# Patient Record
Sex: Female | Born: 1950 | Race: White | Hispanic: No | Smoking: Current every day smoker
Health system: Southern US, Community
[De-identification: ages and names within clinical notes are randomized; demographics above are authoritative.]

## PROBLEM LIST (undated history)

## (undated) DIAGNOSIS — J449 Chronic obstructive pulmonary disease, unspecified: Secondary | ICD-10-CM

## (undated) DIAGNOSIS — E781 Pure hyperglyceridemia: Secondary | ICD-10-CM

## (undated) DIAGNOSIS — I714 Abdominal aortic aneurysm, without rupture, unspecified: Secondary | ICD-10-CM

## (undated) DIAGNOSIS — I5181 Takotsubo syndrome: Secondary | ICD-10-CM

## (undated) DIAGNOSIS — R0600 Dyspnea, unspecified: Secondary | ICD-10-CM

## (undated) HISTORY — DX: Pure hyperglyceridemia: E78.1

## (undated) HISTORY — DX: Abdominal aortic aneurysm, without rupture, unspecified: I71.40

## (undated) HISTORY — DX: Chronic obstructive pulmonary disease, unspecified: J44.9

## (undated) HISTORY — DX: Abdominal aortic aneurysm, without rupture: I71.4

## (undated) HISTORY — DX: Takotsubo syndrome: I51.81

## (undated) HISTORY — PX: TOE SURGERY: SHX1073

---

## 2005-03-13 HISTORY — PX: OTHER SURGICAL HISTORY: SHX169

## 2013-08-25 DIAGNOSIS — F17200 Nicotine dependence, unspecified, uncomplicated: Secondary | ICD-10-CM | POA: Diagnosis not present

## 2013-08-25 DIAGNOSIS — Z0000 Encounter for general adult medical examination without abnormal findings: Secondary | ICD-10-CM | POA: Diagnosis not present

## 2013-08-25 DIAGNOSIS — J441 Chronic obstructive pulmonary disease with (acute) exacerbation: Secondary | ICD-10-CM | POA: Diagnosis not present

## 2013-08-25 DIAGNOSIS — E781 Pure hyperglyceridemia: Secondary | ICD-10-CM | POA: Diagnosis not present

## 2013-08-25 DIAGNOSIS — J449 Chronic obstructive pulmonary disease, unspecified: Secondary | ICD-10-CM | POA: Diagnosis not present

## 2013-11-24 DIAGNOSIS — J449 Chronic obstructive pulmonary disease, unspecified: Secondary | ICD-10-CM | POA: Diagnosis not present

## 2013-11-24 DIAGNOSIS — E781 Pure hyperglyceridemia: Secondary | ICD-10-CM | POA: Diagnosis not present

## 2013-12-16 DIAGNOSIS — Z720 Tobacco use: Secondary | ICD-10-CM | POA: Diagnosis not present

## 2013-12-16 DIAGNOSIS — J410 Simple chronic bronchitis: Secondary | ICD-10-CM | POA: Diagnosis not present

## 2013-12-16 DIAGNOSIS — F17210 Nicotine dependence, cigarettes, uncomplicated: Secondary | ICD-10-CM | POA: Diagnosis not present

## 2014-06-01 DIAGNOSIS — J449 Chronic obstructive pulmonary disease, unspecified: Secondary | ICD-10-CM | POA: Diagnosis not present

## 2014-06-01 DIAGNOSIS — E781 Pure hyperglyceridemia: Secondary | ICD-10-CM | POA: Diagnosis not present

## 2014-06-01 DIAGNOSIS — Z681 Body mass index (BMI) 19 or less, adult: Secondary | ICD-10-CM | POA: Diagnosis not present

## 2014-06-01 DIAGNOSIS — Z79899 Other long term (current) drug therapy: Secondary | ICD-10-CM | POA: Diagnosis not present

## 2014-12-02 DIAGNOSIS — E781 Pure hyperglyceridemia: Secondary | ICD-10-CM | POA: Diagnosis not present

## 2014-12-02 DIAGNOSIS — R636 Underweight: Secondary | ICD-10-CM | POA: Diagnosis not present

## 2014-12-02 DIAGNOSIS — J449 Chronic obstructive pulmonary disease, unspecified: Secondary | ICD-10-CM | POA: Diagnosis not present

## 2014-12-02 DIAGNOSIS — R413 Other amnesia: Secondary | ICD-10-CM | POA: Diagnosis not present

## 2014-12-02 DIAGNOSIS — Z1389 Encounter for screening for other disorder: Secondary | ICD-10-CM | POA: Diagnosis not present

## 2015-02-15 DIAGNOSIS — Z23 Encounter for immunization: Secondary | ICD-10-CM | POA: Diagnosis not present

## 2015-02-15 DIAGNOSIS — E781 Pure hyperglyceridemia: Secondary | ICD-10-CM | POA: Diagnosis not present

## 2015-02-15 DIAGNOSIS — R413 Other amnesia: Secondary | ICD-10-CM | POA: Diagnosis not present

## 2015-02-15 DIAGNOSIS — Z87891 Personal history of nicotine dependence: Secondary | ICD-10-CM | POA: Diagnosis not present

## 2015-02-15 DIAGNOSIS — R636 Underweight: Secondary | ICD-10-CM | POA: Diagnosis not present

## 2015-02-15 DIAGNOSIS — J449 Chronic obstructive pulmonary disease, unspecified: Secondary | ICD-10-CM | POA: Diagnosis not present

## 2015-02-15 DIAGNOSIS — Z720 Tobacco use: Secondary | ICD-10-CM | POA: Diagnosis not present

## 2015-02-15 DIAGNOSIS — Z9181 History of falling: Secondary | ICD-10-CM | POA: Diagnosis not present

## 2015-02-15 DIAGNOSIS — Z681 Body mass index (BMI) 19 or less, adult: Secondary | ICD-10-CM | POA: Diagnosis not present

## 2015-02-15 DIAGNOSIS — Z0000 Encounter for general adult medical examination without abnormal findings: Secondary | ICD-10-CM | POA: Diagnosis not present

## 2015-02-17 ENCOUNTER — Encounter: Payer: Self-pay | Admitting: Psychology

## 2015-02-17 ENCOUNTER — Ambulatory Visit: Payer: Medicare Other | Attending: Psychology | Admitting: Psychology

## 2015-02-17 DIAGNOSIS — F32A Depression, unspecified: Secondary | ICD-10-CM | POA: Insufficient documentation

## 2015-02-17 DIAGNOSIS — F329 Major depressive disorder, single episode, unspecified: Secondary | ICD-10-CM

## 2015-02-17 DIAGNOSIS — G3184 Mild cognitive impairment, so stated: Secondary | ICD-10-CM

## 2015-02-17 HISTORY — DX: Mild cognitive impairment of uncertain or unknown etiology: G31.84

## 2015-02-17 HISTORY — DX: Major depressive disorder, single episode, unspecified: F32.9

## 2015-02-17 HISTORY — DX: Depression, unspecified: F32.A

## 2015-02-17 NOTE — Progress Notes (Signed)
[facility]  [address]   Telephone [phone] [address] Fax [phone]) [phone] [place], [place] [address]  Initial Contact Note  Name:  Alexandra Mays Date of Birth; 08/30/1950 MRN:  [id number] Date:  02/17/2015  Alexandra Mays is an 64 y.o. female who was referred for neuropsychological evaluation by Tanna FurryBrittany Hout, PA-C of [facility] due to concerns about memory loss.   A total of 4 hours was spent today reviewing medical records, interviewing (CPT [id number]) Alexandra Mays and her daughter, administering and scoring neurocognitive tests, and preparing a written report (CPTs [phone] & [phone]).  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   A full report will follow once the results and recomemdations are discussed with her. Her next appointment is scheduled for 02/24/15.    Gladstone PihMichael F. Maruice Pieroni, Ph.D Licensed Psychologist 02/17/2015

## 2015-02-24 ENCOUNTER — Encounter: Payer: Self-pay | Admitting: Psychology

## 2015-02-24 ENCOUNTER — Ambulatory Visit (INDEPENDENT_AMBULATORY_CARE_PROVIDER_SITE_OTHER): Payer: Medicare Other | Admitting: Psychology

## 2015-02-24 DIAGNOSIS — G3184 Mild cognitive impairment, so stated: Secondary | ICD-10-CM

## 2015-02-24 DIAGNOSIS — F32A Depression, unspecified: Secondary | ICD-10-CM

## 2015-02-24 DIAGNOSIS — F329 Major depressive disorder, single episode, unspecified: Secondary | ICD-10-CM

## 2015-02-24 NOTE — Progress Notes (Addendum)
[facility]  [address]   Telephone [phone] [address] Fax [phone]) [phone] [place], [place] [address]   NEUROPSYCHOLOGICAL EVALUATION  *CONFIDENTIAL* This report should not be released without the consent of the client  Name:   Alexandra LeanPatricia A. Mays  Date of Birth:  Apr 07, 2050 [facility] MR#:  [id number] Dates of Evaluation: 02/17/15 & 02/24/15  Reason for Referral Alexandra HashimotoPatricia Dennie Mays(Pat) Alexandra Mays is a 64 year-old right-handed woman who was referred for neuropsychological evaluation by Tanna FurryBrittany Hout PA-C of [facility] as prompted by concerns about her memory.   Sources of Information Records from [name] and electronic medical records from [facility] were reviewed. Ms. Alexandra Mays and her granddaughter, Ms. Alexandra Mays, were interviewed.   Chief Complaints Ms. Alexandra Mays was described by her granddaughter (with whom she lives) has having shown memory problems over the past year that have gradually worsened. Examples included difficulties with recent memory (e.g., unable to recall more than one item at a grocery store, forgetting details of recent conversations), problems with word finding, getting lost while driving and a tendency to conflate details of different events that occurred many years ago. She stated that she has been initiating and completing household chores though in a less precise way than typical for her. She reported that her grandmother's handwriting has appeared less legible. She described her grandmother as having become "grumpy" within the past year. She has shown little interest in living her home and has been avoidant of social gatherings. She has not seemed confused nor exhibited any unusual or unsafe behavior. She continues to trust her with babysitting her children.  There was no report of any events, changes in health, life stressors or social changes that preceded or were coincident to the onset of her memory difficulties.   Ms.  Alexandra Mays stated her belief that her memory is normal for her age. She reported that she has had no problems with preparing meals, taking her medications as prescribed or paying her bills. She did not report any recent changes in her health. She denied problems with eye-hand coordination, balance, limb strength, gait, sleep, daytime alertness, vision, hearing, swallowing, speech, smell, taste or appetite. She did not report being in any pain. When asked about her mood, she described herself as dissatisfied in life, in part due to a sense of purposelessness. She stated that there is nothing that she looks forward to other than watching [employer] and taking care of her grandchildren. She would not consider herself to be sad or anxious. She stated that she spends most of her time watching television. She agreed that she tends to avoid social gatherings because "I feel like I'm intruding". She denied having had suicidal thoughts but stated that most days she does not care if she would die. She denied experiencing hallucinations or delusional ideas.  Background Ms. Alexandra Mays moved in with her granddaughter in 2013 at her granddaughter's request, primarily to help watch her grandchildren. Ms. Alexandra Mays had been living alone since the death of her husband in 2004. She has three children and six grandchildren.   Her past medical history was notable for chronic obstructive pulmonary disease, hyperlipidemia and a myocardial infarction in 2007 (according to Ms. Alexandra Mays). She reported no history of head injury, loss of consciousness, seizure activity, stroke-like symptoms or exposure to toxic chemicals.  Her current medications consist of daily use of inhalers for COPD.  She reported no history of alcohol abuse or use of illicit drugs. Despite having COPD, she  continues to smoke about  packs of cigarettes per day. She has been a smoker since the age of 1.  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  She reported that she dropped out of high school in the eleventh grade to get married. She described herself as a good student without problems with attention or learning.  She reported no prior history of mental health contacts or use of psychiatric medication.  She reported no family history of memory disorder or dementia though her mother died from a stroke when only in her thirties.   Observations She appeared as an appropriately dressed and groomed slender woman in no apparent physical distress. She interacted in a consistently pleasant and cooperative manner. She spoke in a normal tone of voice, maintained good eye contact and responded to all questions. Her affect appeared constricted in range. She did not display signs of emotional distress. Her thought processes were coherent and organized without loose associations, verbal perseverations or flight of ideas. Her thought content was devoid of unusual or bizarre ideas.  Evaluation Procedures In addition to review of medical records and interviews, the following tests or questionnaires were administered:  Animal Naming Test [employer] Clock Drawing Test Controlled Oral Word Association Test Geriatric Depression Scale Rey Complex Figure: copy Trail Making A & B Wechsler Adult Intelligence Scale-IV:  Clinical cytogeneticist, Coding, Digit Span, Matrix Reasoning & Similarities   Wechsler Memory Scale-IV Older Adult Battery Wide Range Achievement Test-4: Word Reading [place] Card Sorting Test  Assessment Results Validity & Interpretative Considerations  Test results were deemed to represent a valid measure of her current cognitive functioning.  She presented as alert, calm and cooperative. She did not report or display problems with vision (she wore her eyeglasses), hearing or motor control. There were no signs of inadequate effort.    Her baseline intellectual potential was estimated to fall  within the Average range based on demographic factors coupled with a measure of word reading (Wide Range Achievement Test-4) that represents an over-learned verbal skill relatively resistant to the effects of neurological disorder or injury.  Her test scores were corrected to reflect norms for her age and, whenever possible, her gender and educational level (i.e., 11 years). A listing of test results can be found at the end of this report.  Orientation She was oriented to the month, year and day of the week but not to the date. She was oriented to self and place.    Attentional & Executive Functions Her speed to transcribe symbols to match digits using a key (Wechsler Adult Intelligence Scale-IV (WAIS-IV) Coding) fell within the Average range. Her speed to draw lines connecting numbers randomly arrayed on a page in numerical sequence (Trials A) fell within the Low Average range.   Her speed on a complex attentional task that required mental tracking and set shifting (Trails B) in order to complete an ascending and alternating visual sequence of numbers and letters was within the Average range.   Her capacity to hold and manipulate information in working memory was within normal expectations on tasks that required her to mentally rearranging digit sequences in reverse or in ascending numerical order (WAIS-IV Digit Span) or immediately recognize symbols in left to right order (Wechsler Memory Scale-IV (WMS-IV Symbol Span).    Her abilities to generate words to designated letters (Controlled Oral Word Association Test) or name members of a category Building services engineer Test)  were within the Low Average range.  Her performances on measures of verbal reasoning (WAIS-IV Similarities) and nonverbal reasoning (WAIS-IV Matrix Reasoning) fell within the Average range.  She performed mostly within the expected range on a test of nonverbal problem-solving and conceptual flexibility that required inferring logical  ways to sort geometric designs on the basis of ongoing feedback [employer]). On the positive side, she quickly inferred the initial sorting principle, deduced all possible sorting ideas, made an average number of errors and adaptively shifted to a new strategy as necessary. In contrast, she failed to maintain set (i.e., deviated from a correct sorting strategy) more than an average number of times.  Learning & Memory On the WMS-IV, her Immediate Memory Index (IMI), a composite measure of her ability to recall verbal and visual information immediately after the stimuli was presented, fell within the Low Average range at the 13th percentile. Her immediate memory subtest scores varied from the 9th to 25th percentiles. Her Delayed Memory Index (DMI), a combined measure of her ability to recall verbal and visual information after a 20 to 30 minute delay, fell within the impaired range at below the 1st percentile. Her DMI was much lower than expected given her IMI, as she demonstrated abnormal forgetting of previously learned auditory and visual information over the course of the delay interval. For example she recalled only 15% of narrative passages and 7% of figural designs. A recognition format (i.e., multiple choices or yes/no questions) did somewhat aid her recall of figural designs but did not help her recall auditory information.    Language Her ability to name drawings of objects to confrontation [facility] [employer]) was within the High Average range. As noted above, a measure of her letter fluency was within the Low Average range. A measure of word reading (Wide Range Achievement Test-4) fell within the Average range.   Visual-Spatial Organization & Visual-Construction  There were no signs of spatial inattention or problems with visual recognition. Her ability to assemble two-dimensional block designs from models Counsellor) was within the Average range. Her copy of a  spatially-complex geometric design (Rey Complex Figure) was recognizable though marred by a number of distortions, omissions or misplacements of internal design features. Her clock drawing was normal.  Emotional Status Her score of 15/30 on the Geriatric Depression Scale fell within the mild range. She endorsed symptoms reflective of discontent, social withdrawal, hopelessness, helplessness, worthlessness and low energy level.    Summary & Conclusions Alexandra Mays is a 64 year-old woman who was described by her granddaughter (with whom she lives) as having shown gradually worsening memory problems over the past year. Her memory problems included difficulties with recent memory, driving navigation and a tendency to conflate details of past events. Ms. Nakama stated her belief her memory is normal for her age.   Neuropsychological testing identified a serious memory deficit, primarily characterized by abnormal forgetting after a delay. In addition, her problem solving ability was somewhat reduced by difficulty keeping track of her ideas in working memory. Her performance on a measure of semantic fluency was lower than expected at barely within the Low Average range. On the positive side, she performed within normal expectations on tests of focused visual attention, working memory span, Nature conservation officer, visual-spatial organization and abstract reasoning.   Assessment of her psychological functioning indicated a depressive disorder characterized by a sense of purposelessness in life, lowered sense of self-worth, loss of interests, social withdrawal and passive wishes to die. It  is likely that she has felt this way for at least the past few years.  In conclusion, the main question is whether her cognitive decline is secondary to depression or if her depression is co-morbid with an incipient early-onset dementia process. The fact that her auditory memory was deficient at the stage of storage rather than  retrieval would be less typical of the effects of depression on memory.   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 It is advised to start her on antidepressant medication, if medically appropriate, to see whether her mood and cognitive functioning might subsequently improve. She was not interested in seeking psychological counseling.  2. Neuroradiogical studies are recommended for clinical correlation. Referral to neurology could be considered for further work-up.  3. It would be prudent for her granddaughter to manage Ms. Hassan finances for the time being.  4. She was encouraged to stop smoking though as might be expected she has heard this many times from many people. She was not interested in pursuing smoking cessation strategies.   5. The current test results comprise a baseline of her cognitive functioning. A repeat neuropsychological evaluation should be considered in one year to eighteen months (or sooner if clinically indicated) to track interval changes, if any, in her cognitive or emotional functioning.   The conclusions and recommendations from this evaluation were discussed with Ms. Harral and her granddaughter on 02/24/15. Ms. Esquivias reactions to this feedback vacillated between acceptance and denial of her mood and memory problems.   I have appreciated the opportunity to evaluate Ms. Oddo Please feel free to contact me with any comments or questions.      ______________________ Gladstone Pih, Ph.D Licensed Psychologist       Copy to: Tanna Furry, PA-C       ADDENDUM-NEUROPSYCHOLOGICAL TEST RESULTS  Her test scores were corrected to reflect norms for her age and, whenever possible, her gender and educational level (i.e., 11 years).   Animal Naming Test Score= 12   9th (adjusted for age, gender and educational level)   [place] Naming Test Score=57/60  84th (adjusted for age, gender and educational level)   Controlled Oral Word Association Test Score=  27 words/1 repetition 18th (adjusted for age, gender and educational level)   Rey Complex Figure: copy       Score= 28/36  Inattentiveness to details, distortions, misplacements   Trails A Score= 56s 02e 13th (adjusted for age, gender and educational level)  Trails B Score= 84s 1e 70th (adjusted for age, gender and educational level)   Wechsler Adult Intelligence Scale-IV   Subtest Scaled Score Percentile  Block Design   9 37th      Similarities   9 37th    Digit Span  Forward               Backward               Sequencing 84th    63rd     95th   50th         Matrix Reasoning   8 25th     Coding   10 50th        Wechsler Memory Scale-IV Older Adult Battery  Index Index Score Percentile  Immediate Memory  83 13th     Auditory Memory  72   3rd        Visual Memory  76   5th  Delayed Memory  58 <1st        Symbol Span Scaled score= 9 37th       Wide Range Achievement Test-4 Subtest  Raw score Standard score Percentile  Word Reading 59/70   99   47th    Wisconsin Card Sorting Test  Total errors=  35   58th (adjusted for age and education)  Perseverative errors=  17   66th       Categories=    6 >16th     Trials to first category=  11 >16th    Failure to maintain set   2 11th - 16th    Learning to learn=   0.15 >16th

## 2015-03-16 DIAGNOSIS — J432 Centrilobular emphysema: Secondary | ICD-10-CM | POA: Diagnosis not present

## 2015-03-16 DIAGNOSIS — Z122 Encounter for screening for malignant neoplasm of respiratory organs: Secondary | ICD-10-CM | POA: Diagnosis not present

## 2015-03-16 DIAGNOSIS — F17210 Nicotine dependence, cigarettes, uncomplicated: Secondary | ICD-10-CM | POA: Diagnosis not present

## 2015-03-16 DIAGNOSIS — R918 Other nonspecific abnormal finding of lung field: Secondary | ICD-10-CM | POA: Diagnosis not present

## 2015-03-16 DIAGNOSIS — I2510 Atherosclerotic heart disease of native coronary artery without angina pectoris: Secondary | ICD-10-CM | POA: Diagnosis not present

## 2015-03-16 DIAGNOSIS — J438 Other emphysema: Secondary | ICD-10-CM | POA: Diagnosis not present

## 2015-03-16 DIAGNOSIS — R928 Other abnormal and inconclusive findings on diagnostic imaging of breast: Secondary | ICD-10-CM | POA: Diagnosis not present

## 2015-03-16 DIAGNOSIS — F17219 Nicotine dependence, cigarettes, with unspecified nicotine-induced disorders: Secondary | ICD-10-CM | POA: Diagnosis not present

## 2015-03-16 DIAGNOSIS — Z1231 Encounter for screening mammogram for malignant neoplasm of breast: Secondary | ICD-10-CM | POA: Diagnosis not present

## 2015-03-29 DIAGNOSIS — E785 Hyperlipidemia, unspecified: Secondary | ICD-10-CM | POA: Insufficient documentation

## 2015-03-29 DIAGNOSIS — F17210 Nicotine dependence, cigarettes, uncomplicated: Secondary | ICD-10-CM

## 2015-03-29 DIAGNOSIS — I25119 Atherosclerotic heart disease of native coronary artery with unspecified angina pectoris: Secondary | ICD-10-CM

## 2015-03-29 HISTORY — DX: Atherosclerotic heart disease of native coronary artery with unspecified angina pectoris: I25.119

## 2015-03-29 HISTORY — DX: Nicotine dependence, cigarettes, uncomplicated: F17.210

## 2015-03-29 HISTORY — DX: Hyperlipidemia, unspecified: E78.5

## 2015-03-31 DIAGNOSIS — N6002 Solitary cyst of left breast: Secondary | ICD-10-CM | POA: Diagnosis not present

## 2015-03-31 DIAGNOSIS — R928 Other abnormal and inconclusive findings on diagnostic imaging of breast: Secondary | ICD-10-CM | POA: Diagnosis not present

## 2015-03-31 DIAGNOSIS — N63 Unspecified lump in breast: Secondary | ICD-10-CM | POA: Diagnosis not present

## 2015-04-01 DIAGNOSIS — I25119 Atherosclerotic heart disease of native coronary artery with unspecified angina pectoris: Secondary | ICD-10-CM | POA: Diagnosis not present

## 2015-04-01 DIAGNOSIS — E785 Hyperlipidemia, unspecified: Secondary | ICD-10-CM | POA: Diagnosis not present

## 2015-04-01 DIAGNOSIS — F17210 Nicotine dependence, cigarettes, uncomplicated: Secondary | ICD-10-CM | POA: Diagnosis not present

## 2015-04-06 DIAGNOSIS — E785 Hyperlipidemia, unspecified: Secondary | ICD-10-CM | POA: Diagnosis not present

## 2015-04-06 DIAGNOSIS — I25119 Atherosclerotic heart disease of native coronary artery with unspecified angina pectoris: Secondary | ICD-10-CM | POA: Diagnosis not present

## 2015-04-06 DIAGNOSIS — I714 Abdominal aortic aneurysm, without rupture: Secondary | ICD-10-CM | POA: Diagnosis not present

## 2015-04-06 DIAGNOSIS — R109 Unspecified abdominal pain: Secondary | ICD-10-CM | POA: Diagnosis not present

## 2015-04-06 DIAGNOSIS — F17210 Nicotine dependence, cigarettes, uncomplicated: Secondary | ICD-10-CM | POA: Diagnosis not present

## 2015-04-12 ENCOUNTER — Encounter: Payer: Self-pay | Admitting: Vascular Surgery

## 2015-04-13 DIAGNOSIS — E785 Hyperlipidemia, unspecified: Secondary | ICD-10-CM | POA: Diagnosis not present

## 2015-04-13 DIAGNOSIS — I25119 Atherosclerotic heart disease of native coronary artery with unspecified angina pectoris: Secondary | ICD-10-CM | POA: Diagnosis not present

## 2015-04-13 DIAGNOSIS — F17210 Nicotine dependence, cigarettes, uncomplicated: Secondary | ICD-10-CM | POA: Diagnosis not present

## 2015-04-16 ENCOUNTER — Encounter: Payer: Self-pay | Admitting: Vascular Surgery

## 2015-04-21 ENCOUNTER — Ambulatory Visit (INDEPENDENT_AMBULATORY_CARE_PROVIDER_SITE_OTHER): Payer: Medicare Other | Admitting: Vascular Surgery

## 2015-04-21 ENCOUNTER — Encounter: Payer: Self-pay | Admitting: Vascular Surgery

## 2015-04-21 VITALS — BP 138/73 | HR 70 | Temp 96.6°F | Resp 14 | Ht 63.0 in | Wt 97.0 lb

## 2015-04-21 DIAGNOSIS — I714 Abdominal aortic aneurysm, without rupture, unspecified: Secondary | ICD-10-CM

## 2015-04-21 DIAGNOSIS — F17210 Nicotine dependence, cigarettes, uncomplicated: Secondary | ICD-10-CM

## 2015-04-21 HISTORY — DX: Abdominal aortic aneurysm, without rupture, unspecified: I71.40

## 2015-04-21 HISTORY — DX: Abdominal aortic aneurysm, without rupture: I71.4

## 2015-04-21 NOTE — Progress Notes (Signed)
[facility] [place] HISTORY AND PHYSICAL   Referring Physician: Tanna Furry PA-c History of Present Illness:  Patient is a 65 y.o. female who presents for evaluation of asymptomatic abdominal aortic aneurysm. The patient recently had an ultrasound of her aorta after a pulsatile mass was found on exam by Dr. Tomie China. She denies any family history of abdominal aortic aneurysm. She denies any back pain or abdominal pain. She currently smokes 1 pack cigarettes per day but is trying to quit. Other medical problems include hyperlipidemia and COPD. She does have a chronic cough. Patient's history does list that she has had coronary stenting in the past.  Past Medical History  Diagnosis Date  . COPD (chronic obstructive pulmonary disease) (HCC)   . Pure hyperglyceridemia   . AAA (abdominal aortic aneurysm) [facility]     Past Surgical History  Procedure Laterality Date  . Cardiac stenting  2007  . Neck surgery      Social History Social History  Substance Use Topics  . Smoking status: Current Every Day Smoker -- 1.00 packs/day for 50 years    Types: Cigarettes  . Smokeless tobacco: Never Used  . Alcohol Use: No    Family History Family History  Problem Relation Age of Onset  . Stroke Mother     Allergies  No Known Allergies   Current Outpatient Prescriptions  Medication Sig Dispense Refill  . albuterol (PROVENTIL HFA;VENTOLIN HFA) 108 (90 Base) MCG/ACT inhaler Inhale 2 puffs into the lungs every 6 (six) hours as needed for wheezing or shortness of breath.    . Fluticasone Furoate-Vilanterol (BREO ELLIPTA) 100-25 MCG/INH AEPB Inhale into the lungs daily.    . simvastatin (ZOCOR) 20 MG tablet Take 20 mg by mouth daily.    . Tiotropium Bromide Monohydrate (SPIRIVA RESPIMAT) 2.5 MCG/ACT AERS Inhale 2 puffs into the lungs daily.    Marland Kitchen albuterol (PROAIR HFA) 108 (90 Base) MCG/ACT inhaler Inhale into the lungs.    . Fluticasone Furoate-Vilanterol 100-25 MCG/INH AEPB  Inhale into the lungs.    . simvastatin (ZOCOR) 20 MG tablet Take 20 mg by mouth.    . Tiotropium Bromide Monohydrate (SPIRIVA RESPIMAT) 2.5 MCG/ACT AERS Inhale into the lungs.     No current facility-administered medications for this visit.    ROS:   General:  No weight loss, Fever, chills  HEENT: No recent headaches, no nasal bleeding, no visual changes, no sore throat  Neurologic: No dizziness, blackouts, seizures. No recent symptoms of stroke or mini- stroke. No recent episodes of slurred speech, or temporary blindness.  Cardiac: No recent episodes of chest pain/pressure, no shortness of breath at rest.  + shortness of breath with exertion.  Denies history of atrial fibrillation or irregular heartbeat  Vascular: No history of rest pain in feet.  No history of claudication.  No history of non-healing ulcer, No history of DVT   Pulmonary: No home oxygen, + productive cough, no hemoptysis,  No asthma or wheezing  Musculoskeletal:   Arthritis,  Low back pain,   Joint pain  Hematologic:No history of hypercoagulable state.  No history of easy bleeding.  No history of anemia  Gastrointestinal: No hematochezia or melena,  No gastroesophageal reflux, no trouble swallowing  Urinary:  chronic Kidney disease,  on HD -  MWF or  TTHS,  Burning with urination,  Frequent urination,  Difficulty urinating;   Skin: No rashes  Psychological: + history of anxiety, +  history of depression   Physical Examination  Filed Vitals:   04/21/15 1033  BP: 138/73  Pulse: 70  Temp: 96.6 F (35.9 C)  Resp: 14  Height:  (1.6 m)  Weight: 97 lb (43.999 kg)  SpO2: 99%    Body mass index is 17.19 kg/(m^2).  General:  Alert and oriented, no acute distress HEENT: Normal Neck: No bruit or JVD Pulmonary: Clear to auscultation bilaterally Cardiac: Regular Rate and Rhythm without murmur Abdomen: Soft, non-tender, non-distended, pulsatile mass in the epigastrium just  to the left of midline nontender Skin: No rash Extremity Pulses:  2+ radial, brachial, femoral, dorsalis pedis, posterior tibial pulses bilaterally Musculoskeletal: No deformity or edema  Neurologic: Upper and lower extremity motor 5/5 and symmetric  DATA:  Ultrasound report dated 04/06/2015 from [facility] radiology is reviewed. This shows a 4.7 x 4.7 cm abdominal aortic aneurysm. There was no iliac artery component listed.   ASSESSMENT:  Asymptomatic 4.7 cm abdominal aortic aneurysm. Pathophysiology of aneurysms was discussed with the patient and her granddaughter today. I also discussed with them that she has any severe abdominal or back pain she should let the emergency room no she has an abdominal aortic aneurysm. Risk of rupture for an aneurysm less than 5-5-1/2 cm in diameter is less than 0.5% per year. Usually in this case we get surveillance ultrasound every 6 months unless the aneurysm begins to grow then we would consider repair at that point.   PLAN:  Patient will follow-up with me with a repeat aortic ultrasound in 6 months. We'll try to quit smoking. Greater than 3 minutes today spent regarding smoking cessation counseling.  Fabienne Bruns, MD [facility] [place] Office: [phone] Pager: [phone]

## 2015-04-21 NOTE — Addendum Note (Signed)
Addended by: Adria Dill L on: 04/21/2015 06:05 PM   Modules accepted: Orders

## 2015-06-01 DIAGNOSIS — E785 Hyperlipidemia, unspecified: Secondary | ICD-10-CM | POA: Diagnosis not present

## 2015-06-01 DIAGNOSIS — Z681 Body mass index (BMI) 19 or less, adult: Secondary | ICD-10-CM | POA: Diagnosis not present

## 2015-06-01 DIAGNOSIS — J449 Chronic obstructive pulmonary disease, unspecified: Secondary | ICD-10-CM | POA: Diagnosis not present

## 2015-06-01 DIAGNOSIS — R413 Other amnesia: Secondary | ICD-10-CM | POA: Diagnosis not present

## 2015-06-03 DIAGNOSIS — R413 Other amnesia: Secondary | ICD-10-CM | POA: Diagnosis not present

## 2015-06-03 DIAGNOSIS — R930 Abnormal findings on diagnostic imaging of skull and head, not elsewhere classified: Secondary | ICD-10-CM | POA: Diagnosis not present

## 2015-06-16 DIAGNOSIS — R918 Other nonspecific abnormal finding of lung field: Secondary | ICD-10-CM | POA: Diagnosis not present

## 2015-06-16 DIAGNOSIS — F17200 Nicotine dependence, unspecified, uncomplicated: Secondary | ICD-10-CM | POA: Diagnosis not present

## 2015-06-16 DIAGNOSIS — Z87891 Personal history of nicotine dependence: Secondary | ICD-10-CM | POA: Diagnosis not present

## 2015-06-16 DIAGNOSIS — F17210 Nicotine dependence, cigarettes, uncomplicated: Secondary | ICD-10-CM | POA: Diagnosis not present

## 2015-07-21 DIAGNOSIS — F329 Major depressive disorder, single episode, unspecified: Secondary | ICD-10-CM | POA: Diagnosis not present

## 2015-07-21 DIAGNOSIS — R413 Other amnesia: Secondary | ICD-10-CM | POA: Diagnosis not present

## 2015-10-21 ENCOUNTER — Ambulatory Visit: Payer: Medicare Other | Admitting: Vascular Surgery

## 2015-10-21 ENCOUNTER — Other Ambulatory Visit (HOSPITAL_COMMUNITY): Payer: Medicare Other

## 2015-11-26 ENCOUNTER — Encounter: Payer: Self-pay | Admitting: Vascular Surgery

## 2015-12-02 ENCOUNTER — Ambulatory Visit (HOSPITAL_COMMUNITY)
Admission: RE | Admit: 2015-12-02 | Discharge: 2015-12-02 | Disposition: A | Discharge status: Home / Self Care | Payer: Medicare Other | Source: Ambulatory Visit | Attending: Vascular Surgery | Admitting: Vascular Surgery

## 2015-12-02 ENCOUNTER — Ambulatory Visit: Payer: Medicare Other | Admitting: Vascular Surgery

## 2015-12-02 ENCOUNTER — Other Ambulatory Visit (HOSPITAL_COMMUNITY): Payer: Medicare Other

## 2015-12-02 ENCOUNTER — Ambulatory Visit (INDEPENDENT_AMBULATORY_CARE_PROVIDER_SITE_OTHER): Payer: Medicare Other | Admitting: Family

## 2015-12-02 ENCOUNTER — Encounter: Payer: Self-pay | Admitting: Family

## 2015-12-02 VITALS — BP 119/71 | HR 80 | Temp 96.8°F | Resp 12 | Ht 64.0 in | Wt 96.1 lb

## 2015-12-02 DIAGNOSIS — I714 Abdominal aortic aneurysm, without rupture, unspecified: Secondary | ICD-10-CM

## 2015-12-02 DIAGNOSIS — Z720 Tobacco use: Secondary | ICD-10-CM

## 2015-12-02 DIAGNOSIS — F17200 Nicotine dependence, unspecified, uncomplicated: Secondary | ICD-10-CM

## 2015-12-02 NOTE — Patient Instructions (Addendum)
Abdominal Aortic Aneurysm An aneurysm is a weakened or damaged part of an artery wall that bulges from the normal force of blood pumping through the body. An abdominal aortic aneurysm is an aneurysm that occurs in the lower part of the aorta, the main artery of the body.  The major concern with an abdominal aortic aneurysm is that it can enlarge and burst (rupture) or blood can flow between the layers of the wall of the aorta through a tear (aorticdissection). Both of these conditions can cause bleeding inside the body and can be life threatening unless diagnosed and treated promptly. CAUSES  The exact cause of an abdominal aortic aneurysm is unknown. Some contributing factors are:   A hardening of the arteries caused by the buildup of fat and other substances in the lining of a blood vessel (arteriosclerosis).  Inflammation of the walls of an artery (arteritis).   Connective tissue diseases, such as Marfan syndrome.   Abdominal trauma.   An infection, such as syphilis or staphylococcus, in the wall of the aorta (infectious aortitis) caused by bacteria. RISK FACTORS  Risk factors that contribute to an abdominal aortic aneurysm may include:  Age older than 60 years.   High blood pressure (hypertension).  Female gender.  Ethnicity (white race).  Obesity.  Family history of aneurysm (first degree relatives only).  Tobacco use. PREVENTION  The following healthy lifestyle habits may help decrease your risk of abdominal aortic aneurysm:  Quitting smoking. Smoking can raise your blood pressure and cause arteriosclerosis.  Limiting or avoiding alcohol.  Keeping your blood pressure, blood sugar level, and cholesterol levels within normal limits.  Decreasing your salt intake. In somepeople, too much salt can raise blood pressure and increase your risk of abdominal aortic aneurysm.  Eating a diet low in saturated fats and cholesterol.  Increasing your fiber intake by including  whole grains, vegetables, and fruits in your diet. Eating these foods may help lower blood pressure.  Maintaining a healthy weight.  Staying physically active and exercising regularly. SYMPTOMS  The symptoms of abdominal aortic aneurysm may vary depending on the size and rate of growth of the aneurysm.Most grow slowly and do not have any symptoms. When symptoms do occur, they may include:  Pain (abdomen, side, lower back, or groin). The pain may vary in intensity. A sudden onset of severe pain may indicate that the aneurysm has ruptured.  Feeling full after eating only small amounts of food.  Nausea or vomiting or both.  Feeling a pulsating lump in the abdomen.  Feeling faint or passing out. DIAGNOSIS  Since most unruptured abdominal aortic aneurysms have no symptoms, they are often discovered during diagnostic exams for other conditions. An aneurysm may be found during the following procedures:  Ultrasonography (A one-time screening for abdominal aortic aneurysm by ultrasonography is also recommended for all men aged 65-75 years who have ever smoked).  X-ray exams.  A computed tomography (CT).  Magnetic resonance imaging (MRI).  Angiography or arteriography. TREATMENT  Treatment of an abdominal aortic aneurysm depends on the size of your aneurysm, your age, and risk factors for rupture. Medication to control blood pressure and pain may be used to manage aneurysms smaller than 6 cm. Regular monitoring for enlargement may be recommended by your caregiver if:  The aneurysm is 3-4 cm in size (an annual ultrasonography may be recommended).  The aneurysm is 4-4.5 cm in size (an ultrasonography every 6 months may be recommended).  The aneurysm is larger than 4.5 cm in   size (your caregiver may ask that you be examined by a vascular surgeon). If your aneurysm is larger than 6 cm, surgical repair may be recommended. There are two main methods for repair of an aneurysm:   Endovascular  repair (a minimally invasive surgery). This is done most often.  Open repair. This method is used if an endovascular repair is not possible.   This information is not intended to replace advice given to you by your health care provider. Make sure you discuss any questions you have with your health care provider.   Document Released: 12/07/2004 Document Revised: 06/24/2012 Document Reviewed: 03/29/2012 Elsevier Interactive Patient Education [employer]    Before your next abdominal ultrasound:  Take two Extra-Strength Gas-X capsules at bedtime the night before the test. Take another two Extra-Strength Gas-X capsules 3 hours before the test.      Steps to Quit Smoking  Smoking tobacco can be harmful to your health and can affect almost every organ in your body. Smoking puts you, and those around you, at risk for developing many serious chronic diseases. Quitting smoking is difficult, but it is one of the best things that you can do for your health. It is never too late to quit. WHAT ARE THE BENEFITS OF QUITTING SMOKING? When you quit smoking, you lower your risk of developing serious diseases and conditions, such as:  Lung cancer or lung disease, such as COPD.  Heart disease.  Stroke.  Heart attack.  Infertility.  Osteoporosis and bone fractures. Additionally, symptoms such as coughing, wheezing, and shortness of breath may get better when you quit. You may also find that you get sick less often because your body is stronger at fighting off colds and infections. If you are pregnant, quitting smoking can help to reduce your chances of having a baby of low birth weight. HOW DO I GET READY TO QUIT? When you decide to quit smoking, create a plan to make sure that you are successful. Before you quit:  Pick a date to quit. Set a date within the next two weeks to give you time to prepare.  Write down the reasons why you are quitting. Keep this list in places where you will see  it often, such as on your bathroom mirror or in your car or wallet.  Identify the people, places, things, and activities that make you want to smoke (triggers) and avoid them. Make sure to take these actions:  Throw away all cigarettes at home, at work, and in your car.  Throw away smoking accessories, such as Set designer.  Clean your car and make sure to empty the ashtray.  Clean your home, including curtains and carpets.  Tell your family, friends, and coworkers that you are quitting. Support from your loved ones can make quitting easier.  Talk with your health care provider about your options for quitting smoking.  Find out what treatment options are covered by your health insurance. WHAT STRATEGIES CAN I USE TO QUIT SMOKING?  Talk with your healthcare provider about different strategies to quit smoking. Some strategies include:  Quitting smoking altogether instead of gradually lessening how much you smoke over a period of time. Research shows that quitting "cold [place]" is more successful than gradually quitting.  Attending in-person counseling to help you build problem-solving skills. You are more likely to have success in quitting if you attend several counseling sessions. Even short sessions of 10 minutes can be effective.  Finding resources and support systems that  can help you to quit smoking and remain smoke-free after you quit. These resources are most helpful when you use them often. They can include:  Online chats with a Veterinary surgeon.  Telephone quitlines.  Printed Materials engineer.  Support groups or group counseling.  Text messaging programs.  Mobile phone applications.  Taking medicines to help you quit smoking. (If you are pregnant or breastfeeding, talk with your health care provider first.) Some medicines contain nicotine and some do not. Both types of medicines help with cravings, but the medicines that include nicotine help to relieve withdrawal  symptoms. Your health care provider may recommend:  Nicotine patches, gum, or lozenges.  Nicotine inhalers or sprays.  Non-nicotine medicine that is taken by mouth. Talk with your health care provider about combining strategies, such as taking medicines while you are also receiving in-person counseling. Using these two strategies together makes you more likely to succeed in quitting than if you used either strategy on its own. If you are pregnant or breastfeeding, talk with your health care provider about finding counseling or other support strategies to quit smoking. Do not take medicine to help you quit smoking unless told to do so by your health care provider. WHAT THINGS CAN I DO TO MAKE IT EASIER TO QUIT? Quitting smoking might feel overwhelming at first, but there is a lot that you can do to make it easier. Take these important actions:  Reach out to your family and friends and ask that they support and encourage you during this time. Call telephone quitlines, reach out to support groups, or work with a counselor for support.  Ask people who smoke to avoid smoking around you.  Avoid places that trigger you to smoke, such as bars, parties, or smoke-break areas at work.  Spend time around people who do not smoke.  Lessen stress in your life, because stress can be a smoking trigger for some people. To lessen stress, try:  Exercising regularly.  Deep-breathing exercises.  Yoga.  Meditating.  Performing a body scan. This involves closing your eyes, scanning your body from head to toe, and noticing which parts of your body are particularly tense. Purposefully relax the muscles in those areas.  Download or purchase mobile phone or tablet apps (applications) that can help you stick to your quit plan by providing reminders, tips, and encouragement. There are many free apps, such as QuitGuide from the [employer] developer for Disease Control and Prevention). You can find other support for  quitting smoking (smoking cessation) through smokefree.gov and other websites. HOW WILL I FEEL WHEN I QUIT SMOKING? Within the first 24 hours of quitting smoking, you may start to feel some withdrawal symptoms. These symptoms are usually most noticeable 2-3 days after quitting, but they usually do not last beyond 2-3 weeks. Changes or symptoms that you might experience include:  Mood swings.  Restlessness, anxiety, or irritation.  Difficulty concentrating.  Dizziness.  Strong cravings for sugary foods in addition to nicotine.  Mild weight gain.  Constipation.  Nausea.  Coughing or a sore throat.  Changes in how your medicines work in your body.  A depressed mood.  Difficulty sleeping (insomnia). After the first 2-3 weeks of quitting, you may start to notice more positive results, such as:  Improved sense of smell and taste.  Decreased coughing and sore throat.  Slower heart rate.  Lower blood pressure.  Clearer skin.  The ability to breathe more easily.  Fewer sick days. Quitting smoking is very challenging for most  people. Do not get discouraged if you are not successful the first time. Some people need to make many attempts to quit before they achieve long-term success. Do your best to stick to your quit plan, and talk with your health care provider if you have any questions or concerns.   This information is not intended to replace advice given to you by your health care provider. Make sure you discuss any questions you have with your health care provider.   Document Released: 02/21/2001 Document Revised: 07/14/2014 Document Reviewed: 07/14/2014 Elsevier Interactive Patient Education [employer]2016 Elsevier Inc.

## 2015-12-02 NOTE — Progress Notes (Signed)
[facility] [place]   CC: Follow up Abdominal Aortic Aneurysm  History of Present Illness  Alexandra Mays is a 65 y.o. ([birth year]) female patient of Dr. Darrick Penna who presents for evaluation of asymptomatic abdominal aortic aneurysm. The patient had an ultrasound of her aorta after a pulsatile mass was found on exam by Dr. Tomie China. She denies any family history of abdominal aortic aneurysm. She reports chronic moderate intermittent back pain, denies any new back pain, denies any abdominal pain.  She currently smokes 1 pack cigarettes per day but is trying to quit. Other medical problems include hyperlipidemia and COPD. She does have a chronic cough. Patient's history does list that she has had coronary stenting in the past.   She denies tingling, numbness, weakness, pain, or cold sensation in either hand/arm.   The patient does not seem to have claudication in her legs with walking. The patient denies history of stroke or TIA symptoms.  She does not take a daily ASA, does take a daily statin.   Pt Diabetic: No Pt smoker: smoker  (3/4 ppd, started at age 46 yrs)  Past Medical History:  Diagnosis Date  . AAA (abdominal aortic aneurysm) (HCC)   . COPD (chronic obstructive pulmonary disease) (HCC)   . Pure hyperglyceridemia    Past Surgical History:  Procedure Laterality Date  . cardiac stenting  2007  . NECK SURGERY     Social History Social History   Social History  . Marital status: Widowed    Spouse name: N/A  . Number of children: N/A  . Years of education: N/A   Occupational History  . Not on file.   Social History Main Topics  . Smoking status: Current Every Day Smoker    Packs/day: 1.00    Years: 50.00    Types: Cigarettes  . Smokeless tobacco: Never Used  . Alcohol use No  . Drug use: No  . Sexual activity: Not on file   Other Topics Concern  . Not on file   Social History Narrative  . No narrative on file   Family History Family  History  Problem Relation Age of Onset  . Stroke Mother     Current Outpatient Prescriptions on File Prior to Visit  Medication Sig Dispense Refill  . albuterol (PROVENTIL HFA;VENTOLIN HFA) 108 (90 Base) MCG/ACT inhaler Inhale 2 puffs into the lungs every 6 (six) hours as needed for wheezing or shortness of breath.    . Fluticasone Furoate-Vilanterol (BREO ELLIPTA) 100-25 MCG/INH AEPB Inhale into the lungs daily.    . simvastatin (ZOCOR) 20 MG tablet Take 20 mg by mouth daily.    . Tiotropium Bromide Monohydrate (SPIRIVA RESPIMAT) 2.5 MCG/ACT AERS Inhale 2 puffs into the lungs daily.     No current facility-administered medications on file prior to visit.    No Known Allergies  ROS: See HPI for pertinent positives and negatives.  Physical Examination  Vitals:   12/02/15 0925 12/02/15 0926  BP: (!) 155/72 119/71  Pulse: 79 80  Resp: 12   Temp: (!) 96.8 F (36 C)   TempSrc: Oral   SpO2: 99%   Weight: 96 lb 1.6 oz (43.6 kg)   Height: 5\' 4"  (1.626 m)    Body mass index is 16.5 kg/m.  General: A&O x 3, WD, thin female.  Pulmonary: Sym exp, respirations are non labored, fair air movt in all fields,  no rales, rhonchi, or wheezing.  Cardiac: RRR, Nl S1, S2, no detected murmur.  Carotid Bruits Right Left   Negative Negative   Aorta is strongly palpable Radial pulses are 2+ palpable and =                          VASCULAR EXAM:                                                                                                         LE Pulses Right Left       FEMORAL  3+ palpable  3+ palpable        POPLITEAL  not palpable   not palpable       POSTERIOR TIBIAL  not palpable   not palpable        DORSALIS PEDIS      ANTERIOR TIBIAL 2+ palpable  2+ palpable      Gastrointestinal: soft, NTND, -G/R, - HSM, - masses palpated, - CVAT B.  Musculoskeletal: M/S 5/5 throughout, Extremities without ischemic changes.  Neurologic: CN 2-12 intact, Pain and light touch intact  in extremities are intact, Motor exam as listed above.   Non-Invasive Vascular Imaging  AAA Duplex (12/02/2015)  [facility] (Date: 04/02/15)  Current size:  4.7 cm (Date: 12/02/15), right CIA: 1.5 cm, left CIA: 1.3 cm  Decreased visualization due to overlying bowel gas.  Thrombus within the distal aneurysm.  No hemodynamically significant stenosis of the abdominal aorta and bilateral proximal common iliac arteries  No significant change compared to the [facility].   Medical Decision Making  The patient is a 65 y.o. female who presents with asymptomatic AAA with no increase in size.  Her abdominal aortic aneurysmal growth risk factors include 52 years smoking, and COPD with chronic cough. Her blood pressure is in good control.  The patient was counseled re smoking cessation and given several free resources re smoking cessation.   Based on this patient's exam and diagnostic studies, the patient will follow up in 6 months  with the following studies: AAA duplex.  Consideration for repair of AAA would be made when the size is 5.5 cm, growth > 1 cm/yr, and symptomatic status.  I emphasized the importance of maximal medical management including strict control of blood pressure, blood glucose, and lipid levels, antiplatelet agents, obtaining regular exercise, and  cessation of smoking.   The patient was given information about AAA including signs, symptoms, treatment, and how to minimize the risk of enlargement and rupture of aneurysms.    The patient was advised to call 911 should the patient experience sudden onset abdominal or back pain.   Thank you for allowing us to participate in this patient's care.  Charisse MarchSuzanne Nickel, RN, MSN, FNP-C [facility] [place] Office: [phone]  Clinic Physician: Darrick PennaFields  12/02/2015, 9:37 AM

## 2016-01-11 IMAGING — US US AORTA
1 series · 14 of 15 positions shown · non-contrast
Comparison: None.

CLINICAL DATA: Reported history of abdominal aortic aneurysm. Back
pain.

EXAM:
ULTRASOUND OF ABDOMINAL AORTA
TECHNIQUE: Ultrasound examination of the abdominal aorta was performed to
evaluate for abdominal aortic aneurysm.

[Series 1: us aorta · 0.23mm/px · 14 of 15 slices shown]
[im 1/15]
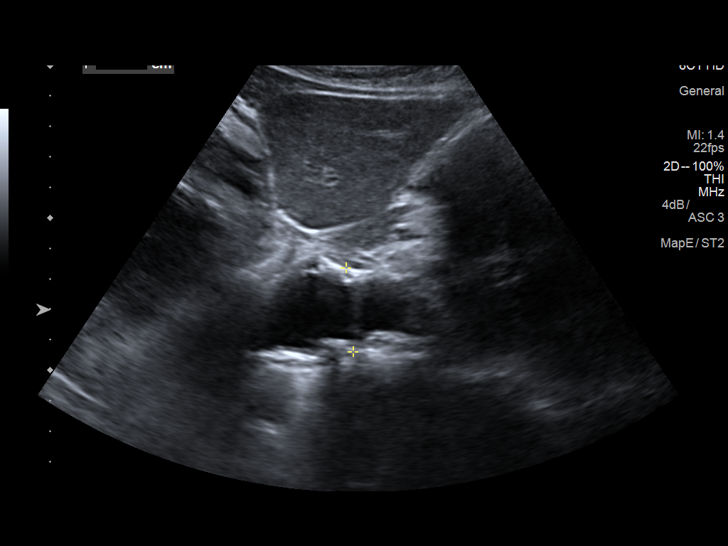
[im 2/15]
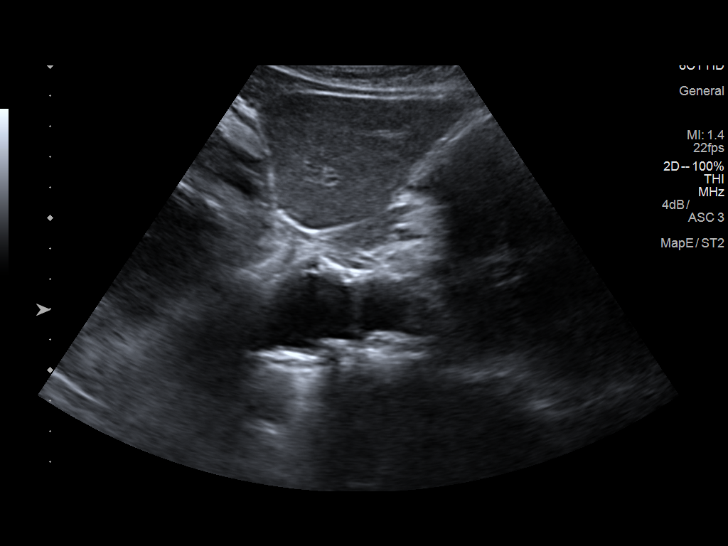
[im 3/15]
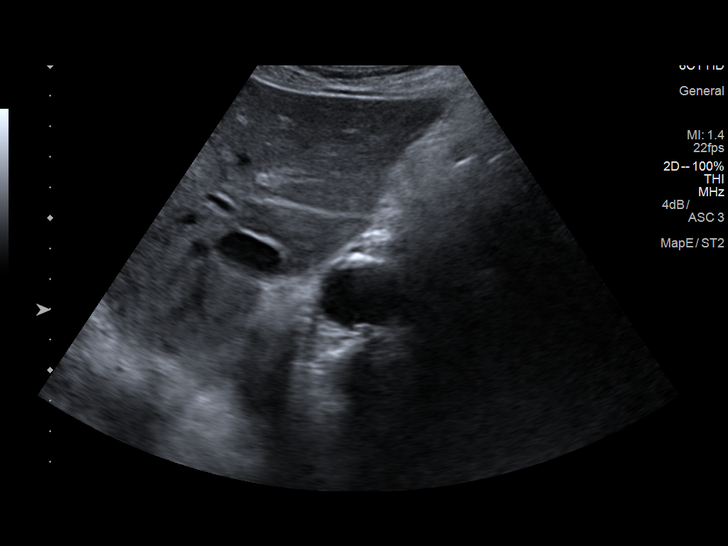
[im 4/15]
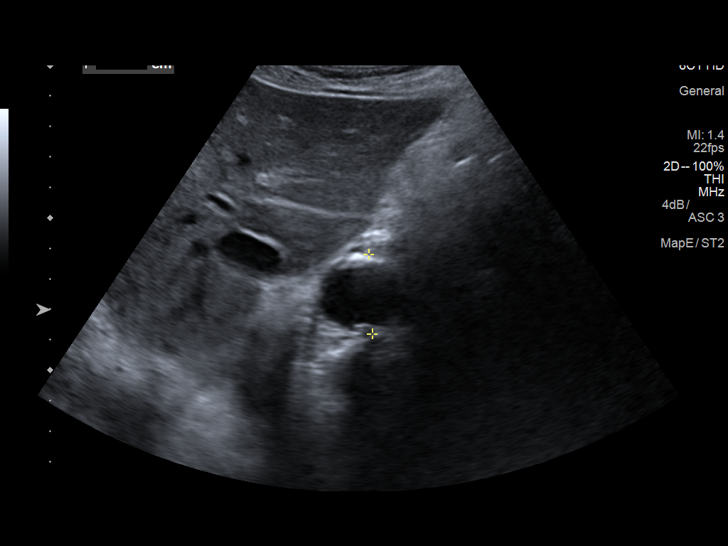
[im 5/15]
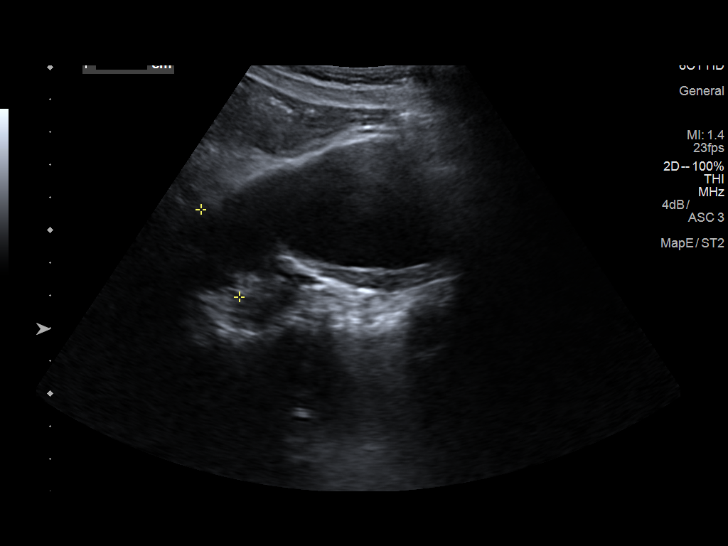
[im 6/15]
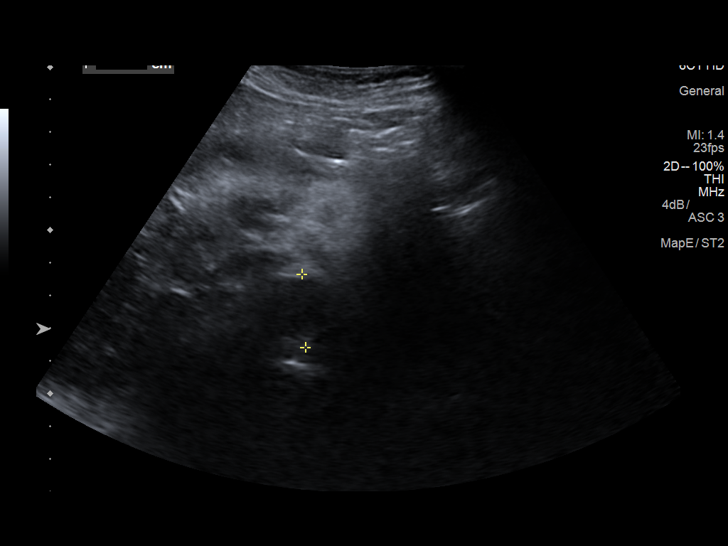
[im 7/15]
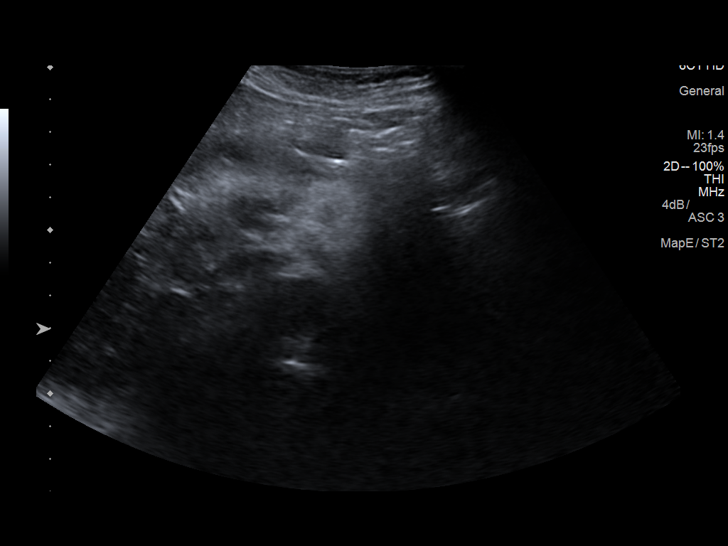
[im 9/15]
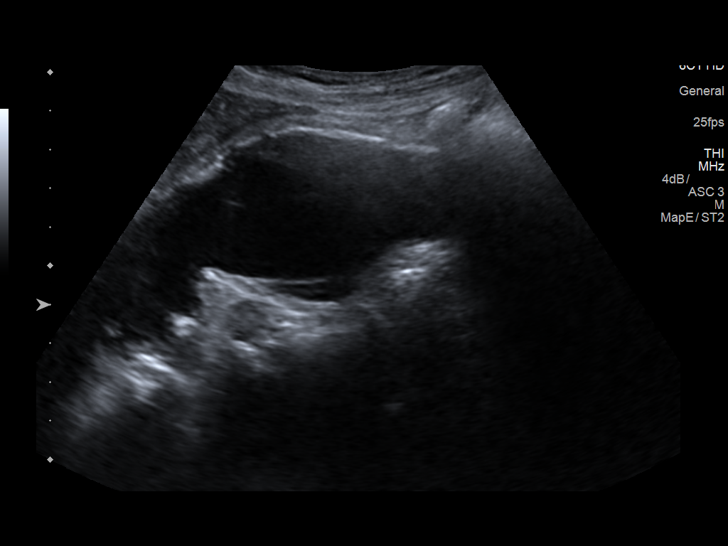
[im 10/15]
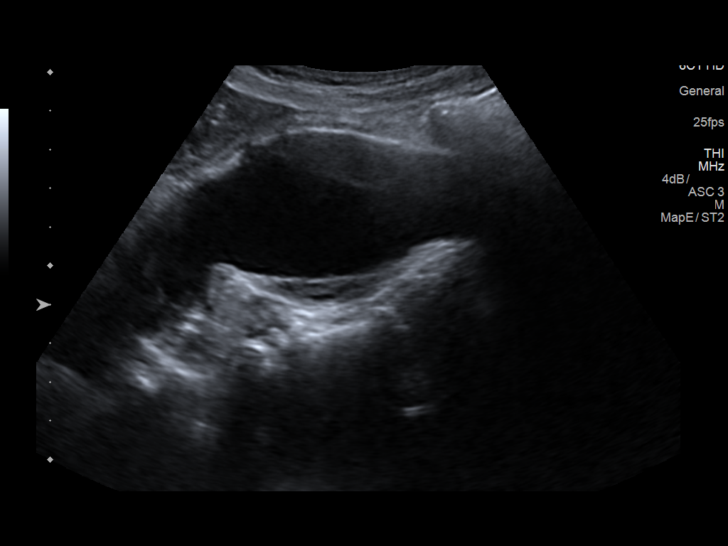
[im 11/15]
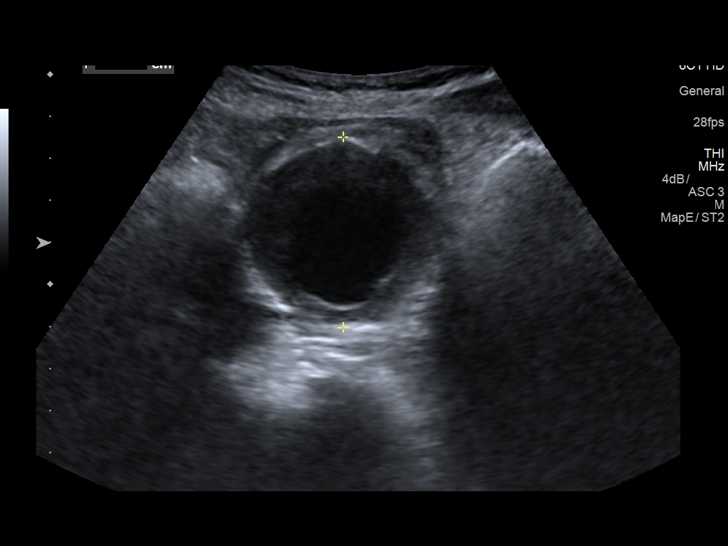
[im 12/15]
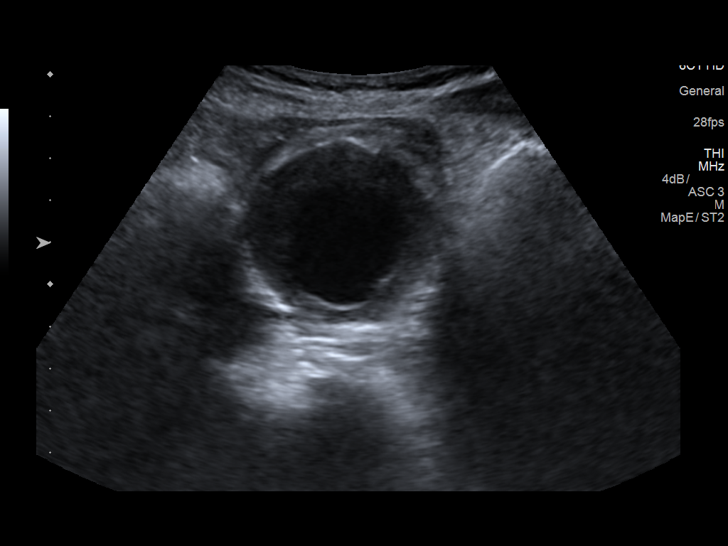
[im 13/15]
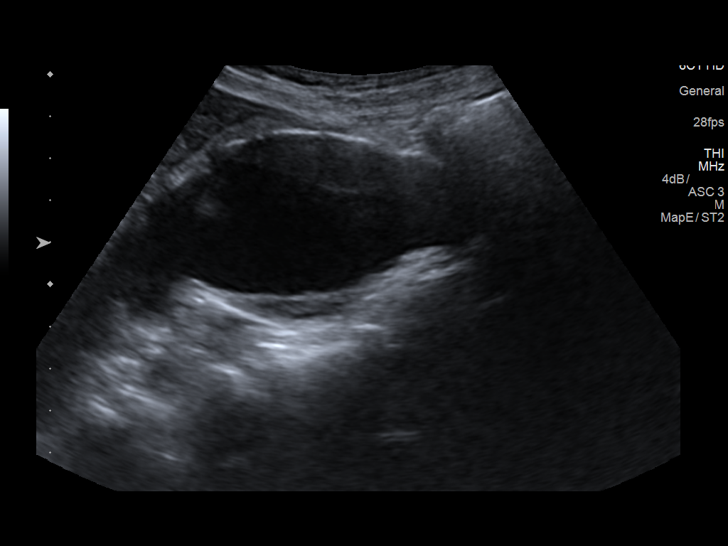
[im 14/15]
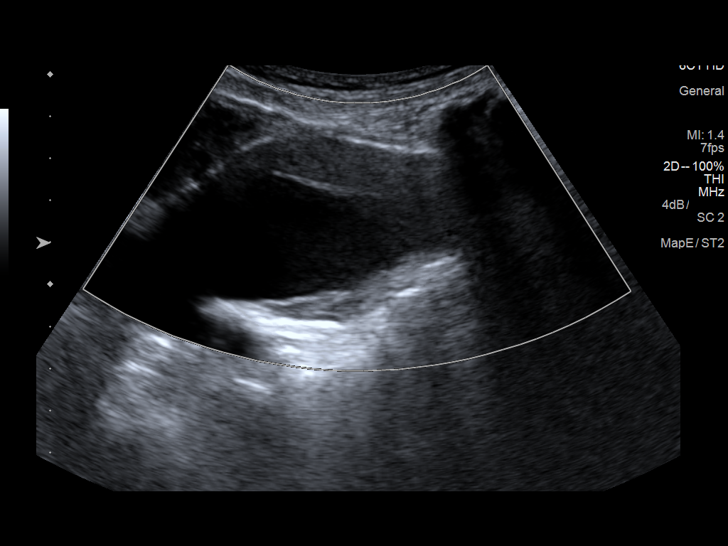
[im 15/15]
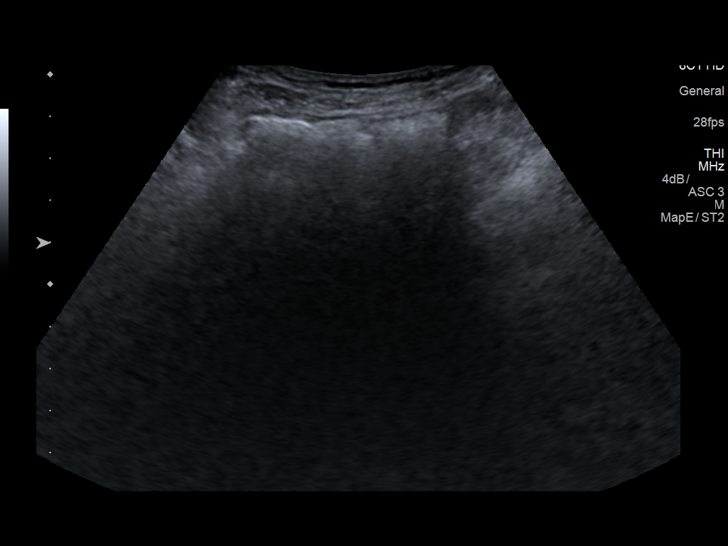

[14 of 15 positions shown; findings below may reference images not displayed]

FINDINGS: Abdominal Aorta

Atherosclerotic abdominal aorta. Fusiform infrarenal abdominal
aortic aneurysm with maximum diameter 4.9 cm.

Maximum Diameter: 4.9 cm
IMPRESSION: Aortic atherosclerosis. Fusiform infrarenal abdominal aortic
aneurysm, maximum diameter 4.9 cm. Recommend followup by abdomen and
pelvis CTA in 6 months, and vascular surgery referral/consultation
if not already obtained. This recommendation follows ACR consensus
guidelines: White Paper of the ACR Incidental Findings Committee II

## 2016-02-15 NOTE — Addendum Note (Signed)
Addended by: Burton ApleyPETTY, India Jolin A on: 02/15/2016 09:05 AM   Modules accepted: Orders

## 2016-02-16 DIAGNOSIS — J449 Chronic obstructive pulmonary disease, unspecified: Secondary | ICD-10-CM | POA: Diagnosis not present

## 2016-02-16 DIAGNOSIS — R636 Underweight: Secondary | ICD-10-CM | POA: Diagnosis not present

## 2016-02-16 DIAGNOSIS — Z681 Body mass index (BMI) 19 or less, adult: Secondary | ICD-10-CM | POA: Diagnosis not present

## 2016-04-06 DIAGNOSIS — E785 Hyperlipidemia, unspecified: Secondary | ICD-10-CM | POA: Diagnosis not present

## 2016-06-06 ENCOUNTER — Encounter: Payer: Self-pay | Admitting: Family

## 2016-06-15 ENCOUNTER — Ambulatory Visit (INDEPENDENT_AMBULATORY_CARE_PROVIDER_SITE_OTHER): Payer: Medicare Other | Admitting: Family

## 2016-06-15 ENCOUNTER — Ambulatory Visit (HOSPITAL_COMMUNITY)
Admission: RE | Admit: 2016-06-15 | Discharge: 2016-06-15 | Disposition: A | Discharge status: Home / Self Care | Payer: Medicare Other | Source: Ambulatory Visit | Attending: Family | Admitting: Family

## 2016-06-15 ENCOUNTER — Encounter: Payer: Self-pay | Admitting: Family

## 2016-06-15 VITALS — BP 134/71 | HR 73 | Temp 97.9°F | Resp 18 | Ht 64.0 in | Wt 94.0 lb

## 2016-06-15 DIAGNOSIS — I714 Abdominal aortic aneurysm, without rupture, unspecified: Secondary | ICD-10-CM

## 2016-06-15 DIAGNOSIS — F17200 Nicotine dependence, unspecified, uncomplicated: Secondary | ICD-10-CM | POA: Diagnosis not present

## 2016-06-15 NOTE — Progress Notes (Signed)
[facility] [place]   CC: Follow up Abdominal Aortic Aneurysm  History of Present Illness  Alexandra Mays is a 66 y.o. ([birth year]) female patient of Dr. Darrick Penna who presents for evaluation of asymptomatic abdominal aortic aneurysm. The patient had an ultrasound of her aorta after a pulsatile mass was found on exam by Dr. Tomie China. She denies any family history of abdominal aortic aneurysm. She reports chronic moderate intermittent back pain, denies any new back pain, denies any abdominal pain.  Medical problems include hyperlipidemia and COPD. She does have a chronic cough. Patient's history does list that she has had coronary stenting in the past.   She denies tingling, numbness, weakness, pain, or cold sensation in either hand/arm.   The patient does not seem to have claudication in her legs with walking. The patient denies any history of stroke or TIA symptoms.  She does not take a daily ASA, does take a daily statin.   Pt Diabetic: No Pt smoker: smoker  (3/4 ppd, started at age 43 yrs)   Past Medical History:  Diagnosis Date  . AAA (abdominal aortic aneurysm) (HCC)   . COPD (chronic obstructive pulmonary disease) (HCC)   . Pure hyperglyceridemia    Past Surgical History:  Procedure Laterality Date  . cardiac stenting  2007  . NECK SURGERY     Social History Social History   Social History  . Marital status: Widowed    Spouse name: N/A  . Number of children: N/A  . Years of education: N/A   Occupational History  . Not on file.   Social History Main Topics  . Smoking status: Current Every Day Smoker    Packs/day: 1.00    Years: 50.00    Types: Cigarettes  . Smokeless tobacco: Never Used  . Alcohol use No  . Drug use: No  . Sexual activity: Not on file   Other Topics Concern  . Not on file   Social History Narrative  . No narrative on file   Family History Family History  Problem Relation Age of Onset  . Stroke Mother      Current Outpatient Prescriptions on File Prior to Visit  Medication Sig Dispense Refill  . albuterol (PROVENTIL HFA;VENTOLIN HFA) 108 (90 Base) MCG/ACT inhaler Inhale 2 puffs into the lungs every 6 (six) hours as needed for wheezing or shortness of breath.    . Fluticasone Furoate-Vilanterol (BREO ELLIPTA) 100-25 MCG/INH AEPB Inhale into the lungs daily.    . simvastatin (ZOCOR) 20 MG tablet Take 20 mg by mouth daily.    . Tiotropium Bromide Monohydrate (SPIRIVA RESPIMAT) 2.5 MCG/ACT AERS Inhale 2 puffs into the lungs daily.     No current facility-administered medications on file prior to visit.    No Known Allergies  ROS: See HPI for pertinent positives and negatives.  Physical Examination  Vitals:   06/15/16 0839  BP: 134/71  Pulse: 73  Resp: 18  Temp: 97.9 F (36.6 C)  TempSrc: Oral  SpO2: 97%  Weight: 94 lb (42.6 kg)  Height:  (1.626 m)   Body mass index is 16.14 kg/m.  General:A&O x 3, WD, thin female.  Pulmonary: Sym exp, respirations are non labored, limited air movt in all fields, no rales, rhonchi, or wheezing.  Cardiac: RRR, Nl S1, S2, no detected murmur.   Carotid Bruits Right Left   Negative Negative   Abdominal aorta is strongly palpable Radial pulses are 2+ palpable and =  VASCULAR EXAM:                                                                                                                                           LE Pulses Right Left       FEMORAL  3+ palpable  3+ palpable        POPLITEAL  not palpable   not palpable       POSTERIOR TIBIAL  1+ palpable   1+ palpable        DORSALIS PEDIS      ANTERIOR TIBIAL 2+ palpable  2+ palpable      Gastrointestinal: soft, NTND, -G/R, - HSM, - masses palpated, - CVAT B.  Musculoskeletal: M/S 5/5 throughout, Extremities without ischemic changes.  Skin: Skin is friable at dorsal aspects of hands and forearms, these same areas are covered with  ecchymosis.   Neurologic: CN 2-12 intact, Pain and light touch intact in extremities are intact, Motor exam as listed above.    Non-Invasive Vascular Imaging  AAA Duplex (06/15/2016)  Previous size: 4.7 cm (Date: 12-02-15)  Current size:  4.9 cm (Date: 06-15-16), Right CIA: 1.6 cm, Left CIA: 0.9 cm. Decreased visualization of the abdominal vasculature due to overlying bowel gas. Thrombus within the distal aneurysm. Irregular plaque with no evidence of significant stenosis of the abdominal aorta and bilateral proximal common iliac arteries.   Medical Decision Making  The patient is a 66 y.o. female who presents with asymptomatic AAA with a slight increase in size, to 4.9 cm, based on limited visualization. Thrombus is noted in the distal aneurysm, but she has no claudication sx's with walking, no distal embolic signs or symptoms. Her pedal pulses are palpable bilaterally.  Her abdominal aortic aneurysmal growth risk factors include 52 years smoking, and COPD with chronic cough. Her blood pressure is in good control.  The patient was counseled re smoking cessation and given several free resources re smoking cessation.    Based on this patient's exam and diagnostic studies, the patient will follow up in 6 months with the following studies: CTA abd/pelvis, see Dr. Darrick Penna afterward.   Consideration for repair of AAA would be made when the size is 5.5 cm, growth > 1 cm/yr, and symptomatic status.  I emphasized the importance of maximal medical management including strict control of blood pressure, blood glucose, and lipid levels, antiplatelet agents, obtaining regular exercise, and cessation of smoking.   The patient was given information about AAA including signs, symptoms, treatment, and how to minimize the risk of enlargement and rupture of aneurysms.    The patient was advised to call 911 should the patient experience sudden onset abdominal or back pain.   Thank you for allowing [place] to  participate in this patient's care.  Charisse March, RN, MSN, FNP-C [facility] [place] Office: [phone]  Clinic Physician: Darrick Penna  06/15/2016, 10:44  AM     

## 2016-06-15 NOTE — Patient Instructions (Signed)
Abdominal Aortic Aneurysm Blood pumps away from the heart through tubes (blood vessels) called arteries. Aneurysms are weak or damaged places in the wall of an artery. It bulges out like a balloon. An abdominal aortic aneurysm happens in the main artery of the body (aorta). It can burst or tear, causing bleeding inside the body. This is an emergency. It needs treatment right away. What are the causes? The exact cause is unknown. Things that could cause this problem include:  Fat and other substances building up in the lining of a tube.  Swelling of the walls of a blood vessel.  Certain tissue diseases.  Belly (abdominal) trauma.  An infection in the main artery of the body. What increases the risk? There are things that make it more likely for you to have an aneurysm. These include:  Being over the age of 68 years old.  Having high blood pressure (hypertension).  Being a female.  Being white.  Being very overweight (obese).  Having a family history of aneurysm.  Using tobacco products. What are the signs or symptoms? Symptoms depend on the size of the aneurysm and how fast it grows. There may not be symptoms. If symptoms occur, they can include:  Pain (belly, side, lower back, or groin).  Feeling full after eating a small amount of food.  Feeling sick to your stomach (nauseous), throwing up (vomiting), or both.  Feeling a lump in your belly that feels like it is beating (pulsating).  Feeling like you will pass out (faint). How is this treated?  Medicine to control blood pressure and pain.  Imaging tests to see if the aneurysm gets bigger.  Surgery. How is this prevented? To lessen your chance of getting this condition:  Stop smoking. Stop chewing tobacco.  Limit or avoid alcohol.  Keep your blood pressure, blood sugar, and cholesterol within normal limits.  Eat less salt.  Eat foods low in saturated fats and cholesterol. These are found in animal and whole  dairy products.  Eat more fiber. Fiber is found in whole grains, vegetables, and fruits.  Keep a healthy weight.  Stay active and exercise often. This information is not intended to replace advice given to you by your health care provider. Make sure you discuss any questions you have with your health care provider. Document Released: 06/24/2012 Document Revised: 08/05/2015 Document Reviewed: 03/29/2012 Elsevier Interactive Patient Education  2017 Elsevier Inc.      Steps to Quit Smoking Smoking tobacco can be bad for your health. It can also affect almost every organ in your body. Smoking puts you and people around you at risk for many serious long-lasting (chronic) diseases. Quitting smoking is hard, but it is one of the best things that you can do for your health. It is never too late to quit. What are the benefits of quitting smoking? When you quit smoking, you lower your risk for getting serious diseases and conditions. They can include:  Lung cancer or lung disease.  Heart disease.  Stroke.  Heart attack.  Not being able to have children (infertility).  Weak bones (osteoporosis) and broken bones (fractures). If you have coughing, wheezing, and shortness of breath, those symptoms may get better when you quit. You may also get sick less often. If you are pregnant, quitting smoking can help to lower your chances of having a baby of low birth weight. What can I do to help me quit smoking? Talk with your doctor about what can help you quit smoking. Some   things you can do (strategies) include:  Quitting smoking totally, instead of slowly cutting back how much you smoke over a period of time.  Going to in-person counseling. You are more likely to quit if you go to many counseling sessions.  Using resources and support systems, such as:  Online chats with a counselor.  Phone quitlines.  Printed self-help materials.  Support groups or group counseling.  Text messaging  programs.  Mobile phone apps or applications.  Taking medicines. Some of these medicines may have nicotine in them. If you are pregnant or breastfeeding, do not take any medicines to quit smoking unless your doctor says it is okay. Talk with your doctor about counseling or other things that can help you. Talk with your doctor about using more than one strategy at the same time, such as taking medicines while you are also going to in-person counseling. This can help make quitting easier. What things can I do to make it easier to quit? Quitting smoking might feel very hard at first, but there is a lot that you can do to make it easier. Take these steps:  Talk to your family and friends. Ask them to support and encourage you.  Call phone quitlines, reach out to support groups, or work with a counselor.  Ask people who smoke to not smoke around you.  Avoid places that make you want (trigger) to smoke, such as:  Bars.  Parties.  Smoke-break areas at work.  Spend time with people who do not smoke.  Lower the stress in your life. Stress can make you want to smoke. Try these things to help your stress:  Getting regular exercise.  Deep-breathing exercises.  Yoga.  Meditating.  Doing a body scan. To do this, close your eyes, focus on one area of your body at a time from head to toe, and notice which parts of your body are tense. Try to relax the muscles in those areas.  Download or buy apps on your mobile phone or tablet that can help you stick to your quit plan. There are many free apps, such as QuitGuide from the CDC (Centers for Disease Control and Prevention). You can find more support from smokefree.gov and other websites. This information is not intended to replace advice given to you by your health care provider. Make sure you discuss any questions you have with your health care provider. Document Released: 12/24/2008 Document Revised: 10/26/2015 Document Reviewed:  07/14/2014 Elsevier Interactive Patient Education  2017 Elsevier Inc.  

## 2016-06-16 NOTE — Addendum Note (Signed)
Addended by: Burton Apley A on: 06/16/2016 09:14 AM   Modules accepted: Orders

## 2016-06-23 ENCOUNTER — Telehealth: Payer: Self-pay

## 2016-06-23 ENCOUNTER — Emergency Department (HOSPITAL_COMMUNITY): Payer: Medicare Other

## 2016-06-23 ENCOUNTER — Encounter (HOSPITAL_COMMUNITY): Payer: Self-pay | Admitting: *Deleted

## 2016-06-23 ENCOUNTER — Emergency Department (HOSPITAL_COMMUNITY)
Admission: EM | Admit: 2016-06-23 | Discharge: 2016-06-23 | Disposition: A | Discharge status: Home / Self Care | Payer: Medicare Other | Attending: Physician Assistant | Admitting: Physician Assistant

## 2016-06-23 DIAGNOSIS — J449 Chronic obstructive pulmonary disease, unspecified: Secondary | ICD-10-CM | POA: Insufficient documentation

## 2016-06-23 DIAGNOSIS — M545 Low back pain, unspecified: Secondary | ICD-10-CM

## 2016-06-23 DIAGNOSIS — F17210 Nicotine dependence, cigarettes, uncomplicated: Secondary | ICD-10-CM | POA: Diagnosis not present

## 2016-06-23 DIAGNOSIS — Z7982 Long term (current) use of aspirin: Secondary | ICD-10-CM | POA: Diagnosis not present

## 2016-06-23 DIAGNOSIS — I700 Atherosclerosis of aorta: Secondary | ICD-10-CM | POA: Insufficient documentation

## 2016-06-23 DIAGNOSIS — R109 Unspecified abdominal pain: Secondary | ICD-10-CM

## 2016-06-23 DIAGNOSIS — M546 Pain in thoracic spine: Secondary | ICD-10-CM | POA: Diagnosis not present

## 2016-06-23 DIAGNOSIS — R1084 Generalized abdominal pain: Secondary | ICD-10-CM | POA: Diagnosis not present

## 2016-06-23 DIAGNOSIS — K5900 Constipation, unspecified: Secondary | ICD-10-CM | POA: Diagnosis not present

## 2016-06-23 DIAGNOSIS — I714 Abdominal aortic aneurysm, without rupture: Secondary | ICD-10-CM | POA: Diagnosis not present

## 2016-06-23 LAB — COMPREHENSIVE METABOLIC PANEL
ALBUMIN: 4.2 g/dL (ref 3.5–5.0)
ALT: 13 U/L — ABNORMAL LOW (ref 14–54)
ANION GAP: 12 (ref 5–15)
AST: 27 U/L (ref 15–41)
Alkaline Phosphatase: 60 U/L (ref 38–126)
BUN: 8 mg/dL (ref 6–20)
CO2: 23 mmol/L (ref 22–32)
Calcium: 9.7 mg/dL (ref 8.9–10.3)
Chloride: 103 mmol/L (ref 101–111)
Creatinine, Ser: 0.83 mg/dL (ref 0.44–1.00)
GFR calc Af Amer: >60 mL/min (ref >60)
GFR calc non Af Amer: >60 mL/min (ref >60)
Glucose, Bld: 93 mg/dL (ref 65–99)
POTASSIUM: 3.8 mmol/L (ref 3.5–5.1)
SODIUM: 138 mmol/L (ref 135–145)
TOTAL PROTEIN: 7.1 g/dL (ref 6.5–8.1)
Total Bilirubin: 0.6 mg/dL (ref 0.3–1.2)

## 2016-06-23 LAB — URINALYSIS, ROUTINE W REFLEX MICROSCOPIC
BILIRUBIN URINE: NEGATIVE
Glucose, UA: NEGATIVE mg/dL
Hgb urine dipstick: NEGATIVE
Ketones, ur: 5 mg/dL — AB
Leukocytes, UA: NEGATIVE
NITRITE: NEGATIVE
PH: 5 (ref 5.0–8.0)
Protein, ur: NEGATIVE mg/dL
SPECIFIC GRAVITY, URINE: 1.005 (ref 1.005–1.030)

## 2016-06-23 LAB — LIPASE, BLOOD: LIPASE: 17 U/L (ref 11–51)

## 2016-06-23 LAB — CBC
HEMATOCRIT: 41.1 % (ref 36.0–46.0)
HEMOGLOBIN: 13.6 g/dL (ref 12.0–15.0)
MCH: 30.9 pg (ref 26.0–34.0)
MCHC: 33.1 g/dL (ref 30.0–36.0)
MCV: 93.4 fL (ref 78.0–100.0)
Platelets: 186 10*3/uL (ref 150–400)
RBC: 4.4 MIL/uL (ref 3.87–5.11)
RDW: 13.4 % (ref 11.5–15.5)
WBC: 9.4 10*3/uL (ref 4.0–10.5)

## 2016-06-23 IMAGING — CT CT ANGIO CHEST-ABD-PELV FOR DISSECTION W/ AND WO/W CM
1 of 9 series · 1 of 36 positions shown, 3 images · IV contrast (Iohexol (Omnipaque 350))
Comparison: Abdominal ultrasound - earlier same day

CLINICAL DATA: Low back pain. History of abdominal aortic aneurysm
and COPD. Evaluate for abdominal aortic aneurysm and/or dissection.

EXAM:
CT ANGIOGRAPHY CHEST, ABDOMEN AND PELVIS
TECHNIQUE: Multidetector CT imaging through the chest, abdomen and pelvis was
performed using the standard protocol during bolus administration of
intravenous contrast. Multiplanar reconstructed images and MIPs were
obtained and reviewed to evaluate the vascular anatomy.
CONTRAST:  100 cc Isovue 370

[Series 300: locator · axial · 0.68mm/px · z∈[+309,+309]mm · 1 of 1 slices shown, 3 images]
[im 1/1  mediastinal]
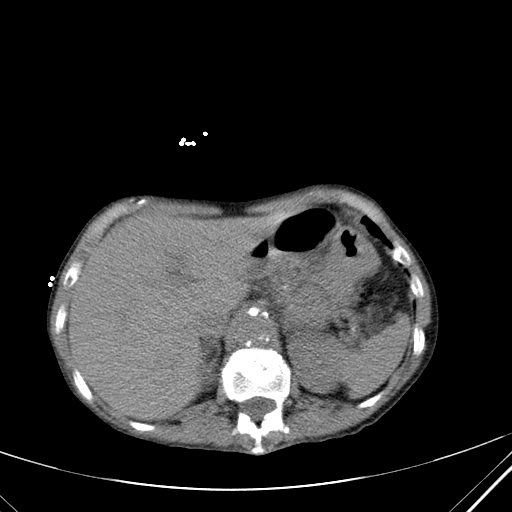
[im 1/1  lung]
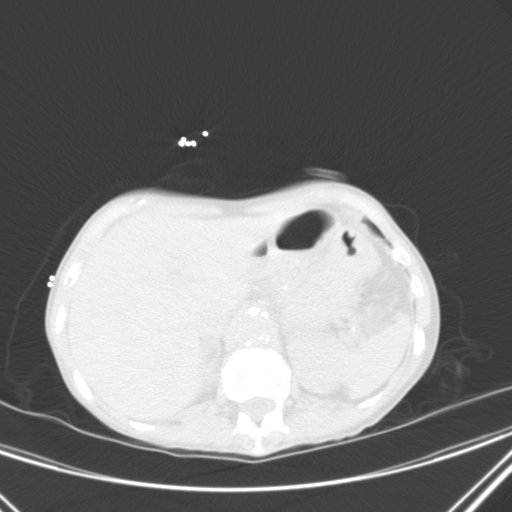
[im 1/1  bone]
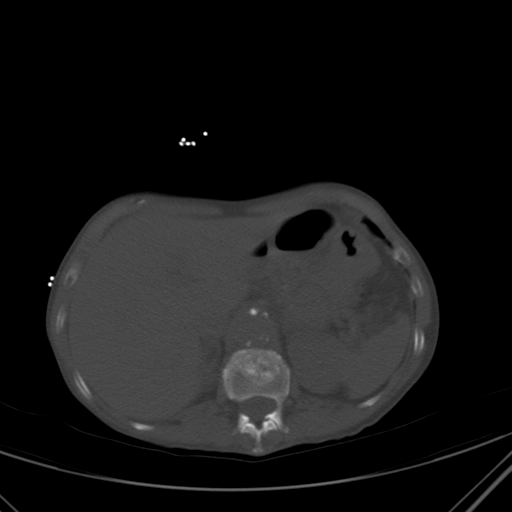

[1 of 36 positions shown; findings below may reference images not displayed]

FINDINGS: CTA CHEST FINDINGS

Vascular Findings:

Normal heart size.  No pericardial effusion.

There is a moderate to large amount of eccentric mixed calcified and
noncalcified irregular atherosclerotic plaque throughout the
thoracic aorta, not resulting in a hemodynamically significant
stenosis.

There is a wide mouth penetrating atherosclerotic plaque involving
the medial aspect of the proximal descending thoracic aorta which
measures approximately 0.9 x 0.3 x 2.3 cm (axial image 80, series
501, coronal image 68, series 502). There is an additional wide
mouth (approximately 1.7 x 0.6 x 1.9 cm penetrating atherosclerotic
ulcer arising from the anterior aspect of the distal descending
thoracic aorta (axial image 139, series 501, sagittal image 113,
series 503). No perivascular stranding. No evidence of thoracic
aortic dissection or periaortic stranding.

Although this examination was not tailored for the evaluation the
pulmonary arteries, there are no discrete filling defects within the
central pulmonary arterial tree to suggest central pulmonary
embolism. Normal caliber of the main pulmonary artery.

-------------------------------------------------------------

Thoracic aortic measurements:

Sinotubular junction

27 mm as measured in greatest oblique coronal dimension.

Proximal ascending aorta

30 mm as measured in greatest oblique axial dimension at the level
of the main pulmonary artery.

Aortic arch aorta

25 mm as measured in greatest oblique sagittal dimension.

Proximal descending thoracic aorta

25 mm as measured in greatest oblique axial dimension at the level
of the main pulmonary artery.

Distal descending thoracic aorta

20 mm as measured in greatest oblique axial dimension at the level
of the diaphragmatic hiatus.

Review of the MIP images confirms the above findings.

-------------------------------------------------------------

Non-Vascular Findings:

Mediastinum/Lymph Nodes: Scattered mediastinal and hilar lymph nodes
are individually not enlarged by size criteria with index precarinal
lymph node measuring 0.8 cm in greatest short axis diameter over 63,
series 501 an index right suprahilar lymph node measuring 0.7 cm
(image 72, series 501). No bulky mediastinal, hilar axillary
lymphadenopathy.

Lungs/Pleura: There is a punctate (approximately 0.9 cm) nodule
within the right lung apex (image 12, series 201).

Advanced apical predominant centrilobular emphysematous change.
Minimal subsegmental atelectasis within the medial basilar segments
of the bilateral lower lobes, right greater than left. No focal
airspace opacities. No pleural effusion or pneumothorax.

A minimal amount of nonocclusive debris is noted within in the
tracheal air column (image 37, series 501) as well as the right
mainstem bronchus (image 64, series 501).

Musculoskeletal: No acute or aggressive osseous abnormalities within
the chest. Regional soft tissues appear normal.

CTA ABDOMEN AND PELVIS FINDINGS

VASCULAR

Aorta: There is a large amount of irregular calcified and
noncalcified atherosclerotic plaque throughout the abdominal aorta,
not definitely resulting in hemodynamically significant stenosis.

There is a large (approximately 4.7 x 4.8 x 55.4 cm infrarenal
abdominal aortic aneurysm. The aneurysm arises approximately 1.9 cm
caudal to the take-off of the most inferior right renal artery and
extends to the level of the aortic bifurcation. There is a large
amount of eccentric predominantly noncalcified atherosclerotic
plaque within the dominant component of the aneurysm. No definitive
abdominal aortic dissection or periaortic stranding.

Celiac: There is tortuosity involving the origin of the celiac
artery, not definitely resulting in a hemodynamically significant
stenosis. Conventional branching pattern.

SMA: There is a minimal amount of mixed calcified and noncalcified
atherosclerotic plaque involving the cranial aspect of the origin
and proximal SMA, which is approaches approximately 50% luminal
narrowing (image 154, series 501). Conventional branching pattern.
The distal tributaries the SMA are patent without discrete
intraluminal filling defect to suggest distal embolism.

Renals: Note is made of 3 discrete right-sided renal artery's all of
which appear patent without a definitive hemodynamically significant
stenosis. There is a minimal amount of mixed calcified and
noncalcified atherosclerotic plaque involving the origin of left
renal artery, not definitely resulting in a hemodynamically
significant stenosis.

IMA: Diseased at its origin though patent.

Inflow: There is a large amount of irregular mixed calcified and
noncalcified atherosclerotic plaque involving the origin and
proximal aspects of the bilateral common iliac artery is likely
resulting in at least 50% luminal narrowing bilaterally, left
greater than right (representative images 210 and 214, series 501).

The bilateral internal iliac arteries are diseased though patent
without hemodynamically significant stenosis. There is a minimal to
moderate amount of irregular mixed calcified and noncalcified
atherosclerotic plaque within the bilateral external iliac artery
is, not definitely resulting in hemodynamically significant
stenosis. Eccentric probably calcified atherosclerotic plaque
results in approximate 50% luminal narrowing of the right common
femoral artery (image 283, series 501).

Moderate amount of eccentric mixed calcified and noncalcified
atherosclerotic plaque within the left common femoral artery, not
definitely resulting in hemodynamically significant stenosis.

Veins: The IVC and pelvic venous system appears patent on this CTA
examination.

Review of the MIP images confirms the above findings.

NON-VASCULAR

Evaluation of the abdominal organs is largely limited to the
arterial phase of enhancement.

Hepatobiliary: Normal hepatic contour. Punctate (approximately
cm) area of nodular enhancement within the peripheral subcapsular
aspect of the anterior segment of the right lobe of the liver (image
149, series 501) is too small to adequately characterize though may
represent a flash filling hemangioma. Normal appearance of the
gallbladder given degree distention. No radiopaque gallstones. No
intra extrahepatic bili duct dilatation. No ascites.

Pancreas: Normal early arterial phase appearance of the pancreas.

Spleen: Normal early arterial phase appearance of the spleen.

Adrenals/Urinary Tract: There is symmetric enhancement of the
bilateral kidneys. No definite renal stones on this postcontrast
examination. No discrete renal lesions. No urinary obstruction or
perinephric stranding.

Normal appearance of the urinary bladder given degree distention.

There is a suspected approximately 1.2 cm nodule within the
crux/lateral limb of the right adrenal gland (image 148, series
501). There is mild thickening of left adrenal gland without
discrete nodule.

Stomach/Bowel: Large colonic stool burden without evidence of
enteric obstruction. The bowel is normal in course and caliber
without wall thickening. Normal appearance of the retrocecal
appendix. No pneumoperitoneum, pneumatosis or portal venous gas.

Lymphatic: No definitive bulky retroperitoneal, mesenteric, pelvic
or inguinal lymphadenopathy on this early arterial phase
examination, though note, evaluation is degraded secondary to
patient's relatively cachectic state.

Reproductive: Normal appearance of the pelvic organs. No discrete
adnexal lesion. No free fluid in the pelvic cul-de-sac.

Other: Regional soft tissues appear normal.

Musculoskeletal: No acute or aggressive osseous abnormalities. Mild
scoliotic curvature of the lumbar spine, convex to the left with
associated moderate multilevel lumbar spine DDD. Bilateral facet
degenerative change of the lower lumbar spine.

Review of the MIP images confirms the above findings.
IMPRESSION: Vascular Impression of the chest:

1. Moderate to large amount of atherosclerotic plaque within a
normal caliber thoracic aorta as detailed above. Aortic
Atherosclerosis ([81]-170.0)
2. Penetrating atherosclerotic ulcers involving the proximal and
distal descending thoracic aorta (as detailed above) without
evidence of thoracic aortic dissection or periaortic stranding.
Nonvascular Impression of the check:

1. **An incidental finding of potential clinical significance has
been found. Indeterminate approximately 0.9 cm right upper lobe
pulmonary nodule. Correlation with prior outside examinations (if
available) is recommended. Otherwise, consider one of the following
in 3 months for both low-risk and high-risk individuals: (a) repeat
chest CT, (b) follow-up PET-CT, or (c) tissue sampling. This
recommendation follows the consensus statement: Guidelines for
Management of Incidental Pulmonary Nodules Detected on CT Images:
Vascular Impression:

1. Large infrarenal abdominal aortic aneurysm measuring
approximately 5.4 cm in diameter. The dominant component of the
aneurysm contains a large amount of irregular predominantly
noncalcified atherosclerotic plaque. No definitive dissection or
periaortic stranding. If not previously performed, referral to
vascular surgery is recommended.
2. Suspected hemodynamically significant stenoses involving the SMA,
bilateral common iliac arteries (left greater than right) and the
right superficial femoral artery as detailed above.
Nonvascular Impression:

1. No acute findings within the abdomen and pelvis.
2. **An incidental finding of potential clinical significance has
been found. Indeterminate approximately 1.2 cm nodule within the
crux/lateral limb of the right adrenal gland. Comparison with prior
outside examinations (if available) is recommended. Otherwise,
further evaluation with abdominal MRI could be performed as
clinically indicated.**

## 2016-06-23 MED ORDER — POLYETHYLENE GLYCOL 3350 17 G PO PACK: 17.0000 g | PACK | Freq: Every day | ORAL | 0 refills | Status: DC

## 2016-06-23 MED ORDER — IOPAMIDOL (ISOVUE-370) INJECTION 76%
INTRAVENOUS | Status: AC
  Administered 2016-06-23: 100 mL
  Filled 2016-06-23: qty 100

## 2016-06-23 NOTE — Progress Notes (Addendum)
[facility] [place]  Patient name: Alexandra Mays MRN: [id number] DOB: 1950/12/08 Sex: female   REFERRING PROVIDER:    ER   REASON FOR CONSULT:    AAA  HISTORY OF PRESENT ILLNESS:   Alexandra Mays is a 66 y.o. female, who Presented to the emergency department with a 2 day history of abdominal and lower back pain. She has been followed in our office with an ultrasound that showed aneurysmal 4.9 cm within the abdominal aorta.  She has a history of cardiac disease, having undergone a stent in the past. She is medically managed for hypercholesterolemia with a statin. She is a current smoker.  PAST MEDICAL HISTORY    Past Medical History:  Diagnosis Date  . AAA (abdominal aortic aneurysm) (HCC)   . COPD (chronic obstructive pulmonary disease) (HCC)   . Pure hyperglyceridemia      FAMILY HISTORY   Family History  Problem Relation Age of Onset  . Stroke Mother     SOCIAL HISTORY:   Social History   Social History  . Marital status: Widowed    Spouse name: N/A  . Number of children: N/A  . Years of education: N/A   Occupational History  . Not on file.   Social History Main Topics  . Smoking status: Current Every Day Smoker    Packs/day: 1.00    Years: 50.00    Types: Cigarettes  . Smokeless tobacco: Never Used  . Alcohol use No  . Drug use: No  . Sexual activity: Not on file   Other Topics Concern  . Not on file   Social History Narrative  . No narrative on file    ALLERGIES:    No Known Allergies  CURRENT MEDICATIONS:    No current facility-administered medications for this encounter.    Current Outpatient Prescriptions  Medication Sig Dispense Refill  . albuterol (PROVENTIL HFA;VENTOLIN HFA) 108 (90 Base) MCG/ACT inhaler Inhale 2 puffs into the lungs every 6 (six) hours as needed for wheezing or shortness of breath.    Marland Kitchen aspirin EC 81 MG tablet Take 81 mg by mouth at bedtime.    .  Fluticasone Furoate-Vilanterol (BREO ELLIPTA) 100-25 MCG/INH AEPB Inhale 1 puff into the lungs daily.     . simvastatin (ZOCOR) 20 MG tablet Take 20 mg by mouth daily.    . Tiotropium Bromide Monohydrate (SPIRIVA RESPIMAT) 2.5 MCG/ACT AERS Inhale 2 puffs into the lungs daily.      REVIEW OF SYSTEMS:    denotes positive finding,  denotes negative finding Cardiac  Comments:  Chest pain or chest pressure:    Shortness of breath upon exertion:    Short of breath when lying flat:    Irregular heart rhythm:        Vascular    Pain in calf, thigh, or hip brought on by ambulation:    Pain in feet at night that wakes you up from your sleep:     Blood clot in your veins:    Leg swelling:         Pulmonary    Oxygen at home:    Productive cough:     Wheezing:         Neurologic    Sudden weakness in arms or legs:     Sudden numbness in arms or legs:     Sudden onset of difficulty speaking or slurred speech:    Temporary loss of vision in one eye:  Problems with dizziness:         Gastrointestinal    Blood in stool:      Vomited blood:         Genitourinary    Burning when urinating:     Blood in urine:        Psychiatric    Major depression:         Hematologic    Bleeding problems:    Problems with blood clotting too easily:        Skin    Rashes or ulcers:        Constitutional    Fever or chills:     PHYSICAL EXAM:   Vitals:   06/23/16 1355 06/23/16 1409 06/23/16 1411  BP: (!) 148/74    Pulse: 82    Resp: 18    Temp: 98.1 F (36.7 C)    TempSrc: Oral    SpO2: 94%    Weight:   92 lb 9 oz (42 kg)  Height:   (1.6 m)     GENERAL: The patient is a well-nourished female, in no acute distress. The vital signs are documented above. CARDIAC: There is a regular rate and rhythm.  VASCULAR: Palpable femoral and pedal pulses. PULMONARY: Nonlabored respirations ABDOMEN: Pulsatile abdominal mass which is not tender. She does have tenderness to deep  palpation in the abdomen..  MUSCULOSKELETAL: There are no major deformities or cyanosis. NEUROLOGIC: No focal weakness or paresthesias are detected. SKIN: There are no ulcers or rashes noted. PSYCHIATRIC: The patient has a normal affect.  STUDIES:   I have reviewed her CT scan which shows a 5 cm infrarenal abdominal aortic aneurysm without evidence of rupture.  She is also constipated and has spinal stenosis at L4-L5  ASSESSMENT and PLAN   AAA:  At this time, I doubt that her symptoms are related to her aneurysm. She does appear to be somewhat constipated which could be contributing to her symptoms. I would consider elective repair of her aneurysm once her symptoms have resolved.  I will have her see Dr. Darrick Penna in 2 weeks   Durene Cal, MD [facility] [phone] Pager [phone]

## 2016-06-23 NOTE — ED Notes (Signed)
Madilyn Hook, MD contacted re: pts presentation, pts vitals WNL, pt known AAA, [place] order placed to be completed while waiting for room

## 2016-06-23 NOTE — ED Provider Notes (Signed)
[facility]-EMER[facility]ENCY DEPT Provider Note   CSN: [id number] Arrival date & time: 06/23/16  1340     History   Chief Complaint Chief Complaint  Patient presents with  . Back Pain  . Abdominal Pain  . Nausea    HPI Alexandra Mays is a 66 y.o. female.  The history is provided by the patient, a relative and medical records.  Back Pain   This is a new problem. The current episode started 2 days ago. The problem occurs constantly. The problem has been gradually worsening. The pain is associated with no known injury. Pain location: lumbar area. Quality: "throbbing" Radiates to: anterior abdomen. The pain is severe. The pain is the same all the time. Associated symptoms include abdominal pain. Pertinent negatives include no chest pain, no fever, no headaches, no bowel incontinence, no bladder incontinence, no dysuria, no pelvic pain, no paresthesias and no tingling. She has tried nothing for the symptoms. Risk factors: Known AAA.    Past Medical History:  Diagnosis Date  . AAA (abdominal aortic aneurysm) (HCC)   . COPD (chronic obstructive pulmonary disease) (HCC)   . Pure hyperglyceridemia     Patient Active Problem List   Diagnosis Date Noted  . AAA (abdominal aortic aneurysm) without rupture (HCC) 04/21/2015  . Mild cognitive impairment with memory loss 02/17/2015  . Depressive disorder 02/17/2015    Past Surgical History:  Procedure Laterality Date  . cardiac stenting  2007  . NECK SURGERY      OB History    No data available       Home Medications    Prior to Admission medications   Medication Sig Start Date End Date Taking? Authorizing Provider  albuterol (PROVENTIL HFA;VENTOLIN HFA) 108 (90 Base) MCG/ACT inhaler Inhale 2 puffs into the lungs every 6 (six) hours as needed for wheezing or shortness of breath.   Yes Historical Provider, MD  aspirin EC 81 MG tablet Take 81 mg by mouth at bedtime.   Yes Historical Provider, MD  Fluticasone Furoate-Vilanterol (BREO ELLIPTA)  100-25 MCG/INH AEPB Inhale 1 puff into the lungs daily.    Yes Historical Provider, MD  simvastatin (ZOCOR) 20 MG tablet Take 20 mg by mouth daily.   Yes Historical Provider, MD  Tiotropium Bromide Monohydrate (SPIRIVA RESPIMAT) 2.5 MCG/ACT AERS Inhale 2 puffs into the lungs daily.   Yes Historical Provider, MD  polyethylene glycol (MIRALAX / GLYCOLAX) packet Take 17 g by mouth daily. 06/23/16   Forest Becker, MD    Family History Family History  Problem Relation Age of Onset  . Stroke Mother     Social History Social History  Substance Use Topics  . Smoking status: Current Every Day Smoker    Packs/day: 1.00    Years: 50.00    Types: Cigarettes  . Smokeless tobacco: Never Used  . Alcohol use No     Allergies   Patient has no known allergies.   Review of Systems Review of Systems  Constitutional: Negative for chills and fever.  HENT: Negative for ear pain and sore throat.   Eyes: Negative for pain and visual disturbance.  Respiratory: Negative for cough and shortness of breath.   Cardiovascular: Negative for chest pain and palpitations.  Gastrointestinal: Positive for abdominal pain and nausea. Negative for bowel incontinence and vomiting.  Genitourinary: Negative for bladder incontinence, dysuria, hematuria and pelvic pain.  Musculoskeletal: Positive for back pain. Negative for arthralgias.  Skin: Negative for color change and rash.  Neurological: Negative for tingling, seizures, syncope,  headaches and paresthesias.  All other systems reviewed and are negative.    Physical Exam Updated Vital Signs BP 134/85   Pulse 65   Temp 98.1 F (36.7 C) (Oral)   Resp (!) 22   Ht $RemoveBe m)   Wt 42 kg   SpO2 99%   BMI 16.40 kg/m   Physical Exam  Constitutional: She is oriented to person, place, and time. She appears well-developed and well-nourished. No distress.  Thin  HENT:  Head: Normocephalic and atraumatic.  Eyes: Conjunctivae are normal.  Neck: Neck supple.   Cardiovascular: Normal rate and regular rhythm.   No murmur heard. Symmetric 2+ DP pulses  Pulmonary/Chest: Effort normal and breath sounds normal. No respiratory distress.  Abdominal: Soft. There is no tenderness.  Palpable abdominal aortic pulse  Musculoskeletal: She exhibits no edema.  Mild b/l lumbar & lower thoracic paraspinal tenderness, no midline TTP or step-offs  Neurological: She is alert and oriented to person, place, and time.  Moving all 4ext, no focal sensory deficits, able to ambulate in the ED  Skin: Skin is warm and dry.  Psychiatric: She has a normal mood and affect.  Nursing note and vitals reviewed.   ED Treatments / Results  Labs (all labs ordered are listed, but only abnormal results are displayed) Labs Reviewed  COMPREHENSIVE METABOLIC PANEL - Abnormal; Notable for the following:       Result Value   ALT 13 (*)    All other components within normal limits  URINALYSIS, ROUTINE W REFLEX MICROSCOPIC - Abnormal; Notable for the following:    Ketones, ur 5 (*)    All other components within normal limits  LIPASE, BLOOD  CBC    EKG  EKG Interpretation None       Radiology US Aorta  Result Date: 06/23/2016 CLINICAL DATA:  Reported history of abdominal aortic aneurysm. Back pain. EXAM: ULTRASOUND OF ABDOMINAL AORTA TECHNIQUE: Ultrasound examination of the abdominal aorta was performed to evaluate for abdominal aortic aneurysm. COMPARISON:  None. FINDINGS: Abdominal Aorta Atherosclerotic abdominal aorta. Fusiform infrarenal abdominal aortic aneurysm with maximum diameter 4.9 cm. Maximum Diameter: 4.9 cm IMPRESSION: Aortic atherosclerosis. Fusiform infrarenal abdominal aortic aneurysm, maximum diameter 4.9 cm. Recommend followup by abdomen and pelvis CTA in 6 months, and vascular surgery referral/consultation if not already obtained. This recommendation follows ACR consensus guidelines: White Paper of the ACR Incidental Findings Committee II on Vascular  Findings. J Am Coll Radiol 2013; 10:789-794. Electronically Signed   By: Delbert Phenix M.D.   On: 06/23/2016 15:26    Procedures Procedures (including critical care time)  Medications Ordered in ED Medications  iopamidol (ISOVUE-370) 76 % injection (100 mLs  Contrast Given 06/23/16 1645)     Initial Impression / Assessment and Plan / ED Course  I have reviewed the triage vital signs and the nursing notes.  Pertinent labs & imaging results that were available during my care of the patient were reviewed by me and considered in my medical decision making (see chart for details).    Pt with h/o COPD (still an everyday smoker), CAD (s/p stenting of unknown vessel in 2007), & AAA (4.9cm by [place] on 06/15/16) presents with lower back pain. Says the pain started Wednesday night w/o any obvious trigger; it was mild at first, but since then it has worsened, and is now radiating to her abdomen. She describes the pain as "throbbing", severe, constant, and made worse by certain movements. She called her doctor's office and was told  to come to the ED for evaluation.  VS & exam as above. Labs unremarkable. UA w/o signs of infection.  [place] again shows 4.9cm AAA. CTA w/~5.0cm AAA, large colonic stool burden, and spinal stenosis at the L4-5 level.   Vascular Surgery says the Pt is safe for d/c home; recommending f/u with her surgeon in 2 weeks.  Explained all results to the Pt. Will discharge the Pt home with prescription for Miralax. Recommending follow-up with vascular surgery. ED return precautions provided. Pt acknowledged understanding of, and concurrence with the plan. All questions answered to her satisfaction. In stable condition at the time of discharge.  Final Clinical Impressions(s) / ED Diagnoses   Final diagnoses:  Abdominal pain  Acute bilateral low back pain without sciatica  Constipation, unspecified constipation type    New Prescriptions Discharge Medication List as of 06/23/2016  6:12 PM      START taking these medications   Details  polyethylene glycol (MIRALAX / GLYCOLAX) packet Take 17 g by mouth daily., Starting Fri 06/23/2016, Print         Forest Becker, MD 06/24/16 1914    Courteney Randall An, MD 06/25/16 1149

## 2016-06-23 NOTE — Telephone Encounter (Signed)
Phone call from pt's granddaughter.  Reported the pt. has c/o abdominal aching, low back pain, nausea, and increased weakness over last 1-2 days. Stated the pt. "appears pale."  Reported the pt. is describing the pain as "unbearable."  Denied vomiting.  Denied fever/ chills.  Stated after attempting to ease pain with Ibruprofen and heat, "nothing is helping".   Advised granddaughter to take pt. to [facility] ER due to hx of AAA and current symptoms.  Verb. Understanding; agreed.

## 2016-06-23 NOTE — ED Triage Notes (Signed)
Pt c/o abd pain, bil lower back pain onset yesterday, pt c/o nausea, pt sees Fields, MD for abd aneurysm measuring 4.9 mm last week, pt c/o SOB, denies v/d, A&O x4

## 2016-06-29 ENCOUNTER — Other Ambulatory Visit: Payer: Self-pay

## 2016-06-29 ENCOUNTER — Ambulatory Visit (INDEPENDENT_AMBULATORY_CARE_PROVIDER_SITE_OTHER): Payer: Medicare Other | Admitting: Vascular Surgery

## 2016-06-29 ENCOUNTER — Encounter: Payer: Self-pay | Admitting: Vascular Surgery

## 2016-06-29 VITALS — BP 124/78 | HR 86 | Temp 98.1°F | Resp 16 | Ht 63.0 in | Wt 92.0 lb

## 2016-06-29 DIAGNOSIS — Z01810 Encounter for preprocedural cardiovascular examination: Secondary | ICD-10-CM

## 2016-06-29 DIAGNOSIS — I714 Abdominal aortic aneurysm, without rupture, unspecified: Secondary | ICD-10-CM

## 2016-06-29 NOTE — Progress Notes (Signed)
Referring Physician: Dr Josiah Lobo  Patient name: Alexandra Mays MRN: [id number] DOB: 09/25/1950 Sex: female  HPI: Alexandra Mays is a 66 y.o. female seen several weeks ago for abdominal aortic aneurysm. The aneurysm was first noticed in February 2017. At that time was 4.7 cm in diameter. She was recently seen in the emergency room and had a CT scan performed for abdominal and back pain. The aneurysm was 5.1 cm in diameter on that exam. During that ER evaluation she was noted to have severe constipation and chronic back pain from L4-L5 compression. She returns today for further follow-up. She states that her pain is completely resolved. Other medical problems include COPD and elevated cholesterol. She also smokes half a pack of cigarettes per day and has a long smoking history in the past. Greater than 3 minutes they spent regarding smoking cessation counseling.  Past Medical History:  Diagnosis Date  . AAA (abdominal aortic aneurysm) (HCC)   . COPD (chronic obstructive pulmonary disease) (HCC)   . Pure hyperglyceridemia    Past Surgical History:  Procedure Laterality Date  . cardiac stenting  2007  . NECK SURGERY      Family History  Problem Relation Age of Onset  . Stroke Mother     SOCIAL HISTORY: Social History   Social History  . Marital status: Widowed    Spouse name: N/A  . Number of children: N/A  . Years of education: N/A   Occupational History  . Not on file.   Social History Main Topics  . Smoking status: Current Every Day Smoker    Packs/day: 1.00    Years: 50.00    Types: Cigarettes  . Smokeless tobacco: Never Used     Comment: A little less than 1 pk per day  . Alcohol use No  . Drug use: No  . Sexual activity: Not on file   Other Topics Concern  . Not on file   Social History Narrative  . No narrative on file    No Known Allergies  Current Outpatient Prescriptions  Medication Sig Dispense Refill  . albuterol (PROVENTIL HFA;VENTOLIN HFA) 108  (90 Base) MCG/ACT inhaler Inhale 2 puffs into the lungs every 6 (six) hours as needed for wheezing or shortness of breath.    Marland Kitchen aspirin EC 81 MG tablet Take 81 mg by mouth at bedtime.    . Fluticasone Furoate-Vilanterol (BREO ELLIPTA) 100-25 MCG/INH AEPB Inhale 1 puff into the lungs daily.     . polyethylene glycol (MIRALAX / GLYCOLAX) packet Take 17 g by mouth daily. 14 each 0  . simvastatin (ZOCOR) 20 MG tablet Take 20 mg by mouth daily.    . Tiotropium Bromide Monohydrate (SPIRIVA RESPIMAT) 2.5 MCG/ACT AERS Inhale 2 puffs into the lungs daily.     No current facility-administered medications for this visit.     ROS:   General:  No weight loss, Fever, chills  HEENT: No recent headaches, no nasal bleeding, no visual changes, no sore throat  Neurologic: No dizziness, blackouts, seizures. No recent symptoms of stroke or mini- stroke. No recent episodes of slurred speech, or temporary blindness.  Cardiac: No recent episodes of chest pain/pressure, no shortness of breath at rest.  + shortness of breath with exertion.  Denies history of atrial fibrillation or irregular heartbeat  Vascular: No history of rest pain in feet.  No history of claudication.  No history of non-healing ulcer, No history of DVT   Pulmonary: No home oxygen, + productive cough, no  hemoptysis,  + asthma or wheezing  Musculoskeletal:   Arthritis,  Low back pain,   Joint pain  Hematologic:No history of hypercoagulable state.  No history of easy bleeding.  No history of anemia  Gastrointestinal: No hematochezia or melena,  No gastroesophageal reflux, no trouble swallowing  Urinary:  chronic Kidney disease,  on HD -  MWF or  TTHS,  Burning with urination,  Frequent urination,  Difficulty urinating;   Skin: No rashes  Psychological: No history of anxiety,  No history of depression   Physical Examination  Vitals:   06/29/16 0922  BP: 124/78  Pulse: 86  Resp: 16  Temp: 98.1 F (36.7  C)  TempSrc: Oral  SpO2: 96%  Weight: 92 lb (41.7 kg)  Height:  (1.6 m)    Body mass index is 16.3 kg/m.  General:  Alert and oriented, no acute distress HEENT: Normal Neck: No bruit or JVD Pulmonary: Clear to auscultation bilaterally Cardiac: Regular Rate and Rhythm without murmur Abdomen: Soft, non-tender, non-distended, Pulsatile mass best appreciated just to the left of the umbilicus Skin: No rash Extremity Pulses:  2+ radial, brachial, femoral, absent left dorsalis pedis 2+ left posterior tibial pulse, 2+ right dorsalis pedis pulse absent left posterior tibial pulse Musculoskeletal: No deformity or edema  Neurologic: Upper and lower extremity motor 5/5 and symmetric  DATA:  I reviewed the recent CT scan of the chest abdomen and pelvis from [facility] last week. This shows an infrarenal abdominal aortic aneurysm but there is neck tortuosity which I believe would make it difficult for stent graft repair. There is no iliac artery aneurysm component.  There are also 3 renal arteries on the right side.  ASSESSMENT:  Patient was slowly growing infrarenal abdominal aortic aneurysm. Not a candidate for stent graft repair. She will require open repair. I believe the aneurysm is large enough at this time that we should consider repair to avoid possible rupture. Risks benefits possible complications and procedure details including but not limited to bleeding risk of transfusion (20%), myocardial events 5%, infection 1%, death 1%, renal dysfunction 1%, coronary complications 5-10%. The patient understands and agrees to proceed.   PLAN:  We will schedule the patient for cardiology evaluation by Dr. Tomie China and then proceed with repair of her abdominal aortic aneurysm on 07/24/2016 if her cardiac risk is acceptable. I also discussed with her today stopping her cigarette smoking to try to reduce risk of pneumonia and pulmonary complications.   Fabienne Bruns, MD [facility] [place] Office: [phone] Pager: [phone]

## 2016-07-05 ENCOUNTER — Telehealth: Payer: Self-pay | Admitting: Vascular Surgery

## 2016-07-05 NOTE — Telephone Encounter (Signed)
Faxed referral for cardiac clearance to Dr. Kem Parkinson office.

## 2016-07-07 DIAGNOSIS — Z9181 History of falling: Secondary | ICD-10-CM | POA: Diagnosis not present

## 2016-07-07 DIAGNOSIS — Z720 Tobacco use: Secondary | ICD-10-CM | POA: Diagnosis not present

## 2016-07-07 DIAGNOSIS — R636 Underweight: Secondary | ICD-10-CM | POA: Diagnosis not present

## 2016-07-07 DIAGNOSIS — F17200 Nicotine dependence, unspecified, uncomplicated: Secondary | ICD-10-CM | POA: Diagnosis not present

## 2016-07-07 DIAGNOSIS — Z681 Body mass index (BMI) 19 or less, adult: Secondary | ICD-10-CM | POA: Diagnosis not present

## 2016-07-07 DIAGNOSIS — J449 Chronic obstructive pulmonary disease, unspecified: Secondary | ICD-10-CM | POA: Diagnosis not present

## 2016-07-07 DIAGNOSIS — Z716 Tobacco abuse counseling: Secondary | ICD-10-CM | POA: Diagnosis not present

## 2016-07-07 DIAGNOSIS — Z23 Encounter for immunization: Secondary | ICD-10-CM | POA: Diagnosis not present

## 2016-07-07 DIAGNOSIS — I2510 Atherosclerotic heart disease of native coronary artery without angina pectoris: Secondary | ICD-10-CM | POA: Diagnosis not present

## 2016-07-07 DIAGNOSIS — E785 Hyperlipidemia, unspecified: Secondary | ICD-10-CM | POA: Diagnosis not present

## 2016-07-10 ENCOUNTER — Telehealth: Payer: Self-pay | Admitting: Vascular Surgery

## 2016-07-10 NOTE — Telephone Encounter (Signed)
Cardiac Clearance scheduled 07/18/16 @ 1:30pm at Dr. Kem Parkinson office.

## 2016-07-18 DIAGNOSIS — F17210 Nicotine dependence, cigarettes, uncomplicated: Secondary | ICD-10-CM | POA: Diagnosis not present

## 2016-07-18 DIAGNOSIS — I25119 Atherosclerotic heart disease of native coronary artery with unspecified angina pectoris: Secondary | ICD-10-CM | POA: Diagnosis not present

## 2016-07-18 DIAGNOSIS — I714 Abdominal aortic aneurysm, without rupture: Secondary | ICD-10-CM | POA: Diagnosis not present

## 2016-07-18 DIAGNOSIS — E785 Hyperlipidemia, unspecified: Secondary | ICD-10-CM | POA: Diagnosis not present

## 2016-07-18 NOTE — Pre-Procedure Instructions (Signed)
Darol Destineatricia Sawtell  07/18/2016      [id number] [place] - [address] [facility] [address] [place] [address] Phone: [phone] Fax: [phone]    Your procedure is scheduled on 07/24/16.  [facility] Ad[facility] at 530 A.M.  Call this number if you have problems the morning of surgery:  [phone]   Remember:  Do not eat food or drink liquids after midnight.  Take these medicines the morning of surgery with A SIP OF WATER     Inhalers if needed(bring albuterol)  STOP all herbel meds, nsaids (aleve,naproxen,advil,ibuprofen)prior to surgery starting TODAY including all vitamins/supplements   Do not wear jewelry, make-up or nail polish.  Do not wear lotions, powders, or perfumes, or deoderant.  Do not shave 48 hours prior to surgery.  Men may shave face and neck.  Do not bring valuables to the hospital.  [facility] is not responsible for any belongings or valuables.  Contacts, dentures or bridgework may not be worn into surgery.  Leave your suitcase in the car.  After surgery it may be brought to your room.  For patients admitted to the hospital, discharge time will be determined by your treatment team.  Patients discharged the day of surgery will not be allowed to drive home.   Special instructions:   Special Instructions: [place] - Preparing for Surgery  Before surgery, you can play an important role.  Because skin is not sterile, your skin needs to be as free of germs as possible.  You can reduce the number of germs on you skin by washing with CHG (chlorahexidine gluconate) soap before surgery.  CHG is an antiseptic cleaner which kills germs and bonds with the skin to continue killing germs even after washing.  Please DO NOT use if you have an allergy to CHG or antibacterial soaps.  If your skin becomes reddened/irritated stop using the CHG and inform your nurse when you arrive at Short Stay.  Do not  shave (including legs and underarms) for at least 48 hours prior to the first CHG shower.  You may shave your face.  Please follow these instructions carefully:   1.  Shower with CHG Soap the night before surgery and the morning of Surgery.  2.  If you choose to wash your hair, wash your hair first as usual with your normal shampoo.  3.  After you shampoo, rinse your hair and body thoroughly to remove the Shampoo.  4.  Use CHG as you would any other liquid soap.  You can apply chg directly  to the skin and wash gently with scrungie or a clean washcloth.  5.  Apply the CHG Soap to your body ONLY FROM THE NECK DOWN.  Do not use on open wounds or open sores.  Avoid contact with your eyes ears, mouth and genitals (private parts).  Wash genitals (private parts)       with your normal soap.  6.  Wash thoroughly, paying special attention to the area where your surgery will be performed.  7.  Thoroughly rinse your body with warm water from the neck down.  8.  DO NOT shower/wash with your normal soap after using and rinsing off the CHG Soap.  9.  Pat yourself dry with a clean towel.            10.  Wear clean pajamas.  11.  Place clean sheets on your bed the night of your first shower and do not sleep with pets.  Day of Surgery  Do not apply any lotions/deodorants the morning of surgery.  Please wear clean clothes to the hospital/surgery center.  Please read over the  fact sheets that you were given.

## 2016-07-19 ENCOUNTER — Inpatient Hospital Stay (HOSPITAL_COMMUNITY)
Admission: RE | Admit: 2016-07-19 | Discharge: 2016-07-19 | Disposition: A | Discharge status: Home / Self Care | Payer: Medicare Other | Source: Ambulatory Visit

## 2016-07-20 DIAGNOSIS — I2510 Atherosclerotic heart disease of native coronary artery without angina pectoris: Secondary | ICD-10-CM | POA: Diagnosis not present

## 2016-07-20 DIAGNOSIS — R079 Chest pain, unspecified: Secondary | ICD-10-CM | POA: Diagnosis not present

## 2016-07-21 ENCOUNTER — Encounter (HOSPITAL_COMMUNITY): Payer: Self-pay

## 2016-07-21 ENCOUNTER — Encounter (HOSPITAL_COMMUNITY): Payer: Self-pay | Admitting: Certified Registered Nurse Anesthetist

## 2016-07-21 ENCOUNTER — Encounter (HOSPITAL_COMMUNITY)
Admission: RE | Admit: 2016-07-21 | Discharge: 2016-07-21 | Disposition: A | Discharge status: Home / Self Care | Payer: Medicare Other | Source: Ambulatory Visit | Attending: Vascular Surgery | Admitting: Vascular Surgery

## 2016-07-21 HISTORY — DX: Dyspnea, unspecified: R06.00

## 2016-07-21 LAB — URINALYSIS, ROUTINE W REFLEX MICROSCOPIC
BILIRUBIN URINE: NEGATIVE
Glucose, UA: NEGATIVE mg/dL
HGB URINE DIPSTICK: NEGATIVE
KETONES UR: NEGATIVE mg/dL
Leukocytes, UA: NEGATIVE
NITRITE: NEGATIVE
PROTEIN: NEGATIVE mg/dL
SPECIFIC GRAVITY, URINE: 1.004 — AB (ref 1.005–1.030)
pH: 7 (ref 5.0–8.0)

## 2016-07-21 LAB — CBC
HEMATOCRIT: 39.6 % (ref 36.0–46.0)
HEMOGLOBIN: 13.1 g/dL (ref 12.0–15.0)
MCH: 30.8 pg (ref 26.0–34.0)
MCHC: 33.1 g/dL (ref 30.0–36.0)
MCV: 93.2 fL (ref 78.0–100.0)
Platelets: 157 10*3/uL (ref 150–400)
RBC: 4.25 MIL/uL (ref 3.87–5.11)
RDW: 13.4 % (ref 11.5–15.5)
WBC: 8.5 10*3/uL (ref 4.0–10.5)

## 2016-07-21 LAB — COMPREHENSIVE METABOLIC PANEL
ALBUMIN: 4 g/dL (ref 3.5–5.0)
ALT: 15 U/L (ref 14–54)
AST: 25 U/L (ref 15–41)
Alkaline Phosphatase: 63 U/L (ref 38–126)
Anion gap: 9 (ref 5–15)
BILIRUBIN TOTAL: 0.6 mg/dL (ref 0.3–1.2)
BUN: 8 mg/dL (ref 6–20)
CO2: 25 mmol/L (ref 22–32)
Calcium: 9.6 mg/dL (ref 8.9–10.3)
Chloride: 106 mmol/L (ref 101–111)
Creatinine, Ser: 0.73 mg/dL (ref 0.44–1.00)
GFR calc Af Amer: >60 mL/min (ref >60)
GFR calc non Af Amer: >60 mL/min (ref >60)
GLUCOSE: 99 mg/dL (ref 65–99)
POTASSIUM: 3.9 mmol/L (ref 3.5–5.1)
SODIUM: 140 mmol/L (ref 135–145)
TOTAL PROTEIN: 6.9 g/dL (ref 6.5–8.1)

## 2016-07-21 LAB — BLOOD GAS, ARTERIAL
ACID-BASE EXCESS: 0.1 mmol/L (ref 0.0–2.0)
Bicarbonate: 23.8 mmol/L (ref 20.0–28.0)
DRAWN BY: 421801
O2 Saturation: 92.7 %
PH ART: 7.438 (ref 7.350–7.450)
Patient temperature: 98.6
pCO2 arterial: 35.7 mmHg (ref 32.0–48.0)
pO2, Arterial: 63.3 mmHg — ABNORMAL LOW (ref 83.0–108.0)

## 2016-07-21 LAB — PROTIME-INR
INR: 1.06
Prothrombin Time: 13.9 seconds (ref 11.4–15.2)

## 2016-07-21 LAB — ABO/RH: ABO/RH(D): A NEG

## 2016-07-21 LAB — SURGICAL PCR SCREEN
MRSA, PCR: NEGATIVE
Staphylococcus aureus: NEGATIVE

## 2016-07-21 LAB — APTT: aPTT: 35 seconds (ref 24–36)

## 2016-07-21 NOTE — Pre-Procedure Instructions (Signed)
    Alexandra Mays  07/21/2016     Your procedure is scheduled on Monday, May 14.  [facility] Ad[facility] at 5:30 AM                 Your surgery or procedure is scheduled for 7:30 AM   Call this number if you have problems the morning of [phone]                For any other questions, please call [phone], Monday - Friday 8 AM - 4 PM.    Remember:  Do not eat food or drink liquids after midnight Sunday, May 13.  Take these medicines the morning of surgery with A SIP OF WATER: Use Inhalers, bring Albuterol Inhaler to the hospital.                 Aspirin Products (Goody Powder, Excedrin Migraine), Ibuprofen (Advil), Naproxen (Aleve), Vitamins and Herbal Products (ie Fish Oil).   Do not wear jewelry, make-up or nail polish.  Do not wear lotions, powders, or perfumes, or deodorant.  Do not shave 48 hours prior to surgery.  Men may shave face and neck.  Do not bring valuables to the hospital.  [facility] is not responsible for any belongings or valuables.  Contacts, dentures or bridgework may not be worn into surgery.  Leave your suitcase in the car.  After surgery it may be brought to your room.  For patients admitted to the hospital, discharge time will be determined by your treatment team.  Patients discharged the day of surgery will not be allowed to drive home.   Special instructions:  Review  [place] - Preparing For Surgery.  Please read over the following fact sheets that you were given: [facility] and Patient Instructions for Mupirocin Application, Incentive Spirometry, Pain Booklet

## 2016-07-21 NOTE — Progress Notes (Signed)
Anesthesia Chart Review:  Pt is a 66 year old female scheduled for abdominal aortic aneurysm repair on 07/24/2016 with Fabienne Brunsharles fields, M.D.  - PCP is Tanna FurryBrittany Hout, [place] - Cardiologist is Belva Cromeajan Revankar, MD, last office visit 07/18/16 (notes in care everywhere). Stress test ordered, results below. Office visit note states if stress test negative, pt not at high risk for coronary events during surgery.   PMH includes: CAD (by notes, has a stent), AAA, COPD, hyperglyceridemia. Current smoker. BMI 16.  Medications include: Albuterol, Breo Ellipta, simvastatin, Spiriva Respimat  Preoperative labs pending.   EKG 07/18/16: Sinus rhythm. 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  Nuclear stress test 07/20/16 [facility]; found in media tab 07/21/16): 1. No ischemia seen on the scan. 2. Normal gated images. 3. Normal EF calculated to be 65%.  CT angio chest 06/24/16:  - Vascular Impression of the chest: 1. Moderate to large amount of atherosclerotic plaque within a normal caliber thoracic aorta as detailed above. Aortic Atherosclerosis  2. Penetrating atherosclerotic ulcers involving the proximal and distal descending thoracic aorta (as detailed above) without evidence of thoracic aortic dissection or periaortic stranding. - Nonvascular Impression of the chest: 1. An incidental finding of potential clinical significance has been found. Indeterminate approximately 0.9 cm right upper lobe pulmonary nodule.   - Vascular Impression: 1. Large infrarenal abdominal aortic aneurysm measuring approximately 5.4 cm in diameter. The dominant component of the aneurysm contains a large amount of irregular predominantly noncalcified atherosclerotic plaque. No definitive dissection or periaortic stranding. If not previously performed, referral to vascular surgery is recommended. 2. Suspected hemodynamically significant stenoses involving the SMA, bilateral common iliac arteries (left greater than right) and the  right superficial femoral artery as detailed above. - Nonvascular Impression: 1. No acute findings within the abdomen and pelvis. 2. An incidental finding of potential clinical significance has been found. Indeterminate approximately 1.2 cm nodule within the crux/lateral limb of the right adrenal gland.   Echo 04/13/15 [facility]([place] [facility] [place]): 1. LV cavity normal in size. Normal global wall motion. Visual EF 60-65%. Grade I diastolic dysfunction. Cannulated EF 64%. 2. Native trileaflet aortic valve with no regurgitation noted. Mild aortic valve leaflet thickening. No evidence of aortic valve stenosis.  If labs acceptable, I anticipate patient can proceed as scheduled.  Rica Mastngela Lavanya Roa, FNP-BC [facility]Anesthesiology Phone: [phone] 07/21/2016 4:24 PM

## 2016-07-24 ENCOUNTER — Inpatient Hospital Stay (HOSPITAL_COMMUNITY): Payer: Medicare Other | Admitting: Certified Registered Nurse Anesthetist

## 2016-07-24 ENCOUNTER — Inpatient Hospital Stay (HOSPITAL_COMMUNITY): Payer: Medicare Other

## 2016-07-24 ENCOUNTER — Inpatient Hospital Stay (HOSPITAL_COMMUNITY)
Admission: RE | Admit: 2016-07-24 | Discharge: 2016-07-31 | DRG: 269 | Disposition: A | Discharge status: Home Health | Payer: Medicare Other | Source: Ambulatory Visit | Attending: Vascular Surgery | Admitting: Vascular Surgery

## 2016-07-24 ENCOUNTER — Encounter (HOSPITAL_COMMUNITY)
Admission: RE | Disposition: A | Discharge status: Home Health | Payer: Self-pay | Source: Ambulatory Visit | Attending: Vascular Surgery

## 2016-07-24 ENCOUNTER — Encounter (HOSPITAL_COMMUNITY): Payer: Self-pay | Admitting: *Deleted

## 2016-07-24 DIAGNOSIS — J449 Chronic obstructive pulmonary disease, unspecified: Secondary | ICD-10-CM | POA: Diagnosis present

## 2016-07-24 DIAGNOSIS — Z8679 Personal history of other diseases of the circulatory system: Secondary | ICD-10-CM

## 2016-07-24 DIAGNOSIS — I472 Ventricular tachycardia: Secondary | ICD-10-CM | POA: Diagnosis not present

## 2016-07-24 DIAGNOSIS — Z955 Presence of coronary angioplasty implant and graft: Secondary | ICD-10-CM | POA: Diagnosis not present

## 2016-07-24 DIAGNOSIS — Z136 Encounter for screening for cardiovascular disorders: Secondary | ICD-10-CM | POA: Diagnosis not present

## 2016-07-24 DIAGNOSIS — F419 Anxiety disorder, unspecified: Secondary | ICD-10-CM | POA: Diagnosis not present

## 2016-07-24 DIAGNOSIS — R0602 Shortness of breath: Secondary | ICD-10-CM | POA: Diagnosis not present

## 2016-07-24 DIAGNOSIS — F17210 Nicotine dependence, cigarettes, uncomplicated: Secondary | ICD-10-CM | POA: Diagnosis present

## 2016-07-24 DIAGNOSIS — D62 Acute posthemorrhagic anemia: Secondary | ICD-10-CM | POA: Diagnosis not present

## 2016-07-24 DIAGNOSIS — D696 Thrombocytopenia, unspecified: Secondary | ICD-10-CM | POA: Diagnosis not present

## 2016-07-24 DIAGNOSIS — I708 Atherosclerosis of other arteries: Secondary | ICD-10-CM | POA: Diagnosis present

## 2016-07-24 DIAGNOSIS — J9811 Atelectasis: Secondary | ICD-10-CM | POA: Diagnosis not present

## 2016-07-24 DIAGNOSIS — I739 Peripheral vascular disease, unspecified: Secondary | ICD-10-CM | POA: Diagnosis not present

## 2016-07-24 DIAGNOSIS — E7800 Pure hypercholesterolemia, unspecified: Secondary | ICD-10-CM | POA: Diagnosis present

## 2016-07-24 DIAGNOSIS — K6389 Other specified diseases of intestine: Secondary | ICD-10-CM | POA: Diagnosis not present

## 2016-07-24 DIAGNOSIS — I714 Abdominal aortic aneurysm, without rupture, unspecified: Secondary | ICD-10-CM | POA: Diagnosis present

## 2016-07-24 DIAGNOSIS — Z9889 Other specified postprocedural states: Secondary | ICD-10-CM

## 2016-07-24 DIAGNOSIS — Z7982 Long term (current) use of aspirin: Secondary | ICD-10-CM | POA: Diagnosis not present

## 2016-07-24 DIAGNOSIS — I70201 Unspecified atherosclerosis of native arteries of extremities, right leg: Secondary | ICD-10-CM | POA: Diagnosis present

## 2016-07-24 DIAGNOSIS — Z79899 Other long term (current) drug therapy: Secondary | ICD-10-CM

## 2016-07-24 HISTORY — PX: ENDARTERECTOMY: SHX5162

## 2016-07-24 HISTORY — DX: Personal history of other diseases of the circulatory system: Z98.890

## 2016-07-24 HISTORY — PX: ENDARTERECTOMY FEMORAL: SHX5804

## 2016-07-24 HISTORY — DX: Personal history of other diseases of the circulatory system: Z86.79

## 2016-07-24 HISTORY — PX: ABDOMINAL AORTIC ANEURYSM REPAIR: SHX42

## 2016-07-24 LAB — BLOOD GAS, ARTERIAL
ACID-BASE DEFICIT: 4.8 mmol/L — AB (ref 0.0–2.0)
BICARBONATE: 20.4 mmol/L (ref 20.0–28.0)
O2 CONTENT: 2 L/min
O2 Saturation: 95.6 %
PATIENT TEMPERATURE: 97.5
pCO2 arterial: 40.4 mmHg (ref 32.0–48.0)
pH, Arterial: 7.319 — ABNORMAL LOW (ref 7.350–7.450)
pO2, Arterial: 82.5 mmHg — ABNORMAL LOW (ref 83.0–108.0)

## 2016-07-24 LAB — POCT I-STAT 7, (LYTES, BLD GAS, ICA,H+H)
Acid-base deficit: 3 mmol/L — ABNORMAL HIGH (ref 0.0–2.0)
Acid-base deficit: 5 mmol/L — ABNORMAL HIGH (ref 0.0–2.0)
BICARBONATE: 22.4 mmol/L (ref 20.0–28.0)
BICARBONATE: 21.7 mmol/L (ref 20.0–28.0)
CALCIUM ION: 1.08 mmol/L — AB (ref 1.15–1.40)
Calcium, Ion: 1.16 mmol/L (ref 1.15–1.40)
HCT: 29 % — ABNORMAL LOW (ref 36.0–46.0)
HCT: 28 % — ABNORMAL LOW (ref 36.0–46.0)
Hemoglobin: 9.9 g/dL — ABNORMAL LOW (ref 12.0–15.0)
Hemoglobin: 9.5 g/dL — ABNORMAL LOW (ref 12.0–15.0)
O2 SAT: 100 %
O2 SAT: 100 %
PCO2 ART: 37.9 mmHg (ref 32.0–48.0)
PCO2 ART: 46.5 mmHg (ref 32.0–48.0)
PH ART: 7.276 — AB (ref 7.350–7.450)
PO2 ART: 233 mmHg — AB (ref 83.0–108.0)
Patient temperature: 35.6
Patient temperature: 36.6
Potassium: 3.3 mmol/L — ABNORMAL LOW (ref 3.5–5.1)
Potassium: 4.8 mmol/L (ref 3.5–5.1)
Sodium: 141 mmol/L (ref 135–145)
Sodium: 141 mmol/L (ref 135–145)
TCO2: 24 mmol/L (ref 0–100)
TCO2: 23 mmol/L (ref 0–100)
pH, Arterial: 7.374 (ref 7.350–7.450)
pO2, Arterial: 356 mmHg — ABNORMAL HIGH (ref 83.0–108.0)

## 2016-07-24 LAB — BASIC METABOLIC PANEL
ANION GAP: 5 (ref 5–15)
BUN: 10 mg/dL (ref 6–20)
CHLORIDE: 113 mmol/L — AB (ref 101–111)
CO2: 19 mmol/L — AB (ref 22–32)
CREATININE: 0.81 mg/dL (ref 0.44–1.00)
Calcium: 7.3 mg/dL — ABNORMAL LOW (ref 8.9–10.3)
GFR calc non Af Amer: >60 mL/min (ref >60)
Glucose, Bld: 137 mg/dL — ABNORMAL HIGH (ref 65–99)
POTASSIUM: 4.4 mmol/L (ref 3.5–5.1)
SODIUM: 137 mmol/L (ref 135–145)

## 2016-07-24 LAB — CBC
HCT: 38.7 % (ref 36.0–46.0)
HEMOGLOBIN: 13.1 g/dL (ref 12.0–15.0)
MCH: 30.6 pg (ref 26.0–34.0)
MCHC: 33.9 g/dL (ref 30.0–36.0)
MCV: 90.4 fL (ref 78.0–100.0)
Platelets: 88 10*3/uL — ABNORMAL LOW (ref 150–400)
RBC: 4.28 MIL/uL (ref 3.87–5.11)
RDW: 16.8 % — ABNORMAL HIGH (ref 11.5–15.5)
WBC: 11.4 10*3/uL — AB (ref 4.0–10.5)

## 2016-07-24 LAB — POCT I-STAT 4, (NA,K, GLUC, HGB,HCT)
GLUCOSE: 107 mg/dL — AB (ref 65–99)
HCT: 21 % — ABNORMAL LOW (ref 36.0–46.0)
HEMOGLOBIN: 7.1 g/dL — AB (ref 12.0–15.0)
Potassium: 3.6 mmol/L (ref 3.5–5.1)
Sodium: 141 mmol/L (ref 135–145)

## 2016-07-24 LAB — APTT: APTT: 39 s — AB (ref 24–36)

## 2016-07-24 LAB — PREPARE RBC (CROSSMATCH)

## 2016-07-24 LAB — PROTIME-INR
INR: 1.48
PROTHROMBIN TIME: 18.1 s — AB (ref 11.4–15.2)

## 2016-07-24 LAB — MAGNESIUM: Magnesium: 1.6 mg/dL — ABNORMAL LOW (ref 1.7–2.4)

## 2016-07-24 IMAGING — DX DG ABD PORTABLE 1V
1 series · 1 of 1 positions shown · non-contrast
Comparison: None.

CLINICAL DATA: Status post AAA repair

EXAM:
PORTABLE ABDOMEN - 1 VIEW

[abdomen kub]
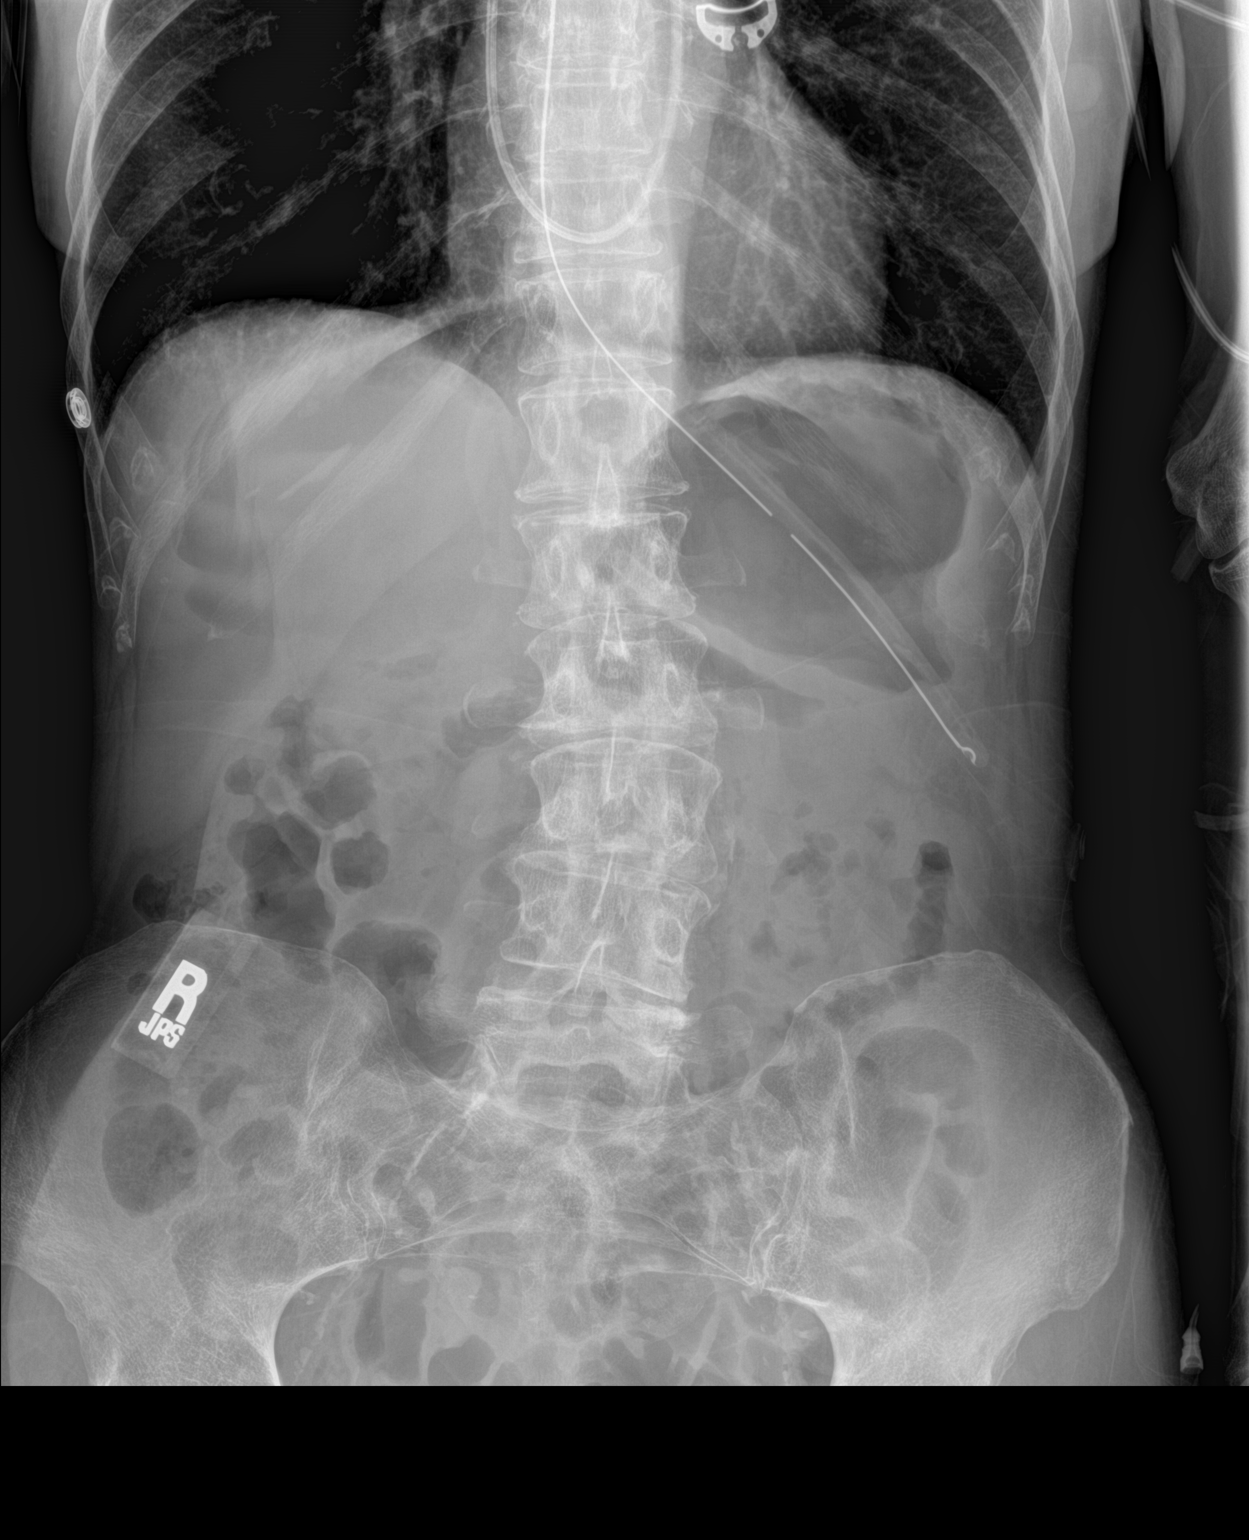

[1 of 1 positions shown; findings below may reference images not displayed]

FINDINGS: Scattered large and small bowel gas is noted. Nasogastric catheter
is noted in the stomach. No abnormal mass is seen. No free air is
noted. Degenerative changes of lumbar spine are seen.
IMPRESSION: No acute abnormality.

## 2016-07-24 IMAGING — DX DG CHEST 1V PORT
1 series · 1 of 1 positions shown · non-contrast
Comparison: None.

CLINICAL DATA: Status post abdominal aortic repair

EXAM:
PORTABLE CHEST 1 VIEW

[chest ap]
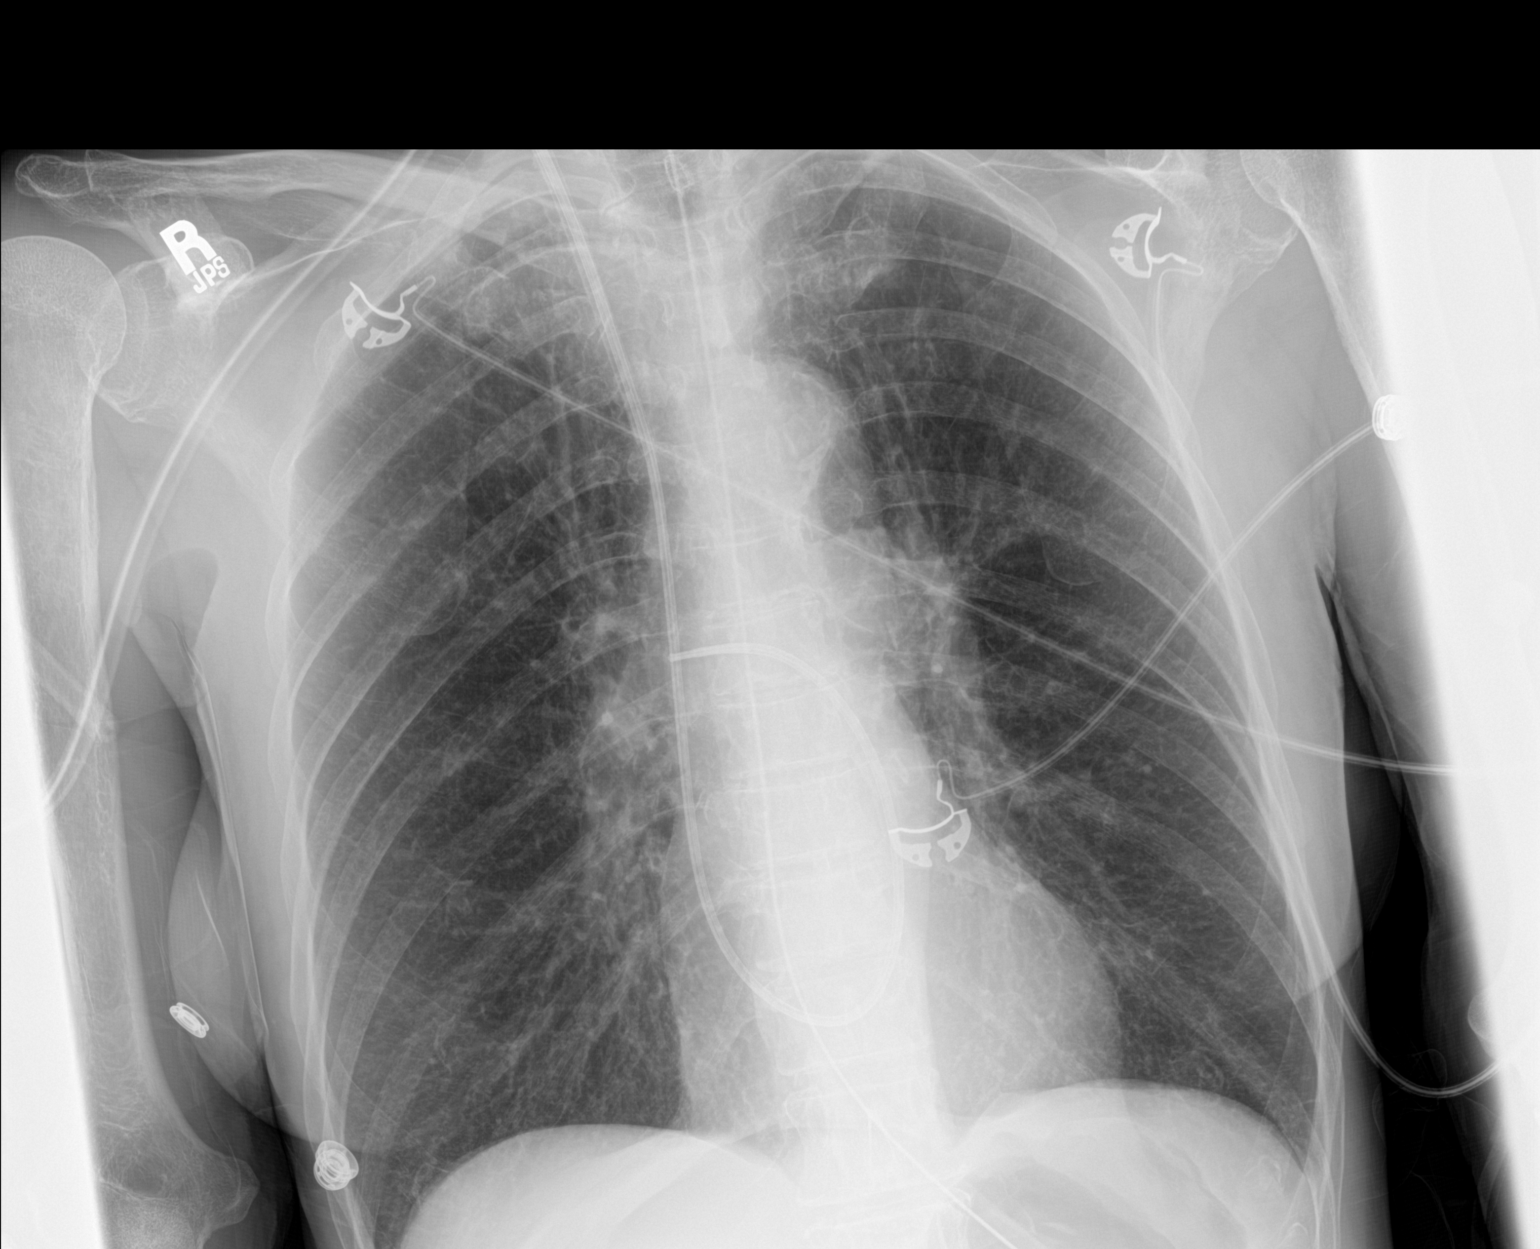

[1 of 1 positions shown; findings below may reference images not displayed]

FINDINGS: Cardiac shadow is within normal limits. The lungs are hyperinflated.
Swan-Ganz catheter is noted within the right pulmonary artery.
Nasogastric catheter is noted extending into the stomach. No bony
abnormality is seen.
IMPRESSION: Tubes and lines as described.

COPD without acute abnormality.

## 2016-07-24 SURGERY — ANEURYSM ABDOMINAL AORTIC REPAIR
Anesthesia: General | Site: Groin | Laterality: Right

## 2016-07-24 MED ORDER — BISACODYL 5 MG PO TBEC: 5.0000 mg | DELAYED_RELEASE_TABLET | Freq: Every day | ORAL | Status: DC | PRN

## 2016-07-24 MED ORDER — SODIUM CHLORIDE 0.9 % IV SOLN: Freq: Once | INTRAVENOUS | Status: DC

## 2016-07-24 MED ORDER — FENTANYL CITRATE (PF) 250 MCG/5ML IJ SOLN
INTRAMUSCULAR | Status: AC
  Filled 2016-07-24: qty 5

## 2016-07-24 MED ORDER — FLUTICASONE FUROATE-VILANTEROL 100-25 MCG/INH IN AEPB
1.0000 | INHALATION_SPRAY | Freq: Every day | RESPIRATORY_TRACT | Status: DC
  Administered 2016-07-25 – 2016-07-31 (×7): 1 via RESPIRATORY_TRACT
  Filled 2016-07-24 (×3): qty 28

## 2016-07-24 MED ORDER — MIDAZOLAM HCL 5 MG/5ML IJ SOLN
INTRAMUSCULAR | Status: DC | PRN
  Administered 2016-07-24 (×2): 1 mg via INTRAVENOUS

## 2016-07-24 MED ORDER — ROCURONIUM BROMIDE 10 MG/ML (PF) SYRINGE
PREFILLED_SYRINGE | INTRAVENOUS | Status: AC
  Filled 2016-07-24: qty 5

## 2016-07-24 MED ORDER — LABETALOL HCL 5 MG/ML IV SOLN
10.0000 mg | INTRAVENOUS | Status: DC | PRN
  Administered 2016-07-24: 5 mg via INTRAVENOUS

## 2016-07-24 MED ORDER — ALUM & MAG HYDROXIDE-SIMETH 200-200-20 MG/5ML PO SUSP
15.0000 mL | ORAL | Status: DC | PRN
  Administered 2016-07-26 – 2016-07-28 (×3): 30 mL via ORAL
  Filled 2016-07-24 (×3): qty 30

## 2016-07-24 MED ORDER — LACTATED RINGERS IV SOLN
INTRAVENOUS | Status: DC | PRN
  Administered 2016-07-24 (×2): via INTRAVENOUS

## 2016-07-24 MED ORDER — PROPOFOL 10 MG/ML IV BOLUS
INTRAVENOUS | Status: AC
  Filled 2016-07-24: qty 40

## 2016-07-24 MED ORDER — ALBUMIN HUMAN 5 % IV SOLN
INTRAVENOUS | Status: DC | PRN
  Administered 2016-07-24 (×3): via INTRAVENOUS

## 2016-07-24 MED ORDER — PHENYLEPHRINE HCL 10 MG/ML IJ SOLN
INTRAMUSCULAR | Status: DC | PRN
  Administered 2016-07-24: 12:00:00 via INTRAVENOUS
  Administered 2016-07-24: 20 ug/min via INTRAVENOUS

## 2016-07-24 MED ORDER — SODIUM CHLORIDE 0.9 % IV SOLN
INTRAVENOUS | Status: DC
  Administered 2016-07-24: 16:00:00 via INTRAVENOUS

## 2016-07-24 MED ORDER — SENNOSIDES-DOCUSATE SODIUM 8.6-50 MG PO TABS: 1.0000 | ORAL_TABLET | Freq: Every evening | ORAL | Status: DC | PRN

## 2016-07-24 MED ORDER — HEPARIN SODIUM (PORCINE) 1000 UNIT/ML IJ SOLN
INTRAMUSCULAR | Status: AC
  Filled 2016-07-24: qty 1

## 2016-07-24 MED ORDER — OXYCODONE-ACETAMINOPHEN 5-325 MG PO TABS: 1.0000 | ORAL_TABLET | ORAL | Status: DC | PRN

## 2016-07-24 MED ORDER — PHENOL 1.4 % MT LIQD
1.0000 | OROMUCOSAL | Status: DC | PRN
  Administered 2016-07-25: 1 via OROMUCOSAL
  Filled 2016-07-24: qty 177

## 2016-07-24 MED ORDER — CHLORHEXIDINE GLUCONATE CLOTH 2 % EX PADS: 6.0000 | MEDICATED_PAD | Freq: Once | CUTANEOUS | Status: DC

## 2016-07-24 MED ORDER — SODIUM CHLORIDE 0.9 % IV SOLN: 500.0000 mL | Freq: Once | INTRAVENOUS | Status: DC | PRN

## 2016-07-24 MED ORDER — PHENYLEPHRINE 40 MCG/ML (10ML) SYRINGE FOR IV PUSH (FOR BLOOD PRESSURE SUPPORT)
PREFILLED_SYRINGE | INTRAVENOUS | Status: AC
  Filled 2016-07-24: qty 10

## 2016-07-24 MED ORDER — PROPOFOL 10 MG/ML IV BOLUS
INTRAVENOUS | Status: DC | PRN
  Administered 2016-07-24: 100 mg via INTRAVENOUS

## 2016-07-24 MED ORDER — POTASSIUM CHLORIDE CRYS ER 20 MEQ PO TBCR: 20.0000 meq | EXTENDED_RELEASE_TABLET | Freq: Every day | ORAL | Status: DC | PRN

## 2016-07-24 MED ORDER — CHLORHEXIDINE GLUCONATE CLOTH 2 % EX PADS
6.0000 | MEDICATED_PAD | Freq: Every day | CUTANEOUS | Status: DC
  Administered 2016-07-24 – 2016-07-26 (×3): 6 via TOPICAL

## 2016-07-24 MED ORDER — PROTAMINE SULFATE 10 MG/ML IV SOLN
INTRAVENOUS | Status: AC
  Filled 2016-07-24: qty 5

## 2016-07-24 MED ORDER — ONDANSETRON HCL 4 MG/2ML IJ SOLN: 4.0000 mg | Freq: Once | INTRAMUSCULAR | Status: DC | PRN

## 2016-07-24 MED ORDER — SODIUM CHLORIDE 0.9 % IV SOLN
INTRAVENOUS | Status: DC | PRN
  Administered 2016-07-24: 500 mL

## 2016-07-24 MED ORDER — PHENYLEPHRINE 40 MCG/ML (10ML) SYRINGE FOR IV PUSH (FOR BLOOD PRESSURE SUPPORT)
PREFILLED_SYRINGE | INTRAVENOUS | Status: DC | PRN
  Administered 2016-07-24: 80 ug via INTRAVENOUS
  Administered 2016-07-24: 40 ug via INTRAVENOUS
  Administered 2016-07-24: 120 ug via INTRAVENOUS

## 2016-07-24 MED ORDER — DEXTROSE 5 % IV SOLN
1.5000 g | INTRAVENOUS | Status: AC
  Administered 2016-07-24: 1.5 g via INTRAVENOUS
  Filled 2016-07-24: qty 1.5

## 2016-07-24 MED ORDER — SODIUM CHLORIDE 0.9 % IV SOLN
INTRAVENOUS | Status: DC | PRN
  Administered 2016-07-24: 11:00:00 via INTRAVENOUS

## 2016-07-24 MED ORDER — ROCURONIUM BROMIDE 10 MG/ML (PF) SYRINGE
PREFILLED_SYRINGE | INTRAVENOUS | Status: DC | PRN
  Administered 2016-07-24 (×2): 20 mg via INTRAVENOUS
  Administered 2016-07-24: 50 mg via INTRAVENOUS

## 2016-07-24 MED ORDER — PROTAMINE SULFATE 10 MG/ML IV SOLN
INTRAVENOUS | Status: DC | PRN
  Administered 2016-07-24: 10 mg via INTRAVENOUS
  Administered 2016-07-24: 40 mg via INTRAVENOUS

## 2016-07-24 MED ORDER — FENTANYL CITRATE (PF) 100 MCG/2ML IJ SOLN
INTRAMUSCULAR | Status: AC
  Filled 2016-07-24: qty 2

## 2016-07-24 MED ORDER — SIMVASTATIN 20 MG PO TABS
20.0000 mg | ORAL_TABLET | Freq: Every evening | ORAL | Status: DC
  Administered 2016-07-26 – 2016-07-30 (×5): 20 mg via ORAL
  Filled 2016-07-24 (×5): qty 1

## 2016-07-24 MED ORDER — LABETALOL HCL 5 MG/ML IV SOLN
INTRAVENOUS | Status: AC
  Filled 2016-07-24: qty 4

## 2016-07-24 MED ORDER — SODIUM CHLORIDE 0.9% FLUSH
10.0000 mL | Freq: Two times a day (BID) | INTRAVENOUS | Status: DC
  Administered 2016-07-24 – 2016-07-27 (×5): 10 mL

## 2016-07-24 MED ORDER — LACTATED RINGERS IV SOLN
INTRAVENOUS | Status: DC | PRN
  Administered 2016-07-24: 07:00:00 via INTRAVENOUS

## 2016-07-24 MED ORDER — ONDANSETRON HCL 4 MG/2ML IJ SOLN
INTRAMUSCULAR | Status: AC
  Filled 2016-07-24: qty 2

## 2016-07-24 MED ORDER — GUAIFENESIN-DM 100-10 MG/5ML PO SYRP: 15.0000 mL | ORAL_SOLUTION | ORAL | Status: DC | PRN

## 2016-07-24 MED ORDER — KCL IN DEXTROSE-NACL 20-5-0.45 MEQ/L-%-% IV SOLN
INTRAVENOUS | Status: DC
  Administered 2016-07-24 – 2016-07-29 (×7): via INTRAVENOUS
  Filled 2016-07-24 (×10): qty 1000

## 2016-07-24 MED ORDER — HEPARIN SODIUM (PORCINE) 1000 UNIT/ML IJ SOLN
INTRAMUSCULAR | Status: DC | PRN
  Administered 2016-07-24 (×2): 5000 [IU] via INTRAVENOUS

## 2016-07-24 MED ORDER — 0.9 % SODIUM CHLORIDE (POUR BTL) OPTIME
TOPICAL | Status: DC | PRN
  Administered 2016-07-24: 3000 mL

## 2016-07-24 MED ORDER — PANTOPRAZOLE SODIUM 40 MG PO TBEC
40.0000 mg | DELAYED_RELEASE_TABLET | Freq: Every day | ORAL | Status: DC
  Administered 2016-07-25 – 2016-07-31 (×6): 40 mg via ORAL
  Filled 2016-07-24 (×6): qty 1

## 2016-07-24 MED ORDER — ACETAMINOPHEN 325 MG PO TABS
325.0000 mg | ORAL_TABLET | ORAL | Status: DC | PRN
Start: 2016-07-24 — End: 2016-07-31

## 2016-07-24 MED ORDER — METOPROLOL TARTRATE 5 MG/5ML IV SOLN: 2.0000 mg | INTRAVENOUS | Status: DC | PRN

## 2016-07-24 MED ORDER — ARTIFICIAL TEARS OPHTHALMIC OINT
TOPICAL_OINTMENT | OPHTHALMIC | Status: AC
  Filled 2016-07-24: qty 3.5

## 2016-07-24 MED ORDER — MIDAZOLAM HCL 2 MG/2ML IJ SOLN
INTRAMUSCULAR | Status: AC
  Filled 2016-07-24: qty 2

## 2016-07-24 MED ORDER — TIOTROPIUM BROMIDE MONOHYDRATE 18 MCG IN CAPS
18.0000 ug | ORAL_CAPSULE | Freq: Every day | RESPIRATORY_TRACT | Status: DC
  Administered 2016-07-25 – 2016-07-31 (×7): 18 ug via RESPIRATORY_TRACT
  Filled 2016-07-24 (×3): qty 5

## 2016-07-24 MED ORDER — ALBUTEROL SULFATE (2.5 MG/3ML) 0.083% IN NEBU
3.0000 mL | INHALATION_SOLUTION | Freq: Four times a day (QID) | RESPIRATORY_TRACT | Status: DC | PRN
  Administered 2016-07-25 – 2016-07-27 (×5): 3 mL via RESPIRATORY_TRACT
  Filled 2016-07-24 (×6): qty 3

## 2016-07-24 MED ORDER — DEXTROSE 5 % IV SOLN
1.5000 g | Freq: Two times a day (BID) | INTRAVENOUS | Status: AC
  Administered 2016-07-24 – 2016-07-25 (×2): 1.5 g via INTRAVENOUS
  Filled 2016-07-24 (×3): qty 1.5

## 2016-07-24 MED ORDER — MANNITOL 20 % IV SOLN
25.0000 g | Freq: Once | INTRAVENOUS | Status: DC
  Filled 2016-07-24: qty 125

## 2016-07-24 MED ORDER — SODIUM CHLORIDE 0.9% FLUSH: 10.0000 mL | INTRAVENOUS | Status: DC | PRN

## 2016-07-24 MED ORDER — MAGNESIUM SULFATE 2 GM/50ML IV SOLN
2.0000 g | Freq: Every day | INTRAVENOUS | Status: AC | PRN
  Administered 2016-07-25: 2 g via INTRAVENOUS
  Filled 2016-07-24: qty 50

## 2016-07-24 MED ORDER — DOCUSATE SODIUM 100 MG PO CAPS
100.0000 mg | ORAL_CAPSULE | Freq: Every day | ORAL | Status: DC
  Administered 2016-07-25 – 2016-07-31 (×5): 100 mg via ORAL
  Filled 2016-07-24 (×5): qty 1

## 2016-07-24 MED ORDER — ENOXAPARIN SODIUM 40 MG/0.4ML ~~LOC~~ SOLN
40.0000 mg | SUBCUTANEOUS | Status: DC
  Administered 2016-07-25: 40 mg via SUBCUTANEOUS
  Filled 2016-07-24: qty 0.4

## 2016-07-24 MED ORDER — HYDRALAZINE HCL 20 MG/ML IJ SOLN
5.0000 mg | INTRAMUSCULAR | Status: DC | PRN
  Administered 2016-07-24: 5 mg via INTRAVENOUS
  Filled 2016-07-24: qty 1

## 2016-07-24 MED ORDER — ORAL CARE MOUTH RINSE
15.0000 mL | Freq: Two times a day (BID) | OROMUCOSAL | Status: DC
  Administered 2016-07-24 – 2016-07-31 (×9): 15 mL via OROMUCOSAL

## 2016-07-24 MED ORDER — ACETAMINOPHEN 325 MG RE SUPP: 325.0000 mg | RECTAL | Status: DC | PRN

## 2016-07-24 MED ORDER — SUGAMMADEX SODIUM 200 MG/2ML IV SOLN
INTRAVENOUS | Status: AC
  Filled 2016-07-24: qty 2

## 2016-07-24 MED ORDER — HYDROMORPHONE HCL 1 MG/ML IJ SOLN
INTRAMUSCULAR | Status: AC
  Filled 2016-07-24: qty 1

## 2016-07-24 MED ORDER — NITROGLYCERIN IN D5W 200-5 MCG/ML-% IV SOLN
0.0000 ug/min | Freq: Once | INTRAVENOUS | Status: DC
  Filled 2016-07-24: qty 250

## 2016-07-24 MED ORDER — ONDANSETRON HCL 4 MG/2ML IJ SOLN
INTRAMUSCULAR | Status: DC | PRN
  Administered 2016-07-24: 4 mg via INTRAVENOUS

## 2016-07-24 MED ORDER — FENTANYL CITRATE (PF) 100 MCG/2ML IJ SOLN
25.0000 ug | INTRAMUSCULAR | Status: DC | PRN
  Administered 2016-07-24 (×2): 25 ug via INTRAVENOUS

## 2016-07-24 MED ORDER — ONDANSETRON HCL 4 MG/2ML IJ SOLN
4.0000 mg | Freq: Four times a day (QID) | INTRAMUSCULAR | Status: DC | PRN
  Administered 2016-07-25: 4 mg via INTRAVENOUS
  Filled 2016-07-24: qty 2

## 2016-07-24 MED ORDER — ARTIFICIAL TEARS OPHTHALMIC OINT
TOPICAL_OINTMENT | OPHTHALMIC | Status: DC | PRN
  Administered 2016-07-24: 1 via OPHTHALMIC

## 2016-07-24 MED ORDER — HYDROMORPHONE HCL 1 MG/ML IJ SOLN
0.5000 mg | INTRAMUSCULAR | Status: DC | PRN
  Administered 2016-07-24: 1 mg via INTRAVENOUS
  Administered 2016-07-24: 0.5 mg via INTRAVENOUS
  Administered 2016-07-24: 1 mg via INTRAVENOUS
  Administered 2016-07-25: 0.5 mg via INTRAVENOUS
  Administered 2016-07-25 – 2016-07-30 (×3): 1 mg via INTRAVENOUS
  Filled 2016-07-24 (×6): qty 1

## 2016-07-24 MED ORDER — FENTANYL CITRATE (PF) 100 MCG/2ML IJ SOLN
INTRAMUSCULAR | Status: DC | PRN
  Administered 2016-07-24 (×6): 50 ug via INTRAVENOUS
  Administered 2016-07-24: 200 ug via INTRAVENOUS

## 2016-07-24 MED ORDER — SUGAMMADEX SODIUM 200 MG/2ML IV SOLN
INTRAVENOUS | Status: DC | PRN
  Administered 2016-07-24: 82.6 mg via INTRAVENOUS

## 2016-07-24 MED FILL — Heparin Sodium (Porcine) Inj 1000 Unit/ML: INTRAMUSCULAR | Qty: 30 | Status: AC

## 2016-07-24 MED FILL — Electrolyte-R (PH 7.4) Solution: INTRAVENOUS | Qty: 2000 | Status: AC

## 2016-07-24 SURGICAL SUPPLY — 64 items
ATTRACTOMAT 16X20 MAGNETIC DRP (DRAPES) ×4 IMPLANT
CANISTER SUCT 3000ML PPV (MISCELLANEOUS) ×4 IMPLANT
CANNULA VESSEL 3MM 2 BLNT TIP (CANNULA) ×8 IMPLANT
CLIP TI MEDIUM 24 (CLIP) ×4 IMPLANT
CLIP TI WIDE RED SMALL 24 (CLIP) ×4 IMPLANT
DERMABOND ADVANCED (GAUZE/BANDAGES/DRESSINGS) ×2
DERMABOND ADVANCED .7 DNX12 (GAUZE/BANDAGES/DRESSINGS) ×6 IMPLANT
ELECT BLADE 4.0 EZ CLEAN MEGAD (MISCELLANEOUS) ×4
ELECT BLADE 6.5 EXT (BLADE) IMPLANT
ELECT REM PT RETURN 9FT ADLT (ELECTROSURGICAL) ×4
ELECTRODE BLDE 4.0 EZ CLN MEGD (MISCELLANEOUS) ×3 IMPLANT
ELECTRODE REM PT RTRN 9FT ADLT (ELECTROSURGICAL) ×3 IMPLANT
FELT TEFLON 1X6 (MISCELLANEOUS) ×4 IMPLANT
FELT TEFLON 4 X1 (MISCELLANEOUS) IMPLANT
GAUZE SPONGE 4X4 16PLY XRAY LF (GAUZE/BANDAGES/DRESSINGS) ×4 IMPLANT
GLOVE BIO SURGEON STRL SZ 6.5 (GLOVE) ×4 IMPLANT
GLOVE BIO SURGEON STRL SZ7 (GLOVE) ×4 IMPLANT
GLOVE BIO SURGEON STRL SZ7.5 (GLOVE) ×4 IMPLANT
GLOVE BIOGEL PI IND STRL 6 (GLOVE) ×3 IMPLANT
GLOVE BIOGEL PI IND STRL 6.5 (GLOVE) ×12 IMPLANT
GLOVE BIOGEL PI IND STRL 7.5 (GLOVE) ×3 IMPLANT
GLOVE BIOGEL PI IND STRL 8.5 (GLOVE) ×3 IMPLANT
GLOVE BIOGEL PI INDICATOR 6 (GLOVE) ×1
GLOVE BIOGEL PI INDICATOR 6.5 (GLOVE) ×4
GLOVE BIOGEL PI INDICATOR 7.5 (GLOVE) ×1
GLOVE BIOGEL PI INDICATOR 8.5 (GLOVE) ×1
GLOVE ECLIPSE 7.0 STRL STRAW (GLOVE) ×4 IMPLANT
GOWN STRL REUS W/ TWL LRG LVL3 (GOWN DISPOSABLE) ×18 IMPLANT
GOWN STRL REUS W/TWL LRG LVL3 (GOWN DISPOSABLE) ×6
GRAFT HEMASHIELD 18X9MM (Vascular Products) ×4 IMPLANT
INSERT FOGARTY 61MM (MISCELLANEOUS) ×8 IMPLANT
INSERT FOGARTY SM (MISCELLANEOUS) ×16 IMPLANT
KIT BASIN OR (CUSTOM PROCEDURE TRAY) ×4 IMPLANT
KIT ROOM TURNOVER OR (KITS) ×4 IMPLANT
LOOP VESSEL MAXI BLUE (MISCELLANEOUS) ×4 IMPLANT
LOOP VESSEL MINI RED (MISCELLANEOUS) IMPLANT
NS IRRIG 1000ML POUR BTL (IV SOLUTION) ×12 IMPLANT
PACK AORTA (CUSTOM PROCEDURE TRAY) ×4 IMPLANT
PAD ARMBOARD 7.5X6 YLW CONV (MISCELLANEOUS) ×8 IMPLANT
RETAINER VISCERA MED (MISCELLANEOUS) ×4 IMPLANT
SPONGE LAP 18X18 X RAY DECT (DISPOSABLE) ×4 IMPLANT
SPONGE SURGIFOAM ABS GEL 100 (HEMOSTASIS) IMPLANT
STAPLER VISISTAT 35W (STAPLE) ×4 IMPLANT
SUT PDS AB 1 TP1 54 (SUTURE) ×8 IMPLANT
SUT PROLENE 3 0 SH DA (SUTURE) ×4 IMPLANT
SUT PROLENE 3 0 SH1 36 (SUTURE) ×12 IMPLANT
SUT PROLENE 5 0 C 1 24 (SUTURE) ×4 IMPLANT
SUT PROLENE 5 0 CC 1 (SUTURE) ×12 IMPLANT
SUT PROLENE 6 0 C 1 30 (SUTURE) ×4 IMPLANT
SUT PROLENE 6 0 CC (SUTURE) ×16 IMPLANT
SUT SILK 2 0 (SUTURE) ×1
SUT SILK 2 0SH CR/8 30 (SUTURE) ×4 IMPLANT
SUT SILK 2-0 18XBRD TIE 12 (SUTURE) ×3 IMPLANT
SUT SILK 3 0 (SUTURE) ×1
SUT SILK 3-0 18XBRD TIE 12 (SUTURE) ×3 IMPLANT
SUT VIC AB 2-0 CT1 27 (SUTURE) ×2
SUT VIC AB 2-0 CT1 TAPERPNT 27 (SUTURE) ×6 IMPLANT
SUT VIC AB 2-0 SH 27 (SUTURE)
SUT VIC AB 2-0 SH 27XBRD (SUTURE) IMPLANT
SUT VIC AB 3-0 SH 27 (SUTURE) ×3
SUT VIC AB 3-0 SH 27X BRD (SUTURE) ×9 IMPLANT
SUT VIC AB 4-0 PS2 27 (SUTURE) ×8 IMPLANT
TRAY FOLEY W/METER SILVER 16FR (SET/KITS/TRAYS/PACK) ×4 IMPLANT
WATER STERILE IRR 1000ML POUR (IV SOLUTION) ×8 IMPLANT

## 2016-07-24 NOTE — Progress Notes (Signed)
In PACU easily arousable  BP 140s 2+ DP pulses No groin hematoma  Fabienne Brunsharles Fields, MD [facility] Office: [phone] Pager: [phone]

## 2016-07-24 NOTE — Interval H&P Note (Signed)
History and Physical Interval Note:  07/24/2016 7:24 AM  Alexandra LeanPatricia A Perman  has presented today for surgery, with the diagnosis of Abdominal aortic aneurysm I71.4  The various methods of treatment have been discussed with the patient and family. After consideration of risks, benefits and other options for treatment, the patient has consented to  Procedure(s): ANEURYSM ABDOMINAL AORTIC REPAIR (N/A) as a surgical intervention .  The patient's history has been reviewed, patient examined, no change in status, stable for surgery.  I have reviewed the patient's chart and labs.  Questions were answered to the patient's satisfaction.     Fabienne BrunsFields, Duke Weisensel

## 2016-07-24 NOTE — Transfer of Care (Signed)
Immediate Anesthesia Transfer of Care Note  Patient: Alexandra Mays  Procedure(s) Performed: Procedure(s): ABDOMINAL AORTIC ANEURYSM  REPAIR (N/A) AORTA TO LEFT ILIAC ENDARTERECTOMY (Left) AORTA TO RIGHT FEMORAL ENDARTERECTOMY (Right)  Patient Location: PACU  Anesthesia Type:General  Level of Consciousness: awake and alert   Airway & Oxygen Therapy: Patient Spontanous Breathing and Patient connected to face mask oxygen  Post-op Assessment: Report given to RN, Post -op Vital signs reviewed and stable and Patient moving all extremities X 4  Post vital signs: Reviewed and stable  Last Vitals:  Vitals:   07/24/16 0556  BP: (!) 122/59  Pulse: 65  Resp: 16  Temp: 36.6 C    Last Pain: There were no vitals filed for this visit.       Complications: No apparent anesthesia complications

## 2016-07-24 NOTE — Anesthesia Postprocedure Evaluation (Addendum)
Anesthesia Post Note  Patient: Chauncy Leanatricia A Tellez  Procedure(s) Performed: Procedure(s) (LRB): ABDOMINAL AORTIC ANEURYSM  REPAIR (N/A) AORTA TO LEFT ILIAC ENDARTERECTOMY (Left) AORTA TO RIGHT FEMORAL ENDARTERECTOMY (Right)  Patient location during evaluation: PACU Anesthesia Type: General Level of consciousness: awake, awake and alert and oriented Pain management: pain level controlled Vital Signs Assessment: post-procedure vital signs reviewed and stable Respiratory status: spontaneous breathing, nonlabored ventilation and respiratory function stable Cardiovascular status: blood pressure returned to baseline Anesthetic complications: no       Last Vitals:  Vitals:   07/24/16 1630 07/24/16 1645  BP:    Pulse: 66 66  Resp: 11 11  Temp: 36.5 C 36.6 C    Last Pain:  Vitals:   07/24/16 1600  TempSrc: Core (Comment)  PainSc:                  Shaya Reddick COKER

## 2016-07-24 NOTE — Anesthesia Procedure Notes (Addendum)
Central Venous Catheter Insertion Performed by: Roderic Palau, anesthesiologist Start/End5/14/2018 6:38 AM, 07/24/2016 6:48 AM Patient location: Pre-op. Preanesthetic checklist: patient identified, IV checked, site marked, risks and benefits discussed, surgical consent, monitors and equipment checked, pre-op evaluation, timeout performed and anesthesia consent Position: Trendelenburg Lidocaine 1% used for infiltration and patient sedated Hand hygiene performed , maximum sterile barriers used  and Seldinger technique used Catheter size: 9 Fr Total catheter length 10. Central line and PA cath was placed.MAC introducer Swan type:thermodilution PA Cath depth:50 Procedure performed using ultrasound guided technique. Ultrasound Notes:anatomy identified, needle tip was noted to be adjacent to the nerve/plexus identified, no ultrasound evidence of intravascular and/or intraneural injection and image(s) printed for medical record Attempts: 1 Following insertion, line sutured and dressing applied. Post procedure assessment: blood return through all ports, free fluid flow and no air  Patient tolerated the procedure well with no immediate complications.

## 2016-07-24 NOTE — Op Note (Signed)
Procedure: Aorto right femoral left common iliac artery bypass for abdominal aortic aneurysm, left common iliac endarterectomy, right common femoral endarterectomy  Preoperative diagnosis: Abdominal aortic aneurysm. Postoperative diagnosis: Same  Anesthesia: Gen.  Assistant: Cari Carawayhris Dickson M.D., [facility]ureen Collins PA-C  Operative findings: #1 severe calcific iliac disease bilaterally #2 18 x 9 mm Dacron graft right limb to common femoral left limb to left common iliac  Operative details: After obtaining informed consent, the patient was taken to the operating room. The patient was placed in supine position operating table. After induction of general anesthesia and endotracheal intubation, the patient was prepped and draped in usual sterile fashion from the nipples to the knees. A nasogastric tube was inserted. A Foley catheter was placed. Sterile prep and drape was performed. Midline laparotomy incision was then performed extending from the xiphoid down to the pubis. Incision was carried to the subcutaneous tissues down to level of fascia. The fascia and peritoneum were incised for the full length incision. Upon entering the abdomen the small bowel was reflected to the right transverse colon and omentum reflected superiorly dissection was carried down to the level of the abdominal aorta. The retroperitoneum was opened all the way up to the level left renal vein. An Omni retractor was then placed at this point for assistance and retraction. The left renal vein was identified and pushed slightly superior using the Omni-Tract. The inferior aspect of the left and right renal arteries were identified and protected. The aorta was dissected free circumferentially just below the level of the right and left renal arteries. It had a good neck at this location. Dissection was carried down to level the inferior mesenteric artery. He was dissected free circumferentially and a vessel loop placed around this. Dissection was  then carried out the level left common iliac artery. This was anteriorly fairly soft but dense calcific plaque posteriorly. I was able to find a reasonably soft spot for clamping just above the iliac bifurcation on the left. This was dissected free circumferentially and a vessel loop placed around this. On the right side, the right common iliac artery was severely calcified much worse than the left. Dissection was carried down to level iliac bifurcation. There was a fairly soft area on the anterior wall just above the iliac bifurcation but this was much more densely calcified than the left. I did dissect out the internal iliac and external iliacs on the right side the possibility that we were able to graft I could clamp in this area safely. At this point the patient was given 5000 units of intravenous heparin. It should be noted that the patient was given an additional 5000 units of heparin during the course of the case. The aorta was clamped infrarenally just below the left or right renal arteries with a Harken clamp. The right external and internal iliac arteries were controlled with angled calorics. The left common iliac artery was controlled with a Henley clamp. A longitudinal opening was made in the aorta just above the aortic bifurcation. This was carried up to the level aortic neck. Large amount of thrombus was removed from the aorta. There were no real significant lumbars bleeding except for one small middle sacral artery at the bifurcation. I noted at this point that the aorta] furcation was too densely calcified to consider a tube graft. I then transected the left common iliac artery a few centimeters above the bifurcation and it appeared to be syllable at this level. Attention was then turned back to the  aortic neck. A much of loose aortic debris was removed. An 18 x 9 mm Dacron graft was then selected and sewn end-to-end to the aorta using a running 3-0 Prolene suture. Just prior completion of the  anastomosis it was tested and found to have one area of leak on the anterior wall and this was repaired with a single horizontal mattress pledgeted stitch. The graft was then unclamped and angled DeBakey clamps placed on each limb of the graft. There was good hemostasis at the proximal anastomosis. Left limb of the graft was then pulled down to the left common iliac artery. I began to sew this anastomosis after cutting the graft to length. However, the posterior wall was too densely calcified to sew so I performed an iliac endarterectomy and got a pretty good distal endpoint as it entered the external and internal iliac arteries. I then sewed this limb of the graft end-to-end to the common iliac arteries are running 5-0 Prolene suture. Prior completion of the anastomosis was forebled backbled and thoroughly flushed. Anastomosis was secured clamps released there was a brief pressure dropped to about 20 mmHg which then quickly recovered. Attention was then turned to the right side. I did not feel I would be able to sew to the common or external iliac artery on the right side. Therefore a longitudinal incision was made in the right groin and carried into the subtenon's tissues down level the right common femoral artery. This had a large amount of posterior plaque as well. Femoral and profunda femoris arteries dissected free circumferentially and vessel loops placed around these. A tunnel was then created into the retroperitoneum retroureteral retrocolic up to the level of the graft and the graft pulled down into the right groin. The common femoral artery was controlled proximally with a Henley clamp and distally with Vesseloops. Longitudinal opening was made in the right common femoral artery and again there was a large amount of posterior plaque and I felt that endarterectomy would be necessary so that there was a reasonable lumen in this location. Common femoral endarterectomy was obtained and I endarterectomized the  distal external iliac artery with eversion technique. I was able to get good distal endpoints on the profunda and superficial femoral arteries. The segment good backbleeding. Graft was then cut to length and sewn end of graft to side of arteries in a running 6-0 Prolene suture. Just prior completion anastomosis was forebled backbled and thoroughly flushed. Anastomosis was secured clamps released there was some bleeding from the lateral wall and this was repaired with 2 6-0 Prolene sutures. Hemostasis was then obtained. I then proceeded to ligate the right common iliac artery but first I had to endarterectomize this to make a reasonable spot to ligated with a 0 silk tie. There was retrograde flow into the right external and internal iliac artery at this point. Patient a good Doppler signals in both feet at this point. The heparin was partially reversed with 50 mg of protamine. 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 After hemostasis was obtained viscera were returned to normal position the small bowel and colon were both well perfused and pink. The retroperitoneum and aortic sac were closed with running 2-0 Vicryl's sutures. The fascia was then closed with a running #1 PDS suture. The skin was closed with a 4 Vicryl subcuticular stitch. The right groin was then closed in multiple layers of running 3-0 Vicryl suture. Skin was  closed with 4-0 Vicryl subcuticular stitch. Dermabond was applied to both incisions. The patient tolerated the procedure well and there were no complications. Instrument sponge and needle count was correct in the case. Patient's feet were pink warm with posterior tibial and dorsalis pedis Doppler signals bilaterally at the end of the case. Patient was extubated in the operating room taken to recovery room in stable condition.   Fabienne Bruns, MD [facility] [place] Office:  [phone] Pager: [phone]

## 2016-07-24 NOTE — Anesthesia Preprocedure Evaluation (Addendum)
Anesthesia Evaluation  Patient identified by MRN, date of birth, ID band Patient awake    Reviewed: Allergy & Precautions, NPO status , Patient's Chart, lab work & pertinent test results  Airway Mallampati: II  TM Distance: >3 FB Neck ROM: Full    Dental  (+) Edentulous Upper, Edentulous Lower, Dental Advisory Given   Pulmonary shortness of breath, COPD, Current Smoker,    breath sounds clear to auscultation       Cardiovascular + Cardiac Stents and + Peripheral Vascular Disease   Rhythm:Regular Rate:Normal     Neuro/Psych    GI/Hepatic   Endo/Other    Renal/GU      Musculoskeletal   Abdominal   Peds  Hematology   Anesthesia Other Findings   Reproductive/Obstetrics                           Anesthesia Physical Anesthesia Plan  ASA: III  Anesthesia Plan: General   Post-op Pain Management:    Induction: Intravenous  Airway Management Planned: Oral ETT  Additional Equipment: Arterial line, PA Cath and CVP  Intra-op Plan:   Post-operative Plan: Possible Post-op intubation/ventilation  Informed Consent: I have reviewed the patients History and Physical, chart, labs and discussed the procedure including the risks, benefits and alternatives for the proposed anesthesia with the patient or authorized representative who has indicated his/her understanding and acceptance.   Dental advisory given  Plan Discussed with: CRNA and Anesthesiologist  Anesthesia Plan Comments:        Anesthesia Quick Evaluation

## 2016-07-24 NOTE — Progress Notes (Signed)
eLink Physician-Brief Progress Note Patient Name: Chauncy Leanatricia A Spangler DOB: 03/30/1950 MRN: [id number]   Date of Service  07/24/2016  HPI/Events of Note  66 yo woman, s/p vascular bypass and AAA repair. Extubated in the PACU. Hemodynamically stable. No new orders for now.   eICU Interventions       Intervention Category Evaluation Type: New Patient Evaluation  Alvera Tourigny S. 07/24/2016, 4:24 PM

## 2016-07-24 NOTE — H&P (View-Only) (Signed)
Referring Physician: Dr Josiah Lobo  Patient name: Alexandra Mays MRN: [id number] DOB: 09/25/1950 Sex: female  HPI: Alexandra Mays is a 66 y.o. female seen several weeks ago for abdominal aortic aneurysm. The aneurysm was first noticed in February 2017. At that time was 4.7 cm in diameter. She was recently seen in the emergency room and had a CT scan performed for abdominal and back pain. The aneurysm was 5.1 cm in diameter on that exam. During that ER evaluation she was noted to have severe constipation and chronic back pain from L4-L5 compression. She returns today for further follow-up. She states that her pain is completely resolved. Other medical problems include COPD and elevated cholesterol. She also smokes half a pack of cigarettes per day and has a long smoking history in the past. Greater than 3 minutes they spent regarding smoking cessation counseling.  Past Medical History:  Diagnosis Date  . AAA (abdominal aortic aneurysm) (HCC)   . COPD (chronic obstructive pulmonary disease) (HCC)   . Pure hyperglyceridemia    Past Surgical History:  Procedure Laterality Date  . cardiac stenting  2007  . NECK SURGERY      Family History  Problem Relation Age of Onset  . Stroke Mother     SOCIAL HISTORY: Social History   Social History  . Marital status: Widowed    Spouse name: N/A  . Number of children: N/A  . Years of education: N/A   Occupational History  . Not on file.   Social History Main Topics  . Smoking status: Current Every Day Smoker    Packs/day: 1.00    Years: 50.00    Types: Cigarettes  . Smokeless tobacco: Never Used     Comment: A little less than 1 pk per day  . Alcohol use No  . Drug use: No  . Sexual activity: Not on file   Other Topics Concern  . Not on file   Social History Narrative  . No narrative on file    No Known Allergies  Current Outpatient Prescriptions  Medication Sig Dispense Refill  . albuterol (PROVENTIL HFA;VENTOLIN HFA) 108  (90 Base) MCG/ACT inhaler Inhale 2 puffs into the lungs every 6 (six) hours as needed for wheezing or shortness of breath.    Marland Kitchen aspirin EC 81 MG tablet Take 81 mg by mouth at bedtime.    . Fluticasone Furoate-Vilanterol (BREO ELLIPTA) 100-25 MCG/INH AEPB Inhale 1 puff into the lungs daily.     . polyethylene glycol (MIRALAX / GLYCOLAX) packet Take 17 g by mouth daily. 14 each 0  . simvastatin (ZOCOR) 20 MG tablet Take 20 mg by mouth daily.    . Tiotropium Bromide Monohydrate (SPIRIVA RESPIMAT) 2.5 MCG/ACT AERS Inhale 2 puffs into the lungs daily.     No current facility-administered medications for this visit.     ROS:   General:  No weight loss, Fever, chills  HEENT: No recent headaches, no nasal bleeding, no visual changes, no sore throat  Neurologic: No dizziness, blackouts, seizures. No recent symptoms of stroke or mini- stroke. No recent episodes of slurred speech, or temporary blindness.  Cardiac: No recent episodes of chest pain/pressure, no shortness of breath at rest.  + shortness of breath with exertion.  Denies history of atrial fibrillation or irregular heartbeat  Vascular: No history of rest pain in feet.  No history of claudication.  No history of non-healing ulcer, No history of DVT   Pulmonary: No home oxygen, + productive cough, no  hemoptysis,  + asthma or wheezing  Musculoskeletal:  [X]  Arthritis, [X]  Low back pain,  [X]  Joint pain  Hematologic:No history of hypercoagulable state.  No history of easy bleeding.  No history of anemia  Gastrointestinal: No hematochezia or melena,  No gastroesophageal reflux, no trouble swallowing  Urinary: [ ]  chronic Kidney disease, [ ]  on HD - [ ]  MWF or [ ]  TTHS, [ ]  Burning with urination, [ ]  Frequent urination, [ ]  Difficulty urinating;   Skin: No rashes  Psychological: No history of anxiety,  No history of depression   Physical Examination  Vitals:   06/29/16 0922  BP: 124/78  Pulse: 86  Resp: 16  Temp: 98.1 F (36.7  C)  TempSrc: Oral  SpO2: 96%  Weight: 92 lb (41.7 kg)  Height: 5\' 3"  (1.6 m)    Body mass index is 16.3 kg/m.  General:  Alert and oriented, no acute distress HEENT: Normal Neck: No bruit or JVD Pulmonary: Clear to auscultation bilaterally Cardiac: Regular Rate and Rhythm without murmur Abdomen: Soft, non-tender, non-distended, Pulsatile mass best appreciated just to the left of the umbilicus Skin: No rash Extremity Pulses:  2+ radial, brachial, femoral, absent left dorsalis pedis 2+ left posterior tibial pulse, 2+ right dorsalis pedis pulse absent left posterior tibial pulse Musculoskeletal: No deformity or edema  Neurologic: Upper and lower extremity motor 5/5 and symmetric  DATA:  I reviewed the recent CT [facility] of the chest abdomen and [facility] last week. This shows an infrarenal abdominal aortic aneurysm but there is neck tortuosity which I believe would make it difficult for stent graft repair. There is no iliac artery aneurysm component.  There are also 3 renal arteries on the right side.  ASSESSMENT:  Patient was slowly growing infrarenal abdominal aortic aneurysm. Not a candidate for stent graft repair. She will require open repair. I believe the aneurysm is large enough at this time that we should consider repair to avoid possible rupture. Risks benefits possible complications and procedure details including but not limited to bleeding risk of transfusion (20%), myocardial events 5%, infection 1%, death 1%, renal dysfunction 1%, coronary complications 5-10%. The patient understands and agrees to proceed.   PLAN:  We will schedule the patient for cardiology evaluation by Dr. Tomie Chinaevankar and then proceed with repair of her abdominal aortic aneurysm on 07/24/2016 if her cardiac risk is acceptable. I also discussed with her today stopping her cigarette smoking to try to reduce risk of pneumonia and pulmonary complications.   Fabienne Brunsharles Fields, MD [facility] [place] Office: [phone] Pager: [phone]

## 2016-07-24 NOTE — Anesthesia Procedure Notes (Addendum)
Procedure Name: Intubation Date/Time: 07/24/2016 7:45 AM Performed by: Rise PatienceBELL, Emmah Bratcher T Pre-anesthesia Checklist: Patient identified Patient Re-evaluated:Patient Re-evaluated prior to inductionOxygen Delivery Method: Circle system utilized Preoxygenation: Pre-oxygenation with 100% oxygen Intubation Type: IV induction Ventilation: Mask ventilation without difficulty Laryngoscope Size: Miller and 2 Grade View: Grade I Tube type: Subglottic suction tube Tube size: 7.5 mm Number of attempts: 1 Airway Equipment and Method: Stylet Placement Confirmation: ETT inserted through vocal cords under direct vision,  positive ETCO2 and breath sounds checked- equal and bilateral Secured at: 20 (lips) cm Tube secured with: Tape Dental Injury: Teeth and Oropharynx as per pre-operative assessment  Comments: Intubation by Larina BrasEmilee Smith, SRNA.

## 2016-07-25 ENCOUNTER — Encounter (HOSPITAL_COMMUNITY): Payer: Self-pay | Admitting: Vascular Surgery

## 2016-07-25 ENCOUNTER — Inpatient Hospital Stay (HOSPITAL_COMMUNITY): Payer: Medicare Other

## 2016-07-25 LAB — COMPREHENSIVE METABOLIC PANEL
ALBUMIN: 2.8 g/dL — AB (ref 3.5–5.0)
ALK PHOS: 32 U/L — AB (ref 38–126)
ALT: 16 U/L (ref 14–54)
AST: 51 U/L — AB (ref 15–41)
Anion gap: 4 — ABNORMAL LOW (ref 5–15)
BILIRUBIN TOTAL: 1.3 mg/dL — AB (ref 0.3–1.2)
BUN: 14 mg/dL (ref 6–20)
CALCIUM: 7.3 mg/dL — AB (ref 8.9–10.3)
CO2: 20 mmol/L — AB (ref 22–32)
CREATININE: 0.89 mg/dL (ref 0.44–1.00)
Chloride: 111 mmol/L (ref 101–111)
GFR calc Af Amer: >60 mL/min (ref >60)
Glucose, Bld: 159 mg/dL — ABNORMAL HIGH (ref 65–99)
Potassium: 3.7 mmol/L (ref 3.5–5.1)
Sodium: 135 mmol/L (ref 135–145)
Total Protein: 4.3 g/dL — ABNORMAL LOW (ref 6.5–8.1)

## 2016-07-25 LAB — POCT I-STAT 7, (LYTES, BLD GAS, ICA,H+H)
Acid-base deficit: 6 mmol/L — ABNORMAL HIGH (ref 0.0–2.0)
BICARBONATE: 21.3 mmol/L (ref 20.0–28.0)
Calcium, Ion: 1.07 mmol/L — ABNORMAL LOW (ref 1.15–1.40)
HEMATOCRIT: 30 % — AB (ref 36.0–46.0)
Hemoglobin: 10.2 g/dL — ABNORMAL LOW (ref 12.0–15.0)
O2 Saturation: 100 %
PCO2 ART: 46.4 mmHg (ref 32.0–48.0)
PO2 ART: 209 mmHg — AB (ref 83.0–108.0)
POTASSIUM: 4.7 mmol/L (ref 3.5–5.1)
Patient temperature: 36.5
Sodium: 141 mmol/L (ref 135–145)
TCO2: 23 mmol/L (ref 0–100)
pH, Arterial: 7.268 — ABNORMAL LOW (ref 7.350–7.450)

## 2016-07-25 LAB — CBC
HEMATOCRIT: 37.9 % (ref 36.0–46.0)
HEMOGLOBIN: 12.6 g/dL (ref 12.0–15.0)
MCH: 29.5 pg (ref 26.0–34.0)
MCHC: 33.2 g/dL (ref 30.0–36.0)
MCV: 88.8 fL (ref 78.0–100.0)
Platelets: 59 10*3/uL — ABNORMAL LOW (ref 150–400)
RBC: 4.27 MIL/uL (ref 3.87–5.11)
RDW: 16.9 % — AB (ref 11.5–15.5)
WBC: 12.3 10*3/uL — AB (ref 4.0–10.5)

## 2016-07-25 LAB — AMYLASE: Amylase: 27 U/L — ABNORMAL LOW (ref 28–100)

## 2016-07-25 LAB — BLOOD GAS, ARTERIAL
ACID-BASE DEFICIT: 3.6 mmol/L — AB (ref 0.0–2.0)
BICARBONATE: 20.7 mmol/L (ref 20.0–28.0)
FIO2: 0.28
O2 Saturation: 93.3 %
PH ART: 7.379 (ref 7.350–7.450)
Patient temperature: 98.6
pCO2 arterial: 35.9 mmHg (ref 32.0–48.0)
pO2, Arterial: 67.8 mmHg — ABNORMAL LOW (ref 83.0–108.0)

## 2016-07-25 LAB — MAGNESIUM: MAGNESIUM: 1.5 mg/dL — AB (ref 1.7–2.4)

## 2016-07-25 IMAGING — CR DG CHEST 1V PORT
1 series · 1 of 1 positions shown · non-contrast
Comparison: [DATE]

CLINICAL DATA: Status post abdominal aortic aneurysm repair

EXAM:
PORTABLE CHEST 1 VIEW

[AP]
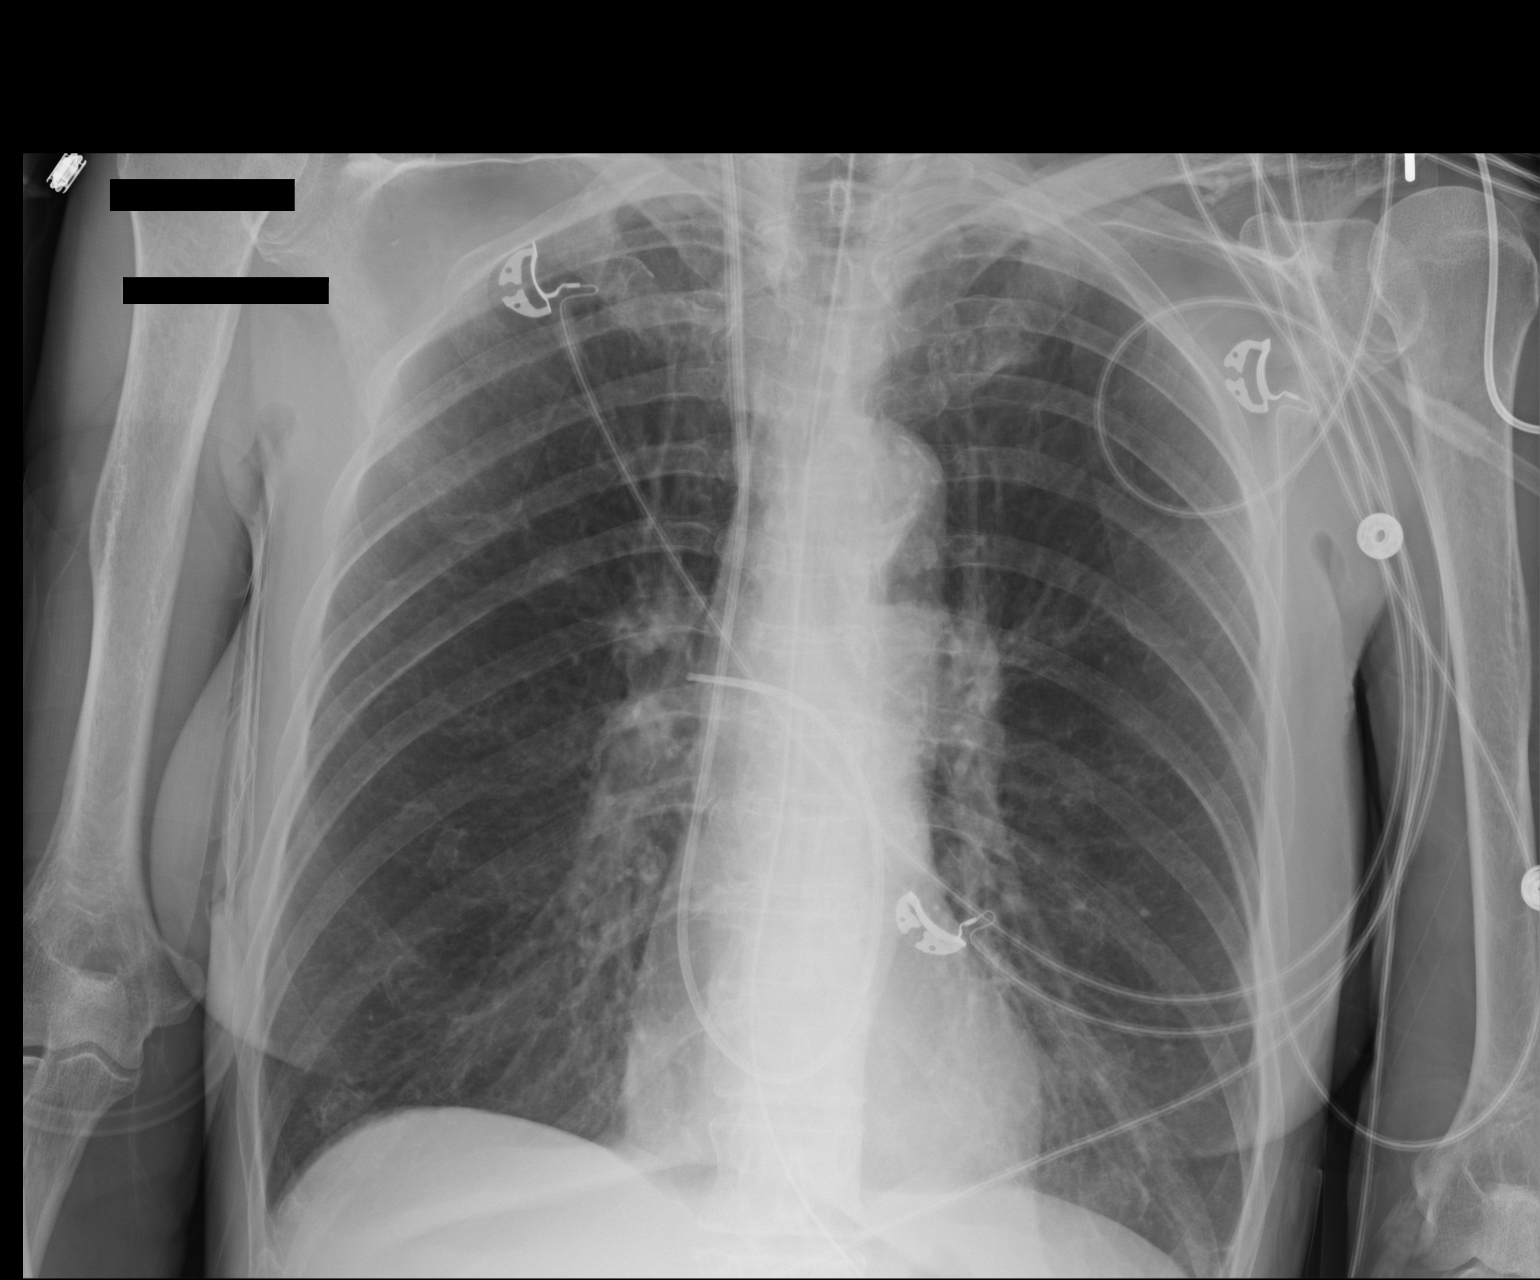

[1 of 1 positions shown; findings below may reference images not displayed]

FINDINGS: Cardiac shadow is within normal limits. Lungs are well aerated
bilaterally. No infiltrate or sizable effusion is seen. Swan-Ganz
catheter and nasogastric catheter are noted in satisfactory
position. No bony abnormality is noted.
IMPRESSION: No acute abnormality noted.

## 2016-07-25 MED ORDER — NALOXONE HCL 0.4 MG/ML IJ SOLN: 0.4000 mg | INTRAMUSCULAR | Status: DC | PRN

## 2016-07-25 MED ORDER — DIPHENHYDRAMINE HCL 50 MG/ML IJ SOLN
12.5000 mg | Freq: Four times a day (QID) | INTRAMUSCULAR | Status: DC | PRN
  Administered 2016-07-25: 12.5 mg via INTRAVENOUS
  Filled 2016-07-25: qty 1

## 2016-07-25 MED ORDER — HYDROMORPHONE 1 MG/ML IV SOLN
INTRAVENOUS | Status: DC
  Administered 2016-07-25: 09:00:00 via INTRAVENOUS
  Administered 2016-07-25: 2 mg via INTRAVENOUS
  Administered 2016-07-26: 1.2 mg via INTRAVENOUS
  Administered 2016-07-26: 1.8 mg via INTRAVENOUS
  Administered 2016-07-26: 0.8 mg via INTRAVENOUS
  Administered 2016-07-26: 1 mg via INTRAVENOUS
  Administered 2016-07-26: 1.4 mg via INTRAVENOUS
  Administered 2016-07-27: 0.2 mg via INTRAVENOUS
  Administered 2016-07-27: 4 mg via INTRAVENOUS
  Administered 2016-07-27: 1.8 mg via INTRAVENOUS
  Administered 2016-07-27: 2 mg via INTRAVENOUS
  Administered 2016-07-27 – 2016-07-28 (×2): 25 mg via INTRAVENOUS
  Administered 2016-07-28: 1.4 mg via INTRAVENOUS
  Administered 2016-07-28: 3.2 mg via INTRAVENOUS
  Administered 2016-07-28: 2.6 mg via INTRAVENOUS
  Administered 2016-07-28 – 2016-07-29 (×2): 1.2 mg via INTRAVENOUS
  Administered 2016-07-29: 1 mg via INTRAVENOUS
  Administered 2016-07-29: 0.8 mg via INTRAVENOUS
  Filled 2016-07-25 (×4): qty 25

## 2016-07-25 MED ORDER — SODIUM CHLORIDE 0.9% FLUSH: 9.0000 mL | INTRAVENOUS | Status: DC | PRN

## 2016-07-25 MED ORDER — DIPHENHYDRAMINE HCL 12.5 MG/5ML PO ELIX
12.5000 mg | ORAL_SOLUTION | Freq: Four times a day (QID) | ORAL | Status: DC | PRN
Start: 2016-07-25 — End: 2016-07-29

## 2016-07-25 MED ORDER — POTASSIUM CHLORIDE 10 MEQ/50ML IV SOLN
10.0000 meq | INTRAVENOUS | Status: AC
  Administered 2016-07-25 (×2): 10 meq via INTRAVENOUS
  Filled 2016-07-25 (×2): qty 50

## 2016-07-25 MED ORDER — ONDANSETRON HCL 4 MG/2ML IJ SOLN
4.0000 mg | Freq: Four times a day (QID) | INTRAMUSCULAR | Status: DC | PRN
  Administered 2016-07-25 – 2016-07-27 (×3): 4 mg via INTRAVENOUS
  Filled 2016-07-25 (×3): qty 2

## 2016-07-25 NOTE — Evaluation (Signed)
Occupational Therapy Evaluation Patient Details Name: Alexandra Mays MRN: [id number] DOB: 01/28/1951 Today's Date: 07/25/2016    History of Present Illness Pt is a 66 yo female admitted for a aorto R femoral left common iliac bypass for AAA.  Pt underwent L common iliac endartarectomy and R common femoral endartarectomy. Pt with h/o low back pain, neck surgery and COPD.     Clinical Impression   Pt admitted with the above diagnosis and has the deficits listed below. Pt would benefit from cont OT to increase independence with basic adls so she can d/c back home with her granddaughter.  Granddaughter works full time but son is around the home as he works on their dairy farm.  Spoke to daughter about having someone with her the first few days she is home.  Will see how pt progresses with pain control.    Follow Up Recommendations  Home health OT;Supervision/Assistance - 24 hour (24/7 assist possibly for first few days if pt progress well.)    Equipment Recommendations  Tub/shower seat    Recommendations for Other Services       Precautions / Restrictions Precautions Precautions: Fall Restrictions Weight Bearing Restrictions: No Other Position/Activity Restrictions: watch lines      Mobility Bed Mobility Overal bed mobility: Needs Assistance Bed Mobility: Supine to Sit;Sit to Supine     Supine to sit: Mod assist;HOB elevated Sit to supine: Mod assist;HOB elevated   General bed mobility comments: Pt moved legs into and out of bed but is so sore in her abdomen needed more assist getting into full sitting.  Transfers                 General transfer comment: pt sat EOB but deferred standing due to pain.    Balance Overall balance assessment: Needs assistance Sitting-balance support: Feet supported Sitting balance-Leahy Scale: Fair                                     ADL either performed or assessed with clinical judgement   ADL Overall ADL's :  Needs assistance/impaired Eating/Feeding: NPO   Grooming: Wash/dry hands;Wash/dry face;Set up;Sitting   Upper Body Bathing: Minimal assistance;Sitting   Lower Body Bathing: Maximal assistance;Bed level Lower Body Bathing Details (indicate cue type and reason): most limited by pain.  Pt moving well.  Once pain under control pt will not need assist. Upper Body Dressing : Moderate assistance;Sitting   Lower Body Dressing: Maximal assistance;Bed level Lower Body Dressing Details (indicate cue type and reason): Pt came to sit on EOB but did not stand as O2 sats dropping when sitting.             Functional mobility during ADLs: Minimal assistance General ADL Comments: Pt most limited by pain and lines but should do very well when pain under control.     Vision Baseline Vision/History: No visual deficits Patient Visual Report: No change from baseline Vision Assessment?: No apparent visual deficits     Perception     Praxis      Pertinent Vitals/Pain Pain Assessment: 0-10 Pain Score: 10-Worst pain ever Pain Location: abdomen and R leg Pain Descriptors / Indicators: Aching;Burning;Grimacing;Moaning;Pressure;Spasm Pain Intervention(s): Limited activity within patient's tolerance;Monitored during session;Repositioned;PCA encouraged;Relaxation     Hand Dominance Right   Extremity/Trunk Assessment Upper Extremity Assessment Upper Extremity Assessment: Overall WFL for tasks assessed   Lower Extremity Assessment Lower Extremity Assessment: Defer to  PT evaluation   Cervical / Trunk Assessment Cervical / Trunk Assessment: Other exceptions Cervical / Trunk Exceptions: past neck surgery and chronic low back pain.   Communication Communication Communication: No difficulties   Cognition Arousal/Alertness: Awake/alert Behavior During Therapy: WFL for tasks assessed/performed Overall Cognitive Status: Within Functional Limits for tasks assessed                                  General Comments: Pt c/o a lot of pain but was very willing to work through it if you told her what was coming next with movement.   General Comments  Pt moved well in all extremities; just very sore in abdominal area.  Informing pt of what came next with mobility and allowing her to move herself assisted with pain control.    Exercises     Shoulder Instructions      Home Living Family/patient expects to be discharged to:: Private residence Living Arrangements: Other relatives Available Help at Discharge: Family;Available PRN/intermittently;Other (Comment) (may be able to get 24/7 assist for 5-7 days) Type of Home: House Home Access: Stairs to enter Entrance Stairs-Number of Steps: 3   Home Layout: Able to live on main level with bedroom/bathroom;Two level     Bathroom Shower/Tub: Tub/shower unit;Curtain;Other (comment) (usually sits down in the tub)   Bathroom Toilet: Standard     Home Equipment: None   Additional Comments: may need shower chair      Prior Functioning/Environment Level of Independence: Independent        Comments: Pt drives        OT Problem List: Decreased strength;Decreased activity tolerance;Impaired balance (sitting and/or standing);Decreased knowledge of use of DME or AE;Pain      OT Treatment/Interventions: Self-care/ADL training;Therapeutic activities;DME and/or AE instruction    OT Goals(Current goals can be found in the care plan section) Acute Rehab OT Goals Patient Stated Goal: to get rid of the pain OT Goal Formulation: With patient Time For Goal Achievement: 08/08/16 Potential to Achieve Goals: Good ADL Goals Pt Will Perform Grooming: with supervision;standing Pt Will Perform Lower Body Bathing: with supervision;sit to/from stand Pt Will Perform Lower Body Dressing: with supervision;sit to/from stand Pt Will Perform Tub/Shower Transfer: Tub transfer;shower seat;ambulating;with min guard assist Additional ADL Goal #1:  Pt will walk to bathroom with assistive device if needed and do all toileting tasks on comfort commode with S.  OT Frequency: Min 3X/week   Barriers to D/C: Decreased caregiver support  Pt lives with granddaughter who works.  Son around on dairy farm during day but not with her.  Family thinks they may be able to get someone to stay with her for a few days after d/c       Co-evaluation              AM-PAC PT "6 Clicks" Daily Activity     Outcome Measure Help from another person eating meals?: Total Help from another person taking care of personal grooming?: A Little Help from another person toileting, which includes using toliet, bedpan, or urinal?: A Lot Help from another person bathing (including washing, rinsing, drying)?: A Lot Help from another person to put on and taking off regular upper body clothing?: A Lot Help from another person to put on and taking off regular lower body clothing?: A Lot 6 Click Score: 12   End of Session Equipment Utilized During Treatment: Oxygen Nurse Communication: Mobility status  Activity Tolerance: Patient limited by pain Patient left: in bed;with call bell/phone within reach;with family/visitor present  OT Visit Diagnosis: Unsteadiness on feet (R26.81);Muscle weakness (generalized) (M62.81);Pain Pain - part of body: Leg (abdomen)                Time: 4098-1191 OT Time Calculation (min): 31 min Charges:  OT General Charges $OT Visit: 1 Procedure OT Evaluation $OT Eval Moderate Complexity: 1 Procedure OT Treatments $Self Care/Home Management : 8-22 mins G-Codes:     Tory Emerald, OTR/L [phone]  Hope Budds 07/25/2016, 11:45 AM

## 2016-07-25 NOTE — Evaluation (Signed)
Physical Therapy Evaluation Patient Details Name: Alexandra Mays MRN: [id number] DOB: 05/16/1950 Today's Date: 07/25/2016   History of Present Illness  Pt is a 66 yo female admitted for a aorto R femoral left common iliac bypass for AAA.  Pt underwent L common iliac endartarectomy and R common femoral endartarectomy. Pt with h/o low back pain, neck surgery and COPD.    Clinical Impression  Patient presents with mobility chiefly limited by pain and weakness.  Feel she will need skilled PT in the acute setting to address weakness, pain, decreased balance and poor activity tolerance and likely SNF level rehab prior to d/c home.  Currently needs mod to max A for mobility and previously was independent.     Follow Up Recommendations SNF;Supervision/Assistance - 24 hour    Equipment Recommendations  Rolling walker with 5" wheels    Recommendations for Other Services       Precautions / Restrictions Precautions Precautions: Fall      Mobility  Bed Mobility Overal bed mobility: Needs Assistance Bed Mobility: Rolling;Sidelying to Sit Rolling: Mod assist;+2 for safety/equipment Sidelying to sit: Max assist       General bed mobility comments: assist to bring hips over and cues for using rail, then assist for legs off bed and to lift trunk upright (limited by pain)  Transfers Overall transfer level: Needs assistance Equipment used: None Transfers: Stand Pivot Transfers   Stand pivot transfers: Mod assist;+2 safety/equipment       General transfer comment: lifting and pivoting help to recliner, +2 for lines  Ambulation/Gait             General Gait Details: NT due to pain, HR 140's and wheezing, (RN in to deliver nebulizer tx)  Information systems managertairs            Wheelchair Mobility    Modified Rankin (Stroke Patients Only)       Balance Overall balance assessment: Needs assistance Sitting-balance support: Feet supported Sitting balance-Leahy Scale: Poor Sitting balance -  Comments: trying to use hands for balance, but needing to hug pillow to abdomen due to pain, min A given   Standing balance support: Bilateral upper extremity supported Standing balance-Leahy Scale: Poor Standing balance comment: UE support during OOB transfer                             Pertinent Vitals/Pain Pain Score: 10-Worst pain ever Pain Location: abdomen and R leg Pain Descriptors / Indicators: Aching;Burning;Grimacing;Moaning;Pressure;Spasm Pain Intervention(s): Limited activity within patient's tolerance;Monitored during session;Repositioned;PCA encouraged    Home Living Family/patient expects to be discharged to:: Skilled nursing facility Living Arrangements: Other relatives Available Help at Discharge: Family;Available PRN/intermittently;Other (Comment) Type of Home: House Home Access: Stairs to enter   Entrance Stairs-Number of Steps: 3 Home Layout: Able to live on main level with bedroom/bathroom;Two level Home Equipment: None Additional Comments: may need shower chair    Prior Function Level of Independence: Independent         Comments: Pt drives     Hand Dominance   Dominant Hand: Right    Extremity/Trunk Assessment   Upper Extremity Assessment Upper Extremity Assessment: Defer to OT evaluation    Lower Extremity Assessment Lower Extremity Assessment: RLE deficits/detail;LLE deficits/detail RLE Deficits / Details: AAROM WFL, but painful in groins, strength knee extension 4+/5, hip NT due to pain RLE Sensation: history of peripheral neuropathy LLE Deficits / Details: AAROM WFL, but painful in groins, strength knee extension  4+/5, hip NT due to pain LLE Sensation: history of peripheral neuropathy    Cervical / Trunk Assessment Cervical / Trunk Assessment: Other exceptions Cervical / Trunk Exceptions: past neck surgery and chronic low back pain.  Communication   Communication: No difficulties  Cognition Arousal/Alertness:  Awake/alert Behavior During Therapy: WFL for tasks assessed/performed Overall Cognitive Status: Within Functional Limits for tasks assessed                                        General Comments      Exercises     Assessment/Plan    PT Assessment Patient needs continued PT services  PT Problem List Decreased strength;Decreased mobility;Decreased activity tolerance;Decreased balance;Decreased knowledge of use of DME;Pain;Cardiopulmonary status limiting activity       PT Treatment Interventions DME instruction;Gait training;Therapeutic activities;Patient/family education;Therapeutic exercise;Balance training;Functional mobility training    PT Goals (Current goals can be found in the Care Plan section)  Acute Rehab PT Goals Patient Stated Goal: to get rid of the pain PT Goal Formulation: With patient Time For Goal Achievement: 08/01/16 Potential to Achieve Goals: Good    Frequency Min 3X/week   Barriers to discharge        Co-evaluation               AM-PAC PT "6 Clicks" Daily Activity  Outcome Measure Difficulty turning over in bed (including adjusting bedclothes, sheets and blankets)?: Total Difficulty moving from lying on back to sitting on the side of the bed? : Total Difficulty sitting down on and standing up from a chair with arms (e.g., wheelchair, bedside commode, etc,.)?: Total Help needed moving to and from a bed to chair (including a wheelchair)?: A Lot Help needed walking in hospital room?: Total Help needed climbing 3-5 steps with a railing? : Total 6 Click Score: 7    End of Session Equipment Utilized During Treatment: Oxygen Activity Tolerance: Patient limited by pain Patient left: in chair;with call bell/phone within reach;with chair alarm set Nurse Communication: Mobility status PT Visit Diagnosis: Pain;Muscle weakness (generalized) (M62.81);Difficulty in walking, not elsewhere classified (R26.2) Pain - Right/Left: Right Pain -  part of body: Leg    Time: 1432-1500 PT Time Calculation (min) (ACUTE ONLY): 28 min   Charges:   PT Evaluation $PT Eval Moderate Complexity: 1 Procedure PT Treatments $Therapeutic Activity: 8-22 mins   PT G CodesSheran Lawless, [place] [phone] 07/25/2016   Elray Mcgregor 07/25/2016, 5:33 PM

## 2016-07-25 NOTE — Plan of Care (Signed)
Problem: Activity: Goal: Ability to tolerate increased activity will improve Outcome: Progressing Pt up oob x2  Problem: Bowel/Gastric: Goal: Gastrointestinal status for postoperative course will improve Outcome: Progressing Passing flatus per pt  Problem: Cardiac: Goal: Ability to maintain an adequate cardiac output will improve Outcome: Progressing Within parameters

## 2016-07-25 NOTE — Progress Notes (Addendum)
[facility]  VASCULAR SURGERY ASSESSMENT & PLAN:   CARDIAC: Hemodynamically stable. D/C Swan.  RENAL: Good U/O, crt normal  GI/ NURT: NGT 200 cc overnight. 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 Send HIT. Hold heparin.   Waverly Ferrarihristopher Mcgwire Dasaro, MD, FACS Beeper [phone] Office: [phone]   Subjective  - resting now, has had issues with pain all night.  Episode of nausea treated with Zofran and resolved.  Hypertension managed with one dose of hydralazine and pain medication.  Objective (!) 109/53 82 98.4 F (36.9 C) 10 95%  Intake/Output Summary (Last 24 hours) at 07/25/16 0719 Last data filed at 07/25/16 0711  Gross per 24 hour  Intake          7831.25 ml  Output             2495 ml  Net          5336.25 ml    Palpable pulses DP B Incisions healing well with out right groin hematoma No BS, abdomin flat Heat RRR Lungs non labored breathing NG tube out put 200 cc over night   Assessment/Planning: POD # 1 Procedure: Aorto right femoral left common iliac artery bypass for abdominal aortic aneurysm, left common iliac endarterectomy, right common femoral endarterectomy  Maintain NG tube Reduced dose PCA for pain control OOB as tolerates today NPO HGB stable after cell saver and 3 units of PRBC  COLLINS, EMMA [name] 07/25/2016 7:19 AM --  Laboratory Lab Results:  Recent Labs  07/24/16 1306 07/25/16 0350  WBC 11.4* 12.3*  HGB 13.1 12.6  HCT 38.7 37.9  PLT 88* 59*   BMET  Recent Labs  07/24/16 1306 07/25/16 0350  NA 137 135  K 4.4 3.7  CL 113* 111  CO2 19* 20*  GLUCOSE 137* 159*  BUN 10 14  CREATININE 0.81 0.89  CALCIUM 7.3* 7.3*    COAG Lab Results  Component Value Date   INR 1.48 07/24/2016   INR 1.06 07/21/2016   No results found for: PTT

## 2016-07-25 NOTE — Care Management Note (Addendum)
Case Management Note [name] Triniti Gruetzmacher RN, BSN Unit 2W-Case Manager-- 2H coverage [phone]  Patient Details  Name: Alexandra Mays MRN: [id number] Date of Birth: 11/01/1950  Subjective/Objective:   Pt admitted s/p Aorto right femoral left common iliac artery bypass for abdominal aortic aneurysm, left common iliac endarterectomy, right common femoral endarterectomy- on 07/24/16                 Action/Plan: PTA pt lived at home- independent- CM to follow for d/c needs.  Received notice from Tiffany with [facility]- they have a  Pre-op referral for any Home Health needs at discharge- [facility]   Expected Discharge Date:                  Expected Discharge Plan:     In-House Referral:     Discharge planning Services  CM Consult  Post Acute Care Choice:    Choice offered to:     DME Arranged:    DME Agency:     HH Arranged:    HH Agency:     Status of Service:  In process, will continue to follow  If discussed at Long Length of Stay Meetings, dates discussed:    Discharge Disposition:   Additional Comments:  07/25/16- 1020- Chanese Hartsough RN CM- pt post op day 1- to keep NGT for today and PCA- remains NPO for now- CM to follow as pt progresses for d/c needs  [name], Dorothy Landgrebe Hall, RN 07/25/2016, 10:23 AM

## 2016-07-26 LAB — TYPE AND SCREEN
ABO/RH(D): A NEG
ANTIBODY SCREEN: NEGATIVE
UNIT DIVISION: 0
UNIT DIVISION: 0
Unit division: 0
Unit division: 0
Unit division: 0
Unit division: 0

## 2016-07-26 LAB — BPAM RBC
BLOOD PRODUCT EXPIRATION DATE: 201805222359
BLOOD PRODUCT EXPIRATION DATE: 201805232359
BLOOD PRODUCT EXPIRATION DATE: 201806072359
Blood Product Expiration Date: 201805232359
Blood Product Expiration Date: 201806012359
Blood Product Expiration Date: 201806052359
ISSUE DATE / TIME: 201805140733
ISSUE DATE / TIME: 201805140733
ISSUE DATE / TIME: 201805140733
ISSUE DATE / TIME: 201805140733
UNIT TYPE AND RH: 600
UNIT TYPE AND RH: 600
UNIT TYPE AND RH: 600
Unit Type and Rh: 600
Unit Type and Rh: 600
Unit Type and Rh: 600

## 2016-07-26 LAB — CBC
HCT: 33.7 % — ABNORMAL LOW (ref 36.0–46.0)
Hemoglobin: 11.4 g/dL — ABNORMAL LOW (ref 12.0–15.0)
MCH: 30.7 pg (ref 26.0–34.0)
MCHC: 33.8 g/dL (ref 30.0–36.0)
MCV: 90.8 fL (ref 78.0–100.0)
PLATELETS: 36 10*3/uL — AB (ref 150–400)
RBC: 3.71 MIL/uL — ABNORMAL LOW (ref 3.87–5.11)
RDW: 16.7 % — AB (ref 11.5–15.5)
WBC: 11.4 10*3/uL — AB (ref 4.0–10.5)

## 2016-07-26 LAB — HEPARIN INDUCED PLATELET AB (HIT ANTIBODY): HEPARIN INDUCED PLT AB: 0.122 {OD_unit} (ref 0.000–0.400)

## 2016-07-26 MED ORDER — DIAZEPAM 5 MG/ML IJ SOLN
2.5000 mg | Freq: Two times a day (BID) | INTRAMUSCULAR | Status: DC | PRN
  Administered 2016-07-26: 2.5 mg via INTRAVENOUS
  Filled 2016-07-26 (×2): qty 2

## 2016-07-26 MED ORDER — OXYCODONE-ACETAMINOPHEN 5-325 MG PO TABS
1.0000 | ORAL_TABLET | ORAL | Status: DC | PRN
  Administered 2016-07-26: 2 via ORAL
  Administered 2016-07-28: 1 via ORAL
  Administered 2016-07-28: 2 via ORAL
  Administered 2016-07-29: 1 via ORAL
  Administered 2016-07-29: 2 via ORAL
  Administered 2016-07-30 (×3): 1 via ORAL
  Administered 2016-07-30: 2 via ORAL
  Administered 2016-07-31 (×3): 1 via ORAL
  Administered 2016-07-31: 2 via ORAL
  Filled 2016-07-26 (×2): qty 2
  Filled 2016-07-26 (×2): qty 1
  Filled 2016-07-26: qty 2
  Filled 2016-07-26: qty 1
  Filled 2016-07-26: qty 2
  Filled 2016-07-26: qty 1
  Filled 2016-07-26: qty 2
  Filled 2016-07-26: qty 1
  Filled 2016-07-26 (×3): qty 2

## 2016-07-26 MED ORDER — DIAZEPAM 5 MG/ML IJ SOLN: 2.5000 mg | Freq: Four times a day (QID) | INTRAMUSCULAR | Status: DC | PRN

## 2016-07-26 NOTE — Progress Notes (Deleted)
[facility]   Subjective  - Sitting up in chair.  Pain is still the biggest issue.   Objective (!) 132/59 86 98.3 F (36.8 C) (Oral) 12 96%  Intake/Output Summary (Last 24 hours) at 07/26/16 0722 Last data filed at 07/26/16 0600  Gross per 24 hour  Intake             2975 ml  Output             1015 ml  Net             1960 ml    Abdomin firm, but not distended.  No BS. Palpable DP 2+ B Incisions all healing well. Heart RRR Lungs non labored breathing  Assessment/Planning: POD # 1 Procedure: Aorto right femoral left common iliac artery bypass for abdominal aortic aneurysm, left common iliac endarterectomy, right common femoral endarterectomy   NG tube discontinued yesterday per Dr. Edilia Boickson Reduced dose PCA for pain control OOB as tolerates today NPO HGB stable after cell saver and 3 units of PRBC D/C foley PO pain medication and   Clinton GallantCOLLINS, Alexandra Mays [facility] 07/26/2016 7:22 AM --  Laboratory Lab Results:  Recent Labs  07/25/16 0350 07/26/16 0340  WBC 12.3* 11.4*  HGB 12.6 11.4*  HCT 37.9 33.7*  PLT 59* 36*   BMET  Recent Labs  07/24/16 1306 07/25/16 0350  NA 137 135  K 4.4 3.7  CL 113* 111  CO2 19* 20*  GLUCOSE 137* 159*  BUN 10 14  CREATININE 0.81 0.89  CALCIUM 7.3* 7.3*    COAG Lab Results  Component Value Date   INR 1.48 07/24/2016   INR 1.06 07/21/2016   No results found for: PTT

## 2016-07-26 NOTE — Progress Notes (Addendum)
       Subjective  - Sitting up in chair.  Pain is still the biggest issue.   Objective (!) 132/59 86 98.3 F (36.8 C) (Oral) 12 96%  Intake/Output Summary (Last 24 hours) at 07/26/16 0722 Last data filed at 07/26/16 0600  Gross per 24 hour  Intake             2975 ml  Output             1015 ml  Net             1960 ml    Abdomin firm, but not distended.  No BS. Palpable DP 2+ B Incisions all healing well. Heart RRR Lungs non labored breathing  Assessment/Planning: POD # 1 Procedure: Aorto right femoral left common iliac artery bypass for abdominal aortic aneurysm, left common iliac endarterectomy, right common femoral endarterectomy   NG tube discontinued yesterday per Dr. Edilia Boickson Reduced dose PCA for pain control OOB as tolerates today NPO HGB stable after cell saver and 3 units of PRBC D/C foley PO pain medication and valium for anxiety IV q 6 PRN WBC elevated likely atelectasis encourage IS. UO stable Possible to transfer today stepdown verses 2W  Alexandra EdisCOLLINS, Alexandra Mays 07/26/2016 7:22 AM -- 2+ DP pulses Incisions healing Still no gut function Ambulate Transfer 2w PCA for pain now Dc foley  Fabienne Brunsharles Freeland Pracht, MD [facility] [place] Office: [phone] Pager: [phone]

## 2016-07-26 NOTE — Clinical Social Work Note (Signed)
Clinical Social Work Assessment  Patient Details  Name: Alexandra Mays MRN: [id number] Date of Birth: 06/10/1950  Date of referral:  07/26/16               Reason for consult:  Facility Placement                Permission sought to share information with:  Facility Industrial/product designerContact Representative Permission granted to share information::  Yes, Verbal Permission Granted  Name::     Systems analystKeri  Agency::  SNF  Relationship::     Contact Information:     Housing/Transportation Living arrangements for the past 2 months:  Single Family Home Source of Information:  Patient Patient Interpreter Needed:  None Criminal Activity/Legal Involvement Pertinent to Current Situation/Hospitalization:  No - Comment as needed Significant Relationships:  Other Family Members Lives with:  Other (Comment) (granddaughter) Do you feel safe going back to the place where you live?  No Need for family participation in patient care:  Yes (Comment)  Care giving concerns:  Pt lives at home with granddaughter and granddaughters spouse- both of whom work during the day.  Patient normally independent but needing max assist at this time- would not have physical support 24/7.   Social Worker assessment / plan:  CSW spoke with pt and pt grandson about PT recommendation for SNF.  Explained SNF and SNF referral process.  Employment status:  Retired Health and safety inspectornsurance information:  Medicare PT Recommendations:  Skilled Nursing Facility Information / Referral to community resources:  Skilled Nursing Facility  Patient/Family's Response to care:  Pt is agreeable to short term rehab stay.   Patient/Family's Understanding of and Emotional Response to Diagnosis, Current Treatment, and Prognosis:  Pt has good understanding of her current condition and limitations- hopeful that she will only need short stay at rehab and be able to return home.  Emotional Assessment Appearance:  Appears stated age Attitude/Demeanor/Rapport:    Affect (typically  observed):  Appropriate Orientation:  Oriented to Self, Oriented to Place, Oriented to  Time, Oriented to Situation Alcohol / Substance use:  Not Applicable Psych involvement (Current and /or in the community):  No (Comment)  Discharge Needs  Concerns to be addressed:  Care Coordination Readmission within the last 30 days:  No Current discharge risk:  Physical Impairment Barriers to Discharge:  Continued Medical Work up   Burna SisUris, Kym Scannell H, LCSW 07/26/2016, 1:03 PM

## 2016-07-26 NOTE — [place] FL2 (Signed)
[place] MEDICAID FL2 LEVEL OF CARE SCREENING TOOL     IDENTIFICATION  Patient Name: Alexandra Mays Birthdate: 02/08/1951 Sex: female Admission Date (Current Location): 07/24/2016  [facility] [place]  [employer]   Facility and Address:  [facility] [place], [address], [place], [place] [address]      Provider Number: [id number]  Attending Physician Name and Address:  Sherren KernsFields, Charles E, MD  Relative Name and Phone Number:       Current Level of Care: Hospital Recommended Level of Care: Skilled Nursing Facility Prior Approval Number:    Date Approved/Denied:   PASRR Number: [id number]  Discharge Plan: SNF    Current Diagnoses: Patient Active Problem List   Diagnosis Date Noted  . S/P aortic aneurysm repair 07/24/2016  . AAA (abdominal aortic aneurysm) (HCC) 07/24/2016  . AAA (abdominal aortic aneurysm) without rupture (HCC) 04/21/2015  . Mild cognitive impairment with memory loss 02/17/2015  . Depressive disorder 02/17/2015    Orientation RESPIRATION BLADDER Height & Weight     Self, Time, Situation, Place  O2 (4L [place]) Incontinent, Indwelling catheter Weight: 104 lb 8 oz (47.4 kg) Height:  5\' 3"  (160 cm)  BEHAVIORAL SYMPTOMS/MOOD NEUROLOGICAL BOWEL NUTRITION STATUS      Continent Diet (see DC summary)  AMBULATORY STATUS COMMUNICATION OF NEEDS Skin   Extensive Assist Verbally Surgical wounds                       Personal Care Assistance Level of Assistance  Bathing, Dressing Bathing Assistance: Maximum assistance   Dressing Assistance: Maximum assistance     Functional Limitations Info             SPECIAL CARE FACTORS FREQUENCY  PT (By licensed PT), OT (By licensed OT)     PT Frequency: 5/wk OT Frequency: 5/wk            Contractures      Additional Factors Info  Code Status, Allergies Code Status Info: FULL Allergies Info: NKA           Current Medications (07/26/2016):  This is the current  hospital active medication list Current Facility-Administered Medications  Medication Dose Route Frequency Provider Last Rate Last Dose  . 0.9 %  sodium chloride infusion  500 mL Intravenous Once PRN Clinton Gallantollins, Emma M, PA-C      . acetaminophen (TYLENOL) tablet 325-650 mg  325-650 mg Oral Q4H PRN Clinton Gallantollins, Emma M, PA-C       Or  . acetaminophen (TYLENOL) suppository 325-650 mg  325-650 mg Rectal Q4H PRN Lars Mageollins, Emma M, PA-C      . albuterol (PROVENTIL) (2.5 MG/3ML) 0.083% nebulizer solution 3 mL  3 mL Inhalation Q6H PRN Lars Mageollins, Emma M, PA-C   3 mL at 07/26/16 0540  . alum & mag hydroxide-simeth (MAALOX/MYLANTA) 200-200-20 MG/5ML suspension 15-30 mL  15-30 mL Oral Q2H PRN Clinton Gallantollins, Emma M, PA-C      . bisacodyl (DULCOLAX) EC tablet 5 mg  5 mg Oral Daily PRN Lars Mageollins, Emma M, PA-C      . Chlorhexidine Gluconate Cloth 2 % PADS 6 each  6 each Topical Daily Sherren KernsFields, Charles E, MD   6 each at 07/26/16 1000  . dextrose 5 % and 0.45 % NaCl with KCl 20 mEq/L infusion   Intravenous Continuous Lars MageCollins, Emma M, PA-C 125 mL/hr at 07/26/16 0550    . diazepam (VALIUM) injection 2.5 mg  2.5 mg Intravenous Q12H PRN Sherren KernsFields, Charles E,  MD      . diphenhydrAMINE (BENADRYL) injection 12.5 mg  12.5 mg Intravenous Q6H PRN Clinton Gallant M, PA-C   12.5 mg at 07/25/16 2114   Or  . diphenhydrAMINE (BENADRYL) 12.5 MG/5ML elixir 12.5 mg  12.5 mg Oral Q6H PRN Clinton Gallant M, PA-C      . docusate sodium (COLACE) capsule 100 mg  100 mg Oral Daily Clinton Gallant M, PA-C   100 mg at 07/26/16 1024  . fluticasone furoate-vilanterol (BREO ELLIPTA) 100-25 MCG/INH 1 puff  1 puff Inhalation Daily Lars Mage, PA-C   1 puff at 07/26/16 1610  . guaiFENesin-dextromethorphan (ROBITUSSIN DM) 100-10 MG/5ML syrup 15 mL  15 mL Oral Q4H PRN Clinton Gallant M, PA-C      . hydrALAZINE (APRESOLINE) injection 5 mg  5 mg Intravenous Q20 Min PRN Clinton Gallant M, PA-C   5 mg at 07/24/16 1700  . HYDROmorphone (DILAUDID) 1 mg/mL PCA injection    Intravenous Q4H Collins, Emma M, PA-C      . HYDROmorphone (DILAUDID) injection 0.5-1 mg  0.5-1 mg Intravenous Q2H PRN Lars Mage, PA-C   1 mg at 07/25/16 9604  . labetalol (NORMODYNE,TRANDATE) injection 10 mg  10 mg Intravenous Q10 min PRN Lars Mage, PA-C   5 mg at 07/24/16 1507  . MEDLINE mouth rinse  15 mL Mouth Rinse BID Sherren Kerns, MD   15 mL at 07/26/16 1000  . metoprolol tartrate (LOPRESSOR) injection 2-5 mg  2-5 mg Intravenous Q2H PRN Clinton Gallant M, PA-C      . naloxone [facility] injection 0.4 mg  0.4 mg Intravenous PRN Clinton Gallant M, PA-C       And  . sodium chloride flush (NS) 0.9 % injection 9 mL  9 mL Intravenous PRN Clinton Gallant M, PA-C      . ondansetron [facility]) injection 4 mg  4 mg Intravenous Q6H PRN Clinton Gallant M, PA-C   4 mg at 07/25/16 5409  . oxyCODONE-acetaminophen (PERCOCET/ROXICET) 5-325 MG per tablet 1-2 tablet  1-2 tablet Oral Q4H PRN Clinton Gallant M, PA-C      . pantoprazole (PROTONIX) EC tablet 40 mg  40 mg Oral Daily Clinton Gallant M, PA-C   40 mg at 07/26/16 1024  . phenol (CHLORASEPTIC) mouth spray 1 spray  1 spray Mouth/Throat PRN Lars Mage, PA-C   1 spray at 07/25/16 1000  . potassium chloride SA (K-DUR,KLOR-CON) CR tablet 20-40 mEq  20-40 mEq Oral Daily PRN Clinton Gallant M, PA-C      . senna-docusate (Senokot-S) tablet 1 tablet  1 tablet Oral QHS PRN Clinton Gallant M, PA-C      . simvastatin (ZOCOR) tablet 20 mg  20 mg Oral QPM Collins, Emma M, PA-C      . sodium chloride flush (NS) 0.9 % injection 10-40 mL  10-40 mL Intracatheter Q12H Sherren Kerns, MD   10 mL at 07/26/16 1000  . sodium chloride flush (NS) 0.9 % injection 10-40 mL  10-40 mL Intracatheter PRN Sherren Kerns, MD      . tiotropium [facility]) inhalation capsule 18 mcg  18 mcg Inhalation Daily Clinton Gallant M, PA-C   18 mcg at 07/26/16 0840     Discharge Medications: Please see discharge summary for a list of discharge medications.  Relevant Imaging  Results:  Relevant Lab Results:   Additional Information SS#: [id number]  Burna Sis, LCSW

## 2016-07-26 NOTE — Progress Notes (Signed)
Patient arrived to 2W room 10.  Patient is status post AAA repair using hydromorphone PCA.  Continuous O2 monitor was applied. Telemetry monitor applied and CCMD notified.  Patient oriented to unit and room to include call light and phone.  Will continue to monitor.

## 2016-07-27 MED ORDER — HEPARIN SODIUM (PORCINE) 5000 UNIT/ML IJ SOLN
5000.0000 [IU] | Freq: Three times a day (TID) | INTRAMUSCULAR | Status: DC
  Administered 2016-07-27: 5000 [IU] via SUBCUTANEOUS
  Filled 2016-07-27: qty 1

## 2016-07-27 MED ORDER — BISACODYL 10 MG RE SUPP
10.0000 mg | Freq: Once | RECTAL | Status: AC
  Administered 2016-07-27: 10 mg via RECTAL
  Filled 2016-07-27: qty 1

## 2016-07-27 NOTE — Progress Notes (Signed)
Physical Therapy Treatment Patient Details Name: Alexandra Mays MRN: [id number] DOB: Feb 07, 1951 Today's Date: 07/27/2016    History of Present Illness Pt is a 66 yo female admitted for a aorto R femoral left common iliac bypass for AAA.  Pt underwent L common iliac endartarectomy and R common femoral endartarectomy. Pt with h/o low back pain, neck surgery and COPD.      PT Comments    Pt with improved mobility but still with poor activity tolerance, fatigue, pain, and nausea.   Follow Up Recommendations  SNF;Supervision/Assistance - 24 hour     Equipment Recommendations  Rolling walker with 5" wheels    Recommendations for Other Services       Precautions / Restrictions Precautions Precautions: Fall    Mobility  Bed Mobility Overal bed mobility: Needs Assistance Bed Mobility: Rolling;Sidelying to Sit Rolling: Min guard Sidelying to sit: Min assist       General bed mobility comments: Assist to elevate trunk into sitting  Transfers Overall transfer level: Needs assistance Equipment used: Rolling walker (2 wheeled) Transfers: Sit to/from Stand Sit to Stand: Min guard         General transfer comment: Assist for balance and support  Ambulation/Gait Ambulation/Gait assistance: Min guard Ambulation Distance (Feet): 90 Feet Assistive device: Rolling walker (2 wheeled) Gait Pattern/deviations: Step-through pattern;Decreased step length - right;Decreased step length - left Gait velocity: decr Gait velocity interpretation: Below normal speed for age/gender General Gait Details: Assist for balance and safety. Pt took 1 standing rest break. Pt amb on 4l of O2. SpO2 after amb 90%.   Stairs            Wheelchair Mobility    Modified Rankin (Stroke Patients Only)       Balance Overall balance assessment: Needs assistance Sitting-balance support: No upper extremity supported;Feet supported Sitting balance-Leahy Scale: Good     Standing balance support:  Single extremity supported Standing balance-Leahy Scale: Poor Standing balance comment: UE support                            Cognition Arousal/Alertness: Awake/alert Behavior During Therapy: WFL for tasks assessed/performed Overall Cognitive Status: Within Functional Limits for tasks assessed                                        Exercises      General Comments        Pertinent Vitals/Pain Pain Assessment: Faces Faces Pain Scale: Hurts even more Pain Location: abdomen Pain Descriptors / Indicators: Grimacing Pain Intervention(s): Limited activity within patient's tolerance;Monitored during session;PCA encouraged    Home Living                      Prior Function            PT Goals (current goals can now be found in the care plan section) Acute Rehab PT Goals PT Goal Formulation: With patient Time For Goal Achievement: 08/03/16 Potential to Achieve Goals: Good Progress towards PT goals: Goals met and updated - see care plan    Frequency    Min 3X/week      PT Plan Current plan remains appropriate    Co-evaluation PT/OT/SLP Co-Evaluation/Treatment: Yes Reason for Co-Treatment: Other (comment) (Pt's limited activity tolerance) PT goals addressed during session: Mobility/safety with mobility;Proper use of DME  AM-PAC PT "6 Clicks" Daily Activity  Outcome Measure  Difficulty turning over in bed (including adjusting bedclothes, sheets and blankets)?: A Little Difficulty moving from lying on back to sitting on the side of the bed? : Total Difficulty sitting down on and standing up from a chair with arms (e.g., wheelchair, bedside commode, etc,.)?: Total Help needed moving to and from a bed to chair (including a wheelchair)?: A Little Help needed walking in hospital room?: A Little Help needed climbing 3-5 steps with a railing? : A Lot 6 Click Score: 13    End of Session Equipment Utilized During Treatment:  Oxygen Activity Tolerance: Patient limited by fatigue Patient left: in chair;with call bell/phone within reach;with family/visitor present Nurse Communication: Mobility status PT Visit Diagnosis: Pain;Muscle weakness (generalized) (M62.81);Difficulty in walking, not elsewhere classified (R26.2) Pain - part of body:  (abdomen)     Time: 1222-4114 PT Time Calculation (min) (ACUTE ONLY): 33 min  Charges:  $Gait Training: 8-22 mins                    G Codes:       [place] PT [place] 07/27/2016, 2:20 PM

## 2016-07-27 NOTE — Progress Notes (Signed)
Patient education completed for removal of central line, RIJ removed as ordered, well tolerated,patient now on bedrest will continue to monitor

## 2016-07-27 NOTE — Progress Notes (Addendum)
  Progress Note    07/27/2016 7:41 AM 3 Days Post-Op  Subjective:  C/o pain; [facility]  Afebrile  HR 90's  120's systolic 97% 4LO2NC  Vitals:   07/27/16 0400 07/27/16 0413  BP:  128/61  Pulse:  90  Resp: 11 16  Temp:  98.6 F (37 C)    Physical Exam: Cardiac:  regular Lungs:  Mildly labored Incisions:  Healing nicely Extremities:  Easily palpable DP pulses bilaterally Abdomen:  Mildly distended; +BS; -flatus; -BM; -vomiting and only transient nausea x 1  CBC    Component Value Date/Time   WBC 11.4 (H) 07/26/2016 0340   RBC 3.71 (L) 07/26/2016 0340   HGB 11.4 (L) 07/26/2016 0340   HCT 33.7 (L) 07/26/2016 0340   PLT 36 (L) 07/26/2016 0340   MCV 90.8 07/26/2016 0340   MCH 30.7 07/26/2016 0340   MCHC 33.8 07/26/2016 0340   RDW 16.7 (H) 07/26/2016 0340    BMET    Component Value Date/Time   NA 135 07/25/2016 0350   K 3.7 07/25/2016 0350   CL 111 07/25/2016 0350   CO2 20 (L) 07/25/2016 0350   GLUCOSE 159 (H) 07/25/2016 0350   BUN 14 07/25/2016 0350   CREATININE 0.89 07/25/2016 0350   CALCIUM 7.3 (L) 07/25/2016 0350   GFRNONAA >60 07/25/2016 0350   GFRAA >60 07/25/2016 0350    INR    Component Value Date/Time   INR 1.48 07/24/2016 1306     Intake/Output Summary (Last 24 hours) at 07/27/16 0741 Last data filed at 07/26/16 1100  Gross per 24 hour  Intake              625 ml  Output              300 ml  Net              325 ml     Assessment:  66 y.o. female is s/p:  Aorto right femoral left common iliac artery bypass for abdominal aortic aneurysm, left common iliac endarterectomy, right common femoral endarterectomy  3 Days Post-Op  Plan: -pt still with pain issues this morning.  Continue PCA for now.  Talked to pt about importance of getting out of bed.  Will order OOB to chair tid at meal times. -still minimal bowel function.  Only one episode of nausea yesterday after getting back in bed but none otherwise.  She does have good  BS.  Will order dulcolax supp this morning to help bowel function. -incisions are healing nicely -she is not on DVT prophylaxis-will start SQ heparin today if okay with Dr. Darrick PennaFields -will d/w Dr. Darrick PennaFields about removing central line and starting another IV so that the neck lines/introducer can be removed.    -IS 10x/hr -CBC and BMP in the am    Doreatha MassedSamantha Rhyne, PA-C [facility] [phone] 07/27/2016 7:41 AM  Agree with above D/c central line if can establish reliable peripheral IV Ok for subcutaneous heparin If vomits make NPO  Fabienne Brunsharles Elaine Middleton, MD [facility] [place] Office: [phone] Pager: [phone]

## 2016-07-27 NOTE — Progress Notes (Signed)
2.735ml of dilaudid wasted from previous syringe before new syringe administered. Gregor HamsAlisha Cheryllynn Sarff, RN

## 2016-07-27 NOTE — Progress Notes (Signed)
Occupational Therapy Treatment Patient Details Name: Alexandra Mays MRN: [id number] DOB: [birth year] Today's Date: 07/27/2016    History of present illness Pt is a 66 yo female admitted for a aorto R femoral left common iliac bypass for AAA.  Pt underwent L common iliac endartarectomy and R common femoral endartarectomy. Pt with h/o low back pain, neck surgery and COPD.     OT comments  Pt progressing slowly due to pain, fatigue and nausea. Needing moderate to maximum assistance for ADL. Dependent on 4L 02. Updated d/c plan to SNF, but may be able to go home with HHOT depending on progress and availability of assistance at home.  Follow Up Recommendations  SNF;Supervision/Assistance - 24 hour    Equipment Recommendations  Tub/shower seat    Recommendations for Other Services      Precautions / Restrictions Precautions Precautions: Fall       Mobility Bed Mobility Overal bed mobility: Needs Assistance Bed Mobility: Rolling;Sidelying to Sit Rolling: Min guard Sidelying to sit: Min assist       General bed mobility comments: Assist to elevate trunk into sitting  Transfers Overall transfer level: Needs assistance Equipment used: Rolling walker (2 wheeled) Transfers: Sit to/from Stand Sit to Stand: Min guard         General transfer comment: Assist for balance and support    Balance Overall balance assessment: Needs assistance Sitting-balance support: No upper extremity supported;Feet supported Sitting balance-Leahy Scale: Good     Standing balance support: Single extremity supported Standing balance-Leahy Scale: Poor Standing balance comment: UE support                           ADL either performed or assessed with clinical judgement   ADL Overall ADL's : Needs assistance/impaired Eating/Feeding: Set up;Sitting   Grooming: Set up;Sitting           Upper Body Dressing : Sitting;Maximal assistance Upper Body Dressing Details (indicate cue  type and reason): lines and nausea interfering                 Functional mobility during ADLs: Minimal assistance;+2 for safety/equipment General ADL Comments: Pain and nausea limit participation.     Vision       Perception     Praxis      Cognition Arousal/Alertness: Awake/alert Behavior During Therapy: WFL for tasks assessed/performed Overall Cognitive Status: Within Functional Limits for tasks assessed                                 General Comments: pt with nausea, pain limiting tolerance of activity        Exercises     Shoulder Instructions       General Comments      Pertinent Vitals/ Pain       Pain Assessment: Faces Faces Pain Scale: Hurts even more Pain Location: abdomen Pain Descriptors / Indicators: Grimacing Pain Intervention(s): Monitored during session;Repositioned;Limited activity within patient's tolerance  Home Living                                          Prior Functioning/Environment              Frequency  Min 3X/week        Progress Toward Goals  OT  Goals(current goals can now be found in the care plan section)  Progress towards OT goals: Not progressing toward goals - comment (pain and nausea)  Acute Rehab OT Goals Patient Stated Goal: to get rid of the pain OT Goal Formulation: With patient Time For Goal Achievement: 08/08/16 Potential to Achieve Goals: Good  Plan Discharge plan needs to be updated    Co-evaluation    PT/OT/SLP Co-Evaluation/Treatment: Yes Reason for Co-Treatment: Other (comment) (pt unlikely to tolerate both therapies) PT goals addressed during session: Mobility/safety with mobility;Proper use of DME OT goals addressed during session: ADL's and self-care      AM-PAC PT "6 Clicks" Daily Activity     Outcome Measure   Help from another person eating meals?: None Help from another person taking care of personal grooming?: A Little Help from another  person toileting, which includes using toliet, bedpan, or urinal?: A Lot Help from another person bathing (including washing, rinsing, drying)?: A Lot Help from another person to put on and taking off regular upper body clothing?: A Lot Help from another person to put on and taking off regular lower body clothing?: A Lot 6 Click Score: 15    End of Session Equipment Utilized During Treatment: Oxygen;Gait belt;Rolling walker  OT Visit Diagnosis: Unsteadiness on feet (R26.81);Muscle weakness (generalized) (M62.81);Pain   Activity Tolerance Patient limited by pain;Treatment limited secondary to medical complications (Comment) (nausea)   Patient Left in chair;with call bell/phone within reach;with family/visitor present   Nurse Communication Mobility status        Time: 8295-62131139-1213 OT Time Calculation (min): 34 min  Charges: OT General Charges $OT Visit: 1 Procedure OT Treatments $Therapeutic Activity: 8-22 mins     Evern BioMayberry, Christepher Melchior Lynn 07/27/2016, 2:31 PM [phone]

## 2016-07-27 NOTE — Care Management Note (Signed)
Case Management Note Alexandra PieriniKristi Laniesha Das RN, BSN Unit 2W-Case Manager-- 2H coverage [phone]  Patient Details  Name: Alexandra Mays MRN: [id number] Date of Birth: 09/12/1950  Subjective/Objective:   Pt admitted s/p Aorto right femoral left common iliac artery bypass for abdominal aortic aneurysm, left common iliac endarterectomy, right common femoral endarterectomy- on 07/24/16                 Action/Plan: PTA pt lived at home- independent- CM to follow for d/c needs.  Received notice from Tiffany with [facility]- they have a  Pre-op referral for any Home Health needs at discharge- [facility]   Expected Discharge Date:                  Expected Discharge Plan:  Skilled Nursing Facility  In-House Referral:  Clinical Social Work  Discharge planning Services  CM Consult  Post Acute Care Choice:    Choice offered to:     DME Arranged:    DME Agency:     HH Arranged:    HH Agency:  [facility]  Status of Service:  In process, will continue to follow  If discussed at Long Length of Stay Meetings, dates discussed:    Discharge Disposition:   Additional Comments:  07/27/16- 1500- Alexandra PieriniKristi Raye Wiens RN, CM- [facility] to 2[facility] on 07/26/16-  Per PT/OT recommendations for SNF- CSW has been consulted and following for SNF placement. Pt on cl. Liquid diet today.   07/25/16- 1020- Alexandra Berber RN CM- pt post op day 1- to keep NGT for today and PCA- remains NPO for now- CM to follow as pt progresses for d/c needs  Alexandra Mays, Alexandra Lofaro Hall, RN 07/27/2016, 3:38 PM

## 2016-07-28 LAB — BASIC METABOLIC PANEL
ANION GAP: 6 (ref 5–15)
BUN: 8 mg/dL (ref 6–20)
CHLORIDE: 103 mmol/L (ref 101–111)
CO2: 24 mmol/L (ref 22–32)
CREATININE: 0.63 mg/dL (ref 0.44–1.00)
Calcium: 7.6 mg/dL — ABNORMAL LOW (ref 8.9–10.3)
GFR calc non Af Amer: >60 mL/min (ref >60)
Glucose, Bld: 102 mg/dL — ABNORMAL HIGH (ref 65–99)
POTASSIUM: 3.8 mmol/L (ref 3.5–5.1)
SODIUM: 133 mmol/L — AB (ref 135–145)

## 2016-07-28 LAB — CBC
HEMATOCRIT: 29.3 % — AB (ref 36.0–46.0)
HEMOGLOBIN: 10 g/dL — AB (ref 12.0–15.0)
MCH: 31.2 pg (ref 26.0–34.0)
MCHC: 34.1 g/dL (ref 30.0–36.0)
MCV: 91.3 fL (ref 78.0–100.0)
Platelets: 64 10*3/uL — ABNORMAL LOW (ref 150–400)
RBC: 3.21 MIL/uL — AB (ref 3.87–5.11)
RDW: 15.5 % (ref 11.5–15.5)
WBC: 6.6 10*3/uL (ref 4.0–10.5)

## 2016-07-28 MED ORDER — HEPARIN SODIUM (PORCINE) 5000 UNIT/ML IJ SOLN
5000.0000 [IU] | Freq: Three times a day (TID) | INTRAMUSCULAR | Status: DC
  Administered 2016-07-28 – 2016-07-31 (×11): 5000 [IU] via SUBCUTANEOUS
  Filled 2016-07-28 (×11): qty 1

## 2016-07-28 MED ORDER — ENSURE ENLIVE PO LIQD
237.0000 mL | Freq: Three times a day (TID) | ORAL | Status: DC
  Administered 2016-07-28 – 2016-07-29 (×2): 237 mL via ORAL

## 2016-07-28 MED FILL — Hydromorphone HCl Preservative Free (PF) Inj 10 MG/ML: INTRAMUSCULAR | Qty: 2.5 | Status: AC

## 2016-07-28 MED FILL — Sodium Chloride IV Soln 0.9%: INTRAVENOUS | Qty: 22.5 | Status: AC

## 2016-07-28 NOTE — Progress Notes (Addendum)
[facility] [place]  Subjective  - doing well.  Had 3 BM's yesterday.  No N/V/D   Objective (!) 108/59 84 98.1 F (36.7 C) (Oral) 14 97%  Intake/Output Summary (Last 24 hours) at 07/28/16 16100728 Last data filed at 07/27/16 1315  Gross per 24 hour  Intake              180 ml  Output                0 ml  Net              180 ml    Palpable DP B 2+ Abdomin with +BS, non distended Heart RRR Lungs non labored breathing  Assessment/Planning: 66 y.o. female is s/p:  Aorto right femoral left common iliac artery bypass for abdominal aortic aneurysm, left common iliac endarterectomy, right common femoral endarterectomy  4 Days Post-Op  DVT prophylaxis-will start SQ heparin  Rolling walker ordered for home OOB as tolerates PT/OT Possible discharge Mon.  Encourage PO pain medication and wean off PCA over the next day or 2.   Alexandra GallantCOLLINS, Alexandra Mays 07/28/2016 7:28 AM --  Laboratory Lab Results:  Recent Labs  07/26/16 0340 07/28/16 0327  WBC 11.4* 6.6  HGB 11.4* 10.0*  HCT 33.7* 29.3*  PLT 36* 64*   BMET  Recent Labs  07/28/16 0327  NA 133*  K 3.8  CL 103  CO2 24  GLUCOSE 102*  BUN 8  CREATININE 0.63  CALCIUM 7.6*    COAG Lab Results  Component Value Date   INR 1.48 07/24/2016   INR 1.06 07/21/2016   No results found for: PTT  Addendum  I have independently interviewed and examined the patient, and I agree with the physician assistant's findings.  Advance diet as tolerated.   Alexandra SakeBrian Dilan Fullenwider, MD, FACS [facility] [place] Office: [phone] Pager: [phone]  07/28/2016, 4:45 PM

## 2016-07-28 NOTE — Care Management Important Message (Signed)
Important Message  Patient Details  Name: Alexandra Mays MRN: [id number] Date of Birth: 11/07/1950   Medicare Important Message Given:  Yes    Theodora Lalanne Abena 07/28/2016, 3:09 PM

## 2016-07-28 NOTE — Progress Notes (Signed)
Pharmacy paged twice notify them that breo ellipta spiriva were not available on the unit and that they were scheduled to be given at 1000.Will continue to monitor.

## 2016-07-28 NOTE — Progress Notes (Signed)
Physical Therapy Treatment Patient Details Name: Alexandra Mays MRN: [id number] DOB: 1951/01/06 Today's Date: 07/28/2016    History of Present Illness Pt is a 66 yo female admitted for a aorto R femoral left common iliac bypass for AAA.  Pt underwent L common iliac endartarectomy and R common femoral endartarectomy. Pt with h/o low back pain, neck surgery and COPD.      PT Comments    Pt with less reliance on upper extremities for gait and decreased O2 requirements this visit.  She also did not take any standing rest breaks during gait today. If she is still here on Monday I would like to try without the RW and see how her O2 sats do on RA during gait as she did not use home O2 PTA.  He gait speed still makes her a high fall risk, so having a RW for home use while she is alone is a good idea, but I would like to work with her without next session.   Follow Up Recommendations  Home health PT;Supervision - Intermittent     Equipment Recommendations  Rolling walker with 5" wheels    Recommendations for Other Services   NA     Precautions / Restrictions Precautions Precautions: Fall;Other (comment) Precaution Comments: monitor O2 sats during gait.     Mobility  Bed Mobility Overal bed mobility: Needs Assistance   Rolling: Modified independent (Device/Increase time) Sidelying to sit: Modified independent (Device/Increase time)   Sit to supine: Modified independent (Device/Increase time)   General bed mobility comments: Pt relying lightly on bed rail and HOB elevated ~30 degrees   Transfers Overall transfer level: Needs assistance Equipment used: Rolling walker (2 wheeled) Transfers: Sit to/from Stand Sit to Stand: Supervision         General transfer comment: supervision for safety  Ambulation/Gait Ambulation/Gait assistance: Supervision Ambulation Distance (Feet): 120 Feet Assistive device: Rolling walker (2 wheeled) Gait Pattern/deviations: Step-through  pattern;Shuffle;Trunk flexed Gait velocity: decreased, 1.13 ft/sec Gait velocity interpretation: <1.8 ft/sec, indicative of risk for recurrent falls General Gait Details: Pt with slow, fairly steady gait pattern, started out walking with all 4 points of RW off of the ground.  Light reliance on hands for support during gait.  DOE 2/4 and O2 sats right at 90% on 2 L O2 [place]. No standing rest breaks today.  Cues for safe RW use.           Balance Overall balance assessment: Needs assistance Sitting-balance support: Feet supported;No upper extremity supported Sitting balance-Leahy Scale: Good     Standing balance support: Bilateral upper extremity supported;No upper extremity supported;Single extremity supported Standing balance-Leahy Scale: Fair Standing balance comment: leans against sink in standing to wash hands.                             Cognition Arousal/Alertness: Awake/alert Behavior During Therapy: WFL for tasks assessed/performed Overall Cognitive Status: Within Functional Limits for tasks assessed                                           General Comments General comments (skin integrity, edema, etc.): Pt nauseated and fatiuged at end of session, wanted to get back in bed.       Pertinent Vitals/Pain Pain Assessment: Faces Faces Pain Scale: Hurts even more Pain Location: abdomen Pain Descriptors / Indicators:  Grimacing Pain Intervention(s): Limited activity within patient's tolerance;Monitored during session;Repositioned;PCA encouraged           PT Goals (current goals can now be found in the care plan section) Acute Rehab PT Goals Patient Stated Goal: to get rid of the pain and to go home Progress towards PT goals: Progressing toward goals    Frequency    Min 3X/week      PT Plan Discharge plan needs to be updated       AM-PAC PT "6 Clicks" Daily Activity  Outcome Measure  Difficulty turning over in bed (including  adjusting bedclothes, sheets and blankets)?: A Little Difficulty moving from lying on back to sitting on the side of the bed? : A Little Difficulty sitting down on and standing up from a chair with arms (e.g., wheelchair, bedside commode, etc,.)?: None Help needed moving to and from a bed to chair (including a wheelchair)?: None Help needed walking in hospital room?: None Help needed climbing 3-5 steps with a railing? : A Little 6 Click Score: 21    End of Session Equipment Utilized During Treatment: Oxygen (2 L O2 [place]) Activity Tolerance: Patient limited by pain;Patient limited by fatigue Patient left: in bed;with call bell/phone within reach   PT Visit Diagnosis: Pain;Muscle weakness (generalized) (M62.81);Difficulty in walking, not elsewhere classified (R26.2) Pain - Right/Left: Right Pain - part of body: Leg     Time: [id number] PT Time Calculation (min) (ACUTE ONLY): 20 min  Charges:  $Gait Training: 8-22 mins                    Brinkley Peet B. Rafaela Dinius, PT, DPT [phone]   07/28/2016, 5:04 PM

## 2016-07-29 MED ORDER — OXYCODONE-ACETAMINOPHEN 5-325 MG PO TABS: 1.0000 | ORAL_TABLET | ORAL | Status: DC | PRN

## 2016-07-29 NOTE — Progress Notes (Signed)
14 ml of hydromorphone 1mg /ml wasted in the sink by this RN and Brain HiltsAmanda, Davis RN.

## 2016-07-29 NOTE — Progress Notes (Addendum)
[facility] [place]  Subjective  - Doing a little better, still using PCA for pain control with PO percocet.   Objective (!) 112/54 81 97.7 F (36.5 C) (Oral) 16 95%  Intake/Output Summary (Last 24 hours) at 07/29/16 0801 Last data filed at 07/28/16 0853  Gross per 24 hour  Intake              120 ml  Output                0 ml  Net              120 ml    Palpable DP pulses B Abdomin soft, incision healing well Right groin soft without hematoma, no erythema Heart RRR Lungs non labored  Assessment/Planning: POD # 5 65 y.o.femaleis s/p:  Aorto right femoral left common iliac artery bypass for abdominal aortic aneurysm, left common iliac endarterectomy, right common femoral endarterectomy 4 Days Post-Op  DVT prophylaxis-will start SQ heparin  Tolerating PO)'s with little appetite  HGB stable 10.0 Encouraged wean off PCA   COLLINS, EMMA [name] 07/29/2016 8:01 AM --  Addendum  I have independently interviewed and examined the patient, and I agree with the physician assistant's findings.  D/C PCA.  Reportedly was told she could leave on Monday   Leonides SakeBrian Craig Ionescu, MD, FACS [facility] [place] Office: [phone] Pager: [phone]  07/29/2016, 9:19 AM    Laboratory Lab Results:  Recent Labs  07/28/16 0327  WBC 6.6  HGB 10.0*  HCT 29.3*  PLT 64*   BMET  Recent Labs  07/28/16 0327  NA 133*  K 3.8  CL 103  CO2 24  GLUCOSE 102*  BUN 8  CREATININE 0.63  CALCIUM 7.6*    COAG Lab Results  Component Value Date   INR 1.48 07/24/2016   INR 1.06 07/21/2016   No results found for: PTT

## 2016-07-29 NOTE — Progress Notes (Signed)
Patient ambulated 11800ft in the hall on 2L O2. Patient maintained an O2 sat greater than 92% during ambulation. Patient c/o of been weak, she was encouraged to eat more since she has not been eating much. Patient now resting quietly in her bed, bed in lowest position, call bell within reach will continue to monitor.

## 2016-07-30 MED ORDER — MEGESTROL ACETATE 400 MG/10ML PO SUSP
400.0000 mg | Freq: Every day | ORAL | Status: DC
  Administered 2016-07-30 – 2016-07-31 (×2): 400 mg via ORAL
  Filled 2016-07-30 (×2): qty 10

## 2016-07-30 MED ORDER — ONDANSETRON HCL 4 MG/2ML IJ SOLN
4.0000 mg | Freq: Four times a day (QID) | INTRAMUSCULAR | Status: DC | PRN
  Administered 2016-07-30: 4 mg via INTRAVENOUS
  Filled 2016-07-30: qty 2

## 2016-07-30 NOTE — Progress Notes (Addendum)
[facility] [place]  Subjective  - Stomach ache.  Has had 3 BM's since surgery.  Has felt bad since last night supper.   Objective (!) 129/57 78 98.1 F (36.7 C) (Oral) 20 97%  Intake/Output Summary (Last 24 hours) at 07/30/16 0808 Last data filed at 07/30/16 0718  Gross per 24 hour  Intake              840 ml  Output              120 ml  Net              720 ml   Palpable DP pulses B Abdomin soft, incision healing well, positive BS  Right groin soft without hematoma, no erythema Heart RRR Lungs non labored   Assessment/Planning: POD # 5 66 y.o.femaleis s/p:  Aorto right femoral left common iliac artery bypass for abdominal aortic aneurysm, left common iliac endarterectomy, right common femoral endarterectomy 5Days Post-Op  Off PCA as of yesterday Ambulating more Needs better nutrition, but has little appetite Will order new labs for am   Clinton GallantCOLLINS, EMMA [facility][name] 07/30/2016 8:08 AM  Addendum  I have independently interviewed and examined the patient, and I agree with the physician assistant's findings.  Reported some nausea last night due to forced PO intake.  Pt notes baseline limited PO intake.    - Add megace - Home possibly tomorrow if PO improved   Leonides SakeBrian Arleigh Dicola, MD, FACS [facility] [place] Office: [phone] Pager: [phone]  07/30/2016, 9:48 AM   --  Laboratory Lab Results:  Recent Labs  07/28/16 0327  WBC 6.6  HGB 10.0*  HCT 29.3*  PLT 64*   BMET  Recent Labs  07/28/16 0327  NA 133*  K 3.8  CL 103  CO2 24  GLUCOSE 102*  BUN 8  CREATININE 0.63  CALCIUM 7.6*    COAG Lab Results  Component Value Date   INR 1.48 07/24/2016   INR 1.06 07/21/2016   No results found for: PTT

## 2016-07-31 LAB — CBC
HCT: 32.8 % — ABNORMAL LOW (ref 36.0–46.0)
HEMOGLOBIN: 10.7 g/dL — AB (ref 12.0–15.0)
MCH: 29.3 pg (ref 26.0–34.0)
MCHC: 32.6 g/dL (ref 30.0–36.0)
MCV: 89.9 fL (ref 78.0–100.0)
Platelets: 168 10*3/uL (ref 150–400)
RBC: 3.65 MIL/uL — AB (ref 3.87–5.11)
RDW: 15.1 % (ref 11.5–15.5)
WBC: 6.4 10*3/uL (ref 4.0–10.5)

## 2016-07-31 LAB — BASIC METABOLIC PANEL WITH GFR
Anion gap: 10 (ref 5–15)
BUN: 9 mg/dL (ref 6–20)
CO2: 23 mmol/L (ref 22–32)
Calcium: 8.1 mg/dL — ABNORMAL LOW (ref 8.9–10.3)
Chloride: 103 mmol/L (ref 101–111)
Creatinine, Ser: 0.75 mg/dL (ref 0.44–1.00)
GFR calc Af Amer: >60 mL/min (ref >60)
GFR calc non Af Amer: >60 mL/min (ref >60)
Glucose, Bld: 79 mg/dL (ref 65–99)
Potassium: 3.6 mmol/L (ref 3.5–5.1)
Sodium: 136 mmol/L (ref 135–145)

## 2016-07-31 MED ORDER — OXYCODONE-ACETAMINOPHEN 5-325 MG PO TABS: 1.0000 | ORAL_TABLET | Freq: Four times a day (QID) | ORAL | 0 refills | Status: DC | PRN

## 2016-07-31 NOTE — Progress Notes (Addendum)
  Progress Note    07/31/2016 1:43 PM 7 Days Post-Op  Subjective:  No complaints; no appetite, but states she didn't have appetite before surgery  Afebrile HR  70's  130's systolic 92% 2LO2NC  Vitals:   07/30/16 2012 07/31/16 0510  BP: 137/62 (!) 139/56  Pulse: 77 77  Resp: 18 18  Temp: 98.8 F (37.1 C) 98.6 F (37 C)    Physical Exam: Cardiac:  regular Lungs:  Non labored Incisions:  Healing nicely Extremities:  Easily palpable bilateral femoral pulses Abdomen:  Soft, NT/ND;+BM  CBC    Component Value Date/Time   WBC 6.4 07/31/2016 0910   RBC 3.65 (L) 07/31/2016 0910   HGB 10.7 (L) 07/31/2016 0910   HCT 32.8 (L) 07/31/2016 0910   PLT 168 07/31/2016 0910   MCV 89.9 07/31/2016 0910   MCH 29.3 07/31/2016 0910   MCHC 32.6 07/31/2016 0910   RDW 15.1 07/31/2016 0910    BMET    Component Value Date/Time   NA 136 07/31/2016 0910   K 3.6 07/31/2016 0910   CL 103 07/31/2016 0910   CO2 23 07/31/2016 0910   GLUCOSE 79 07/31/2016 0910   BUN 9 07/31/2016 0910   CREATININE 0.75 07/31/2016 0910   CALCIUM 8.1 (L) 07/31/2016 0910   GFRNONAA >60 07/31/2016 0910   GFRAA >60 07/31/2016 0910    INR    Component Value Date/Time   INR 1.48 07/24/2016 1306     Intake/Output Summary (Last 24 hours) at 07/31/16 1343 Last data filed at 07/31/16 0900  Gross per 24 hour  Intake              540 ml  Output                0 ml  Net              540 ml     Assessment:  66 y.o. female is s/p:  Aorto right femoral left common iliac artery bypass for abdominal aortic aneurysm, left common iliac endarterectomy, right common femoral endarterectomy  7 Days Post-Op  Plan: -pt feeling better today and wants to go home. -she is ambulating well in the hallways -15 beat run of VT this am-asymptomatic-CBC/BMP ordered and normal.  Dr. Arbie CookeyEarly okay with discharge -pt's O2 sats 93-94% on room air. -case management for HHPT/OT and shower seat -f/u with Dr. Darrick PennaFields in 2-3 weeks.    -encouraged pt to work with IS at home -discharge home today   Doreatha MassedSamantha Rhyne, [place] JerseyPA-C [facility] [phone] 07/31/2016 1:43 PM

## 2016-07-31 NOTE — Consult Note (Signed)
[facility] CM Primary Care Navigator  07/31/2016  Alexandra Mays 25-Feb-1951 [id number]    Met with patientat the bedside to identify possible discharge needs. Patient reports that she returned back for further follow-up and treatment of abdominal aneurysm that had increased in size which resulted to this admission/ surgery. . Patient endorses Daphene Jaeger, [facility] with [facility] as the primary care provider.   Patient shared using [facility] in [facility] obtain medications without difficulty.   Patient verbalized managing her own medications at home using "pill box" systemweekly.   She reports being able to drive prior to admission and granddaughter Kristin Bruins) can providetransportation toher doctors' appointmentsif needed after discharge.  Patient is active and independent with self care prior to admission. She states that she lives with her granddaughter who will be herprimary caregiver at home.  Plan for discharge is home with home health services according to patient.  Patient voiced understanding to call primary care provider's office for a post discharge follow-up appointment within a week or sooner if needs arise.Patient letter (with PCP's contact number) was provided as a reminder.   Discussed with patient about [facility] CM services available for health management but she denies any other needs or concerns at this time.  [facility] care management contact information provided for future needs that she may have.   For questions, please contact:  Dannielle Huh, BSN, RN- [facility] Primary Care Navigator  Telephone: [phone] [place]

## 2016-07-31 NOTE — Discharge Summary (Signed)
AAA Discharge Summary    Chauncy Leanatricia A Schellinger 01/06/1951 66 y.o. female  [id number]  Admission Date: 07/24/2016  Discharge Date: 07/31/16  Physician: Sherren KernsFields, Charles E, MD  Admission Diagnosis: AAA (abdominal aortic aneurysm) [facility](HCC) [I71.4]   HPI:   This is a 66 y.o. female seen several weeks ago for abdominal aortic aneurysm. The aneurysm was first noticed in February 2017. At that time was 4.7 cm in diameter. She was recently seen in the emergency room and had a CT scan performed for abdominal and back pain. The aneurysm was 5.1 cm in diameter on that exam. During that ER evaluation she was noted to have severe constipation and chronic back pain from L4-L5 compression. She returns today for further follow-up. She states that her pain is completely resolved. Other medical problems include COPD and elevated cholesterol. She also smokes half a pack of cigarettes per day and has a long smoking history in the past. Greater than 3 minutes they spent regarding smoking cessation counseling.  Hospital Course:  The patient was admitted to the hospital and taken to the operating room on 07/24/2016 and underwent: Aorto right femoral left common iliac artery bypass for abdominal aortic aneurysm, left common iliac endarterectomy, right common femoral endarterectomy    The pt tolerated the procedure well and was transported to the PACU in good condition. Intraoperative findings as follows:  #1 severe calcific iliac disease bilaterally #2 18 x 9 mm Dacron graft right limb to common femoral left limb to left common iliac.  By POD 1, she was hemodynamically stable.  Her swan was discontinued.  She was having uncontrolled pain issues and a PCA was started.  She did have thrombocytopenia and a HIT panel was drawn and heparin held.  Her platelet count dropped as low as 36k but is 168k at discharge.  Her HIT panel was negative.   By POD 2, she had 2+ DP pulses.  She did not have any GI function.  She was  transferred to telemetry floor.  Her foley was discontinued.    By POD 3, pt's central line was removed and regular IV access obtained.  Continue to encourage IS.    By POD 4, she was doing well.  She was weaned off the PCA.  Continue OOB to chair.   Pt seen by PT and HHPT recommended.  Pt was tolerating po's by this point.    On POD 5, she did have a little bit of nausea, but this improved.    On POD 7, she was feeling better and wanting to go home.  She did have a 15 beat run of VT this am-asymptomatic-CBC/BMP ordered and normal.  Dr. Arbie CookeyEarly okay with discharge.  She was taken off supplemental oxygen and pt's O2 saturations were 93-94% on room air.  Case management consult for HHPT/OT and shower seat.  She is ambulating well in the hallways.   She did have acute surgical blood loss anemia that is improving post operatively.   The remainder of the hospital course consisted of increasing mobilization and increasing intake of solids without difficulty.  CBC    Component Value Date/Time   WBC 6.4 07/31/2016 0910   RBC 3.65 (L) 07/31/2016 0910   HGB 10.7 (L) 07/31/2016 0910   HCT 32.8 (L) 07/31/2016 0910   PLT 168 07/31/2016 0910   MCV 89.9 07/31/2016 0910   MCH 29.3 07/31/2016 0910   MCHC 32.6 07/31/2016 0910   RDW 15.1 07/31/2016 0910    BMET  Component Value Date/Time   NA 136 07/31/2016 0910   K 3.6 07/31/2016 0910   CL 103 07/31/2016 0910   CO2 23 07/31/2016 0910   GLUCOSE 79 07/31/2016 0910   BUN 9 07/31/2016 0910   CREATININE 0.75 07/31/2016 0910   CALCIUM 8.1 (L) 07/31/2016 0910   GFRNONAA >60 07/31/2016 0910   GFRAA >60 07/31/2016 0910     Discharge Instructions    ABDOMINAL PROCEDURE/ANEURYSM REPAIR/AORTO-BIFEMORAL BYPASS:  Call MD for increased abdominal pain; cramping diarrhea; nausea/vomiting    Complete by:  As directed    Call MD for:  redness, tenderness, or signs of infection (pain, swelling, bleeding, redness, odor or green/yellow discharge around  incision site)    Complete by:  As directed    Call MD for:  severe or increased pain, loss or decreased feeling  in affected limb(s)    Complete by:  As directed    Call MD for:  temperature >100.5    Complete by:  As directed    Discharge wound care:    Complete by:  As directed    Wash the groin wound with soap and water daily and pat dry. (No tub bath-only shower)  Then put a dry gauze or washcloth there to keep this area dry daily and as needed.  Do not use Vaseline or neosporin on your incisions.  Only use soap and water on your incisions and then protect and keep dry.   Driving Restrictions    Complete by:  As directed    No driving for 2 weeks   Lifting restrictions    Complete by:  As directed    No lifting for 3 weeks   Resume previous diet    Complete by:  As directed       Discharge Diagnosis:  AAA (abdominal aortic aneurysm) (HCC) [I71.4]  Secondary Diagnosis: Patient Active Problem List   Diagnosis Date Noted  . S/P aortic aneurysm repair 07/24/2016  . AAA (abdominal aortic aneurysm) (HCC) 07/24/2016  . AAA (abdominal aortic aneurysm) without rupture (HCC) 04/21/2015  . Mild cognitive impairment with memory loss 02/17/2015  . Depressive disorder 02/17/2015   Past Medical History:  Diagnosis Date  . AAA (abdominal aortic aneurysm) (HCC)    AAA  . COPD (chronic obstructive pulmonary disease) (HCC)   . Dyspnea    with exertion  . Pure hyperglyceridemia      Allergies as of 07/31/2016      Reactions   No Known Allergies       Medication List    TAKE these medications   albuterol 108 (90 Base) MCG/ACT inhaler Commonly known as:  PROVENTIL HFA;VENTOLIN HFA Inhale 2 puffs into the lungs every 6 (six) hours as needed for wheezing or shortness of breath.   BREO ELLIPTA 100-25 MCG/INH Aepb Generic drug:  fluticasone furoate-vilanterol Inhale 1 puff into the lungs daily.   oxyCODONE-acetaminophen 5-325 MG tablet Commonly known as:  PERCOCET/ROXICET Take  1 tablet by mouth every 6 (six) hours as needed for moderate pain.   polyethylene glycol packet Commonly known as:  MIRALAX / GLYCOLAX Take 17 g by mouth daily.   simvastatin 20 MG tablet Commonly known as:  ZOCOR Take 20 mg by mouth every evening.   SPIRIVA RESPIMAT 2.5 MCG/ACT Aers Generic drug:  Tiotropium Bromide Monohydrate Inhale 2 puffs into the lungs daily.            Durable Medical Equipment        Start  Ordered   07/31/16 1346  For home use only DME Tub bench  Once     07/31/16 1345   07/28/16 0742  For home use only DME Walker rolling  Once    Question:  Patient needs a walker to treat with the following condition  Answer:  Post-operative state   07/28/16 0744      Prescriptions given: 1.  Roxicet #30 No Refill  Instructions: 1.  No driving x 2 weeks and while taking pain medications 2.  No heavy lifting x 4 weeks. 3.  If you have groin incisions, Wash the groin wound with soap and water daily and pat dry. (No tub bath-only shower)  Then put a dry gauze or washcloth there to keep this area dry daily and as needed.  Do not use Vaseline or neosporin on your incisions.  Only use soap and water on your incisions and then protect and keep dry. 4.  Shower daily starting 07/31/16  Disposition: home  Patient's condition: is Good  Follow up: 1. Dr. Darrick Penna in 2-3 weeks   Doreatha Massed, PA-C [facility] [phone] 07/31/2016  2:09 PM   - For VQI Registry use -  Post-op:  Time to Extubation: [x]  In PACU, [ ]  < 12 hrs, [ ]  12-24 hrs, [ ]  >=24 hrs Vasopressors Req. Post-op: No ICU Stay: 2 day in ICU Transfusion: Yes   If yes, 2 units given MI: No, [ ]  Troponin only, [ ]  EKG or Clinical New Arrhythmia: Yes- 15 beat run VT asymptomatic   Complications: CHF: No Resp failure: No, [ ]  Pneumonia, [ ]  Ventilator Chg in renal function: No, [ ]  Inc. Cr > 0.5, [ ]  Temp. Dialysis, [ ]  Permanent dialysis Leg ischemia: No, no Surgery  needed, [ ]  Yes, Surgery needed, [ ]  Amputation Bowel ischemia: No, [ ]  Medical Rx, [ ]  Surgical Rx Wound complication: No, [ ]  Superficial separation/infection, [ ]  Return to OR Return to OR: No  Return to OR for bleeding: No Stroke: No, [ ]  Minor, [ ]  Major  Discharge medications: Statin use:  Yes If No:   ASA use:  No  If No:   Plavix use:  No  Beta blocker use:  No  ACEI use:  No ARB use:  No CCB use:  No Coumadin use:  No

## 2016-07-31 NOTE — Progress Notes (Signed)
Patient sleeping and had 15 bts. V-tach then back to S.R.  R.N. Aware cont. To monitor patient and rhythm

## 2016-07-31 NOTE — Progress Notes (Signed)
Dr. Imogene Burnhen paged and made aware of that patient had 15 bts. V-Tach. See orders for CBC and BMP

## 2016-07-31 NOTE — Clinical Social Work Note (Signed)
PT has changed recommendation from SNF to HHPT. Patient has orders to discharge home today.  CSW signing off.  Charlynn CourtSarah Mical Brun, CSW [phone]

## 2016-07-31 NOTE — Care Management Note (Signed)
Case Management Note [name] [name] RN, BSN Unit 2W-Case Manager-- 2H coverage [phone]  Patient Details  Name: Alexandra Mays MRN: [id number] Date of Birth: 01/19/1951  Subjective/Objective:   Pt admitted s/p Aorto right femoral left common iliac artery bypass for abdominal aortic aneurysm, left common iliac endarterectomy, right common femoral endarterectomy- on 07/24/16                 Action/Plan: PTA pt lived at home- independent- CM to follow for d/c needs.  Received notice from Tiffany with [facility]- they have a  Pre-op referral for any Home Health needs at discharge- will [facility]   Expected Discharge Date:  07/31/16               Expected Discharge Plan:  Home w Home Health Services  In-House Referral:  Clinical Social Work  Discharge planning Services  CM Consult  Post Acute Care Choice:  Durable Medical Equipment, Home Health Choice offered to:  Patient  DME Arranged:  3-N-1, Walker rolling DME Agency:  [facility]  HH Arranged:  PT, OT HH Agency:  [facility]  Status of Service:  Completed, signed off  If discussed at Long Length of Stay Meetings, dates discussed:    Discharge Disposition: home with home health   Additional Comments:  07/31/16- 1530- [name] [name] RN, CM0- pt has progressed to being [facility] orders placed for HHPT/OT- spoke with pt at bedside- discussed pre-op referral to [facility] pt agreeable to this- pt also needs RW and 3n1 for home- orders placed- [facility] to be delivered to room prior to discharge- have also spoken with Tiffany with [facility] to notify of HH orders and discharge.   07/27/16- 1500- [name] RN, CM- [facility] on 07/26/16-  Per PT/OT recommendations for SNF- CSW has been consulted and following for SNF placement. Pt on cl. Liquid diet today.   07/25/16- 1020- [name]  RN CM- pt post op day 1- to keep NGT for today and PCA- remains NPO for now- CM to follow as pt progresses for d/c needs  [name], Shishir Krantz Hall, RN 07/31/2016, 3:33 PM

## 2016-07-31 NOTE — Progress Notes (Signed)
Physical Therapy Treatment Patient Details Name: Alexandra Mays MRN: [id number] DOB: [birth year] Today's Date: 07/31/2016    History of Present Illness Pt is a 66 yo female admitted for a aorto R femoral left common iliac bypass for AAA.  Pt underwent L common iliac endartarectomy and R common femoral endartarectomy. Pt with h/o low back pain, neck surgery and COPD.      PT Comments    Pt pleasant with abdominal pain and able to progress to walking without RW today. Pt with sats 91% on RA at rest with drop to 84% with simply sitting up with feet down in chair. Sats 90-92% on 2L with gait. HR 92-124 with activity. Pt educated for HEP, gait and oxygen requirement with activity. Will continue to follow and recommended daily mobility with nursing assist.    Follow Up Recommendations  Home health PT;Supervision - Intermittent     Equipment Recommendations       Recommendations for Other Services       Precautions / Restrictions Precautions Precautions: Fall Precaution Comments: monitor O2 sats during gait.  Restrictions Weight Bearing Restrictions: No    Mobility  Bed Mobility               General bed mobility comments: pt in chair on arrival  Transfers Overall transfer level: Modified independent Equipment used: None Transfers: Sit to/from Stand Sit to Stand: Modified independent (Device/Increase time)         General transfer comment: walked with min guard assist to [facility] and around bed to sit in chair  Ambulation/Gait Ambulation/Gait assistance: Supervision Ambulation Distance (Feet): 250 Feet Assistive device: None Gait Pattern/deviations: Step-through pattern;Decreased stride length   Gait velocity interpretation: Below normal speed for age/gender General Gait Details: Pt with slow steady gait and no need for RW. Pt with sats 92% on 2L with gait   Stairs            Wheelchair Mobility    Modified Rankin (Stroke Patients Only)       Balance  Overall balance assessment: No apparent balance deficits (not formally assessed)   Sitting balance-Leahy Scale: Good       Standing balance-Leahy Scale: Fair                              Cognition Arousal/Alertness: Awake/alert Behavior During Therapy: WFL for tasks assessed/performed Overall Cognitive Status: Within Functional Limits for tasks assessed                                        Exercises General Exercises - Lower Extremity Long Arc Quad: AROM;Both;Seated;15 reps Hip Flexion/Marching: AROM;15 reps;Both;Seated    General Comments        Pertinent Vitals/Pain Pain Assessment: Faces Pain Score: 6  Faces Pain Scale: Hurts little more Pain Location: abdomen Pain Descriptors / Indicators: Guarding;Sore Pain Intervention(s): Limited activity within patient's tolerance;Repositioned;Monitored during session;Patient requesting pain meds-RN notified    Home Living                      Prior Function            PT Goals (current goals can now be found in the care plan section) Acute Rehab PT Goals Patient Stated Goal: to get rid of the pain and to go home Progress towards PT goals: Progressing  toward goals    Frequency           PT Plan Current plan remains appropriate    Co-evaluation              AM-PAC PT "6 Clicks" Daily Activity  Outcome Measure  Difficulty turning over in bed (including adjusting bedclothes, sheets and blankets)?: None Difficulty moving from lying on back to sitting on the side of the bed? : A Little Difficulty sitting down on and standing up from a chair with arms (e.g., wheelchair, bedside commode, etc,.)?: None Help needed moving to and from a bed to chair (including a wheelchair)?: None Help needed walking in hospital room?: None Help needed climbing 3-5 steps with a railing? : A Little 6 Click Score: 22    End of Session Equipment Utilized During Treatment: Oxygen;Gait  belt Activity Tolerance: Patient tolerated treatment well Patient left: in chair;with call bell/phone within reach Nurse Communication: Mobility status       Time: 8469-62951033-1056 PT Time Calculation (min) (ACUTE ONLY): 23 min  Charges:  $Gait Training: 8-22 mins $Therapeutic Exercise: 8-22 mins                    G Codes:       Delaney MeigsMaija Tabor Neveyah Garzon, PT [phone]   Brunella Wileman B Zylee Marchiano 07/31/2016, 11:59 AM

## 2016-07-31 NOTE — Addendum Note (Signed)
Addendum  created 07/31/16 1821 by Gaynelle AduFitzgerald, Leeta Grimme, MD   Anesthesia Intra Blocks edited, Sign clinical note

## 2016-07-31 NOTE — Progress Notes (Signed)
Occupational Therapy Treatment Patient Details Name: Alexandra Mays MRN: [id number] DOB: [birth year] Today's Date: 07/31/2016    History of present illness Pt is a 66 yo female admitted for a aorto R femoral left common iliac bypass for AAA.  Pt underwent L common iliac endartarectomy and R common femoral endartarectomy. Pt with h/o low back pain, neck surgery and COPD.     OT comments  Pt completed sponge bathing and dressing with set up to moderate assistance. Decreased activity tolerance persists, but pt better and pt without nausea. Instrurcted pt in benefits of long handled bath sponge and reacher. Pt does not plan to wear socks, educated in safe footwear.Pt demonstrating ability to mobilize around her room without AD with min guard assist for safety, reinforced use of walker when she is home alone as per PT. Pt has an attentive family that can provide intermitted assistance and prefers to go home, changed d/c disposition to HHOT.  Follow Up Recommendations  Home health OT    Equipment Recommendations  Tub/shower seat    Recommendations for Other Services      Precautions / Restrictions Precautions Precautions: Fall Restrictions Weight Bearing Restrictions: No       Mobility Bed Mobility               General bed mobility comments: pt seated at EOB upon arrival  Transfers Overall transfer level: Needs assistance Equipment used: None Transfers: Sit to/from Stand Sit to Stand: Min guard         General transfer comment: walked with min guard assist to [facility] and around bed to sit in chair    Balance     Sitting balance-Leahy Scale: Good       Standing balance-Leahy Scale: Fair                             ADL either performed or assessed with clinical judgement   ADL Overall ADL's : Needs assistance/impaired     Grooming: Brushing hair;Wash/dry hands;Wash/dry face;Sitting;Set up   Upper Body Bathing: Minimal assistance;Sitting Upper Body  Bathing Details (indicate cue type and reason): assist for back Lower Body Bathing: Maximal assistance;Bed level   Upper Body Dressing : Set up;Sitting   Lower Body Dressing: Maximal assistance;Sit to/from stand   Toilet Transfer: Min guard;Ambulation;BSC   Toileting- Architect and Hygiene: Min guard;Sit to/from stand       Functional mobility during ADLs: Min guard (in room) General ADL Comments: pt with pain, denies nausea but poor appetite, agreeable to drinking ensure     Vision       Perception     Praxis      Cognition Arousal/Alertness: Awake/alert Behavior During Therapy: WFL for tasks assessed/performed Overall Cognitive Status: Within Functional Limits for tasks assessed                                          Exercises     Shoulder Instructions       General Comments      Pertinent Vitals/ Pain       Pain Assessment: Faces Faces Pain Scale: Hurts little more Pain Location: abdomen Pain Descriptors / Indicators: Grimacing;Guarding;Sore Pain Intervention(s): Monitored during session;Premedicated before session;Repositioned  Home Living  Prior Functioning/Environment              Frequency  Min 3X/week        Progress Toward Goals  OT Goals(current goals can now be found in the care plan section)  Progress towards OT goals: Progressing toward goals  Acute Rehab OT Goals Patient Stated Goal: to get rid of the pain and to go home OT Goal Formulation: With patient Time For Goal Achievement: 08/08/16 Potential to Achieve Goals: Good  Plan Discharge plan needs to be updated    Co-evaluation                 AM-PAC PT "6 Clicks" Daily Activity     Outcome Measure   Help from another person eating meals?: None Help from another person taking care of personal grooming?: A Little Help from another person toileting, which includes using toliet,  bedpan, or urinal?: A Little Help from another person bathing (including washing, rinsing, drying)?: A Lot Help from another person to put on and taking off regular upper body clothing?: A Little Help from another person to put on and taking off regular lower body clothing?: A Lot 6 Click Score: 17    End of Session Equipment Utilized During Treatment: Gait belt;Oxygen  OT Visit Diagnosis: Unsteadiness on feet (R26.81);Muscle weakness (generalized) (M62.81);Pain   Activity Tolerance Patient limited by fatigue   Patient Left in chair;with call bell/phone within reach   Nurse Communication Other (comment) (ok to give ensure)        Time: [id number] OT Time Calculation (min): 18 min  Charges: OT General Charges $OT Visit: 1 Procedure OT Treatments $Self Care/Home Management : 8-22 mins   Evern BioMayberry, Cornie Mccomber Lynn 07/31/2016, 10:15 AM  [phone]

## 2016-08-01 ENCOUNTER — Telehealth: Payer: Self-pay | Admitting: Vascular Surgery

## 2016-08-01 DIAGNOSIS — E785 Hyperlipidemia, unspecified: Secondary | ICD-10-CM | POA: Diagnosis not present

## 2016-08-01 DIAGNOSIS — M545 Low back pain: Secondary | ICD-10-CM | POA: Diagnosis not present

## 2016-08-01 DIAGNOSIS — R279 Unspecified lack of coordination: Secondary | ICD-10-CM | POA: Diagnosis not present

## 2016-08-01 DIAGNOSIS — J449 Chronic obstructive pulmonary disease, unspecified: Secondary | ICD-10-CM | POA: Diagnosis not present

## 2016-08-01 DIAGNOSIS — I714 Abdominal aortic aneurysm, without rupture: Secondary | ICD-10-CM | POA: Diagnosis not present

## 2016-08-01 DIAGNOSIS — Z48812 Encounter for surgical aftercare following surgery on the circulatory system: Secondary | ICD-10-CM | POA: Diagnosis not present

## 2016-08-01 NOTE — Telephone Encounter (Signed)
-----   Message from Sharee PimpleMarilyn K McChesney, RN sent at 07/31/2016  2:26 PM EDT ----- Regarding: 2-3 weeks   ----- Message ----- From: Dara Lordshyne, Samantha J, PA-C Sent: 07/31/2016   1:53 PM To: Vvs Charge Pool  S/p aortofemoral bypass. F/u with Dr. Darrick PennaFields in 2-3 weeks.  Thanks, Lelon MastSamantha

## 2016-08-01 NOTE — Telephone Encounter (Signed)
Pt appt 6/6 spoke to pt on home #, mailed lttr

## 2016-08-02 DIAGNOSIS — E785 Hyperlipidemia, unspecified: Secondary | ICD-10-CM | POA: Diagnosis not present

## 2016-08-02 DIAGNOSIS — J449 Chronic obstructive pulmonary disease, unspecified: Secondary | ICD-10-CM | POA: Diagnosis not present

## 2016-08-02 DIAGNOSIS — Z48812 Encounter for surgical aftercare following surgery on the circulatory system: Secondary | ICD-10-CM | POA: Diagnosis not present

## 2016-08-02 DIAGNOSIS — R279 Unspecified lack of coordination: Secondary | ICD-10-CM | POA: Diagnosis not present

## 2016-08-02 DIAGNOSIS — I714 Abdominal aortic aneurysm, without rupture: Secondary | ICD-10-CM | POA: Diagnosis not present

## 2016-08-02 DIAGNOSIS — M545 Low back pain: Secondary | ICD-10-CM | POA: Diagnosis not present

## 2016-08-03 DIAGNOSIS — I714 Abdominal aortic aneurysm, without rupture: Secondary | ICD-10-CM | POA: Diagnosis not present

## 2016-08-03 DIAGNOSIS — R279 Unspecified lack of coordination: Secondary | ICD-10-CM | POA: Diagnosis not present

## 2016-08-03 DIAGNOSIS — Z48812 Encounter for surgical aftercare following surgery on the circulatory system: Secondary | ICD-10-CM | POA: Diagnosis not present

## 2016-08-03 DIAGNOSIS — J449 Chronic obstructive pulmonary disease, unspecified: Secondary | ICD-10-CM | POA: Diagnosis not present

## 2016-08-03 DIAGNOSIS — M545 Low back pain: Secondary | ICD-10-CM | POA: Diagnosis not present

## 2016-08-03 DIAGNOSIS — E785 Hyperlipidemia, unspecified: Secondary | ICD-10-CM | POA: Diagnosis not present

## 2016-08-08 DIAGNOSIS — R279 Unspecified lack of coordination: Secondary | ICD-10-CM | POA: Diagnosis not present

## 2016-08-08 DIAGNOSIS — Z48812 Encounter for surgical aftercare following surgery on the circulatory system: Secondary | ICD-10-CM | POA: Diagnosis not present

## 2016-08-08 DIAGNOSIS — J449 Chronic obstructive pulmonary disease, unspecified: Secondary | ICD-10-CM | POA: Diagnosis not present

## 2016-08-08 DIAGNOSIS — I714 Abdominal aortic aneurysm, without rupture: Secondary | ICD-10-CM | POA: Diagnosis not present

## 2016-08-08 DIAGNOSIS — E785 Hyperlipidemia, unspecified: Secondary | ICD-10-CM | POA: Diagnosis not present

## 2016-08-08 DIAGNOSIS — M545 Low back pain: Secondary | ICD-10-CM | POA: Diagnosis not present

## 2016-08-09 DIAGNOSIS — J449 Chronic obstructive pulmonary disease, unspecified: Secondary | ICD-10-CM | POA: Diagnosis not present

## 2016-08-09 DIAGNOSIS — Z48812 Encounter for surgical aftercare following surgery on the circulatory system: Secondary | ICD-10-CM | POA: Diagnosis not present

## 2016-08-09 DIAGNOSIS — M545 Low back pain: Secondary | ICD-10-CM | POA: Diagnosis not present

## 2016-08-09 DIAGNOSIS — R279 Unspecified lack of coordination: Secondary | ICD-10-CM | POA: Diagnosis not present

## 2016-08-09 DIAGNOSIS — I714 Abdominal aortic aneurysm, without rupture: Secondary | ICD-10-CM | POA: Diagnosis not present

## 2016-08-09 DIAGNOSIS — E785 Hyperlipidemia, unspecified: Secondary | ICD-10-CM | POA: Diagnosis not present

## 2016-08-10 DIAGNOSIS — Z48812 Encounter for surgical aftercare following surgery on the circulatory system: Secondary | ICD-10-CM | POA: Diagnosis not present

## 2016-08-10 DIAGNOSIS — M545 Low back pain: Secondary | ICD-10-CM | POA: Diagnosis not present

## 2016-08-10 DIAGNOSIS — I714 Abdominal aortic aneurysm, without rupture: Secondary | ICD-10-CM | POA: Diagnosis not present

## 2016-08-10 DIAGNOSIS — J449 Chronic obstructive pulmonary disease, unspecified: Secondary | ICD-10-CM | POA: Diagnosis not present

## 2016-08-10 DIAGNOSIS — R279 Unspecified lack of coordination: Secondary | ICD-10-CM | POA: Diagnosis not present

## 2016-08-10 DIAGNOSIS — E785 Hyperlipidemia, unspecified: Secondary | ICD-10-CM | POA: Diagnosis not present

## 2016-08-11 DIAGNOSIS — R279 Unspecified lack of coordination: Secondary | ICD-10-CM | POA: Diagnosis not present

## 2016-08-11 DIAGNOSIS — Z48812 Encounter for surgical aftercare following surgery on the circulatory system: Secondary | ICD-10-CM | POA: Diagnosis not present

## 2016-08-11 DIAGNOSIS — E785 Hyperlipidemia, unspecified: Secondary | ICD-10-CM | POA: Diagnosis not present

## 2016-08-11 DIAGNOSIS — I714 Abdominal aortic aneurysm, without rupture: Secondary | ICD-10-CM | POA: Diagnosis not present

## 2016-08-11 DIAGNOSIS — J449 Chronic obstructive pulmonary disease, unspecified: Secondary | ICD-10-CM | POA: Diagnosis not present

## 2016-08-11 DIAGNOSIS — M545 Low back pain: Secondary | ICD-10-CM | POA: Diagnosis not present

## 2016-08-14 ENCOUNTER — Encounter: Payer: Self-pay | Admitting: Vascular Surgery

## 2016-08-15 ENCOUNTER — Encounter (HOSPITAL_COMMUNITY): Payer: Self-pay

## 2016-08-15 ENCOUNTER — Emergency Department (HOSPITAL_COMMUNITY)
Admission: EM | Admit: 2016-08-15 | Discharge: 2016-08-15 | Disposition: A | Discharge status: Home / Self Care | Payer: Medicare Other | Attending: Emergency Medicine | Admitting: Emergency Medicine

## 2016-08-15 ENCOUNTER — Emergency Department (HOSPITAL_COMMUNITY): Payer: Medicare Other

## 2016-08-15 DIAGNOSIS — I714 Abdominal aortic aneurysm, without rupture: Secondary | ICD-10-CM | POA: Diagnosis not present

## 2016-08-15 DIAGNOSIS — E785 Hyperlipidemia, unspecified: Secondary | ICD-10-CM | POA: Diagnosis not present

## 2016-08-15 DIAGNOSIS — Z8719 Personal history of other diseases of the digestive system: Secondary | ICD-10-CM | POA: Insufficient documentation

## 2016-08-15 DIAGNOSIS — R1084 Generalized abdominal pain: Secondary | ICD-10-CM | POA: Diagnosis not present

## 2016-08-15 DIAGNOSIS — F17210 Nicotine dependence, cigarettes, uncomplicated: Secondary | ICD-10-CM | POA: Diagnosis not present

## 2016-08-15 DIAGNOSIS — R279 Unspecified lack of coordination: Secondary | ICD-10-CM | POA: Diagnosis not present

## 2016-08-15 DIAGNOSIS — J449 Chronic obstructive pulmonary disease, unspecified: Secondary | ICD-10-CM | POA: Diagnosis not present

## 2016-08-15 DIAGNOSIS — M545 Low back pain: Secondary | ICD-10-CM | POA: Diagnosis not present

## 2016-08-15 DIAGNOSIS — R102 Pelvic and perineal pain: Secondary | ICD-10-CM

## 2016-08-15 DIAGNOSIS — R1030 Lower abdominal pain, unspecified: Secondary | ICD-10-CM | POA: Diagnosis not present

## 2016-08-15 DIAGNOSIS — Z9889 Other specified postprocedural states: Secondary | ICD-10-CM

## 2016-08-15 DIAGNOSIS — Z48812 Encounter for surgical aftercare following surgery on the circulatory system: Secondary | ICD-10-CM | POA: Diagnosis not present

## 2016-08-15 DIAGNOSIS — R109 Unspecified abdominal pain: Secondary | ICD-10-CM | POA: Diagnosis not present

## 2016-08-15 LAB — COMPREHENSIVE METABOLIC PANEL
ALBUMIN: 3.1 g/dL — AB (ref 3.5–5.0)
ALT: 10 U/L — ABNORMAL LOW (ref 14–54)
ANION GAP: 8 (ref 5–15)
AST: 18 U/L (ref 15–41)
Alkaline Phosphatase: 78 U/L (ref 38–126)
BUN: 6 mg/dL (ref 6–20)
CHLORIDE: 105 mmol/L (ref 101–111)
CO2: 25 mmol/L (ref 22–32)
Calcium: 9.1 mg/dL (ref 8.9–10.3)
Creatinine, Ser: 0.68 mg/dL (ref 0.44–1.00)
GFR calc Af Amer: >60 mL/min (ref >60)
GFR calc non Af Amer: >60 mL/min (ref >60)
GLUCOSE: 92 mg/dL (ref 65–99)
POTASSIUM: 3.7 mmol/L (ref 3.5–5.1)
SODIUM: 138 mmol/L (ref 135–145)
Total Bilirubin: 0.8 mg/dL (ref 0.3–1.2)
Total Protein: 6.1 g/dL — ABNORMAL LOW (ref 6.5–8.1)

## 2016-08-15 LAB — LIPASE, BLOOD: LIPASE: 19 U/L (ref 11–51)

## 2016-08-15 LAB — URINALYSIS, ROUTINE W REFLEX MICROSCOPIC
BACTERIA UA: NONE SEEN
Bilirubin Urine: NEGATIVE
Glucose, UA: NEGATIVE mg/dL
Hgb urine dipstick: NEGATIVE
KETONES UR: 5 mg/dL — AB
Nitrite: NEGATIVE
PROTEIN: NEGATIVE mg/dL
Specific Gravity, Urine: 1.005 (ref 1.005–1.030)
pH: 6 (ref 5.0–8.0)

## 2016-08-15 LAB — CBC
HEMATOCRIT: 38.8 % (ref 36.0–46.0)
HEMOGLOBIN: 12.6 g/dL (ref 12.0–15.0)
MCH: 29.9 pg (ref 26.0–34.0)
MCHC: 32.5 g/dL (ref 30.0–36.0)
MCV: 91.9 fL (ref 78.0–100.0)
Platelets: 290 10*3/uL (ref 150–400)
RBC: 4.22 MIL/uL (ref 3.87–5.11)
RDW: 15 % (ref 11.5–15.5)
WBC: 7.2 10*3/uL (ref 4.0–10.5)

## 2016-08-15 IMAGING — CT CT ABD-PELV W/ CM
2 of 5 series · 16 of 46 positions shown, 18 images · IV contrast (iopamidol)
Comparison: [DATE], ultrasound [DATE]

CLINICAL DATA: Abdominal pain after recent aneurysm repair

EXAM:
CT ABDOMEN AND PELVIS WITH CONTRAST
TECHNIQUE: Multidetector CT imaging of the abdomen and pelvis was performed
using the standard protocol following bolus administration of
intravenous contrast.
CONTRAST:  100mL [AM] IOPAMIDOL ([AM]) INJECTION 61%

[Series 3: a/p w/ 5mm · axial · 0.69mm/px · z∈[+892,+1242]mm · 13 of 80 slices shown, 15 images]
[im 5/80  soft-tissue]
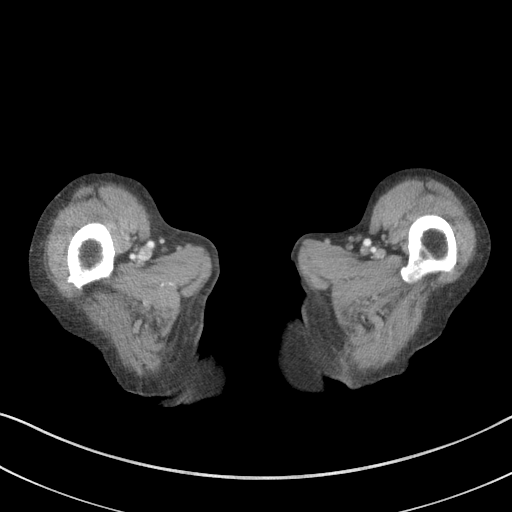
[im 5/80  bone]
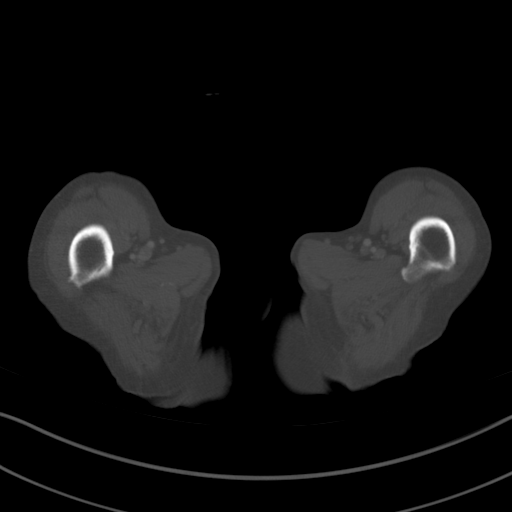
[im 13/80  soft-tissue]
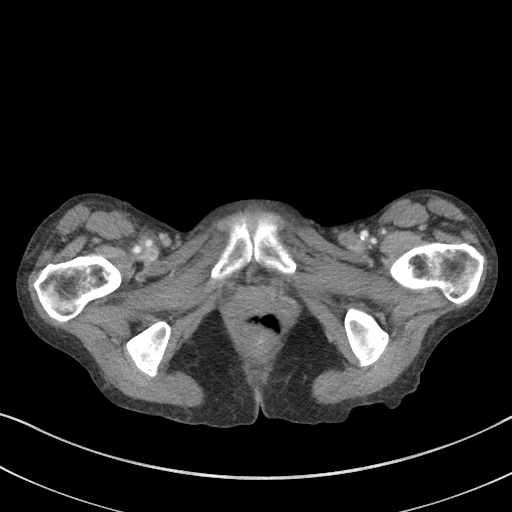
[im 17/80  soft-tissue]
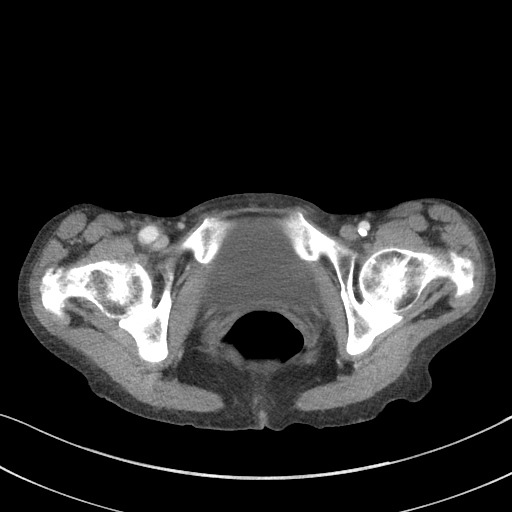
[im 21/80  soft-tissue]
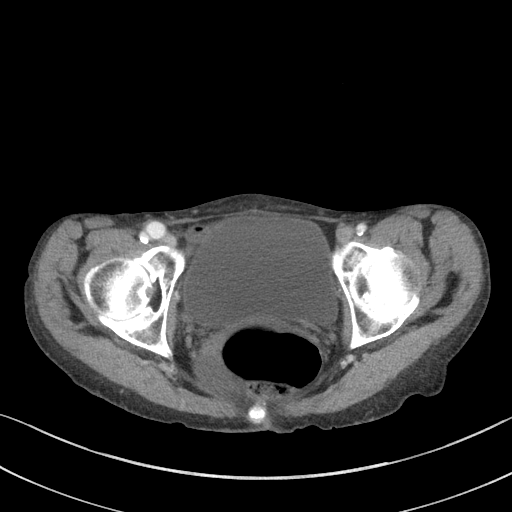
[im 30/80  soft-tissue]
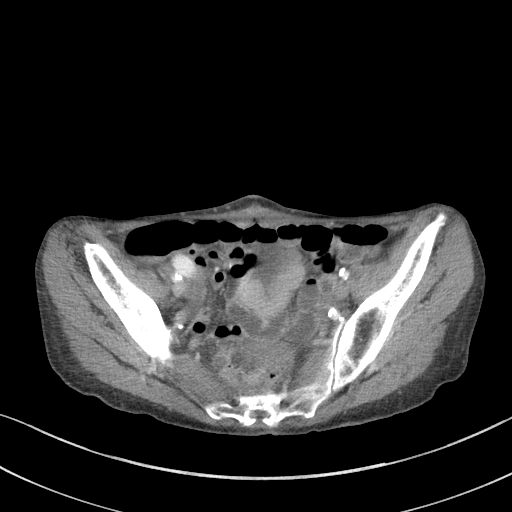
[im 34/80  soft-tissue]
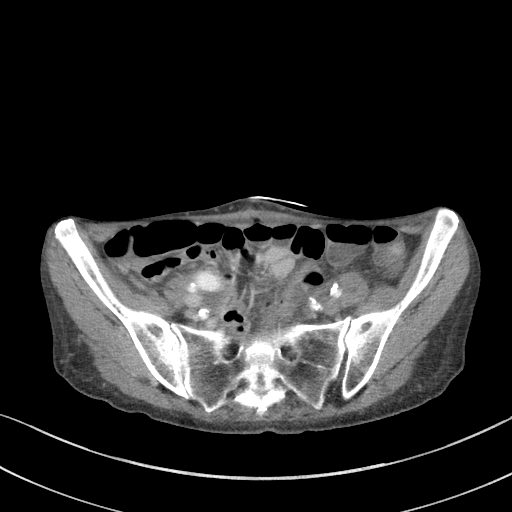
[im 42/80  soft-tissue]
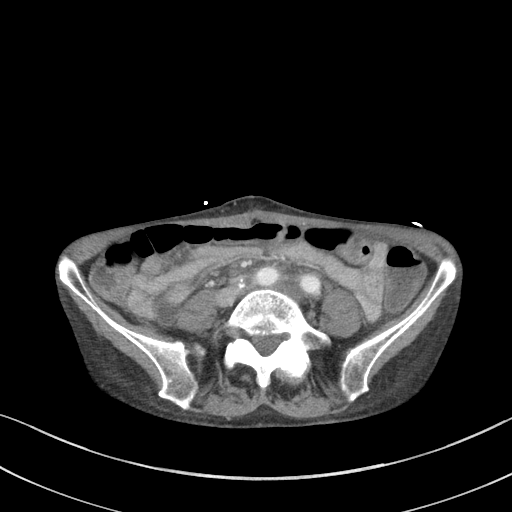
[im 46/80  soft-tissue]
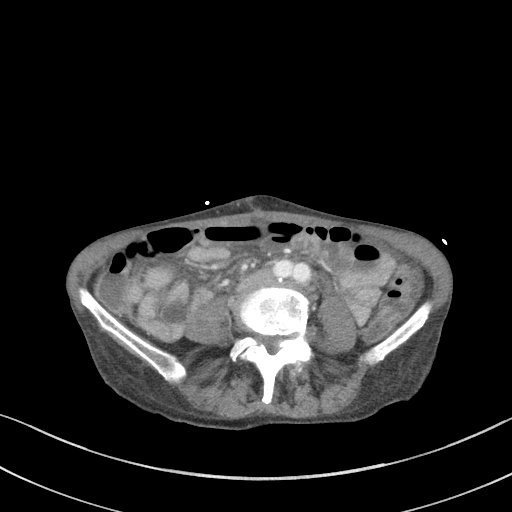
[im 50/80  soft-tissue]
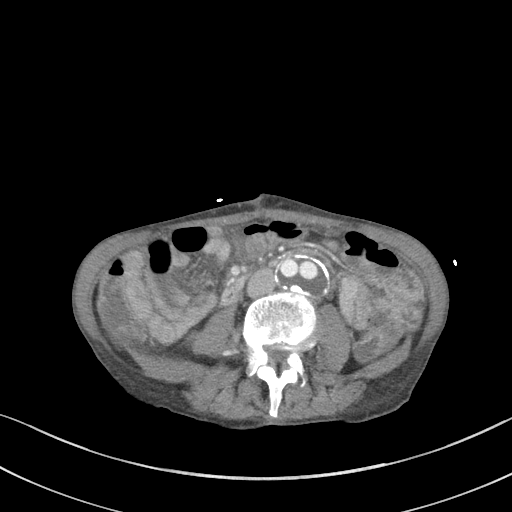
[im 50/80  bone]
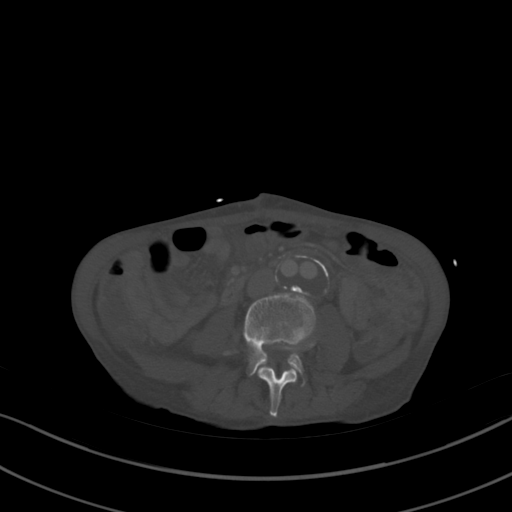
[im 59/80  soft-tissue]
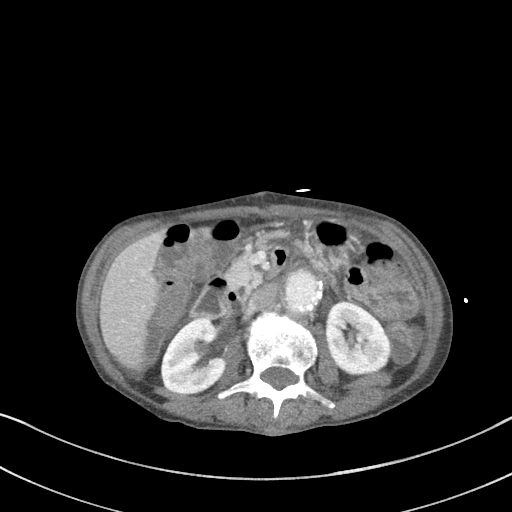
[im 63/80  soft-tissue]
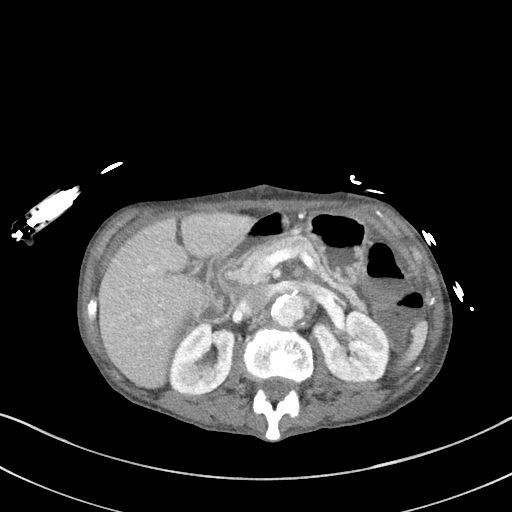
[im 67/80  soft-tissue]
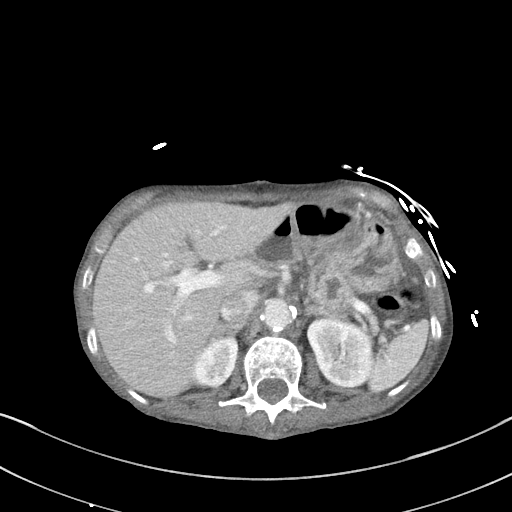
[im 75/80  soft-tissue]
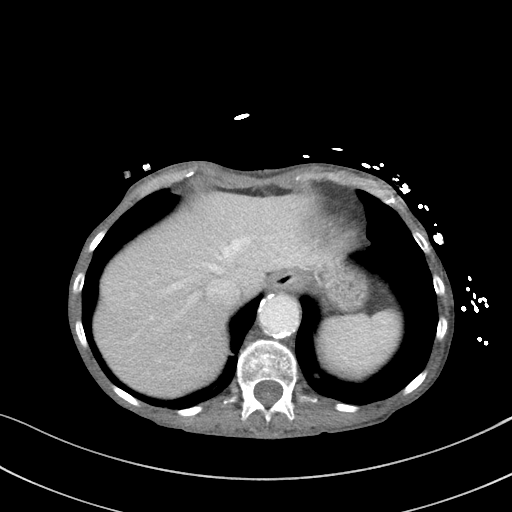

[Series 5: a/p w/ cor · coronal · 0.65mm/px · 3 of 106 slices shown]
[im 36/106  soft-tissue]
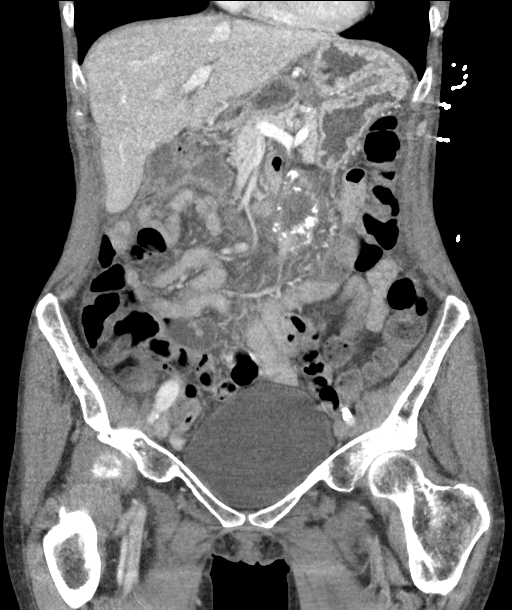
[im 47/106  soft-tissue]
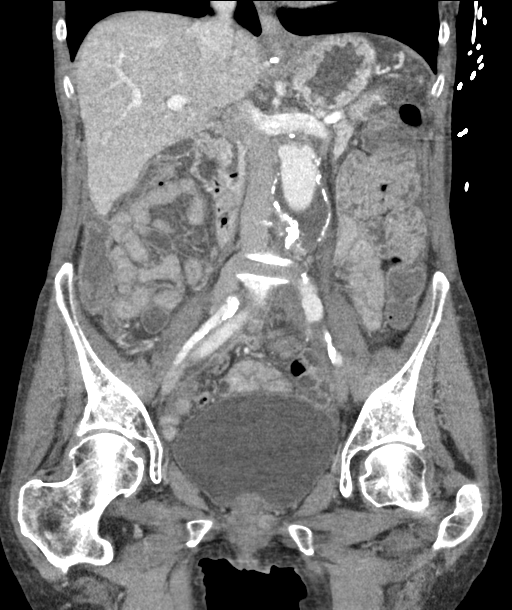
[im 59/106  soft-tissue]
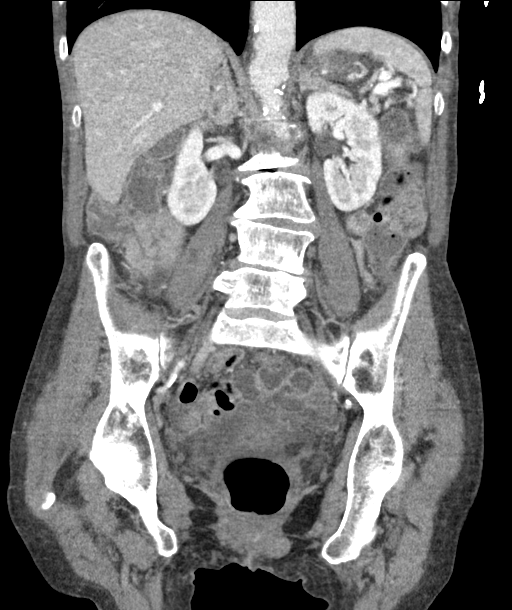

[16 of 46 positions shown; findings below may reference images not displayed]

FINDINGS: Lower chest: Lung bases demonstrate no acute consolidation or
pleural effusion. The heart does not appear enlarged.

Hepatobiliary: No focal hepatic abnormality. No calcified gallstones
or biliary dilatation.

Pancreas: Unremarkable. No pancreatic ductal dilatation or
surrounding inflammatory changes.

Spleen: Normal in size without focal abnormality.

Adrenals/Urinary Tract: 9 mm hypodense right adrenal gland nodule
unchanged. Left adrenal gland within normal limits. Kidneys show no
focal abnormality or hydronephrosis. The bladder is normal.

Stomach/Bowel: The stomach is nonenlarged. No dilated small bowel.
No colon wall thickening. Normal appendix.

Vascular/Lymphatic: Extensive atherosclerotic vascular disease of
the aorta. Previously noted penetrating aortic ulcers of the distal
descending thoracic aorta are better appreciated on the prior CTA.
Patient is status post repair of a large infrarenal abdominal aortic
aneurysm. Aortoiliac graft has been placed, right limb of the graft
is to the right common femoral artery and left limb is to the left
common iliac artery. Interval decrease in size of the aneurysm sac
surrounding the graft, now measuring 3.8 x 2.8 cm, compared with
cm previously. No retroperitoneal hematoma. No significantly
enlarged lymph nodes.

Reproductive: Uterus and bilateral adnexa are unremarkable.

Other: Small free fluid in the pelvis.  No free air.

Musculoskeletal: Degenerative changes of the spine. No acute or
suspicious bone lesion.
IMPRESSION: 1. Interval surgical repair of previously noted large infrarenal
abdominal aortic aneurysm with decreased size of the aneurysm as
described above. No evidence for retroperitoneal hematoma.
2. No evidence for bowel obstruction or colon wall thickening.
Normal appendix.
3. Small amount of free fluid in the pelvis.
4. Stable small hypodense nodule in the right adrenal gland.

## 2016-08-15 MED ORDER — IOPAMIDOL (ISOVUE-300) INJECTION 61%
INTRAVENOUS | Status: AC
  Administered 2016-08-15: 100 mL
  Filled 2016-08-15: qty 100

## 2016-08-15 MED ORDER — OXYCODONE-ACETAMINOPHEN 5-325 MG PO TABS
1.0000 | ORAL_TABLET | Freq: Once | ORAL | Status: AC
  Administered 2016-08-15: 1 via ORAL
  Filled 2016-08-15: qty 1

## 2016-08-15 NOTE — ED Provider Notes (Signed)
[facility]-EMERGENCY DEPT Provider Note   CSN: [id number] Arrival date & time: 08/15/16  1349     History   Chief Complaint Chief Complaint  Patient presents with  . Abdominal Pain    HPI Alexandra Mays is a 66 y.o. female who recently had a AAA repair by Dr. Darrick Penna on 07/24/2016 who presents today with 3 days of suprapubic abdominal pain and low back pain. The patient notes that the pain onset about 3 days ago along with constipation. The pain is a "crampy, bubbly, soreness" that occurs on/off with no precipitating factors. The pain is slightly relieved if she sits in a recliner with a pillow pressed against her abdomen. At its worse it is rated a 7/10, currently 2/10. The patient had associated constipation was relieved by taking MiraLAX but now has diarrhea. She has had decreased PO intake the past 2 days. She was initially seen at urgent care after her home health nurse told her to be seen, where she was referred to the emergency department. She denies any fever, chills, chest pain, shortness of breath, vomiting, nausea, loss of bowel or bladder function, saddle anesthesia, hematuria, increased urinary frequency, urinary changes.  HPI  Past Medical History:  Diagnosis Date  . AAA (abdominal aortic aneurysm) (HCC)    AAA  . COPD (chronic obstructive pulmonary disease) (HCC)   . Dyspnea    with exertion  . Pure hyperglyceridemia     Patient Active Problem List   Diagnosis Date Noted  . S/P aortic aneurysm repair 07/24/2016  . AAA (abdominal aortic aneurysm) (HCC) 07/24/2016  . AAA (abdominal aortic aneurysm) without rupture (HCC) 04/21/2015  . Mild cognitive impairment with memory loss 02/17/2015  . Depressive disorder 02/17/2015    Past Surgical History:  Procedure Laterality Date  . ABDOMINAL AORTIC ANEURYSM REPAIR N/A 07/24/2016   Procedure: ABDOMINAL AORTIC ANEURYSM  REPAIR;  Surgeon: Sherren Kerns, MD;  Location: [facility] OR;  Service: Vascular;  Laterality: N/A;  . cardiac  stenting  2007  . ENDARTERECTOMY Left 07/24/2016   Procedure: AORTA TO LEFT ILIAC ENDARTERECTOMY;  Surgeon: Sherren Kerns, MD;  Location: [facility] OR;  Service: Vascular;  Laterality: Left;  . ENDARTERECTOMY FEMORAL Right 07/24/2016   Procedure: AORTA TO RIGHT FEMORAL ENDARTERECTOMY;  Surgeon: Sherren Kerns, MD;  Location: [facility] OR;  Service: Vascular;  Laterality: Right;  . TOE SURGERY Left    3 rd toe Hammer toe    OB History    No data available       Home Medications    Prior to Admission medications   Medication Sig Start Date End Date Taking? Authorizing Provider  albuterol (PROVENTIL HFA;VENTOLIN HFA) 108 (90 Base) MCG/ACT inhaler Inhale 2 puffs into the lungs every 6 (six) hours as needed for wheezing or shortness of breath.   Yes [provider]  Fluticasone Furoate-Vilanterol (BREO ELLIPTA) 100-25 MCG/INH AEPB Inhale 1 puff into the lungs daily.    Yes [provider]  ibuprofen (ADVIL,MOTRIN) 200 MG tablet Take 800 mg by mouth every 6 (six) hours as needed for mild pain.   Yes [provider]  naproxen sodium (ANAPROX) 220 MG tablet Take 220 mg by mouth as needed (pain).   Yes [provider]  oxyCODONE-acetaminophen (PERCOCET/ROXICET) 5-325 MG tablet Take 1 tablet by mouth every 6 (six) hours as needed for moderate pain. 07/31/16  Yes Rhyne, Samantha J, PA-C  polyethylene glycol (MIRALAX / GLYCOLAX) packet Take 17 g by mouth daily. 06/23/16  Yes Petit,  Janyth PupaNicholas, MD  simvastatin (ZOCOR) 20 MG tablet Take 20 mg by mouth every morning.    Yes [provider]  Tiotropium Bromide Monohydrate (SPIRIVA RESPIMAT) 2.5 MCG/ACT AERS Inhale 2 puffs into the lungs daily.   Yes [provider]    Family History Family History  Problem Relation Age of Onset  . Stroke Mother     Social History Social History  Substance Use Topics  . Smoking status: Current Every Day Smoker    Packs/day: 1.00    Years: 50.00    Types: Cigarettes  .  Smokeless tobacco: Never Used     Comment: A little less than 1 pk per day  . Alcohol use No     Allergies   No known allergies   Review of Systems Review of Systems  All other systems reviewed and are negative.    Physical Exam Updated Vital Signs BP (!) 143/72   Pulse 72   Temp 97.9 F (36.6 C) (Oral)   Resp (!) 21   Ht 5\' 3"  (1.6 m)   Wt 47.2 kg (104 lb)   SpO2 98%   BMI 18.42 kg/m   Physical Exam  Constitutional: She appears well-developed. She appears cachectic.  HENT:  Head: Normocephalic and atraumatic.  Mouth/Throat: Oropharynx is clear and moist.  Eyes: Conjunctivae are normal. Pupils are equal, round, and reactive to light.  Neck: Neck supple. Carotid bruit is not present.  Cardiovascular: Normal rate, regular rhythm and intact distal pulses.   No murmur heard. Pulses:      Radial pulses are 2+ on the right side, and 2+ on the left side.       Femoral pulses are 2+ on the right side, and 2+ on the left side.      Dorsalis pedis pulses are 2+ on the right side, and 2+ on the left side.  Pulmonary/Chest: Effort normal. She has wheezes (throughout). She exhibits no tenderness.  Abdominal: Soft. Bowel sounds are normal. She exhibits no shifting dullness, no distension and no abdominal bruit. There is tenderness in the suprapubic area. There is no rebound, no guarding and no CVA tenderness.  The patient is very thin, so it is hard to discern between a pulsatile mass in her normal heartbeat.  Musculoskeletal: She exhibits no edema.  Lymphadenopathy:    She has no cervical adenopathy.  Neurological: She is alert.  Skin: No rash noted. She is not diaphoretic.  Psychiatric: She has a normal mood and affect.  Nursing note and vitals reviewed.    ED Treatments / Results  Labs (all labs ordered are listed, but only abnormal results are displayed) Labs Reviewed  COMPREHENSIVE METABOLIC PANEL - Abnormal; Notable for the following:       Result Value   Total  Protein 6.1 (*)    Albumin 3.1 (*)    ALT 10 (*)    All other components within normal limits  URINALYSIS, ROUTINE W REFLEX MICROSCOPIC - Abnormal; Notable for the following:    Color, Urine STRAW (*)    Ketones, ur 5 (*)    Leukocytes, UA TRACE (*)    Squamous Epithelial / LPF 0-5 (*)    All other components within normal limits  LIPASE, BLOOD  CBC    EKG  EKG Interpretation  Date/Time:  Tuesday August 15 2016 14:00:21 EDT Ventricular Rate:  78 PR Interval:    QRS Duration: 77 QT Interval:  406 QTC Calculation: 409 R Axis:   84 Text Interpretation:  Sinus rhythm Supraventricular bigeminy Anterior infarct, old Poor data quality, interpretation may be adversely affected Confirmed by RAY MD, Duwayne Heck [phone]) on 08/15/2016 2:26:12 PM       Radiology Ct Abdomen Pelvis W Contrast  Result Date: 08/15/2016 CLINICAL DATA:  Abdominal pain after recent aneurysm repair EXAM: CT ABDOMEN AND PELVIS WITH CONTRAST TECHNIQUE: Multidetector CT imaging of the abdomen and pelvis was performed using the standard protocol following bolus administration of intravenous contrast. CONTRAST:  ISOVUE-300 IOPAMIDOL (ISOVUE-300) INJECTION 61% COMPARISON:  06/23/2016, ultrasound 06/23/2016 FINDINGS: Lower chest: Lung bases demonstrate no acute consolidation or pleural effusion. The heart does not appear enlarged. Hepatobiliary: No focal hepatic abnormality. No calcified gallstones or biliary dilatation. Pancreas: Unremarkable. No pancreatic ductal dilatation or surrounding inflammatory changes. Spleen: Normal in size without focal abnormality. Adrenals/Urinary Tract: 9 mm hypodense right adrenal gland nodule unchanged. Left adrenal gland within normal limits. Kidneys show no focal abnormality or hydronephrosis. The bladder is normal. Stomach/Bowel: The stomach is nonenlarged. No dilated small bowel. No colon wall thickening. Normal appendix. Vascular/Lymphatic: Extensive atherosclerotic vascular disease of the  aorta. Previously noted penetrating aortic ulcers of the distal descending thoracic aorta are better appreciated on the prior CTA. Patient is status post repair of a large infrarenal abdominal aortic aneurysm. Aortoiliac graft has been placed, right limb of the graft is to the right common femoral artery and left limb is to the left common iliac artery. Interval decrease in size of the aneurysm sac surrounding the graft, now measuring 3.8 x 2.8 cm, compared with 5.4 cm previously. No retroperitoneal hematoma. No significantly enlarged lymph nodes. Reproductive: Uterus and bilateral adnexa are unremarkable. Other: Small free fluid in the pelvis.  No free air. Musculoskeletal: Degenerative changes of the spine. No acute or suspicious bone lesion. IMPRESSION: 1. Interval surgical repair of previously noted large infrarenal abdominal aortic aneurysm with decreased size of the aneurysm as described above. No evidence for retroperitoneal hematoma. 2. No evidence for bowel obstruction or colon wall thickening. Normal appendix. 3. Small amount of free fluid in the pelvis. 4. Stable small hypodense nodule in the right adrenal gland. Electronically Signed   By: Jasmine Pang M.D.   On: 08/15/2016 16:45    Procedures Procedures (including critical care time)  Medications Ordered in ED Medications  oxyCODONE-acetaminophen (PERCOCET/ROXICET) 5-325 MG per tablet 1 tablet (not administered)  iopamidol (ISOVUE-300) 61 % injection (100 mLs  Contrast Given 08/15/16 1613)     Initial Impression / Assessment and Plan / ED Course  I have reviewed the triage vital signs and the nursing notes.  Pertinent labs & imaging results that were available during my care of the patient were reviewed by me and considered in my medical decision making (see chart for details).     This is a 66 y.o. female who recently had a AAA repair by Dr. Darrick Penna on 07/24/2016 who presents today with 3 days of suprapubic abdominal pain and low back  pain. No red flag symptoms for cauda equina syndrome. The patient has suprapuic abdominal pain on exam. Good femoral pulses b/l. Undiscernable pulsatile mass due to patients thin frame.   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   CBC, CMP, Lipase, and UA ordered. CT abdomen and pelvis with contrast ordered. Vascular surgery consulted by Dr. Rosalia Hammers who spoke to Dr. Edilia Bo. Discussed if CT negative patient is able to go home.   Ua negative for UTI. Lipase wnl. CMP shows low levels of protein ,albumin, and ALT but otherwise  unremarkable. CBC unremarkable.   CT showed decrease size of aneurysm after surgery and did not show evidence for retroperitoneal hematoma. There was a small amount of free fluid in the pelvis and hypodense nodule in the right adrenal glad.  Patient to be discharged home with close follow up by Dr. Darrick Penna and vascular surgery tomorrow. Pain medication given prior to discharge. Return precautions given. Patient in understanding and agreeance with plan.   This patient was seen and evaluated in conjunction with Dr. Rosalia Hammers.    Final Clinical Impressions(s) / ED Diagnoses   Final diagnoses:  Suprapubic abdominal pain  History of AAA (abdominal aortic aneurysm) repair    New Prescriptions New Prescriptions   No medications on file     Princella Pellegrini 08/15/16 1712    Margarita Grizzle, MD 08/20/16 1622

## 2016-08-15 NOTE — ED Triage Notes (Signed)
[facility] pain x 3 days. Pt admits to 3 days of constipation, then taking laxative and now having 3 days of diarrhea. PT reports pain is below umbilicus and radiates down squeezing in nature. PT has hx of AAA repair 3 weeks ago.    #22 LAC NS KVO  97% RA 166/80 Hr 76 rr 16

## 2016-08-15 NOTE — Consult Note (Signed)
Hospital Consult  Reason for Consult:  Abdominal pain  Requesting Physician:  ER MRN #:  [id number]  History of Present Illness: This is a 66 y.o. female who underwent aorto right femoral left common iliac artery bypass for AAA and left common iliac endarterectomy and right common femoral endarterectomy on 07/24/16.    Post operatively, she did have uncontrolled pain and required a PCA.  This was eventually weaned off.  She has not taken much pain medication at home except when she had HHPT, which she has been doing well with.    In the hospital, it took a few days for her GI function to return.  She states that about a week and half ago, she noticed that she was constipated and took an extra dose of Miralax, which helped.  She states that this past Friday, she was sitting in her recliner and starting having an ache in her abdomen.  By Saturday and Sunday this had worsened.  She states that this was similar to what she had about a week and a half ago.  She states that she has not had a solid BM and it has just been liquid since last week.    She states that she had one pain pill left and took it, but this did not help with her pain.  She states that this is different from her normal back pain.  Her HHRN felt that she might be impacted and was sent to the urgent care for an xray.  They did not feel comfortable treating her and called an ambulance and she is in the ED for further workup.    She denies any blood in her stool or urine.  She denies any fever or chills.    A CT scan has been ordered.    Post operatively, she did have thrombocytopenia as her platelet count had dropped as low as 36k, but did rebound.  Her HIT panel was negative.    Past Medical History:  Diagnosis Date  . AAA (abdominal aortic aneurysm) (HCC)    AAA  . COPD (chronic obstructive pulmonary disease) (HCC)   . Dyspnea    with exertion  . Pure hyperglyceridemia     Past Surgical History:  Procedure Laterality Date    . ABDOMINAL AORTIC ANEURYSM REPAIR N/A 07/24/2016   Procedure: ABDOMINAL AORTIC ANEURYSM  REPAIR;  Surgeon: Sherren KernsFields, Charles E, MD;  [facility];  Service: Vascular;  Laterality: N/A;  . cardiac stenting  2007  . ENDARTERECTOMY Left 07/24/2016   Procedure: AORTA TO LEFT ILIAC ENDARTERECTOMY;  Surgeon: Sherren KernsFields, Charles E, MD;  [facility];  Service: Vascular;  Laterality: Left;  . ENDARTERECTOMY FEMORAL Right 07/24/2016   Procedure: AORTA TO RIGHT FEMORAL ENDARTERECTOMY;  Surgeon: Sherren KernsFields, Charles E, MD;  [facility];  Service: Vascular;  Laterality: Right;  . TOE SURGERY Left    3 rd toe Hammer toe    Allergies  Allergen Reactions  . No Known Allergies     Prior to Admission medications   Medication Sig Start Date End Date Taking? Authorizing Provider  albuterol (PROVENTIL HFA;VENTOLIN HFA) 108 (90 Base) MCG/ACT inhaler Inhale 2 puffs into the lungs every 6 (six) hours as needed for wheezing or shortness of breath.   Yes [provider]  Fluticasone Furoate-Vilanterol (BREO ELLIPTA) 100-25 MCG/INH AEPB Inhale 1 puff into the lungs daily.    Yes [provider]  ibuprofen (ADVIL,MOTRIN) 200 MG tablet Take 800 mg by mouth every 6 (six)  hours as needed for mild pain.   Yes [provider]  naproxen sodium (ANAPROX) 220 MG tablet Take 220 mg by mouth as needed (pain).   Yes [provider]  oxyCODONE-acetaminophen (PERCOCET/ROXICET) 5-325 MG tablet Take 1 tablet by mouth every 6 (six) hours as needed for moderate pain. 07/31/16  Yes Rhyne, Samantha J, PA-C  polyethylene glycol (MIRALAX / GLYCOLAX) packet Take 17 g by mouth daily. 06/23/16  Yes Forest Becker, MD  simvastatin (ZOCOR) 20 MG tablet Take 20 mg by mouth every morning.    Yes [provider]  Tiotropium Bromide Monohydrate (SPIRIVA RESPIMAT) 2.5 MCG/ACT AERS Inhale 2 puffs into the lungs daily.   Yes [provider]    Social History   Social History  . Marital status:  Widowed    Spouse name: N/A  . Number of children: N/A  . Years of education: N/A   Occupational History  . Not on file.   Social History Main Topics  . Smoking status: Current Every Day Smoker    Packs/day: 1.00    Years: 50.00    Types: Cigarettes  . Smokeless tobacco: Never Used     Comment: A little less than 1 pk per day  . Alcohol use No  . Drug use: No  . Sexual activity: Not on file   Other Topics Concern  . Not on file   Social History Narrative  . No narrative on file     Family History  Problem Relation Age of Onset  . Stroke Mother     ROS: [x]  Positive   [ ]  Negative   [ ]  All sytems reviewed and are negative See HPI   Physical Examination  Vitals:   08/15/16 1405 08/15/16 1545  BP: (!) 158/59 (!) 143/72  Pulse: 71 72  Resp: 19 (!) 21  Temp: 97.9 F (36.6 C)    Body mass index is 18.42 kg/m.  General:  WDWN in NAD Gait: Not observed HENT: WNL, normocephalic Pulmonary: normal non-labored breathing, without Rales, rhonchi,  wheezing Cardiac: regular, without  Murmurs, rubs or gallops; Abdomen:  Soft, non distended with mild tenderness to palpation in the RLQ Skin: laparotomy incision and right groin incisions have healed nicely. Vascular Exam/Pulses: Easily palpable DP pulses bilaterally  Extremities: warm and well perfused feet.    Neurologic: A&O X 3;  No focal weakness or paresthesias are detected; speech is fluent/normal Psychiatric:  The pt has Normal affect.   CBC    Component Value Date/Time   WBC 7.2 08/15/2016 1419   RBC 4.22 08/15/2016 1419   HGB 12.6 08/15/2016 1419   HCT 38.8 08/15/2016 1419   PLT 290 08/15/2016 1419   MCV 91.9 08/15/2016 1419   MCH 29.9 08/15/2016 1419   MCHC 32.5 08/15/2016 1419   RDW 15.0 08/15/2016 1419    BMET    Component Value Date/Time   NA 138 08/15/2016 1419   K 3.7 08/15/2016 1419   CL 105 08/15/2016 1419   CO2 25 08/15/2016 1419   GLUCOSE 92 08/15/2016 1419   BUN 6 08/15/2016 1419     CREATININE 0.68 08/15/2016 1419   CALCIUM 9.1 08/15/2016 1419   GFRNONAA >60 08/15/2016 1419   GFRAA >60 08/15/2016 1419    COAGS: Lab Results  Component Value Date   INR 1.48 07/24/2016   INR 1.06 07/21/2016   Non-Invasive Vascular Imaging:   CT scan is pending  ASSESSMENT/PLAN: This is a 66 y.o. female who is s/p Aorto  right femoral left common iliac artery bypass for abdominal aortic aneurysm, left common iliac endarterectomy, right common femoral endarterectomy by Dr. Darrick Penna on 07/24/16 who presents today with abdominal pain.  -pt in NAD at this time.  She denies and blood in her stool or urine and denies and fever or chills.   -she has not had a solid BM in several days and has taken Miralax and has only had liquid stools since her pain started last Friday.  She could be impacted.  CT scan is pending -her acute surgical blood loss has improved since her discharge and she does not have leukocytosis. -she does have trace leukocytes and 6-30 WBC's on u/a, but negative nitrites -her thrombocytopenia has resolved. -Dr. Edilia Bo will be in to see pt   Doreatha Massed, PA-C [facility] [phone]  VASCULAR SURGERY ASSESSMENT & PLAN:   I have spoken with the patient and examined her. I agree with the assessment as documented above. Her CT of the abdomen and pelvis is unremarkable. This patient had been having some back pain related to lumbar disc disease prior to surgery and appears to be having similar pain. She does have some abdominal pain but has no evidence of a bowel obstruction and her CT scan is unremarkable. She was discharged from the emergency department before I could discuss her CT scan results with her. She will keep her regularly scheduled follow up appointment with Dr. Fabienne Bruns.  Waverly Ferrari, MD, FACS Bee[facility] [phone] Office: [phone]

## 2016-08-15 NOTE — ED Notes (Signed)
Patient transported to CT 

## 2016-08-15 NOTE — Discharge Instructions (Signed)
Your CT of your abdomen was negative for concerning changes regarding your recent surgery. Your urine did not show any signs of infection. Please follow up with Dr. Darrick PennaFields tomorrow for your scheduled appointment . Return to the emergency department for symptoms discussed. You can return for worsening symptoms.

## 2016-08-16 ENCOUNTER — Ambulatory Visit (INDEPENDENT_AMBULATORY_CARE_PROVIDER_SITE_OTHER): Payer: Self-pay | Admitting: Vascular Surgery

## 2016-08-16 ENCOUNTER — Encounter: Payer: Self-pay | Admitting: Vascular Surgery

## 2016-08-16 ENCOUNTER — Telehealth: Payer: Self-pay | Admitting: Vascular Surgery

## 2016-08-16 VITALS — BP 135/76 | HR 94 | Temp 97.4°F | Resp 16 | Ht 63.0 in | Wt 85.0 lb

## 2016-08-16 DIAGNOSIS — E785 Hyperlipidemia, unspecified: Secondary | ICD-10-CM | POA: Diagnosis not present

## 2016-08-16 DIAGNOSIS — I714 Abdominal aortic aneurysm, without rupture, unspecified: Secondary | ICD-10-CM

## 2016-08-16 DIAGNOSIS — R911 Solitary pulmonary nodule: Secondary | ICD-10-CM

## 2016-08-16 DIAGNOSIS — J449 Chronic obstructive pulmonary disease, unspecified: Secondary | ICD-10-CM | POA: Diagnosis not present

## 2016-08-16 DIAGNOSIS — I739 Peripheral vascular disease, unspecified: Secondary | ICD-10-CM

## 2016-08-16 DIAGNOSIS — M545 Low back pain: Secondary | ICD-10-CM | POA: Diagnosis not present

## 2016-08-16 DIAGNOSIS — R279 Unspecified lack of coordination: Secondary | ICD-10-CM | POA: Diagnosis not present

## 2016-08-16 DIAGNOSIS — Z48812 Encounter for surgical aftercare following surgery on the circulatory system: Secondary | ICD-10-CM | POA: Diagnosis not present

## 2016-08-16 MED ORDER — OXYCODONE-ACETAMINOPHEN 5-325 MG PO TABS: 1.0000 | ORAL_TABLET | Freq: Four times a day (QID) | ORAL | 0 refills | Status: DC | PRN

## 2016-08-16 NOTE — Telephone Encounter (Signed)
-----   Message from Sharee PimpleMarilyn K McChesney, RN sent at 08/16/2016  1:00 PM EDT ----- Regarding: FYI    ----- Message ----- From: Chuck Hintickson, Christopher S, MD Sent: 08/15/2016   5:59 PM To: [facility] Charge Pool Subject: charge                                         This patient came to the emergency department because of abdominal pain. Her exam was unremarkable and CT scan of the abdomen and pelvis was unremarkable. She was discharged. She will keep her regularly scheduled follow up appointment. Thank you.

## 2016-08-16 NOTE — Progress Notes (Signed)
Patient is a 66 year old female who returns today for postoperative follow-up. She recently had repair of abdominal aortic aneurysm with aorto to right common femoral and left common iliac bypass. She was doing well until a few days ago when she ran out of her narcotic pain medication and began have a lot of lower abdominal pain. She was asked seen in the emergency room last evening for this. CT scan showed no perigraft fluid collections are widely patent bypass graft and really no significant findings.  She is taking some extra strength Tylenol and Aleve but this is not really completely resolve the pain. She mainly wants to take medicine in the evening so that she can sleep better.  Physical exam:  Vitals:   08/16/16 1238  BP: 135/76  Pulse: 94  Resp: 16  Temp: 97.4 F (36.3 C)  SpO2: 98%  Weight: 85 lb (38.6 kg)  Height: 5\' 3"  (1.6 m)    Abdomen: Soft nontender normal bowel sounds well-healed midline laparotomy incision  Extremities: 2+ dorsalis pedis pulses bilaterally well-healed right groin incision 2+ femoral pulses bilaterally  Data: I reviewed the patient's CT scan of the abdomen and pelvis which again shows a graft is widely patent renal arteries are patent  Assessment: Doing well status post abdominal aortic aneurysm repair still with some lower abdominal pain which should resolve in the next few weeks.  Plan: The patient was given an additional prescription for 20 Percocet tablets. She should not need any further refills. She will follow-up with me in 6 months time with bilateral ABIs.  Fabienne Brunsharles Mccrae Speciale, MD [facility] [place] Office: [phone] Pager: [phone]

## 2016-08-17 NOTE — Addendum Note (Signed)
Addended by: Burton ApleyPETTY, Marianna Cid A on: 08/17/2016 04:52 PM   Modules accepted: Orders

## 2016-08-18 DIAGNOSIS — R279 Unspecified lack of coordination: Secondary | ICD-10-CM | POA: Diagnosis not present

## 2016-08-18 DIAGNOSIS — I714 Abdominal aortic aneurysm, without rupture: Secondary | ICD-10-CM | POA: Diagnosis not present

## 2016-08-18 DIAGNOSIS — J449 Chronic obstructive pulmonary disease, unspecified: Secondary | ICD-10-CM | POA: Diagnosis not present

## 2016-08-18 DIAGNOSIS — E785 Hyperlipidemia, unspecified: Secondary | ICD-10-CM | POA: Diagnosis not present

## 2016-08-18 DIAGNOSIS — Z48812 Encounter for surgical aftercare following surgery on the circulatory system: Secondary | ICD-10-CM | POA: Diagnosis not present

## 2016-08-18 DIAGNOSIS — M545 Low back pain: Secondary | ICD-10-CM | POA: Diagnosis not present

## 2016-08-21 ENCOUNTER — Telehealth: Payer: Self-pay | Admitting: Vascular Surgery

## 2016-08-21 NOTE — Telephone Encounter (Signed)
-----   Message from Sharee PimpleMarilyn K McChesney, RN sent at 08/16/2016  2:51 PM EDT ----- Regarding: change from 6 months to 3 months w/ CT chest   ----- Message ----- From: Sherren KernsFields, Charles E, MD Sent: 08/16/2016   2:39 PM To: [facility] Charge Pool  This patient has a chest nodule on her prior CT and will need chest CT repeated in a few months.  I had originally scheduled her for 6 months.  Can we change this to appt with me in 3 months with chest CT prior to visit.  Thanks  Fabienne Brunsharles Fields

## 2016-08-21 NOTE — Telephone Encounter (Signed)
Sched [facility]16[facility]30. Lab and MD at 2:00 and 2:45. Spoke to pt.

## 2016-08-25 DIAGNOSIS — Z48812 Encounter for surgical aftercare following surgery on the circulatory system: Secondary | ICD-10-CM | POA: Diagnosis not present

## 2016-08-25 DIAGNOSIS — E785 Hyperlipidemia, unspecified: Secondary | ICD-10-CM | POA: Diagnosis not present

## 2016-08-25 DIAGNOSIS — I714 Abdominal aortic aneurysm, without rupture: Secondary | ICD-10-CM | POA: Diagnosis not present

## 2016-08-25 DIAGNOSIS — M545 Low back pain: Secondary | ICD-10-CM | POA: Diagnosis not present

## 2016-08-25 DIAGNOSIS — J449 Chronic obstructive pulmonary disease, unspecified: Secondary | ICD-10-CM | POA: Diagnosis not present

## 2016-08-25 DIAGNOSIS — R279 Unspecified lack of coordination: Secondary | ICD-10-CM | POA: Diagnosis not present

## 2016-08-28 ENCOUNTER — Telehealth: Payer: Self-pay | Admitting: *Deleted

## 2016-08-28 NOTE — Telephone Encounter (Signed)
Spoke to patient regarding her daughters request for additional pain medication.  I explained to patient The length of time from her surgery and per Dr Evelina DunField's last office visit note.  Patient voiced understanding of this information.

## 2016-08-31 NOTE — Addendum Note (Signed)
Addended by: Burton ApleyPETTY, Avonna Iribe A on: 08/31/2016 03:37 PM   Modules accepted: Orders

## 2016-10-30 NOTE — Telephone Encounter (Signed)
Do you still wish to have the CT abd/pelvis ran as well as the CT Chest? Original order from appt in April req CT a/p

## 2016-11-02 NOTE — Addendum Note (Signed)
Addendum  created 11/02/16 1433 by Kyrillos Adams, MD   Sign clinical note    

## 2016-11-21 ENCOUNTER — Encounter: Payer: Self-pay | Admitting: Vascular Surgery

## 2016-11-23 ENCOUNTER — Ambulatory Visit: Payer: Medicare Other | Admitting: Vascular Surgery

## 2016-11-23 ENCOUNTER — Encounter (HOSPITAL_COMMUNITY): Payer: Medicare Other

## 2016-11-23 ENCOUNTER — Inpatient Hospital Stay: Admission: RE | Admit: 2016-11-23 | Payer: Medicare Other | Source: Ambulatory Visit

## 2016-12-13 ENCOUNTER — Other Ambulatory Visit: Payer: Self-pay | Admitting: *Deleted

## 2016-12-13 ENCOUNTER — Other Ambulatory Visit: Payer: Self-pay

## 2016-12-14 ENCOUNTER — Ambulatory Visit (INDEPENDENT_AMBULATORY_CARE_PROVIDER_SITE_OTHER): Payer: Medicare Other | Admitting: Vascular Surgery

## 2016-12-14 ENCOUNTER — Encounter: Payer: Self-pay | Admitting: Vascular Surgery

## 2016-12-14 ENCOUNTER — Ambulatory Visit
Admission: RE | Admit: 2016-12-14 | Discharge: 2016-12-14 | Disposition: A | Discharge status: Home / Self Care | Payer: Medicare Other | Source: Ambulatory Visit | Attending: Vascular Surgery | Admitting: Vascular Surgery

## 2016-12-14 ENCOUNTER — Ambulatory Visit (HOSPITAL_COMMUNITY)
Admission: RE | Admit: 2016-12-14 | Discharge: 2016-12-14 | Disposition: A | Discharge status: Home / Self Care | Payer: Medicare Other | Source: Ambulatory Visit | Attending: Vascular Surgery | Admitting: Vascular Surgery

## 2016-12-14 VITALS — BP 132/70 | HR 72 | Temp 97.5°F | Resp 16 | Ht 63.0 in | Wt 85.9 lb

## 2016-12-14 DIAGNOSIS — I739 Peripheral vascular disease, unspecified: Secondary | ICD-10-CM

## 2016-12-14 DIAGNOSIS — I714 Abdominal aortic aneurysm, without rupture, unspecified: Secondary | ICD-10-CM

## 2016-12-14 DIAGNOSIS — R911 Solitary pulmonary nodule: Secondary | ICD-10-CM | POA: Diagnosis not present

## 2016-12-14 IMAGING — CT CT CHEST W/O CM
2 series · 15 of 32 positions shown, 17 images · non-contrast
Comparison: [DATE]

CLINICAL DATA: Followup pulmonary nodule

EXAM:
CT CHEST WITHOUT CONTRAST
TECHNIQUE: Multidetector CT imaging of the chest was performed following the
standard protocol without IV contrast.

[Series 2: chest w/(date) · axial · 0.58mm/px · z∈[-298,-58]mm · 7 of 169 slices shown, 9 images]
[im 25/169  mediastinal]
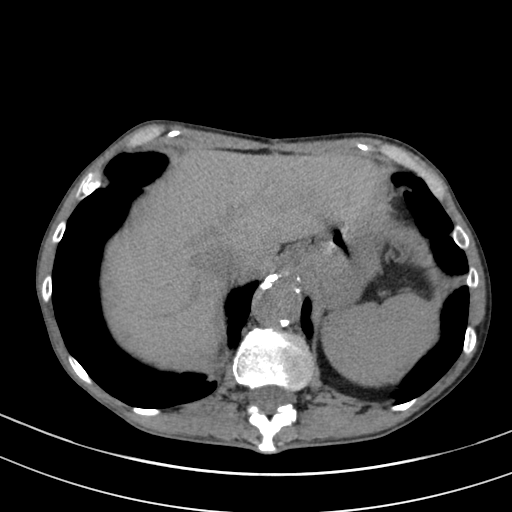
[im 25/169  lung]
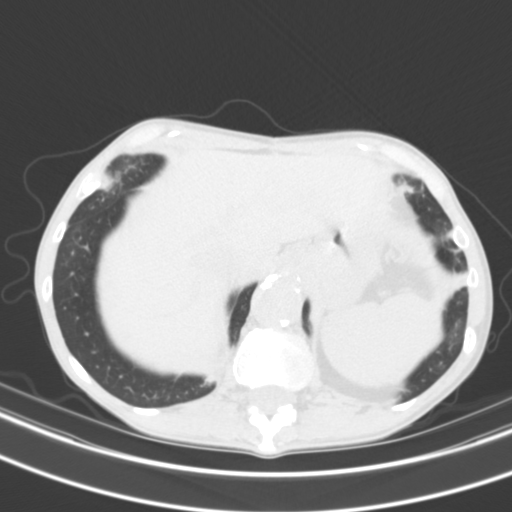
[im 49/169  lung]
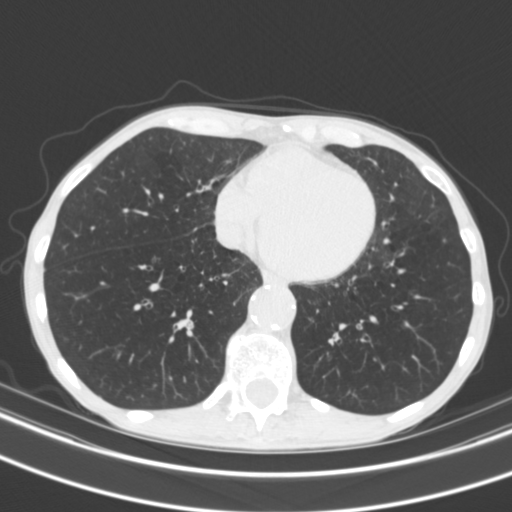
[im 73/169  lung]
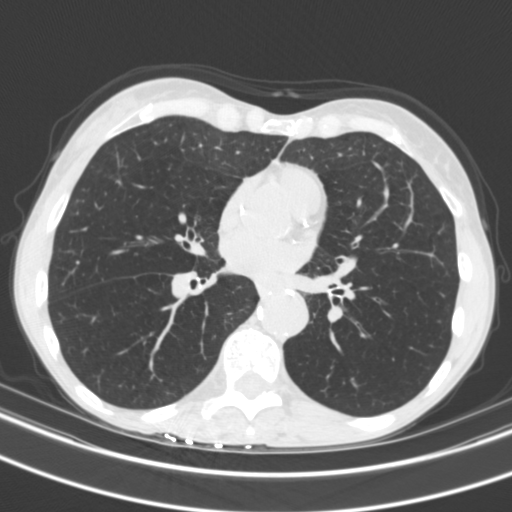
[im 80/169  lung]
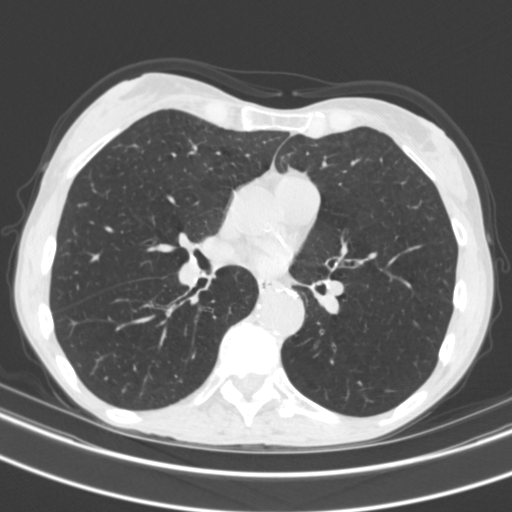
[im 97/169  mediastinal]
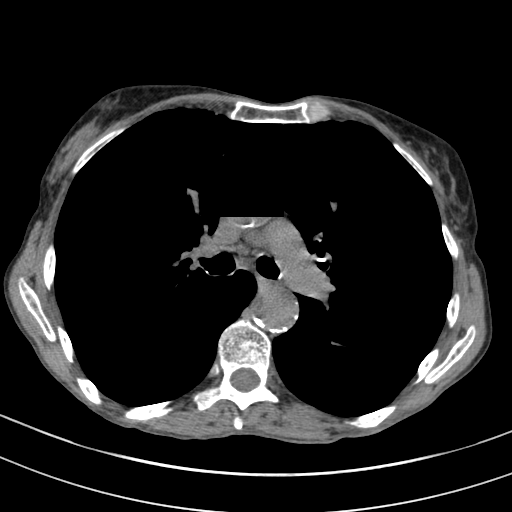
[im 97/169  lung]
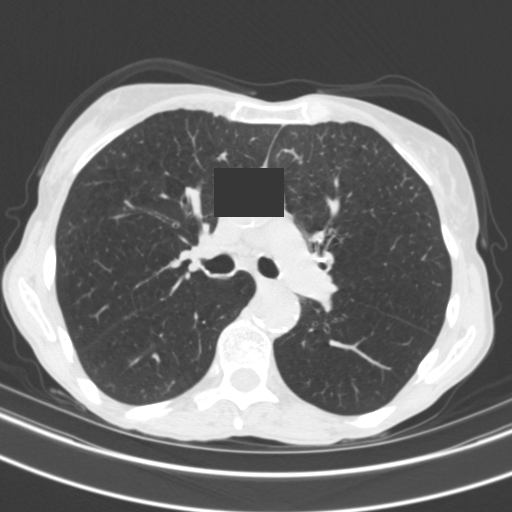
[im 121/169  lung]
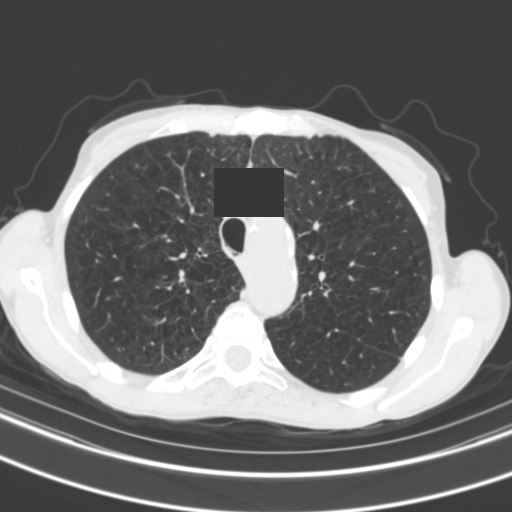
[im 145/169  lung]
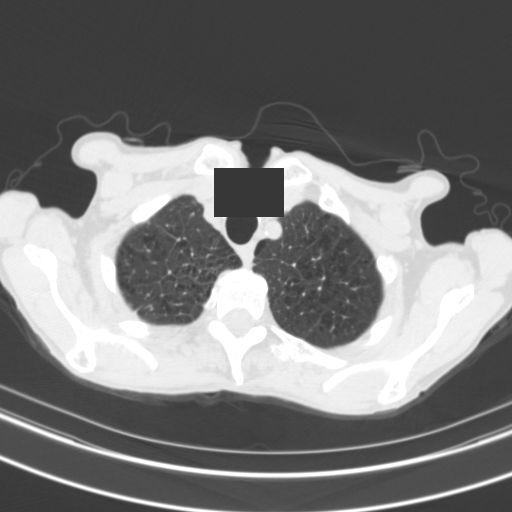

[Series 6: super d · axial · 0.58mm/px · z∈[-316,-86]mm · 8 of 417 slices shown]
[im 44/417  lung]
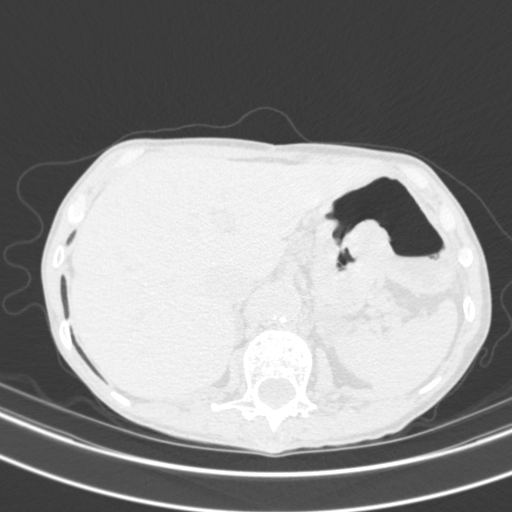
[im 88/417  lung]
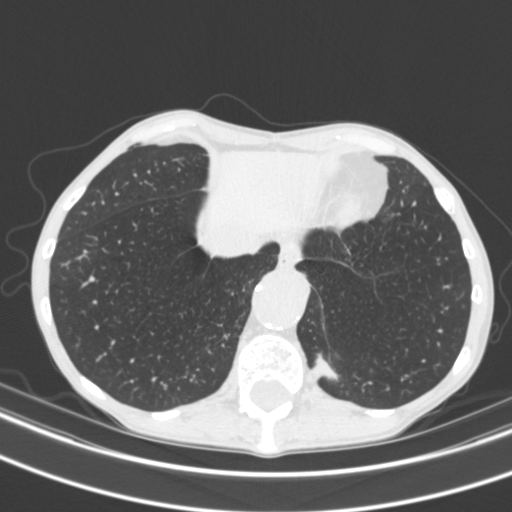
[im 139/417  lung]
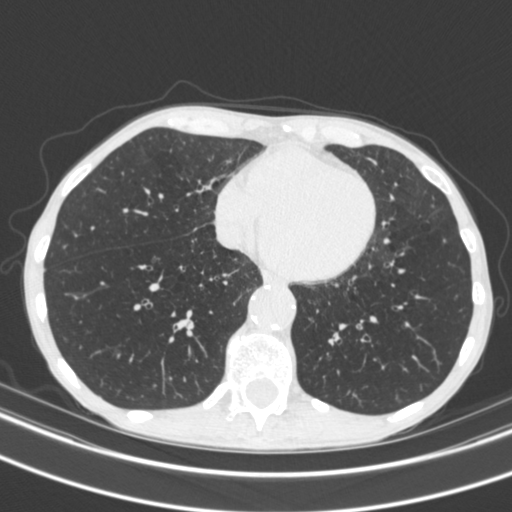
[im 198/417  lung]
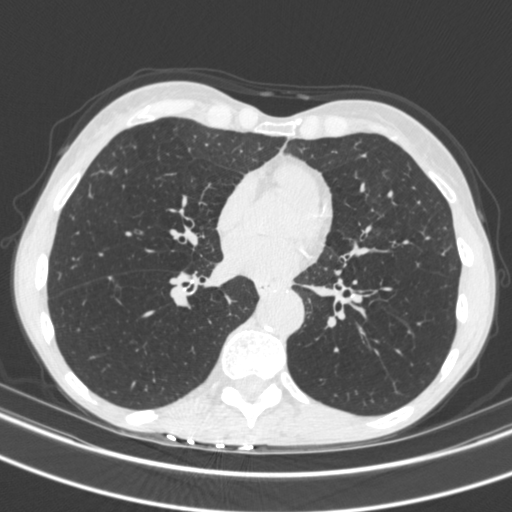
[im 228/417  lung]
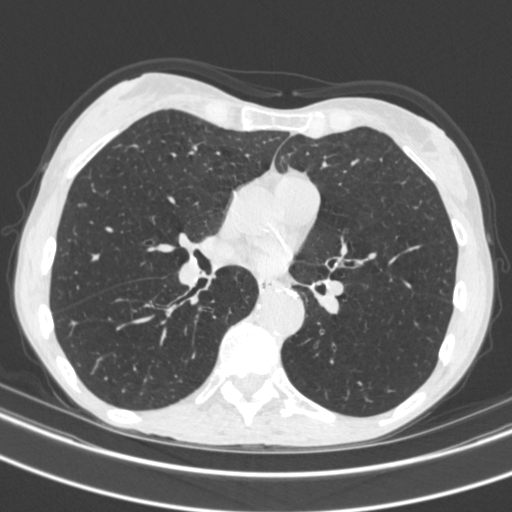
[im 278/417  lung]
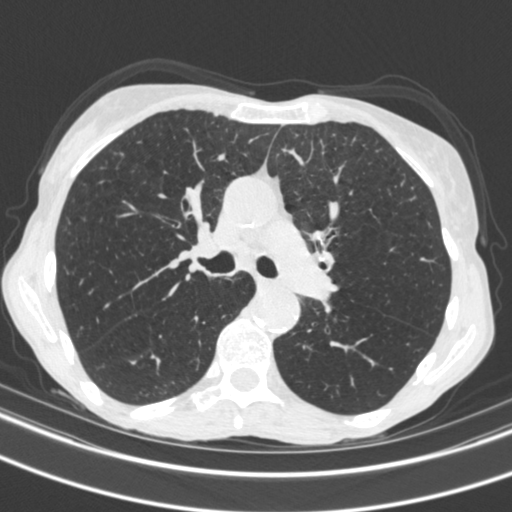
[im 329/417  lung]
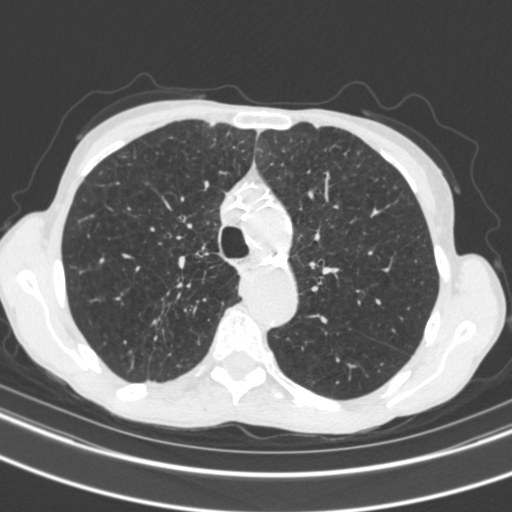
[im 373/417  lung]
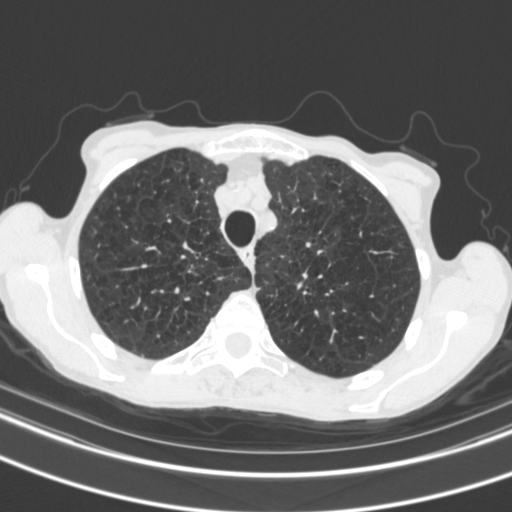

[15 of 32 positions shown; findings below may reference images not displayed]

FINDINGS: Cardiovascular: The heart size is normal. No pericardial effusion
identified. Aortic atherosclerosis. Calcifications in the RCA, left
circumflex and LAD coronary artery is noted.

Mediastinum/Nodes: No enlarged mediastinal or axillary lymph nodes.
Thyroid gland, trachea, and esophagus demonstrate no significant
findings.

Lungs/Pleura: No pleural effusion. Advanced changes of centrilobular
emphysema. Right upper lobe lung nodule measures 0.9 by 0.6 cm (mean
diameter 8 mm), image 29 of series 5. Previously this measured the
same.

Upper Abdomen: Right adrenal gland adenoma noted. No acute
abnormality within the upper abdomen.

Musculoskeletal: No aggressive lytic or sclerotic bone lesions.
IMPRESSION: 1. Stable right upper lobe lung nodule with a mean diameter of 8 mm
since [DATE]. [DATE] months of stability has been established.
This favors a benign etiology. No further followup is indicated at
this time. This recommendation follows the consensus statement:
Guidelines for Management of Incidental Pulmonary Nodules Detected
[DATE].
2. Aortic Atherosclerosis ([BQ]-[BQ]) and Emphysema ([BQ]-[BQ]).
3. Three vessel coronary artery calcifications noted.

## 2016-12-14 NOTE — Progress Notes (Signed)
Patient is a 66 year old female who returns for follow-up today after recent repair of abdominal aortic aneurysm. She had aorto to right common femoral and left common iliac artery grafting. This was done on 07/14/2016. She has returned to a normal diet. She has resumed her normal activities. She has no complaints. Any claudication symptoms. She also returns today for a follow-up chest CT to evaluate a lung nodule that was incidentally found on prior CT scan.  Review of systems: She denies shortness of breath. She denies chest pain.  Physical exam:  Vitals:   12/14/16 1055  BP: 132/70  Pulse: 72  Resp: 16  Temp: (!) 97.5 F (36.4 C)  TempSrc: Oral  SpO2: 92%  Weight: 85 lb 14.4 oz (39 kg)  Height:  (1.6 m)    Abdomen: Soft nontender nondistended well-healed midline laparotomy incision 2+ femoral pulses bilaterally 2+ dorsalis pedis pulses bilaterally  Data: I reviewed the patient's chest CT today. Aortic diameter was 3.4 cm. No obvious aneurysm. Prior lung nodule in the right upper lobe was 8 mm in diameter which is unchanged since January 2017. No further follow-up was recommended.  Assessment: Doing well status post repair of abdominal aortic aneurysm.  Plan: Follow-up in 5 years. Plan for repeat CT scan in 10 years to reevaluate her aortic graft.  Fabienne Bruns, MD [facility] Office: [phone] Pager: [phone]

## 2016-12-20 ENCOUNTER — Telehealth: Payer: Self-pay | Admitting: Vascular Surgery

## 2016-12-20 NOTE — Telephone Encounter (Signed)
Called pt regarding her chest CT findings.  Stable nodule does not need further follow up  Fabienne Bruns, MD [facility] Office: [phone] Pager: [phone]

## 2016-12-21 ENCOUNTER — Ambulatory Visit: Payer: Medicare Other | Admitting: Vascular Surgery

## 2017-01-10 DIAGNOSIS — Z136 Encounter for screening for cardiovascular disorders: Secondary | ICD-10-CM | POA: Diagnosis not present

## 2017-01-10 DIAGNOSIS — E785 Hyperlipidemia, unspecified: Secondary | ICD-10-CM | POA: Diagnosis not present

## 2017-01-10 DIAGNOSIS — Z0000 Encounter for general adult medical examination without abnormal findings: Secondary | ICD-10-CM | POA: Diagnosis not present

## 2017-01-10 DIAGNOSIS — Z1211 Encounter for screening for malignant neoplasm of colon: Secondary | ICD-10-CM | POA: Diagnosis not present

## 2017-01-15 DIAGNOSIS — R636 Underweight: Secondary | ICD-10-CM | POA: Diagnosis not present

## 2017-01-15 DIAGNOSIS — Z9889 Other specified postprocedural states: Secondary | ICD-10-CM | POA: Diagnosis not present

## 2017-01-15 DIAGNOSIS — I2510 Atherosclerotic heart disease of native coronary artery without angina pectoris: Secondary | ICD-10-CM | POA: Diagnosis not present

## 2017-01-15 DIAGNOSIS — Z7982 Long term (current) use of aspirin: Secondary | ICD-10-CM | POA: Diagnosis not present

## 2017-01-15 DIAGNOSIS — E785 Hyperlipidemia, unspecified: Secondary | ICD-10-CM | POA: Diagnosis not present

## 2017-01-15 DIAGNOSIS — Z23 Encounter for immunization: Secondary | ICD-10-CM | POA: Diagnosis not present

## 2017-01-15 DIAGNOSIS — J449 Chronic obstructive pulmonary disease, unspecified: Secondary | ICD-10-CM | POA: Diagnosis not present

## 2017-01-15 DIAGNOSIS — Z681 Body mass index (BMI) 19 or less, adult: Secondary | ICD-10-CM | POA: Diagnosis not present

## 2017-02-22 ENCOUNTER — Ambulatory Visit: Payer: Medicare Other | Admitting: Vascular Surgery

## 2017-02-22 ENCOUNTER — Encounter (HOSPITAL_COMMUNITY): Payer: Medicare Other

## 2017-07-16 DIAGNOSIS — Z681 Body mass index (BMI) 19 or less, adult: Secondary | ICD-10-CM | POA: Diagnosis not present

## 2017-07-16 DIAGNOSIS — I2510 Atherosclerotic heart disease of native coronary artery without angina pectoris: Secondary | ICD-10-CM | POA: Diagnosis not present

## 2017-07-16 DIAGNOSIS — E785 Hyperlipidemia, unspecified: Secondary | ICD-10-CM | POA: Diagnosis not present

## 2017-07-16 DIAGNOSIS — R636 Underweight: Secondary | ICD-10-CM | POA: Diagnosis not present

## 2017-07-16 DIAGNOSIS — J449 Chronic obstructive pulmonary disease, unspecified: Secondary | ICD-10-CM | POA: Diagnosis not present

## 2017-10-12 ENCOUNTER — Other Ambulatory Visit: Payer: Self-pay

## 2018-01-14 DIAGNOSIS — Z9181 History of falling: Secondary | ICD-10-CM | POA: Diagnosis not present

## 2018-01-14 DIAGNOSIS — Z0000 Encounter for general adult medical examination without abnormal findings: Secondary | ICD-10-CM | POA: Diagnosis not present

## 2018-01-14 DIAGNOSIS — Z23 Encounter for immunization: Secondary | ICD-10-CM | POA: Diagnosis not present

## 2018-01-14 DIAGNOSIS — Z1331 Encounter for screening for depression: Secondary | ICD-10-CM | POA: Diagnosis not present

## 2018-01-14 DIAGNOSIS — Z139 Encounter for screening, unspecified: Secondary | ICD-10-CM | POA: Diagnosis not present

## 2018-01-14 DIAGNOSIS — Z1339 Encounter for screening examination for other mental health and behavioral disorders: Secondary | ICD-10-CM | POA: Diagnosis not present

## 2018-01-14 DIAGNOSIS — E785 Hyperlipidemia, unspecified: Secondary | ICD-10-CM | POA: Diagnosis not present

## 2018-01-14 DIAGNOSIS — Z136 Encounter for screening for cardiovascular disorders: Secondary | ICD-10-CM | POA: Diagnosis not present

## 2018-01-16 DIAGNOSIS — Z9181 History of falling: Secondary | ICD-10-CM | POA: Diagnosis not present

## 2018-01-16 DIAGNOSIS — Z87891 Personal history of nicotine dependence: Secondary | ICD-10-CM | POA: Diagnosis not present

## 2018-01-16 DIAGNOSIS — I2510 Atherosclerotic heart disease of native coronary artery without angina pectoris: Secondary | ICD-10-CM | POA: Diagnosis not present

## 2018-01-16 DIAGNOSIS — J449 Chronic obstructive pulmonary disease, unspecified: Secondary | ICD-10-CM | POA: Diagnosis not present

## 2018-01-16 DIAGNOSIS — E785 Hyperlipidemia, unspecified: Secondary | ICD-10-CM | POA: Diagnosis not present

## 2018-01-16 DIAGNOSIS — R636 Underweight: Secondary | ICD-10-CM | POA: Diagnosis not present

## 2018-01-16 DIAGNOSIS — Z681 Body mass index (BMI) 19 or less, adult: Secondary | ICD-10-CM | POA: Diagnosis not present

## 2018-01-30 ENCOUNTER — Other Ambulatory Visit: Payer: Self-pay

## 2018-07-19 DIAGNOSIS — E785 Hyperlipidemia, unspecified: Secondary | ICD-10-CM | POA: Diagnosis not present

## 2018-07-19 DIAGNOSIS — I2510 Atherosclerotic heart disease of native coronary artery without angina pectoris: Secondary | ICD-10-CM | POA: Diagnosis not present

## 2018-07-23 DIAGNOSIS — J449 Chronic obstructive pulmonary disease, unspecified: Secondary | ICD-10-CM | POA: Diagnosis not present

## 2018-07-23 DIAGNOSIS — I2510 Atherosclerotic heart disease of native coronary artery without angina pectoris: Secondary | ICD-10-CM | POA: Diagnosis not present

## 2018-07-23 DIAGNOSIS — Z720 Tobacco use: Secondary | ICD-10-CM | POA: Diagnosis not present

## 2018-07-23 DIAGNOSIS — E785 Hyperlipidemia, unspecified: Secondary | ICD-10-CM | POA: Diagnosis not present

## 2018-08-12 DIAGNOSIS — H5213 Myopia, bilateral: Secondary | ICD-10-CM | POA: Diagnosis not present

## 2018-08-12 DIAGNOSIS — H52223 Regular astigmatism, bilateral: Secondary | ICD-10-CM | POA: Diagnosis not present

## 2018-08-12 DIAGNOSIS — H25813 Combined forms of age-related cataract, bilateral: Secondary | ICD-10-CM | POA: Diagnosis not present

## 2019-01-16 DIAGNOSIS — E785 Hyperlipidemia, unspecified: Secondary | ICD-10-CM | POA: Diagnosis not present

## 2019-01-16 DIAGNOSIS — Z0000 Encounter for general adult medical examination without abnormal findings: Secondary | ICD-10-CM | POA: Diagnosis not present

## 2019-01-16 DIAGNOSIS — Z9181 History of falling: Secondary | ICD-10-CM | POA: Diagnosis not present

## 2019-01-16 DIAGNOSIS — Z1331 Encounter for screening for depression: Secondary | ICD-10-CM | POA: Diagnosis not present

## 2019-01-16 DIAGNOSIS — Z139 Encounter for screening, unspecified: Secondary | ICD-10-CM | POA: Diagnosis not present

## 2019-02-05 ENCOUNTER — Other Ambulatory Visit: Payer: Self-pay

## 2019-04-08 DIAGNOSIS — I2510 Atherosclerotic heart disease of native coronary artery without angina pectoris: Secondary | ICD-10-CM | POA: Diagnosis not present

## 2019-04-08 DIAGNOSIS — Z87891 Personal history of nicotine dependence: Secondary | ICD-10-CM | POA: Diagnosis not present

## 2019-04-08 DIAGNOSIS — J449 Chronic obstructive pulmonary disease, unspecified: Secondary | ICD-10-CM | POA: Diagnosis not present

## 2019-04-08 DIAGNOSIS — E785 Hyperlipidemia, unspecified: Secondary | ICD-10-CM | POA: Diagnosis not present

## 2019-04-08 DIAGNOSIS — R636 Underweight: Secondary | ICD-10-CM | POA: Diagnosis not present

## 2019-04-08 DIAGNOSIS — E46 Unspecified protein-calorie malnutrition: Secondary | ICD-10-CM | POA: Diagnosis not present

## 2019-05-12 DIAGNOSIS — Z23 Encounter for immunization: Secondary | ICD-10-CM | POA: Diagnosis not present

## 2019-06-09 DIAGNOSIS — Z23 Encounter for immunization: Secondary | ICD-10-CM | POA: Diagnosis not present

## 2019-10-06 DIAGNOSIS — Z681 Body mass index (BMI) 19 or less, adult: Secondary | ICD-10-CM | POA: Diagnosis not present

## 2019-10-06 DIAGNOSIS — Z1231 Encounter for screening mammogram for malignant neoplasm of breast: Secondary | ICD-10-CM | POA: Diagnosis not present

## 2019-10-06 DIAGNOSIS — E785 Hyperlipidemia, unspecified: Secondary | ICD-10-CM | POA: Diagnosis not present

## 2019-10-06 DIAGNOSIS — J449 Chronic obstructive pulmonary disease, unspecified: Secondary | ICD-10-CM | POA: Diagnosis not present

## 2019-10-06 DIAGNOSIS — E46 Unspecified protein-calorie malnutrition: Secondary | ICD-10-CM | POA: Diagnosis not present

## 2019-10-06 DIAGNOSIS — I2510 Atherosclerotic heart disease of native coronary artery without angina pectoris: Secondary | ICD-10-CM | POA: Diagnosis not present

## 2019-10-31 ENCOUNTER — Ambulatory Visit (INDEPENDENT_AMBULATORY_CARE_PROVIDER_SITE_OTHER): Payer: Medicare Other | Admitting: Pulmonary Disease

## 2019-10-31 ENCOUNTER — Other Ambulatory Visit: Payer: Self-pay

## 2019-10-31 ENCOUNTER — Encounter: Payer: Self-pay | Admitting: Pulmonary Disease

## 2019-10-31 VITALS — BP 130/70 | HR 75 | Ht 63.0 in | Wt 80.0 lb

## 2019-10-31 DIAGNOSIS — J449 Chronic obstructive pulmonary disease, unspecified: Secondary | ICD-10-CM

## 2019-10-31 LAB — CBC WITH DIFFERENTIAL/PLATELET
Basophils Absolute: 0 10*3/uL (ref 0.0–0.1)
Basophils Relative: 0.5 % (ref 0.0–3.0)
Eosinophils Absolute: 0.3 10*3/uL (ref 0.0–0.7)
Eosinophils Relative: 3.8 % (ref 0.0–5.0)
HCT: 40.8 % (ref 36.0–46.0)
Hemoglobin: 13.6 g/dL (ref 12.0–15.0)
Lymphocytes Relative: 25.4 % (ref 12.0–46.0)
Lymphs Abs: 2 10*3/uL (ref 0.7–4.0)
MCHC: 33.3 g/dL (ref 30.0–36.0)
MCV: 93.9 fl (ref 78.0–100.0)
Monocytes Absolute: 0.4 10*3/uL (ref 0.1–1.0)
Monocytes Relative: 5.5 % (ref 3.0–12.0)
Neutro Abs: 5 10*3/uL (ref 1.4–7.7)
Neutrophils Relative %: 64.8 % (ref 43.0–77.0)
Platelets: 169 10*3/uL (ref 150.0–400.0)
RBC: 4.35 Mil/uL (ref 3.87–5.11)
RDW: 14.4 % (ref 11.5–15.5)
WBC: 7.7 10*3/uL (ref 4.0–10.5)

## 2019-10-31 NOTE — Patient Instructions (Addendum)
Nice to meet you!  I am glad the Spiriva and Anoro seem to help.  Please continue these for now.  I have sent a referral for pulmonary rehab to [place].  Please read below for further information.  I will see you back in about 3 months - we will do breathing test at that time as well as a 6-minute walk test to double check your oxygen.  Pulmonary Rehabilitation  Pulmonary rehabilitation, also called pulmonary rehab, is a program that helps people manage their breathing problems. The main goals are to increase endurance, reduce breathlessness, and improve quality of life. Pulmonary rehab can last 4-12 weeks or more, depending on your condition. You may need pulmonary rehab if:  You are recovering from lung surgery.  You have ongoing (chronic) lung problems or a condition that makes it hard to breathe, such as: ? A disease that causes scars in lung tissue (interstitial lung disease), including idiopathic pulmonary fibrosis and sarcoidosis. ? Chronic obstructive pulmonary disease (COPD). ? Cystic fibrosis. ? Diseases that affect the muscles used for breathing, such as muscular dystrophy. Benefits of pulmonary rehab Pulmonary rehab may help you:  Increase your ability to exercise.  Reduce breathing problems.  Quit smoking.  Learn how to eat a healthy diet.  Manage a healthy weight.  Learn how to use oxygen therapy.  Manage and understand your medicines and treatment.  Get support from health experts as well as other people with similar problems.  Manage anxiety and depression.  Teach your family about your condition and how to take part in your recovery. Tell a health care provider about:  Any allergies you have.  All medicines you are taking, including vitamins, herbs, eye drops, creams, and over-the-counter medicines.  Any blood disorders you have.  Any surgeries you have had.  Any medical conditions you have.  Whether you are pregnant or may be pregnant. What  are the risks? Generally, pulmonary rehab is safe. However, problems may occur, including:  Exercise-related injuries.  Higher risk for heart attack or stroke in some cases, due to certain types of exercise (rare). What happens before treatment?  You may have a physical exam in which your health care provider may: ? Do blood tests. ? Test how well you can breathe and how your lungs function. ? Test your ability to exercise, such as how long you can walk on a treadmill.  Your health care providers will work with you to make a treatment plan based on your health and your goals. Your program will be tailored to fit your needs and may change as you make progress. What happens during treatment? Exercise training Exercise training involves doing physical activity, such as:  Stretching routines.  Strength-building exercises with weights. You will work on becoming stronger in both your arms and legs.  Aerobic exercises to improve endurance. You may use a stationary bike or treadmill. Exercise training will vary based on how much activity you can handle, and it will slowly become harder or more intense as you build up your endurance. In most cases, you will have exercise training 3 days a week. After you finish pulmonary rehab, your health care provider will create an exercise program to help you maintain your progress. Education Your rehab team will teach you about your disease and ways you can manage symptoms. You may have one-on-one sessions or group meetings. You may learn:  How to avoid situations that can worsen symptoms.  When and how to take your medicines.  How to prevent lung infections.  How to quit smoking.  How to use oxygen therapy. Nutrition support Being overweight or underweight can make it harder to breathe. You may meet with nutritionists to come up with the right diet for you. This may include:  A healthy eating plan to help you lose weight.  Adding calorie or  protein supplements to help you gain weight or avoid losing weight. Breathing training Breathing exercises can help you better control your breathing by taking deeper breaths, less often. Breathing exercises may include:  Pursed-lip breathing. During this technique, you will inhale through your nose and exhale for 4-6 seconds through pursed lips (such as a kissing or whistling position).  Belly breathing (diaphragmatic breathing). During this technique, you place your hands on your stomach and inhale through your nose. As you breathe in, you should feel your belly rise as air fills your diaphragm. Then, you exhale slowly through pursed lips as you feel your stomach falling.  Energy conservation training Your rehab team will show you how to complete everyday tasks without getting out of breath. This may include techniques for avoiding bending, lifting, or reaching. Counseling You may receive individual or group counseling during pulmonary rehab. These meetings can help with any anxiety, depression, or frustrations you may be feeling. Counseling may include:  Muscle relaxation exercises.  Techniques for dealing with stress or panic. Your family members and caregivers may also participate in counseling. What can I expect after the treatment? Follow these instructions at home:  Exercise regularly as told by your health care providers. Your health care providers will create an exercise plan to help you maintain your endurance and your overall health.  Do not use any products that contain nicotine or tobacco, such as cigarettes and e-cigarettes. If you need help quitting, ask your health care provider.  Take over-the-counter and prescription medicines only as told by your health care provider.  Keep all follow-up visits as told by your health care provider. This is important. Contact a health care provider if:  You have questions about your program.  You do not notice any improvement in your  breathing or endurance.  You develop new or worse symptoms.  You have a fever.  You have shortness of breath or fatigue when exercising. Get help right away if:  You have shortness of breath when you are sitting still or lying down.  You feel dizzy or faint. Summary  Pulmonary rehabilitation is a program that helps people manage their breathing problems. Your program will be tailored to you.  The main goals of pulmonary rehab are to increase endurance, reduce breathlessness, and improve quality of life.  Once you finish rehab, your health care providers will create an exercise plan to help you maintain your progress.  Do not use any products that contain nicotine or tobacco, such as cigarettes and e-cigarettes. If you need help quitting, ask your health care provider. This information is not intended to replace advice given to you by your health care provider. Make sure you discuss any questions you have with your health care provider. Document Revised: 06/05/2017 Document Reviewed: 06/05/2017 Elsevier Patient Education  2020 [employer].

## 2019-10-31 NOTE — Progress Notes (Signed)
Patient ID: Alexandra Mays, female    DOB: 1950-10-08, 69 y.o.   MRN: [id number]  Chief Complaint  Patient presents with  . Consult    Pt is being referred by Heide Guile, NP. Pt states that she has had complaints of SOB that is worse with exertion. Pt does have an occ cough with clear phlegm.    Referring provider: Ronal Fear, NP  HPI:   Alexandra Mays is a 69 year old woman with reported history of COPD (no prior PFTs) tumor seen in evaluation at the request of Heide Guile, NP for evaluation of COPD and progressive dyspnea on exertion.  Notes from referring provider reviewed.  Patient has exertional dyspnea for several years, at least 5-6.  This is progressed over time and continues to worsen.  Currently noted to be severe.  Present with exertion, not with rest.  Waxes and wanes day-to-day.  Exertional capacity changes day-to-day.  For example, she will have several days where she does not feel limited at all in terms of exercise capacity and then will have a few days where she feels like he can do very little.  Minimal exertion such as walking on flat surfaces of 200 feet causes significant dyspnea.  No chest pain.  No chest tightness.  Denies any wheezing.  Has cough present for years.  Not usually bothersome.  When she first had a breathing difficulties some years ago she started Spiriva.  This worked very well and improved her breathing greatly.  With progressive decline she has been trialed on Breo in addition to Spiriva.  She felt no improvement.  Recently, about 2 weeks ago she was started on Anoro in addition to Spiriva and she feels that there has been some improvement in her dyspnea on exertion with these medications.  She uses albuterol more frequently recently.  Occasionally is helpful but sometimes not.  Review of prior imaging most recent CT chest 2018 on my interpretation reveals clear lungs, stable right upper lobe nodule with some pleural tethering, and panlobular emphysema most  notable in bilateral upper lobes.  PMH: PE, heart attack 2007, COPD, tobacco abuse Surgical history: Reviewed, none Family history: Stroke in mother Social history: Lives with granddaughter in [place], continues to smoke about half a pack a day   Questionaires / Pulmonary Flowsheets:   ACT:  No flowsheet data found.  MMRC: No flowsheet data found.  Epworth:  No flowsheet data found.  Tests:   FENO:  No results found for: NITRICOXIDE  PFT: No flowsheet data found.  WALK:  No flowsheet data found.  Imaging: No results found.  Lab Results:  CBC    Component Value Date/Time   WBC 7.2 08/15/2016 1419   RBC 4.22 08/15/2016 1419   HGB 12.6 08/15/2016 1419   HCT 38.8 08/15/2016 1419   PLT 290 08/15/2016 1419   MCV 91.9 08/15/2016 1419   MCH 29.9 08/15/2016 1419   MCHC 32.5 08/15/2016 1419   RDW 15.0 08/15/2016 1419    BMET    Component Value Date/Time   NA 138 08/15/2016 1419   K 3.7 08/15/2016 1419   CL 105 08/15/2016 1419   CO2 25 08/15/2016 1419   GLUCOSE 92 08/15/2016 1419   BUN 6 08/15/2016 1419   CREATININE 0.68 08/15/2016 1419   CALCIUM 9.1 08/15/2016 1419   GFRNONAA >60 08/15/2016 1419   GFRAA >60 08/15/2016 1419    BNP No results found for: BNP  ProBNP No results found for: PROBNP  Specialty  Problems    None      Allergies  Allergen Reactions  . No Known Allergies     Immunization History  Administered Date(s) Administered  . Influenza-Unspecified 01/11/2018  . Moderna SARS-COVID-2 Vaccination 05/12/2019, 06/09/2019    Past Medical History:  Diagnosis Date  . AAA (abdominal aortic aneurysm) (HCC)    AAA  . COPD (chronic obstructive pulmonary disease) (HCC)   . Dyspnea    with exertion  . Pure hyperglyceridemia     Tobacco History: Social History   Tobacco Use  Smoking Status Current Every Day Smoker  . Packs/day: 0.75  . Years: 40.00  . Pack years: 30.00  . Types: Cigarettes  Smokeless Tobacco Never Used    Tobacco Comment   0.5ppd as of 10/31/19   Ready to quit: Not Answered Counseling given: Not Answered Comment: 0.5ppd as of 10/31/19    Outpatient Encounter Medications as of 10/31/2019  Medication Sig  . albuterol (PROVENTIL HFA;VENTOLIN HFA) 108 (90 Base) MCG/ACT inhaler Inhale 2 puffs into the lungs every 6 (six) hours as needed for wheezing or shortness of breath.  Ailene Ards ELLIPTA 62.5-25 MCG/INH AEPB Inhale 1 puff into the lungs daily.  Marland Kitchen ibuprofen (ADVIL,MOTRIN) 200 MG tablet Take 800 mg by mouth every 6 (six) hours as needed for mild pain.  . polyethylene glycol (MIRALAX / GLYCOLAX) packet Take 17 g by mouth daily.  . simvastatin (ZOCOR) 20 MG tablet Take 20 mg by mouth every morning.   . Tiotropium Bromide Monohydrate (SPIRIVA RESPIMAT) 2.5 MCG/ACT AERS Inhale 2 puffs into the lungs daily.  . [DISCONTINUED] Fluticasone Furoate-Vilanterol (BREO ELLIPTA) 100-25 MCG/INH AEPB Inhale 1 puff into the lungs daily.    No facility-administered encounter medications on file as of 10/31/2019.     Review of Systems  Review of Systems  No chest pain exertion, does not feel like she wheezes.  No orthopnea PND.  No lower extremity edema. Physical Exam  BP 130/70 (BP Location: Right Arm, Cuff Size: Normal)   Pulse 75   Ht 5\' 3"  (1.6 m)   Wt 80 lb (36.3 kg)   SpO2 97%   BMI 14.17 kg/m   Wt Readings from Last 5 Encounters:  10/31/19 80 lb (36.3 kg)  12/14/16 85 lb 14.4 oz (39 kg)  08/16/16 85 lb (38.6 kg)  08/15/16 104 lb (47.2 kg)  07/26/16 104 lb 8 oz (47.4 kg)    BMI Readings from Last 5 Encounters:  10/31/19 14.17 kg/m  12/14/16 15.22 kg/m  08/16/16 15.06 kg/m  08/15/16 18.42 kg/m  07/26/16 18.51 kg/m     Physical Exam General: Very thin, no acute distress Eyes: EOMI, no icterus Neck: Supple, no JVD appreciated Respiratory: Diminished breath sounds bilaterally, clear on inspiration with mild end expiratory wheeze throughout, no work of breathing on room  air Cardiovascular: Regular rhythm, no murmurs Abdomen: Soft, bowel sounds present MSK:: No synovitis, no joint effusion Skin: Darkened pigmentation on forearms, appearance of easy bruising Neuro: Normal gait, no weakness Psych: Normal mood, flat affect   Assessment & Plan:   Ms. Guimaraes is a 69 year old woman with reported history of COPD (no prior PFTs) tumor seen in evaluation at the request of 62, NP for evaluation of COPD and progressive dyspnea on exertion.  Dyspnea on exertion: While she has no formal PFTs she has significant upper lobe predominant emphysematous changes on CT chest seen from 2018.  Suspect her progressive dyspnea on exertion is related to progressive COPD especially in the setting  of ongoing cigarette smoking.  Interestingly, she has variable exercise tolerance from day-to-day.  Mild wheeze on exam.  Do wonder about asthma/COPD overlap.  Unfortunately, prior trial of Breo did not improve symptoms per her report.  No history of exacerbations.  Recommend she continue maximal bronchodilator therapy-currently on Anoro in addition to Spiriva.  Okay to continue for now as long as she is not experiencing side effects and able to easily obtain.  Would be reasonable to discontinue Spiriva in the future and assess symptom response.  Finally, highly recommend pulmonary rehab.  Discussed in detail.  Referral sent to hospital in [facility] system in [place] city given close proximity to home.  Will obtain PFTs at next visit.  Consider lung reduction or bronchoscopic valves in future based on breathing tests and state of health upon next visits.  Tobacco abuse: She expressed desire to quit.  Has tried in the past.  Down to half a pack a day.  Has not been successful nicotine replacement in the past.  After consider both negative patches and as needed gum/lozenges as she is not trialed both in the past.  In addition, encouraged her to find interventions that may help relieve her oral  fixation.  Right upper lobe pulmonary nodule: Follow-up CT scans in stable on multiple scans most recently 2018, no longer following per Fleischner criteria.  Lung cancer screening: Discussed at some length today.  Described prescribed requirements and risk and benefits.  She does not wish to know if she has lung cancer not and does not feel like she would want any treatment for it if it was discovered.  Defer at this time.  Return in about 3 months (around 01/31/2020).   Karren Burly, MD 10/31/2019

## 2019-11-12 DIAGNOSIS — J449 Chronic obstructive pulmonary disease, unspecified: Secondary | ICD-10-CM | POA: Diagnosis not present

## 2019-11-12 DIAGNOSIS — Z20822 Contact with and (suspected) exposure to covid-19: Secondary | ICD-10-CM | POA: Diagnosis not present

## 2019-11-13 ENCOUNTER — Telehealth: Payer: Self-pay | Admitting: Pulmonary Disease

## 2019-11-13 DIAGNOSIS — J449 Chronic obstructive pulmonary disease, unspecified: Secondary | ICD-10-CM

## 2019-11-13 NOTE — Telephone Encounter (Signed)
Called granddaughter [name] (dpr on file), requesting a nebulizer machine to be prescribed for pt.  Pt has never been prescribed a nebulizer, but has used her great-granddaughter's albuterol nebulizer when she was having increased sob, and this seemed to provider greater relief than her albuterol HFA.   Dr. Judeth Horn please advise if ok to prescribe albuterol neb and neb machine.  Thanks!

## 2019-11-18 NOTE — Telephone Encounter (Signed)
lmtcb for [name].

## 2019-11-19 NOTE — Telephone Encounter (Signed)
Grand daughter Alexandra Mays returning call - [phone]

## 2019-11-19 NOTE — Telephone Encounter (Signed)
Hunsucker, Lesia Sago, MD  You 2 minutes ago (4:35 PM)  MH I responded yesterday to a prior message and said yes, please order nebulizer and albuterol neb q6 hrs PRN wheezing/SOB. Thanks!    Routing comment   You routed conversation to [employer], Lesia Sago, MD 1 hour ago (3:05 PM)  You 1 hour ago (3:05 PM)     Spoke with [name] and made her aware still awaiting response from MD      Documentation   Henreitta Leber routed conversation to [facility] Triage Pool 1 hour ago (2:50 PM)  Henreitta Leber 1 hour ago (2:50 PM)  TB   [place] daughter [name] returning call - [phone]      Documentation   Caffie Damme, Angelita Ingles, RN Yesterday (10:50 AM)     lmtcb for [employer].       Documentation   Hunsucker, Lesia Sago, MD  Velvet Bathe, CMA Yesterday (8:03 AM)  MH Yes, please order nebulizer and albuterol nebs q6 hrs PRN wheezing or SOB. Thanks!   Routing comment   Velvet Bathe, CMA routed conversation to [facility], Lesia Sago, MD 6 days ago  Velvet Bathe, [place] 6 days ago   [facility] for [employer]

## 2019-11-19 NOTE — Telephone Encounter (Signed)
Spoke with [name] and made her aware still awaiting response from MD

## 2019-11-20 NOTE — Telephone Encounter (Signed)
lmtcb for [name].  

## 2019-11-21 MED ORDER — ALBUTEROL SULFATE (2.5 MG/3ML) 0.083% IN NEBU: 2.5000 mg | INHALATION_SOLUTION | Freq: Four times a day (QID) | RESPIRATORY_TRACT | 5 refills | Status: DC | PRN

## 2019-11-21 NOTE — Telephone Encounter (Signed)
Alexandra Mays returning a phone call. Alexandra Mays can be reached at [phone]

## 2019-11-21 NOTE — Telephone Encounter (Signed)
Spoke with patient's granddaughter [name]. She is aware that Dr. Judeth Horn approved the nebulizer and albuterol solution. I explained to her the process of getting a nebulizer machine. Patient is not currently established with a DME company.   Orders have been placed. Nothing further needed at time of call.

## 2020-04-09 DIAGNOSIS — Z720 Tobacco use: Secondary | ICD-10-CM | POA: Diagnosis not present

## 2020-04-09 DIAGNOSIS — Z681 Body mass index (BMI) 19 or less, adult: Secondary | ICD-10-CM | POA: Diagnosis not present

## 2020-04-09 DIAGNOSIS — E46 Unspecified protein-calorie malnutrition: Secondary | ICD-10-CM | POA: Diagnosis not present

## 2020-04-09 DIAGNOSIS — Z87891 Personal history of nicotine dependence: Secondary | ICD-10-CM | POA: Diagnosis not present

## 2020-04-09 DIAGNOSIS — I2510 Atherosclerotic heart disease of native coronary artery without angina pectoris: Secondary | ICD-10-CM | POA: Diagnosis not present

## 2020-04-09 DIAGNOSIS — J449 Chronic obstructive pulmonary disease, unspecified: Secondary | ICD-10-CM | POA: Diagnosis not present

## 2020-04-09 DIAGNOSIS — Z1231 Encounter for screening mammogram for malignant neoplasm of breast: Secondary | ICD-10-CM | POA: Diagnosis not present

## 2020-04-09 DIAGNOSIS — E785 Hyperlipidemia, unspecified: Secondary | ICD-10-CM | POA: Diagnosis not present

## 2020-04-09 DIAGNOSIS — R636 Underweight: Secondary | ICD-10-CM | POA: Diagnosis not present

## 2020-05-12 DIAGNOSIS — Z1331 Encounter for screening for depression: Secondary | ICD-10-CM | POA: Diagnosis not present

## 2020-05-12 DIAGNOSIS — Z9181 History of falling: Secondary | ICD-10-CM | POA: Diagnosis not present

## 2020-05-12 DIAGNOSIS — Z0000 Encounter for general adult medical examination without abnormal findings: Secondary | ICD-10-CM | POA: Diagnosis not present

## 2020-05-12 DIAGNOSIS — E785 Hyperlipidemia, unspecified: Secondary | ICD-10-CM | POA: Diagnosis not present

## 2020-05-13 ENCOUNTER — Other Ambulatory Visit: Payer: Self-pay

## 2020-05-13 ENCOUNTER — Emergency Department (HOSPITAL_COMMUNITY): Payer: Medicare Other

## 2020-05-13 ENCOUNTER — Encounter (HOSPITAL_COMMUNITY): Payer: Self-pay

## 2020-05-13 ENCOUNTER — Inpatient Hospital Stay (HOSPITAL_COMMUNITY)
Admission: EM | Admit: 2020-05-13 | Discharge: 2020-05-16 | DRG: 280 | Disposition: A | Discharge status: Home / Self Care | Payer: Medicare Other | Attending: Internal Medicine | Admitting: Internal Medicine

## 2020-05-13 DIAGNOSIS — I771 Stricture of artery: Secondary | ICD-10-CM | POA: Diagnosis present

## 2020-05-13 DIAGNOSIS — X58XXXA Exposure to other specified factors, initial encounter: Secondary | ICD-10-CM | POA: Diagnosis present

## 2020-05-13 DIAGNOSIS — I714 Abdominal aortic aneurysm, without rupture, unspecified: Secondary | ICD-10-CM | POA: Diagnosis present

## 2020-05-13 DIAGNOSIS — T17890A Other foreign object in other parts of respiratory tract causing asphyxiation, initial encounter: Secondary | ICD-10-CM | POA: Diagnosis present

## 2020-05-13 DIAGNOSIS — D72829 Elevated white blood cell count, unspecified: Secondary | ICD-10-CM | POA: Diagnosis present

## 2020-05-13 DIAGNOSIS — I214 Non-ST elevation (NSTEMI) myocardial infarction: Secondary | ICD-10-CM | POA: Diagnosis not present

## 2020-05-13 DIAGNOSIS — R9431 Abnormal electrocardiogram [ECG] [EKG]: Secondary | ICD-10-CM | POA: Diagnosis not present

## 2020-05-13 DIAGNOSIS — Z681 Body mass index (BMI) 19 or less, adult: Secondary | ICD-10-CM

## 2020-05-13 DIAGNOSIS — I5181 Takotsubo syndrome: Secondary | ICD-10-CM | POA: Diagnosis not present

## 2020-05-13 DIAGNOSIS — R0902 Hypoxemia: Secondary | ICD-10-CM | POA: Diagnosis not present

## 2020-05-13 DIAGNOSIS — I110 Hypertensive heart disease with heart failure: Secondary | ICD-10-CM | POA: Diagnosis present

## 2020-05-13 DIAGNOSIS — I712 Thoracic aortic aneurysm, without rupture: Secondary | ICD-10-CM | POA: Diagnosis not present

## 2020-05-13 DIAGNOSIS — F17210 Nicotine dependence, cigarettes, uncomplicated: Secondary | ICD-10-CM | POA: Diagnosis present

## 2020-05-13 DIAGNOSIS — D649 Anemia, unspecified: Secondary | ICD-10-CM | POA: Diagnosis present

## 2020-05-13 DIAGNOSIS — R911 Solitary pulmonary nodule: Secondary | ICD-10-CM | POA: Diagnosis not present

## 2020-05-13 DIAGNOSIS — R0689 Other abnormalities of breathing: Secondary | ICD-10-CM | POA: Diagnosis not present

## 2020-05-13 DIAGNOSIS — R0602 Shortness of breath: Secondary | ICD-10-CM | POA: Diagnosis not present

## 2020-05-13 DIAGNOSIS — Z79899 Other long term (current) drug therapy: Secondary | ICD-10-CM | POA: Diagnosis not present

## 2020-05-13 DIAGNOSIS — J9811 Atelectasis: Secondary | ICD-10-CM | POA: Diagnosis present

## 2020-05-13 DIAGNOSIS — I2510 Atherosclerotic heart disease of native coronary artery without angina pectoris: Secondary | ICD-10-CM | POA: Diagnosis present

## 2020-05-13 DIAGNOSIS — I252 Old myocardial infarction: Secondary | ICD-10-CM

## 2020-05-13 DIAGNOSIS — Z720 Tobacco use: Secondary | ICD-10-CM | POA: Diagnosis not present

## 2020-05-13 DIAGNOSIS — E781 Pure hyperglyceridemia: Secondary | ICD-10-CM | POA: Diagnosis present

## 2020-05-13 DIAGNOSIS — Z823 Family history of stroke: Secondary | ICD-10-CM | POA: Diagnosis not present

## 2020-05-13 DIAGNOSIS — I361 Nonrheumatic tricuspid (valve) insufficiency: Secondary | ICD-10-CM | POA: Diagnosis not present

## 2020-05-13 DIAGNOSIS — Z20822 Contact with and (suspected) exposure to covid-19: Secondary | ICD-10-CM | POA: Diagnosis present

## 2020-05-13 DIAGNOSIS — R079 Chest pain, unspecified: Secondary | ICD-10-CM

## 2020-05-13 DIAGNOSIS — I5021 Acute systolic (congestive) heart failure: Secondary | ICD-10-CM | POA: Diagnosis not present

## 2020-05-13 DIAGNOSIS — I701 Atherosclerosis of renal artery: Secondary | ICD-10-CM | POA: Diagnosis not present

## 2020-05-13 DIAGNOSIS — J9601 Acute respiratory failure with hypoxia: Secondary | ICD-10-CM | POA: Diagnosis present

## 2020-05-13 DIAGNOSIS — M549 Dorsalgia, unspecified: Secondary | ICD-10-CM | POA: Diagnosis not present

## 2020-05-13 DIAGNOSIS — R0600 Dyspnea, unspecified: Secondary | ICD-10-CM | POA: Diagnosis not present

## 2020-05-13 DIAGNOSIS — I708 Atherosclerosis of other arteries: Secondary | ICD-10-CM | POA: Diagnosis not present

## 2020-05-13 DIAGNOSIS — E785 Hyperlipidemia, unspecified: Secondary | ICD-10-CM | POA: Diagnosis present

## 2020-05-13 DIAGNOSIS — J80 Acute respiratory distress syndrome: Secondary | ICD-10-CM | POA: Diagnosis not present

## 2020-05-13 DIAGNOSIS — Z955 Presence of coronary angioplasty implant and graft: Secondary | ICD-10-CM | POA: Diagnosis not present

## 2020-05-13 DIAGNOSIS — R64 Cachexia: Secondary | ICD-10-CM | POA: Diagnosis present

## 2020-05-13 DIAGNOSIS — J441 Chronic obstructive pulmonary disease with (acute) exacerbation: Secondary | ICD-10-CM | POA: Diagnosis present

## 2020-05-13 DIAGNOSIS — I10 Essential (primary) hypertension: Secondary | ICD-10-CM | POA: Diagnosis present

## 2020-05-13 DIAGNOSIS — J449 Chronic obstructive pulmonary disease, unspecified: Secondary | ICD-10-CM | POA: Diagnosis not present

## 2020-05-13 HISTORY — DX: Non-ST elevation (NSTEMI) myocardial infarction: I21.4

## 2020-05-13 LAB — TROPONIN I (HIGH SENSITIVITY)
Troponin I (High Sensitivity): 75 ng/L — ABNORMAL HIGH (ref <18)
Troponin I (High Sensitivity): 422 ng/L (ref <18)
Troponin I (High Sensitivity): 675 ng/L (ref <18)
Troponin I (High Sensitivity): 956 ng/L (ref <18)

## 2020-05-13 LAB — CBC WITH DIFFERENTIAL/PLATELET
Abs Immature Granulocytes: 0.04 10*3/uL (ref 0.00–0.07)
Basophils Absolute: 0.1 10*3/uL (ref 0.0–0.1)
Basophils Relative: 1 %
Eosinophils Absolute: 0.3 10*3/uL (ref 0.0–0.5)
Eosinophils Relative: 3 %
HCT: 40.2 % (ref 36.0–46.0)
Hemoglobin: 12.7 g/dL (ref 12.0–15.0)
Immature Granulocytes: 0 %
Lymphocytes Relative: 29 %
Lymphs Abs: 3.2 10*3/uL (ref 0.7–4.0)
MCH: 31.1 pg (ref 26.0–34.0)
MCHC: 31.6 g/dL (ref 30.0–36.0)
MCV: 98.5 fL (ref 80.0–100.0)
Monocytes Absolute: 0.6 10*3/uL (ref 0.1–1.0)
Monocytes Relative: 5 %
Neutro Abs: 6.9 10*3/uL (ref 1.7–7.7)
Neutrophils Relative %: 62 %
Platelets: 152 10*3/uL (ref 150–400)
RBC: 4.08 MIL/uL (ref 3.87–5.11)
RDW: 13.1 % (ref 11.5–15.5)
WBC: 11.1 10*3/uL — ABNORMAL HIGH (ref 4.0–10.5)
nRBC: 0 % (ref 0.0–0.2)

## 2020-05-13 LAB — RESP PANEL BY RT-PCR (FLU A&B, COVID) ARPGX2
Influenza A by PCR: NEGATIVE
Influenza B by PCR: NEGATIVE
SARS Coronavirus 2 by RT PCR: NEGATIVE

## 2020-05-13 LAB — ECHOCARDIOGRAM COMPLETE
Area-P 1/2: 5.38 cm2
S' Lateral: 3.6 cm
Single Plane A4C EF: 12.4 %

## 2020-05-13 LAB — BASIC METABOLIC PANEL
Anion gap: 13 (ref 5–15)
BUN: 15 mg/dL (ref 8–23)
CO2: 21 mmol/L — ABNORMAL LOW (ref 22–32)
Calcium: 9 mg/dL (ref 8.9–10.3)
Chloride: 106 mmol/L (ref 98–111)
Creatinine, Ser: 0.87 mg/dL (ref 0.44–1.00)
GFR, Estimated: >60 mL/min (ref >60)
Glucose, Bld: 122 mg/dL — ABNORMAL HIGH (ref 70–99)
Potassium: 4.6 mmol/L (ref 3.5–5.1)
Sodium: 140 mmol/L (ref 135–145)

## 2020-05-13 LAB — HEPARIN LEVEL (UNFRACTIONATED): Heparin Unfractionated: 0.14 IU/mL — ABNORMAL LOW (ref 0.30–0.70)

## 2020-05-13 LAB — TSH: TSH: 1.11 u[IU]/mL (ref 0.350–4.500)

## 2020-05-13 LAB — BRAIN NATRIURETIC PEPTIDE: B Natriuretic Peptide: 81.4 pg/mL (ref 0.0–100.0)

## 2020-05-13 IMAGING — DX DG CHEST 1V PORT
1 series · 1 of 1 positions shown · non-contrast
Comparison: [DATE], CT [DATE]

CLINICAL DATA: Dyspnea

EXAM:
PORTABLE CHEST 1 VIEW

[chest]
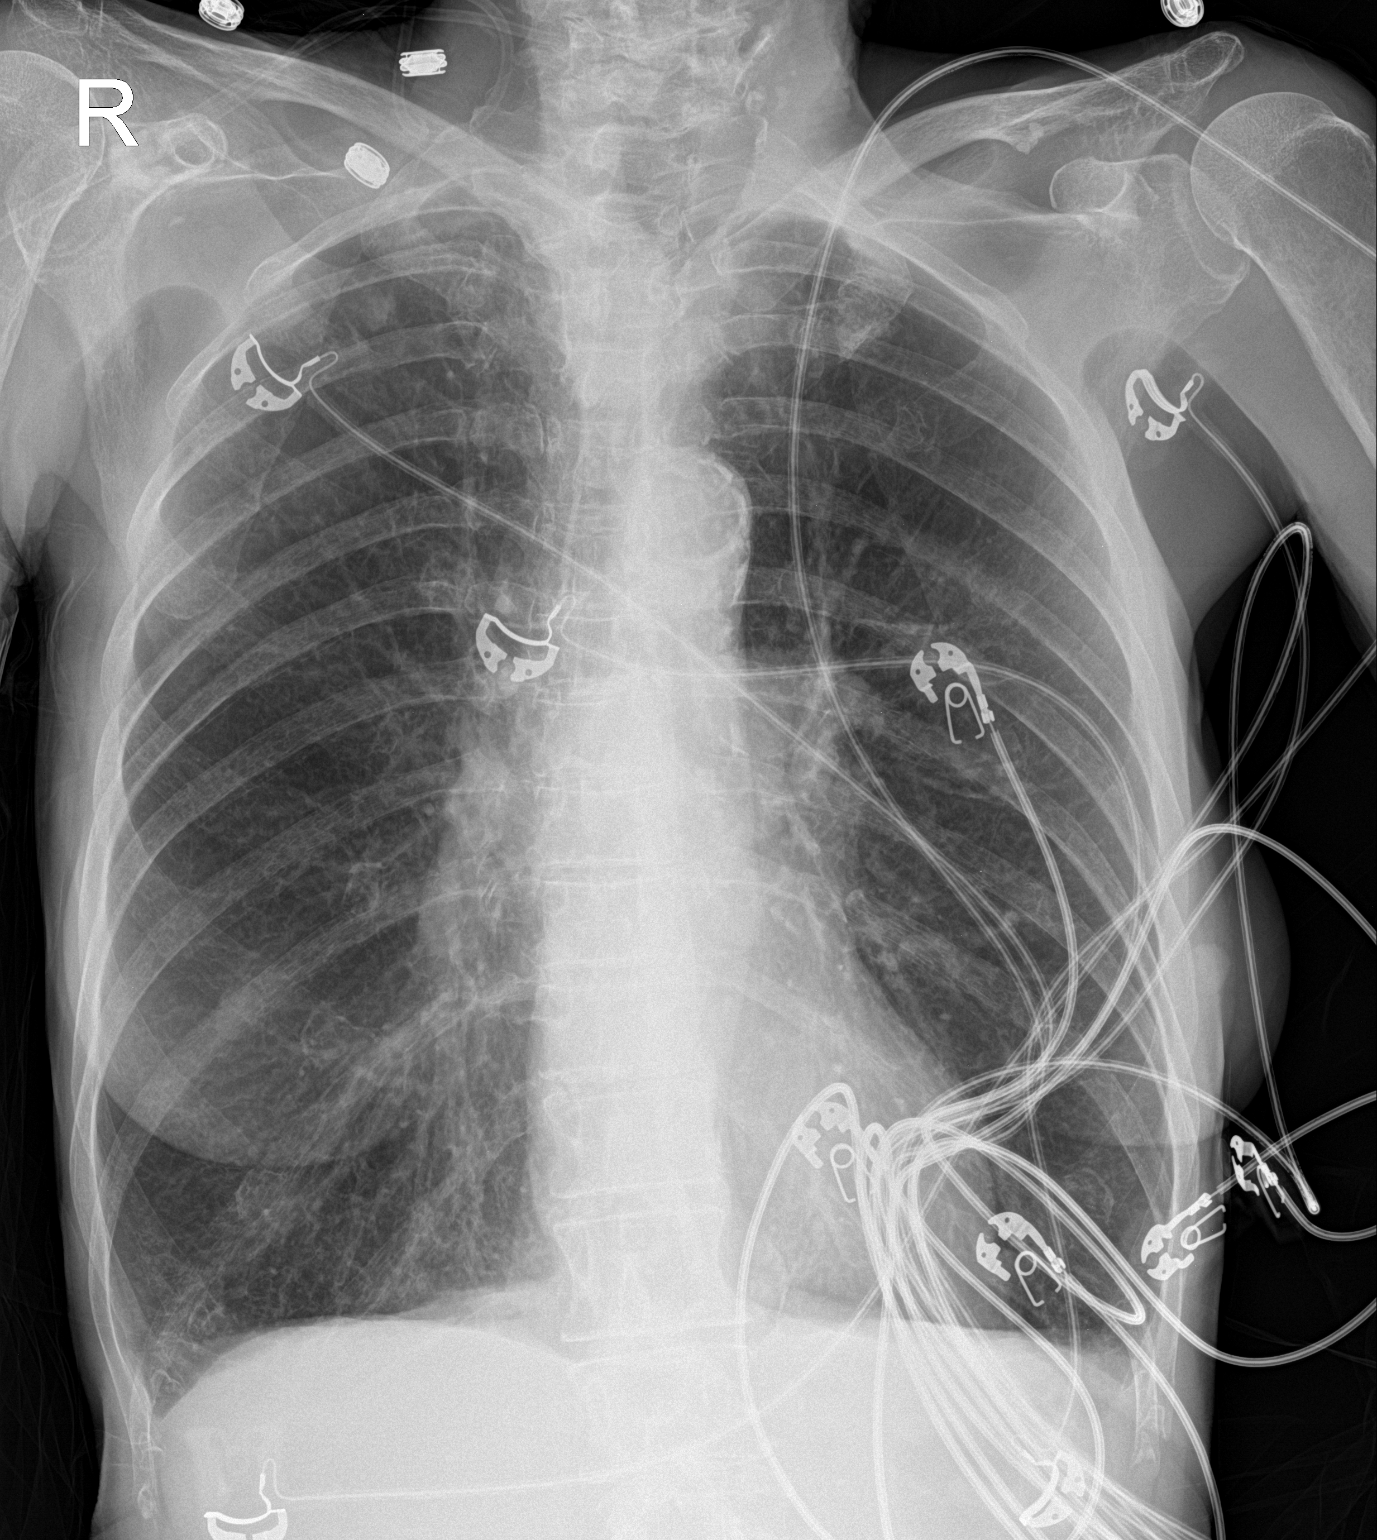

[1 of 1 positions shown; findings below may reference images not displayed]

FINDINGS: The lungs are symmetrically hyperinflated in keeping with changes of
underlying COPD. Right apical nodule is grossly unchanged from prior
CT examination,. No new focal pulmonary nodules or infiltrates.
Nipple shadows overlie the lung bases bilaterally. No pneumothorax
or pleural effusion. Cardiac size within normal limits. Pulmonary
vascularity is normal. No acute bone abnormality.
IMPRESSION: No active disease. COPD. Grossly stable right apical pulmonary
nodule.

## 2020-05-13 IMAGING — CT CT ANGIO CHEST-ABD-PELV FOR DISSECTION W/ AND WO/W CM
2 of 10 series · 13 of 46 positions shown, 15 images · non-contrast
Comparison: [DATE]. [DATE] chest CT and [DATE]
abdominal CT

CLINICAL DATA: Abdominal pain with concern for aortic dissection

EXAM:
CT ANGIOGRAPHY CHEST, ABDOMEN AND PELVIS
TECHNIQUE: Non-contrast CT of the chest was initially obtained.

[Series 9: dissection thins · axial · 0.61mm/px · z∈[+898,+1404]mm · 10 of 1181 slices shown, 12 images]
[im 113/1181  soft-tissue]
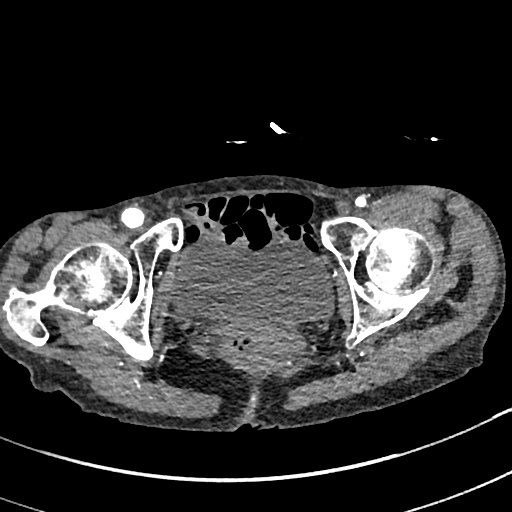
[im 113/1181  bone]
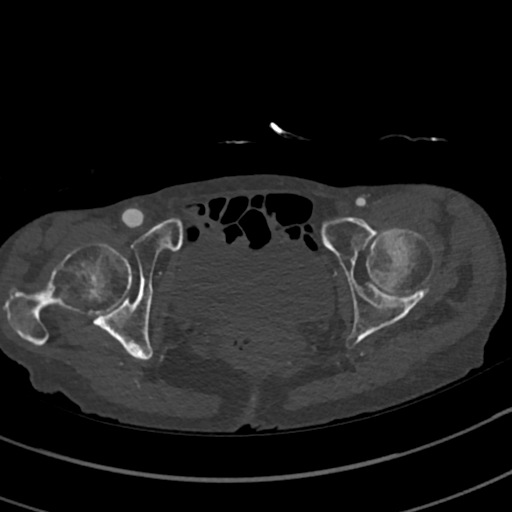
[im 225/1181  soft-tissue]
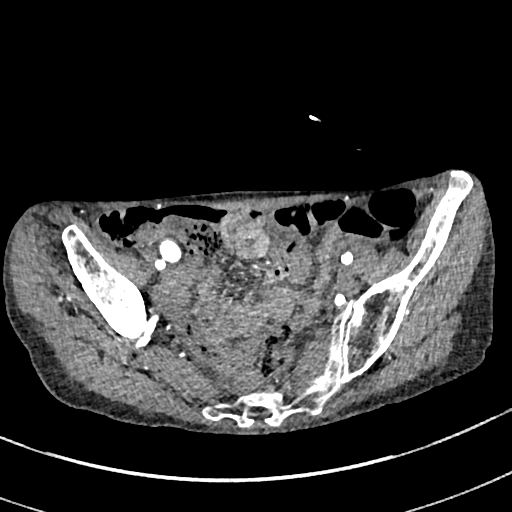
[im 338/1181  soft-tissue]
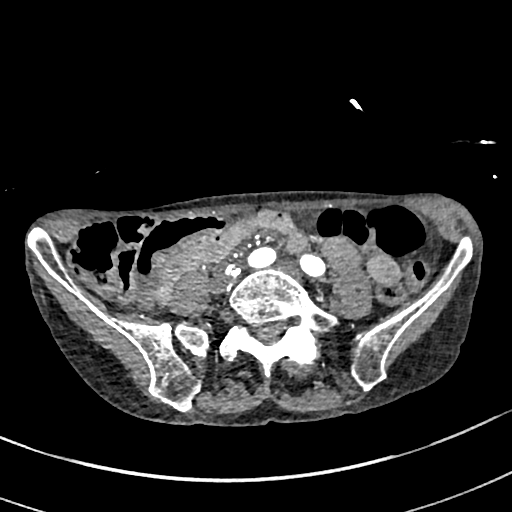
[im 450/1181  soft-tissue]
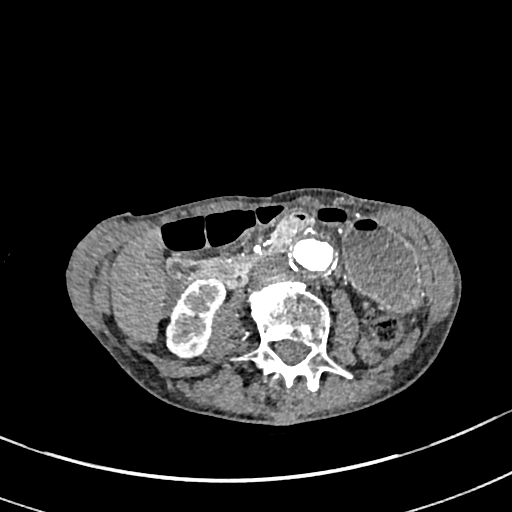
[im 562/1181  soft-tissue]
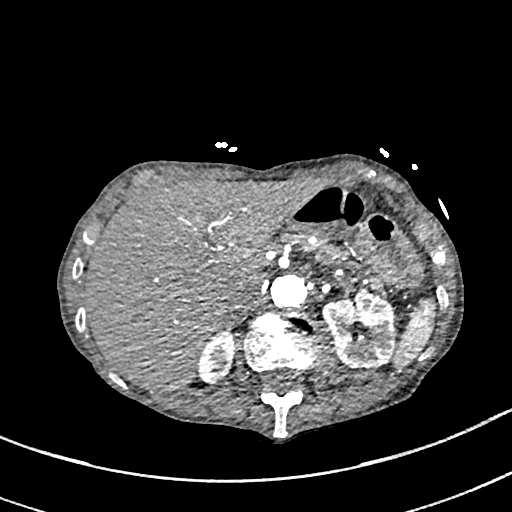
[im 675/1181  soft-tissue]
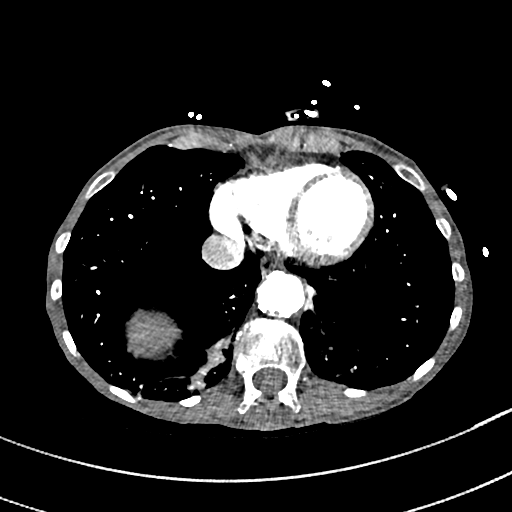
[im 787/1181  soft-tissue]
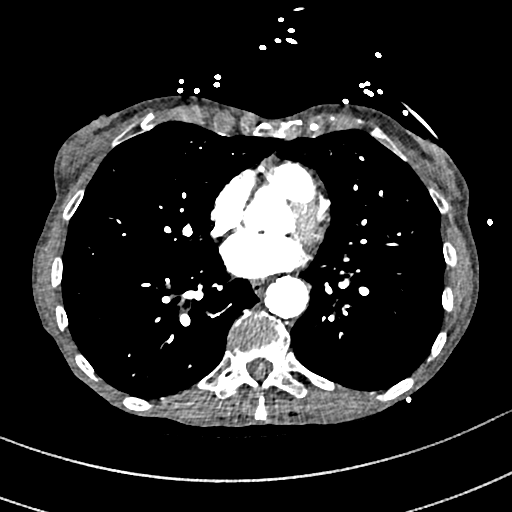
[im 900/1181  soft-tissue]
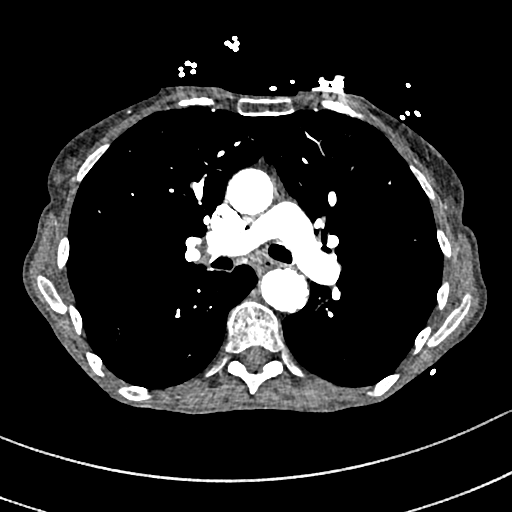
[im 1012/1181  soft-tissue]
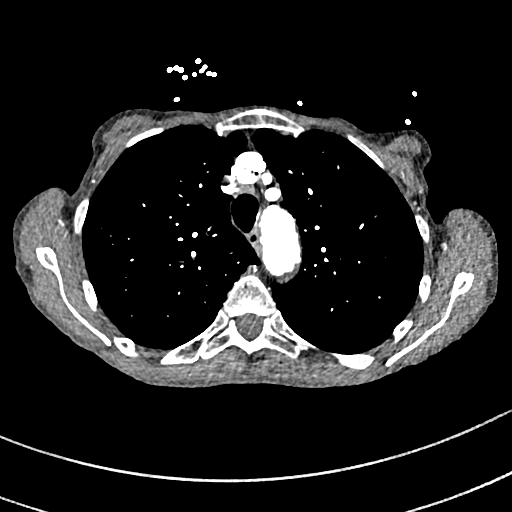
[im 1012/1181  bone]
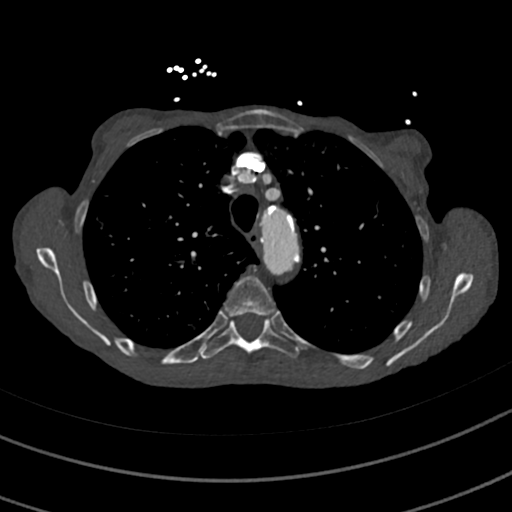
[im 1124/1181  soft-tissue]
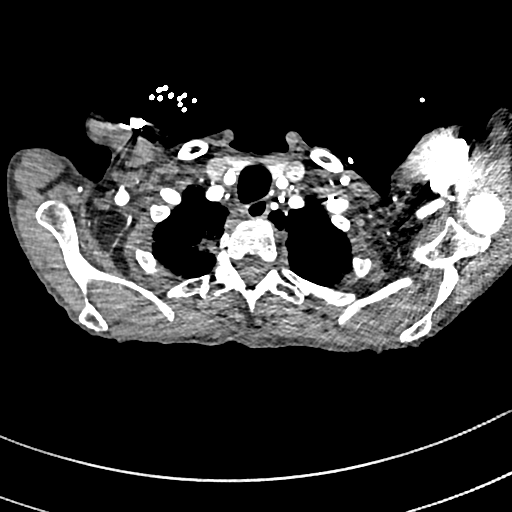

[Series 10: dissection 2mm cor · coronal · 0.60mm/px · 3 of 124 slices shown]
[im 31/124  soft-tissue]
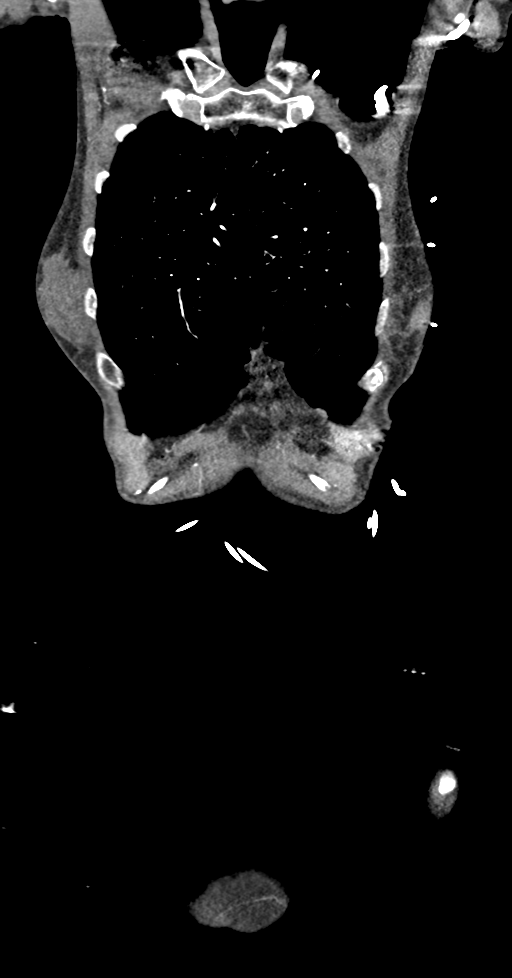
[im 62/124  soft-tissue]
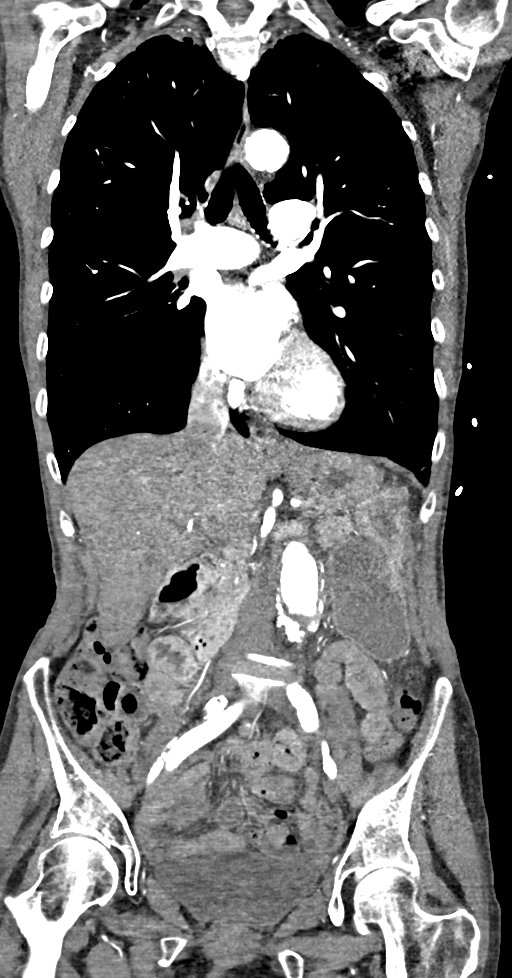
[im 93/124  soft-tissue]
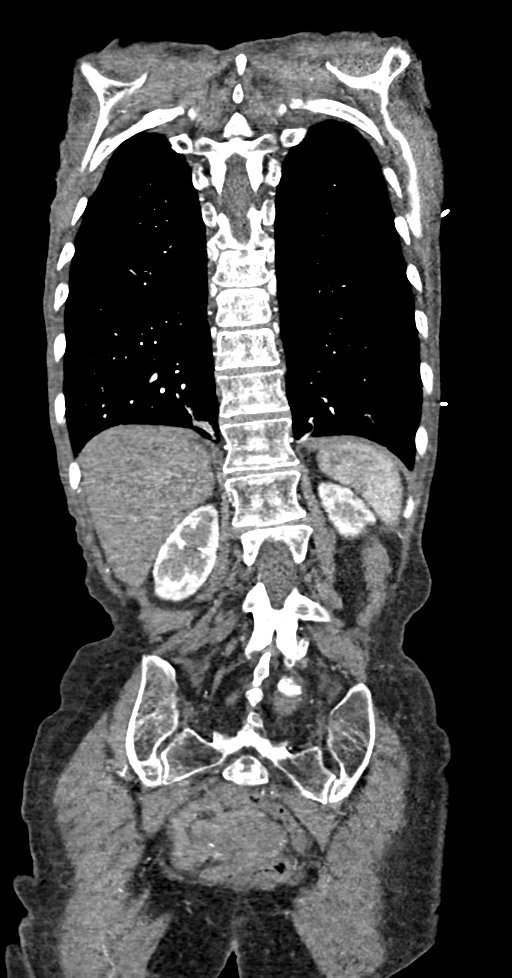

[13 of 46 positions shown; findings below may reference images not displayed]

Multidetector CT imaging through the chest, abdomen and pelvis was
performed using the standard protocol during bolus administration of
intravenous contrast. Multiplanar reconstructed images and MIPs were
obtained and reviewed to evaluate the vascular anatomy.

CONTRAST:  100mL OMNIPAQUE IOHEXOL 350 MG/ML SOLN
FINDINGS: CTA CHEST FINDINGS

Cardiovascular: No acute intramural hematoma on the noncontrast
phase. Normal heart size. No pericardial effusion. Severe
atheromatous disease of the aorta, great vessels, and coronaries. 36
mm diameter descending thoracic aorta just above the hiatus. This
includes a ventral predominant bulge which shows progressive mural
thrombus since prior. Additional notable rightward bulging just
below the isthmus with total diameter of 34 mm at this level, non
progressed from [MJ].

No pulmonary artery filling defect.

Mediastinum/Nodes: No hematoma or air leak

Lungs/Pleura: Airway plugging in the right lower lobe with mild
atelectasis and airspace opacity. Centrilobular emphysema with
hyperinflation. Chronic calcified nodule in the right upper lobe
attributed to old granulomatous disease.

Musculoskeletal: No acute finding

Review of the MIP images confirms the above findings.

CTA ABDOMEN AND PELVIS FINDINGS

VASCULAR

Aorta: Severe atherosclerosis. In [MJ] there was aortic aneurysm
repair with left iliac and right femoral grafting. Mural
high-density at the upper margin of the graft is attributed to
suture material. No evidence of leaking or adjacent aneurysmal
dilatation. Diffuse attenuation of the iliac arteries from
atherosclerosis. Bilateral inflow is patent.

Celiac: Over 50% stenosis at the celiac origin, atheromatous. No
acute occlusion or aneurysm.

SMA: Less than 50% atheromatous narrowing at the proximal SMA. No
branch occlusion, beading, or aneurysm.

Renals: Accessory right lower pole renal artery. Heavily calcified
left renal artery ostium with 50% stenosis by coronal reformats. No
acute finding.

IMA: Likely sacrificed.

Inflow: As above

Veins: Negative in the arterial phase

Review of the MIP images confirms the above findings.

NON-VASCULAR

Hepatobiliary: Vague high-density area in the central liver which is
likely perfusional.No evidence of biliary obstruction or stone.

Pancreas: Unremarkable.

Spleen: Unremarkable.

Adrenals/Urinary Tract: Negative adrenals. No hydronephrosis or
stone. Unremarkable bladder.

Stomach/Bowel:  No obstruction. No appendicitis.

Vascular/Lymphatic: No acute vascular abnormality. No mass or
adenopathy.

Reproductive:No pathologic findings.

Other: No ascites or pneumoperitoneum.

Musculoskeletal: No acute abnormalities. Advanced lumbar spine
degeneration with levoscoliosis and reversal of lumbar lordosis.

Review of the MIP images confirms the above findings.
IMPRESSION: 1. No acute vascular finding.
2. Descending thoracic aortic aneurysms measuring up to 3.6 cm in
diameter.
3. Treated abdominal aortic aneurysm. No interval or adverse
finding.
4. Aspiration or mucous plugging in the right lower lobe mild
atelectasis.
5. Aortic Atherosclerosis ([MJ]-[MJ]) and Emphysema ([MJ]-[MJ]),
both advanced.
6. Flow reducing stenosis at the celiac origin which is progressed

## 2020-05-13 MED ORDER — HEPARIN (PORCINE) 25000 UT/250ML-% IV SOLN
600.0000 [IU]/h | INTRAVENOUS | Status: DC
  Administered 2020-05-13: 450 [IU]/h via INTRAVENOUS
  Filled 2020-05-13: qty 250

## 2020-05-13 MED ORDER — UMECLIDINIUM BROMIDE 62.5 MCG/INH IN AEPB
1.0000 | INHALATION_SPRAY | Freq: Every day | RESPIRATORY_TRACT | Status: DC
  Administered 2020-05-13 – 2020-05-14 (×2): 1 via RESPIRATORY_TRACT
  Filled 2020-05-13: qty 7

## 2020-05-13 MED ORDER — CARVEDILOL 3.125 MG PO TABS
3.1250 mg | ORAL_TABLET | Freq: Two times a day (BID) | ORAL | Status: DC
  Administered 2020-05-14 – 2020-05-16 (×4): 3.125 mg via ORAL
  Filled 2020-05-13 (×7): qty 1

## 2020-05-13 MED ORDER — IOHEXOL 350 MG/ML SOLN
100.0000 mL | Freq: Once | INTRAVENOUS | Status: AC | PRN
  Administered 2020-05-13: 100 mL via INTRAVENOUS

## 2020-05-13 MED ORDER — HEPARIN BOLUS VIA INFUSION
1000.0000 [IU] | Freq: Once | INTRAVENOUS | Status: AC
  Administered 2020-05-13: 1000 [IU] via INTRAVENOUS
  Filled 2020-05-13: qty 1000

## 2020-05-13 MED ORDER — HEPARIN BOLUS VIA INFUSION
2100.0000 [IU] | Freq: Once | INTRAVENOUS | Status: AC
  Administered 2020-05-13: 2100 [IU] via INTRAVENOUS
  Filled 2020-05-13: qty 2100

## 2020-05-13 MED ORDER — ATORVASTATIN CALCIUM 80 MG PO TABS
80.0000 mg | ORAL_TABLET | Freq: Every day | ORAL | Status: DC
  Administered 2020-05-13 – 2020-05-16 (×3): 80 mg via ORAL
  Filled 2020-05-13: qty 2
  Filled 2020-05-13 (×2): qty 1

## 2020-05-13 MED ORDER — PERFLUTREN LIPID MICROSPHERE
1.0000 mL | INTRAVENOUS | Status: AC | PRN
  Administered 2020-05-13: 2 mL via INTRAVENOUS
  Filled 2020-05-13: qty 10

## 2020-05-13 MED ORDER — ALBUTEROL (5 MG/ML) CONTINUOUS INHALATION SOLN
10.0000 mg/h | INHALATION_SOLUTION | Freq: Once | RESPIRATORY_TRACT | Status: AC
  Administered 2020-05-13: 10 mg/h via RESPIRATORY_TRACT
  Filled 2020-05-13: qty 20

## 2020-05-13 MED ORDER — ASPIRIN EC 81 MG PO TBEC
81.0000 mg | DELAYED_RELEASE_TABLET | Freq: Every day | ORAL | Status: DC
  Administered 2020-05-15 – 2020-05-16 (×2): 81 mg via ORAL
  Filled 2020-05-13 (×3): qty 1

## 2020-05-13 MED ORDER — ASPIRIN 325 MG PO TABS
325.0000 mg | ORAL_TABLET | Freq: Once | ORAL | Status: AC
  Administered 2020-05-13: 325 mg via ORAL
  Filled 2020-05-13: qty 1

## 2020-05-13 NOTE — Progress Notes (Signed)
ANTICOAGULATION CONSULT NOTE - Initial Consult  Pharmacy Consult for Heparin Indication: chest pain/ACS  Allergies  Allergen Reactions  . No Known Allergies    Patient Measurements: Heparin Dosing Weight: 34 kg (estimated ~75 lbs)  Vital Signs: Temp: 97.4 F (36.3 C) (03/03 0157) Temp Source: Oral (03/03 0157) BP: 105/59 (03/03 1015) Pulse Rate: 93 (03/03 1015)  Labs: Recent Labs    05/13/20 0158 05/13/20 0358  HGB 12.7  --   HCT 40.2  --   PLT 152  --   CREATININE 0.87  --   TROPONINIHS 75* 422*    CrCl cannot be calculated (Unknown ideal weight.).   Medical History: Past Medical History:  Diagnosis Date  . AAA (abdominal aortic aneurysm) (HCC)    AAA  . COPD (chronic obstructive pulmonary disease) (HCC)   . Dyspnea    with exertion  . Pure hyperglyceridemia     Assessment: Pt is a 70 y/o female presenting with chest and back pain undergoing evaluation for ACS. Pt has a hx of AAA without current signs of dissection or ulceration. Pharmacy has been consulted for heparin while undergoing cardiac workup. Pt is not on anticoagulation PTA and BL CBC is WNL.   Goal of Therapy:  Heparin level 0.3-0.7 units/ml Monitor platelets by anticoagulation protocol: Yes   Plan:  Give 2100 units bolus x 1 Start heparin infusion at 450 units/hr Check heparin level in 6 hours. Daily CBC/heparin level while on heparin. Continue to monitor H&H and platelets  Levada Schilling PharmD Candidate 2022 05/13/2020 11:36 AM

## 2020-05-13 NOTE — ED Provider Notes (Signed)
[facility] EMERGENCY DEPARTMENT Provider Note   CSN: [id number] Arrival date & time: 05/13/20  0152     History Chief Complaint  Patient presents with  . Respiratory Distress    Alexandra Mays is a 70 y.o. female.  Patient presents to the emergency department for evaluation of respiratory distress.  Patient was awakened from sleep with shortness of breath approximately 2 hours ago.  Patient also complaining of chest pain and back pain.  EMS report that room air oxygen saturations were 84%.  She was given Solu-Medrol and bronchodilator treatment during transport, placed on nasal cannula oxygen.  Breathing has improved and her oxygen saturations are now 100%.        Past Medical History:  Diagnosis Date  . AAA (abdominal aortic aneurysm) (HCC)    AAA  . COPD (chronic obstructive pulmonary disease) (HCC)   . Dyspnea    with exertion  . Pure hyperglyceridemia     Patient Active Problem List   Diagnosis Date Noted  . NSTEMI (non-ST elevated myocardial infarction) (HCC) 05/13/2020  . S/P aortic aneurysm repair 07/24/2016  . AAA (abdominal aortic aneurysm) (HCC) 07/24/2016  . AAA (abdominal aortic aneurysm) without rupture (HCC) 04/21/2015  . Mild cognitive impairment with memory loss 02/17/2015  . Depressive disorder 02/17/2015    Past Surgical History:  Procedure Laterality Date  . ABDOMINAL AORTIC ANEURYSM REPAIR N/A 07/24/2016   Procedure: ABDOMINAL AORTIC ANEURYSM  REPAIR;  Surgeon: Sherren Kerns, MD;  Location: [facility] OR;  Service: Vascular;  Laterality: N/A;  . cardiac stenting  2007  . ENDARTERECTOMY Left 07/24/2016   Procedure: AORTA TO LEFT ILIAC ENDARTERECTOMY;  Surgeon: Sherren Kerns, MD;  Location: [facility] OR;  Service: Vascular;  Laterality: Left;  . ENDARTERECTOMY FEMORAL Right 07/24/2016   Procedure: AORTA TO RIGHT FEMORAL ENDARTERECTOMY;  Surgeon: Sherren Kerns, MD;  Location: [facility] OR;  Service: Vascular;  Laterality: Right;  . TOE  SURGERY Left    3 rd toe Hammer toe     OB History   No obstetric history on file.     Family History  Problem Relation Age of Onset  . Stroke Mother     Social History   Tobacco Use  . Smoking status: Current Every Day Smoker    Packs/day: 0.75    Years: 40.00    Pack years: 30.00    Types: Cigarettes  . Smokeless tobacco: Never Used  . Tobacco comment: 0.5ppd as of 10/31/19  Vaping Use  . Vaping Use: Never used  Substance Use Topics  . Alcohol use: No    Alcohol/week: 0.0 standard drinks  . Drug use: No    Home Medications Prior to Admission medications   Medication Sig Start Date End Date Taking? Authorizing Provider  albuterol (PROVENTIL HFA;VENTOLIN HFA) 108 (90 Base) MCG/ACT inhaler Inhale 2 puffs into the lungs every 6 (six) hours as needed for wheezing or shortness of breath.   Yes [provider]  albuterol (PROVENTIL) (2.5 MG/3ML) 0.083% nebulizer solution Take 3 mLs (2.5 mg total) by nebulization every 6 (six) hours as needed for wheezing or shortness of breath. 11/21/19  Yes Hunsucker, Lesia Sago, MD  BREO ELLIPTA 100-25 MCG/INH AEPB Inhale 1 puff into the lungs daily. 04/16/20  Yes [provider]  ibuprofen (ADVIL,MOTRIN) 200 MG tablet Take 800 mg by mouth every 6 (six) hours as needed for mild pain.   Yes [provider]  polyethylene glycol (MIRALAX / GLYCOLAX) packet Take 17 g  by mouth daily. Patient taking differently: Take 17 g by mouth daily as needed for mild constipation. 06/23/16  Yes Forest Becker, MD  simvastatin (ZOCOR) 20 MG tablet Take 20 mg by mouth every morning.    Yes [provider]  Tiotropium Bromide Monohydrate 2.5 MCG/ACT AERS Inhale 2 puffs into the lungs daily.   Yes [provider]    Allergies    No known allergies  Review of Systems   Review of Systems  Respiratory: Positive for shortness of breath.   Cardiovascular: Positive for chest pain.  Musculoskeletal: Positive for back pain.   All other systems reviewed and are negative.   Physical Exam Updated Vital Signs BP (!) 116/58   Pulse 78   Temp (!) 97.4 F (36.3 C) (Oral)   Resp 15   SpO2 96%   Physical Exam Vitals and nursing note reviewed.  Constitutional:      General: She is not in acute distress.    Appearance: Normal appearance. She is well-developed and well-nourished.  HENT:     Head: Normocephalic and atraumatic.     Right Ear: Hearing normal.     Left Ear: Hearing normal.     Nose: Nose normal.     Mouth/Throat:     Mouth: Oropharynx is clear and moist and mucous membranes are normal.  Eyes:     Extraocular Movements: EOM normal.     Conjunctiva/sclera: Conjunctivae normal.     Pupils: Pupils are equal, round, and reactive to light.  Cardiovascular:     Rate and Rhythm: Regular rhythm.     Heart sounds: S1 normal and S2 normal. No murmur heard. No friction rub. No gallop.   Pulmonary:     Effort: Tachypnea and accessory muscle usage present. No respiratory distress.     Breath sounds: Decreased breath sounds and rhonchi present.  Chest:     Chest wall: No tenderness.  Abdominal:     General: Bowel sounds are normal.     Palpations: Abdomen is soft. There is no hepatosplenomegaly.     Tenderness: There is no abdominal tenderness. There is no guarding or rebound. Negative signs include Murphy's sign and McBurney's sign.     Hernia: No hernia is present.  Musculoskeletal:        General: Normal range of motion.     Cervical back: Normal range of motion and neck supple.  Skin:    General: Skin is warm, dry and intact.     Findings: No rash.     Nails: There is no cyanosis.  Neurological:     Mental Status: She is alert and oriented to person, place, and time.     GCS: GCS eye subscore is 4. GCS verbal subscore is 5. GCS motor subscore is 6.     Cranial Nerves: No cranial nerve deficit.     Sensory: No sensory deficit.     Coordination: Coordination normal.     Deep Tendon Reflexes:  Strength normal.  Psychiatric:        Mood and Affect: Mood and affect normal.        Speech: Speech normal.        Behavior: Behavior normal.        Thought Content: Thought content normal.     ED Results / Procedures / Treatments   Labs (all labs ordered are listed, but only abnormal results are displayed) Labs Reviewed  CBC WITH DIFFERENTIAL/PLATELET - Abnormal; Notable for the following components:  Result Value   WBC 11.1 (*)    All other components within normal limits  BASIC METABOLIC PANEL - Abnormal; Notable for the following components:   CO2 21 (*)    Glucose, Bld 122 (*)    All other components within normal limits  HEPARIN LEVEL (UNFRACTIONATED) - Abnormal; Notable for the following components:   Heparin Unfractionated 0.14 (*)    All other components within normal limits  TROPONIN I (HIGH SENSITIVITY) - Abnormal; Notable for the following components:   Troponin I (High Sensitivity) 75 (*)    All other components within normal limits  TROPONIN I (HIGH SENSITIVITY) - Abnormal; Notable for the following components:   Troponin I (High Sensitivity) 422 (*)    All other components within normal limits  TROPONIN I (HIGH SENSITIVITY) - Abnormal; Notable for the following components:   Troponin I (High Sensitivity) 675 (*)    All other components within normal limits  TROPONIN I (HIGH SENSITIVITY) - Abnormal; Notable for the following components:   Troponin I (High Sensitivity) 956 (*)    All other components within normal limits  RESP PANEL BY RT-PCR (FLU A&B, COVID) ARPGX2  BRAIN NATRIURETIC PEPTIDE  TSH  HEPARIN LEVEL (UNFRACTIONATED)  CBC  LIPID PANEL  HEMOGLOBIN A1C  HEPARIN LEVEL (UNFRACTIONATED)    EKG EKG Interpretation  Date/Time:  Thursday May 13 2020 02:09:38 EST Ventricular Rate:  86 PR Interval:    QRS Duration: 82 QT Interval:  392 QTC Calculation: 469 R Axis:   85 Text Interpretation: Sinus rhythm Ventricular premature complex RAE,  consider biatrial enlargement Anterior infarct, old No significant change since last tracing Confirmed by Gilda Crease [phone]) on 05/13/2020 2:11:57 AM   Radiology DG Chest Port 1 View  Result Date: 05/13/2020 CLINICAL DATA:  Dyspnea EXAM: PORTABLE CHEST 1 VIEW COMPARISON:  07/25/2016, CT 12/14/2016 FINDINGS: The lungs are symmetrically hyperinflated in keeping with changes of underlying COPD. Right apical nodule is grossly unchanged from prior CT examination,. No new focal pulmonary nodules or infiltrates. Nipple shadows overlie the lung bases bilaterally. No pneumothorax or pleural effusion. Cardiac size within normal limits. Pulmonary vascularity is normal. No acute bone abnormality. IMPRESSION: No active disease. COPD. Grossly stable right apical pulmonary nodule. Electronically Signed   By: Helyn Numbers MD   On: 05/13/2020 02:21   ECHOCARDIOGRAM COMPLETE  Result Date: 05/13/2020    ECHOCARDIOGRAM REPORT   Patient Name:   ZOLLIE CLEMENCE Ransier Date of Exam: 05/13/2020 Medical Rec #:  [id number]         Height:       63.0 in Accession #:    [id number]        Weight:       80.0 lb Date of Birth:  18-Mar-1950          BSA:          1.310 m Patient Age:    69 years          BP:           109/66 mmHg Patient Gender: F                 HR:           87 bpm. Exam Location:  Inpatient Procedure: 2D Echo, Cardiac Doppler, Color Doppler and Intracardiac            Opacification Agent Indications:    R07.9* Chest pain, unspecified  History:        Patient has no  prior history of Echocardiogram examinations.                 Signs/Symptoms:Chest Pain, Shortness of Breath and Dyspnea.                 Aortic aneurysm repair.  Sonographer:    Sheralyn Boatman RDCS Referring Phys: [id number] [facility] NICOLE DUKE  Sonographer Comments: Technically difficult study due to poor echo windows. Extremely thin habitus, difficult angles. Interrupted multiple times. IMPRESSIONS  1. Left ventricular ejection fraction, by estimation, is 20 to  25%. The left ventricle has severely decreased function.  2. There is severe hypokinesis of all mid-to-apical LV segments. The basal segments have hyperdynamic contractility. 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  3. Left ventricular diastolic parameters are consistent with Grade I diastolic dysfunction (impaired relaxation).  4. Right ventricular systolic function is normal. The right ventricular size is normal.  5. The mitral valve is degenerative. Trivial mitral valve regurgitation.  6. The aortic valve was not well visualized. There is mild calcification of the aortic valve. There is mild thickening of the aortic valve. Aortic valve regurgitation is trivial.  7. The inferior vena cava is dilated in size with <50% respiratory variability, suggesting right atrial pressure of 15 mmHg.  8. Slow flow noted in the IVC. FINDINGS  Left Ventricle: Left ventricular ejection fraction, by estimation, is 20 to 25%. The left ventricle has severely decreased function. The left ventricle demonstrates regional wall motion abnormalities. Definity contrast agent was given IV to delineate the left ventricular endocardial borders. There is severe hypokinesis of all mid-to-apical LV segments. The basal segments have hyperdynamic contractility. 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 The left ventricular internal cavity size was normal in size. There is no left ventricular hypertrophy. Left ventricular diastolic parameters are consistent with Grade I diastolic dysfunction (impaired relaxation). Right Ventricle: The right ventricular size is normal. Right vetricular wall thickness was not well visualized. Right ventricular systolic function is normal. Left Atrium: Left atrial size was normal in size. Right Atrium: Right atrial size was normal in size. Pericardium: There is no evidence of pericardial effusion. Mitral Valve: The mitral valve is degenerative in  appearance. Trivial mitral valve regurgitation. Tricuspid Valve: The tricuspid valve is normal in structure. Tricuspid valve regurgitation is mild. Aortic Valve: The aortic valve was not well visualized. There is mild calcification of the aortic valve. There is mild thickening of the aortic valve. Aortic valve regurgitation is trivial. Pulmonic Valve: The pulmonic valve was normal in structure. Pulmonic valve regurgitation is trivial. Aorta: Aortic root could not be assessed and the aortic root is normal in size and structure. Venous: The inferior vena cava is dilated in size with less than 50% respiratory variability, suggesting right atrial pressure of 15 mmHg. IAS/Shunts: No atrial level shunt detected by color flow Doppler.  LEFT VENTRICLE PLAX 2D LVIDd:         3.80 cm     Diastology LVIDs:         3.60 cm     LV e' medial:    4.46 cm/s LV PW:         1.00 cm     LV E/e' medial:  13.4 LV IVS:        1.00 cm     LV e' lateral:   4.57 cm/s LVOT diam:     1.80 cm     LV E/e' lateral: 13.1 LV SV:  25 LV SV Index:   19 LVOT Area:     2.54 cm  LV Volumes (MOD) LV vol d, MOD A4C: 65.2 ml LV vol s, MOD A4C: 57.1 ml LV SV MOD A4C:     65.2 ml RIGHT VENTRICLE             IVC RV S prime:     11.10 cm/s  IVC diam: 1.70 cm TAPSE (M-mode): 1.6 cm LEFT ATRIUM             Index      RIGHT ATRIUM          Index LA diam:        2.10 cm 1.60 cm/m RA Area:     6.93 cm LA Vol (A2C):   10.8 ml 8.24 ml/m RA Volume:   12.20 ml 9.31 ml/m LA Vol (A4C):   11.0 ml 8.40 ml/m LA Biplane Vol: 11.7 ml 8.93 ml/m  AORTIC VALVE LVOT Vmax:   65.00 cm/s LVOT Vmean:  40.800 cm/s LVOT VTI:    0.100 m  AORTA Ao Root diam: 2.80 cm MITRAL VALVE MV Area (PHT): 5.38 cm    SHUNTS MV Decel Time: 141 msec    Systemic VTI:  0.10 m MV E velocity: 59.70 cm/s  Systemic Diam: 1.80 cm MV A velocity: 77.10 cm/s MV E/A ratio:  0.77 Laurance Flatten MD Electronically signed by Laurance Flatten MD Signature Date/Time: 05/13/2020/2:48:10 PM    Final     CT ANGIO CHEST/ABD/PEL FOR DISSECTION W &/OR WO CONTRAST  Result Date: 05/13/2020 CLINICAL DATA:  Abdominal pain with concern for aortic dissection EXAM: CT ANGIOGRAPHY CHEST, ABDOMEN AND PELVIS TECHNIQUE: Non-contrast CT of the chest was initially obtained. Multidetector CT imaging through the chest, abdomen and pelvis was performed using the standard protocol during bolus administration of intravenous contrast. Multiplanar reconstructed images and MIPs were obtained and reviewed to evaluate the vascular anatomy. CONTRAST:  OMNIPAQUE IOHEXOL 350 MG/ML SOLN COMPARISON:  06/23/2016. 12/14/2016 chest CT and 08/15/2016 abdominal CT FINDINGS: CTA CHEST FINDINGS Cardiovascular: No acute intramural hematoma on the noncontrast phase. Normal heart size. No pericardial effusion. Severe atheromatous disease of the aorta, great vessels, and coronaries. 36 mm diameter descending thoracic aorta just above the hiatus. This includes a ventral predominant bulge which shows progressive mural thrombus since prior. Additional notable rightward bulging just below the isthmus with total diameter of 34 mm at this level, non progressed from 2018. No pulmonary artery filling defect. Mediastinum/Nodes: No hematoma or air leak Lungs/Pleura: Airway plugging in the right lower lobe with mild atelectasis and airspace opacity. Centrilobular emphysema with hyperinflation. Chronic calcified nodule in the right upper lobe attributed to old granulomatous disease. Musculoskeletal: No acute finding Review of the MIP images confirms the above findings. CTA ABDOMEN AND PELVIS FINDINGS VASCULAR Aorta: Severe atherosclerosis. In 2018 there was aortic aneurysm repair with left iliac and right femoral grafting. Mural high-density at the upper margin of the graft is attributed to suture material. No evidence of leaking or adjacent aneurysmal dilatation. Diffuse attenuation of the iliac arteries from atherosclerosis. Bilateral inflow is patent.  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 No acute occlusion or aneurysm. SMA: Less than 50% atheromatous narrowing at the proximal SMA. No branch occlusion, beading, or aneurysm. Renals: Accessory right lower pole renal artery. Heavily calcified left renal artery ostium with 50% stenosis by coronal reformats. No acute finding. IMA: Likely sacrificed. Inflow: As above Veins: Negative in the arterial phase Review of the  MIP images confirms the above findings. NON-VASCULAR Hepatobiliary: Vague high-density area in the central liver which is likely perfusional.No evidence of biliary obstruction or stone. Pancreas: Unremarkable. Spleen: Unremarkable. Adrenals/Urinary Tract: Negative adrenals. No hydronephrosis or stone. Unremarkable bladder. Stomach/Bowel:  No obstruction. No appendicitis. Vascular/Lymphatic: No acute vascular abnormality. No mass or adenopathy. Reproductive:No pathologic findings. Other: No ascites or pneumoperitoneum. Musculoskeletal: No acute abnormalities. Advanced lumbar spine degeneration with levoscoliosis and reversal of lumbar lordosis. Review of the MIP images confirms the above findings. IMPRESSION: 1. No acute vascular finding. 2. Descending thoracic aortic aneurysms measuring up to 3.6 cm in diameter. 3. Treated abdominal aortic aneurysm. No interval or adverse finding. 4. Aspiration or mucous plugging in the right lower lobe mild atelectasis. 5. Aortic Atherosclerosis (ICD10-I70.0) and Emphysema (ICD10-J43.9), both advanced. 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 Electronically Signed   By: Marnee Spring M.D.   On: 05/13/2020 08:50    Procedures Procedures   Medications Ordered in ED Medications  umeclidinium bromide (INCRUSE ELLIPTA) 62.5 MCG/INH 1 puff (1 puff Inhalation Given 05/13/20 1014)  heparin ADULT infusion 100 units/mL (25000 units/263mL) (600 Units/hr Intravenous Rate/Dose Change 05/13/20 1954)  aspirin tablet  325 mg (325 mg Oral Given 05/13/20 1343)    Followed by  aspirin EC tablet 81 mg (has no administration in time range)  carvedilol (COREG) tablet 3.125 mg (3.125 mg Oral Not Given 05/13/20 1611)  perflutren lipid microspheres (DEFINITY) IV suspension (2 mLs Intravenous Given 05/13/20 1234)  atorvastatin (LIPITOR) tablet 80 mg (80 mg Oral Given 05/13/20 1340)  albuterol (PROVENTIL,VENTOLIN) solution continuous neb (10 mg/hr Nebulization Given 05/13/20 0329)  iohexol (OMNIPAQUE) 350 MG/ML injection 100 mL (100 mLs Intravenous Contrast Given 05/13/20 0500)  heparin bolus via infusion 2,100 Units (2,100 Units Intravenous Bolus from Bag 05/13/20 1204)  heparin bolus via infusion 1,000 Units (1,000 Units Intravenous Bolus from Bag 05/13/20 1954)    ED Course  I have reviewed the triage vital signs and the nursing notes.  Pertinent labs & imaging results that were available during my care of the patient were reviewed by me and considered in my medical decision making (see chart for details).    MDM Rules/Calculators/A&P                          Patient presents to the emergency department for evaluation of respiratory distress.  Patient does have a history of COPD, is not oxygen dependent.  Patient had oxygen saturations of 84% upon arrival of EMS.  Patient treated with Solu-Medrol and breathing treatments during transport, has improved.  She is currently on 2 L by nasal cannula with adequate oxygen saturations.  Wheezing has diminished after additional continuous nebulizer treatment here in the department.  Patient does have a history of repaired AAA.  She has been experiencing chest pain and back pain.  She reports that her back pain is chronic.  Will perform CT angiography of aorta to rule out pathology.  Patient does also have a history of MI and stenting.  EKG did not show obvious ischemia or infarct.  First troponin is elevated.  This could simply be leak from her hypoxia, will need to see if second troponin  is significantly elevated.  Patient will require hospitalization for further management of COPD exacerbation, respiratory failure with hypoxia and mildly elevated troponin. Final Clinical Impression(s) / ED Diagnoses Final diagnoses:  COPD exacerbation (HCC)    Rx / DC Orders  ED Discharge Orders    None       Deanie Jupiter, Canary Brimhristopher J, MD 05/14/20 0130

## 2020-05-13 NOTE — Progress Notes (Signed)
ANTICOAGULATION CONSULT NOTE - Initial Consult  Pharmacy Consult for Heparin Indication: chest pain/ACS  Allergies  Allergen Reactions  . No Known Allergies    Patient Measurements: Heparin Dosing Weight: 34 kg (estimated ~75 lbs)  Vital Signs: BP: 111/63 (03/03 1930) Pulse Rate: 78 (03/03 1930)  Labs: Recent Labs    05/13/20 0158 05/13/20 0358 05/13/20 1103 05/13/20 1328 05/13/20 1800  HGB 12.7  --   --   --   --   HCT 40.2  --   --   --   --   PLT 152  --   --   --   --   HEPARINUNFRC  --   --   --   --  0.14*  CREATININE 0.87  --   --   --   --   TROPONINIHS 75* 422* 675* 956*  --     CrCl cannot be calculated (Unknown ideal weight.).   Medical History: Past Medical History:  Diagnosis Date  . AAA (abdominal aortic aneurysm) (HCC)    AAA  . COPD (chronic obstructive pulmonary disease) (HCC)   . Dyspnea    with exertion  . Pure hyperglyceridemia     Assessment: Pt is a 70 y/o female presenting with chest and back pain undergoing evaluation for ACS. Pt has a hx of AAA without current signs of dissection or ulceration. Pharmacy has been consulted for heparin while undergoing cardiac workup.  Patient's heparin level is subtherapeutic at 0.14 on 450 units/hr. Baseline CBC WNL.   Goal of Therapy:  Heparin level 0.3-0.7 units/ml Monitor platelets by anticoagulation protocol: Yes   Plan:  Bolus heparin 1000 units x1, followed by increase heparin infusion to 600 units/hr 6 hr heparin level and daily heparin level, CBC Monitor s/s bleeding  Kinnie Feil, PharmD PGY1 Acute Care Pharmacy Resident 05/13/2020 7:41 PM  Please check [email or web address] for unit specific pharmacy phone numbers.

## 2020-05-13 NOTE — ED Triage Notes (Signed)
Per ems they were called out for respiratory distress. Pt was 84% on room air. Pt has COPD. With 2L [place] pt sats came up to 92%. Ems gave duoneb/solumedrol/albuteral tx. Pt came up to 100% after medications on 2L. Pt also complaining of pain all over. Alert and oriented x4.

## 2020-05-13 NOTE — ED Notes (Signed)
Grand daughter updated on patients plan of care.

## 2020-05-13 NOTE — ED Provider Notes (Signed)
70 yo female ho copd presents with sob, cough, sats 80% at home. Chest pain and back pain with ho AAA Pending cta First troponin 75, repeat pending EKG without acute changes Physical Exam  BP 99/63   Pulse 86   Temp (!) 97.4 F (36.3 C) (Oral)   Resp 20   SpO2 97%   Physical Exam  ED Course/Procedures    9:45 AM Reviewed CTA chest, abdomen pelvis, Discussed with Dr. Chestine Spore He advises that given size of thoracic aneurysm, feels it is very unlikely for this to be causing symptoms.  Additionally sees no evidence of focal dissection or ulceration. Query regarding heparin if cardiology feels that this is needed for possible ACS and advises to proceed with heparin if indicated  .Critical Care Performed by: Margarita Grizzle, MD Authorized by: Margarita Grizzle, MD   Critical care provider statement:    Critical care time (minutes):  45   Critical care was necessary to treat or prevent imminent or life-threatening deterioration of the following conditions:  Circulatory failure and respiratory failure   Critical care was time spent personally by me on the following activities:  Discussions with consultants, evaluation of patient's response to treatment, examination of patient, ordering and performing treatments and interventions, ordering and review of laboratory studies, ordering and review of radiographic studies, pulse oximetry, re-evaluation of patient's condition, obtaining history from patient or surrogate and review of old charts    MDM  Admit   8:47 AM Repeat troponin elevated at 422 Awaiting cta 9:10 AM Reviewed ct reading- DG Chest Port 1 View  Result Date: 05/13/2020 CLINICAL DATA:  Dyspnea EXAM: PORTABLE CHEST 1 VIEW COMPARISON:  07/25/2016, CT 12/14/2016 FINDINGS: The lungs are symmetrically hyperinflated in keeping with changes of underlying COPD. Right apical nodule is grossly unchanged from prior CT examination,. No new focal pulmonary nodules or infiltrates. Nipple shadows  overlie the lung bases bilaterally. No pneumothorax or pleural effusion. Cardiac size within normal limits. Pulmonary vascularity is normal. No acute bone abnormality. IMPRESSION: No active disease. COPD. Grossly stable right apical pulmonary nodule. Electronically Signed   By: Helyn Numbers MD   On: 05/13/2020 02:21   CT ANGIO CHEST/ABD/PEL FOR DISSECTION W &/OR WO CONTRAST  Result Date: 05/13/2020 CLINICAL DATA:  Abdominal pain with concern for aortic dissection EXAM: CT ANGIOGRAPHY CHEST, ABDOMEN AND PELVIS TECHNIQUE: Non-contrast CT of the chest was initially obtained. Multidetector CT imaging through the chest, abdomen and pelvis was performed using the standard protocol during bolus administration of intravenous contrast. Multiplanar reconstructed images and MIPs were obtained and reviewed to evaluate the vascular anatomy. CONTRAST:  OMNIPAQUE IOHEXOL 350 MG/ML SOLN COMPARISON:  06/23/2016. 12/14/2016 chest CT and 08/15/2016 abdominal CT FINDINGS: CTA CHEST FINDINGS Cardiovascular: No acute intramural hematoma on the noncontrast phase. Normal heart size. No pericardial effusion. Severe atheromatous disease of the aorta, great vessels, and coronaries. 36 mm diameter descending thoracic aorta just above the hiatus. This includes a ventral predominant bulge which shows progressive mural thrombus since prior. Additional notable rightward bulging just below the isthmus with total diameter of 34 mm at this level, non progressed from 2018. No pulmonary artery filling defect. Mediastinum/Nodes: No hematoma or air leak Lungs/Pleura: Airway plugging in the right lower lobe with mild atelectasis and airspace opacity. Centrilobular emphysema with hyperinflation. Chronic calcified nodule in the right upper lobe attributed to old granulomatous disease. Musculoskeletal: No acute finding Review of the MIP images confirms the above findings. CTA ABDOMEN AND PELVIS FINDINGS VASCULAR Aorta: Severe atherosclerosis.  In  2018 there was aortic aneurysm repair with left iliac and right femoral grafting. Mural high-density at the upper margin of the graft is attributed to suture material. No evidence of leaking or adjacent aneurysmal dilatation. Diffuse attenuation of the iliac arteries from atherosclerosis. Bilateral inflow is patent. 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 No acute occlusion or aneurysm. SMA: Less than 50% atheromatous narrowing at the proximal SMA. No branch occlusion, beading, or aneurysm. Renals: Accessory right lower pole renal artery. Heavily calcified left renal artery ostium with 50% stenosis by coronal reformats. No acute finding. IMA: Likely sacrificed. Inflow: As above Veins: Negative in the arterial phase Review of the MIP images confirms the above findings. NON-VASCULAR Hepatobiliary: Vague high-density area in the central liver which is likely perfusional.No evidence of biliary obstruction or stone. Pancreas: Unremarkable. Spleen: Unremarkable. Adrenals/Urinary Tract: Negative adrenals. No hydronephrosis or stone. Unremarkable bladder. Stomach/Bowel:  No obstruction. No appendicitis. Vascular/Lymphatic: No acute vascular abnormality. No mass or adenopathy. Reproductive:No pathologic findings. Other: No ascites or pneumoperitoneum. Musculoskeletal: No acute abnormalities. Advanced lumbar spine degeneration with levoscoliosis and reversal of lumbar lordosis. Review of the MIP images confirms the above findings. IMPRESSION: 1. No acute vascular finding. 2. Descending thoracic aortic aneurysms measuring up to 3.6 cm in diameter. 3. Treated abdominal aortic aneurysm. No interval or adverse finding. 4. Aspiration or mucous plugging in the right lower lobe mild atelectasis. 5. Aortic Atherosclerosis (ICD10-I70.0) and Emphysema (ICD10-J43.9), both advanced. 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 Electronically Signed   By: Marnee Spring M.D.   On:  05/13/2020 08:50    10:51 AM Discussed with cardiology We'll start heparin Will check pain level and consider nitro  Chest pain/dyspnea- now pain free Heparin being dosed by pharmacy Patient with multiple possible etiologies of chest pain and dyspnea 1 thoracic aneurysm- discussed with vascular surgery and unlikely source of symptoms -will need ongoing monitoring 2- cardiac- no acutely ischemic ekg changes.  Troponin trending up.  Likely nstemi.  Patient currently pain free.,  Troponin 422>75 3- pe- doubt with negative cta 4- copd- could be source of dyspnea, but no wheezing on exam and does not explain pain     Margarita Grizzle, MD 05/13/20 [phone]

## 2020-05-13 NOTE — ED Notes (Signed)
Pt resting, denies any complaints. Requested water, MD to approve.

## 2020-05-13 NOTE — ED Notes (Signed)
ECHO being done at bedside.

## 2020-05-13 NOTE — ED Provider Notes (Signed)
22: 15 charge nurse has just made me aware that the patient was seen by internal medicine teaching service but, reportedly, was to be admitted to cardiology service.  There have been no orders placed for admission.  I will call cardiology to clarify admission plan.  22:22 reviewed with Dr.Akhter.  Note is from cardiology.  He will put in bed assignment orders.  There are an released orders for cath for tomorrow.  This should clarify the matter. Physical Exam  BP 103/73   Pulse 76   Temp (!) 97.4 F (36.3 C) (Oral)   Resp 17   SpO2 92%   Physical Exam  ED Course/Procedures     Procedures  MDM         Arby Barrette, MD 05/13/20 2223

## 2020-05-13 NOTE — Progress Notes (Signed)
  Echocardiogram 2D Echocardiogram has been performed.  Janalyn Harder 05/13/2020, 12:35 PM

## 2020-05-13 NOTE — Consult Note (Signed)
Consult Note  Patient Name: Alexandra Mays Date of Encounter: 05/13/2020  Primary Cardiologist: None  Subjective   [name] is a 70 year old female with PMHx of CAD s/p stent in 2007, COPD not on oxygen at baseline, tobacco use disorder presenting with acute onset shortness of breath and chest pain. She notes that she was in her usual state of health until overnight when she awoke from sleep and noted to have shortness of breath. Endorses associated substernal nonradiating chest pain that felt stabbing in nature. She called EMS for this. She was noted to be hypoxic to 84% for which she was given SoluMedrol and bronchodilator treatment and started on oxygen with improvement in symptoms.   In the ED, patient received additoinal continuous nebulizer treatment. CXR without any significant findings. CTA chest/abdomen/pelvis obtained which was significant for descending thoracic aortic aneurysm up to 3.6cm, previously treated abdominal aortic aneurysm, and aspiration/mucous plugging in RLL with mild atelectasis. No significant leukocytosis. BNP wnl. Troponin [id number]. Cardiology consulted.   Inpatient Medications    Scheduled Meds: . umeclidinium bromide  1 puff Inhalation Daily   Continuous Infusions:  PRN Meds:    Vital Signs    Vitals:   05/13/20 0915 05/13/20 0945 05/13/20 1015 05/13/20 1100  BP: 104/66 105/63 (!) 105/59 109/66  Pulse: 86 80 93 84  Resp: 16 16 17 17   Temp:      TempSrc:      SpO2: 98% 99% 99% 99%   No intake or output data in the 24 hours ending 05/13/20 1133 There were no vitals filed for this visit.  Telemetry    Normal sinus rhythm with frequent PVCs, HR in 80-90s - Personally Reviewed  ECG    NSR with prior anterior infarct, No acute ST segment elevations noted; HR 86- Personally Reviewed  Physical Exam  Physical Exam  Constitutional: Cachectic, chronically ill appearing female, no acute distress  HENT: Normocephalic and atraumatic,  EOMI, conjunctiva normal, moist mucous membranes, no JVD Cardiovascular: Normal rate, regular rhythm, S1 and S2 present, no murmurs, rubs, gallops.  Distal pulses intact Respiratory: No respiratory distress, no accessory muscle use.  Effort is normal.  Lungs are clear to auscultation bilaterally. On 2L [place] GI: Nondistended, soft, nontender to palpation, active bowel sounds Musculoskeletal: Normal bulk and tone.  No peripheral edema noted. Neurological: Is alert and oriented x4, no apparent focal deficits noted. Skin: Warm and dry.  No rash, erythema, lesions noted. Psychiatric: Normal mood and affect. Behavior is normal. Judgment and thought content normal.   Labs    Chemistry Recent Labs  Lab 05/13/20 0158  NA 140  K 4.6  CL 106  CO2 21*  GLUCOSE 122*  BUN 15  CREATININE 0.87  CALCIUM 9.0  GFRNONAA >60  ANIONGAP 13     Hematology Recent Labs  Lab 05/13/20 0158  WBC 11.1*  RBC 4.08  HGB 12.7  HCT 40.2  MCV 98.5  MCH 31.1  MCHC 31.6  RDW 13.1  PLT 152    Cardiac EnzymesNo results for input(s): TROPONINI in the last 168 hours. No results for input(s): TROPIPOC in the last 168 hours.   BNP Recent Labs  Lab 05/13/20 0158  BNP 81.4     DDimer No results for input(s): DDIMER in the last 168 hours.   Radiology    DG Chest Port 1 View  Result Date: 05/13/2020 CLINICAL DATA:  Dyspnea EXAM: PORTABLE CHEST 1 VIEW COMPARISON:  07/25/2016, CT 12/14/2016 FINDINGS: The lungs  are symmetrically hyperinflated in keeping with changes of underlying COPD. Right apical nodule is grossly unchanged from prior CT examination,. No new focal pulmonary nodules or infiltrates. Nipple shadows overlie the lung bases bilaterally. No pneumothorax or pleural effusion. Cardiac size within normal limits. Pulmonary vascularity is normal. No acute bone abnormality. IMPRESSION: No active disease. COPD. Grossly stable right apical pulmonary nodule. Electronically Signed   By: Helyn Numbers MD   On:  05/13/2020 02:21   CT ANGIO CHEST/ABD/PEL FOR DISSECTION W &/OR WO CONTRAST  Result Date: 05/13/2020 CLINICAL DATA:  Abdominal pain with concern for aortic dissection EXAM: CT ANGIOGRAPHY CHEST, ABDOMEN AND PELVIS TECHNIQUE: Non-contrast CT of the chest was initially obtained. Multidetector CT imaging through the chest, abdomen and pelvis was performed using the standard protocol during bolus administration of intravenous contrast. Multiplanar reconstructed images and MIPs were obtained and reviewed to evaluate the vascular anatomy. CONTRAST:  OMNIPAQUE IOHEXOL 350 MG/ML SOLN COMPARISON:  06/23/2016. 12/14/2016 chest CT and 08/15/2016 abdominal CT FINDINGS: CTA CHEST FINDINGS Cardiovascular: No acute intramural hematoma on the noncontrast phase. Normal heart size. No pericardial effusion. Severe atheromatous disease of the aorta, great vessels, and coronaries. 36 mm diameter descending thoracic aorta just above the hiatus. This includes a ventral predominant bulge which shows progressive mural thrombus since prior. Additional notable rightward bulging just below the isthmus with total diameter of 34 mm at this level, non progressed from 2018. No pulmonary artery filling defect. Mediastinum/Nodes: No hematoma or air leak Lungs/Pleura: Airway plugging in the right lower lobe with mild atelectasis and airspace opacity. Centrilobular emphysema with hyperinflation. Chronic calcified nodule in the right upper lobe attributed to old granulomatous disease. Musculoskeletal: No acute finding Review of the MIP images confirms the above findings. CTA ABDOMEN AND PELVIS FINDINGS VASCULAR Aorta: Severe atherosclerosis. In 2018 there was aortic aneurysm repair with left iliac and right femoral grafting. Mural high-density at the upper margin of the graft is attributed to suture material. No evidence of leaking or adjacent aneurysmal dilatation. Diffuse attenuation of the iliac arteries from atherosclerosis. Bilateral  inflow is patent. 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 No acute occlusion or aneurysm. SMA: Less than 50% atheromatous narrowing at the proximal SMA. No branch occlusion, beading, or aneurysm. Renals: Accessory right lower pole renal artery. Heavily calcified left renal artery ostium with 50% stenosis by coronal reformats. No acute finding. IMA: Likely sacrificed. Inflow: As above Veins: Negative in the arterial phase Review of the MIP images confirms the above findings. NON-VASCULAR Hepatobiliary: Vague high-density area in the central liver which is likely perfusional.No evidence of biliary obstruction or stone. Pancreas: Unremarkable. Spleen: Unremarkable. Adrenals/Urinary Tract: Negative adrenals. No hydronephrosis or stone. Unremarkable bladder. Stomach/Bowel:  No obstruction. No appendicitis. Vascular/Lymphatic: No acute vascular abnormality. No mass or adenopathy. Reproductive:No pathologic findings. Other: No ascites or pneumoperitoneum. Musculoskeletal: No acute abnormalities. Advanced lumbar spine degeneration with levoscoliosis and reversal of lumbar lordosis. Review of the MIP images confirms the above findings. IMPRESSION: 1. No acute vascular finding. 2. Descending thoracic aortic aneurysms measuring up to 3.6 cm in diameter. 3. Treated abdominal aortic aneurysm. No interval or adverse finding. 4. Aspiration or mucous plugging in the right lower lobe mild atelectasis. 5. Aortic Atherosclerosis (ICD10-I70.0) and Emphysema (ICD10-J43.9), both advanced. 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 Electronically Signed   By: Marnee Spring M.D.   On: 05/13/2020 08:50    Cardiac Studies   Echo 05/13/2020> pending  Patient Profile  70 y.o. female with PMHx of CAD s/p stent, COPD, tobacco use disorder presenting with acute onset dyspnea and chest pain that has resolved admitted for NSTEMI.  Assessment & Plan    NSTEMI Patient presented  with acute onset dyspnea with substernal nonradiating chest pain. She is currently on 2L oxygen and is chest pain free. Did have elevated troponins [phone] but EKG is mostly unchanged from prior, only significant for an old anterior infarction that was present on EKG's from 2018. No ST segment elevated noted. Bedside Echo does appear to have decreased EF with decreased wall motion in the LAD distribution, concerning for possible Takotsubo-like pathology. She is started on IV heparin.  - Continue IV heparin - Aspirin 325mg  once followed by aspirin 81mg  daily - Coreg 3.125mg  twice daily - Lipitor 80mg  daily  - LHC/RHC in AM - Follow up Echo results  - Continue cardiac monitoring   Acute hypoxic respiratory failure COPD Patient with history of COPD not on oxygen at baseline presenting with acute shortness of breath. She was noted to be hypoxic to 84% on room air. This improved with solumedrol and bronchodilators. At baseline, she is on Breo Ellipta, albuterol nebulizer and inhaler prn. She notes medication compliance and has not missed her inhalers. CT Angio with possible aspiration/mucous plugging in RLL with mild atelectasis. Lungs clear to auscultation bilaterally on exam.  - Continue oxygen supplement to maintain SpO2 88-92% - Incruse Ellipta 1 puff daily - Albuterol inhaler prn  Thoracic aortic aneurysm Hx of abdominal aortic aneurysm s/p repair CT Angio with descending thoracic aortic aneurysm up to 3.6cm. Vascular surgery was consulted for this initially in the ED. Patient's symptoms not thought to be secondary to her aneurysm. She follows with Dr. . -Continue to monitor   Anorexia/Cachexia Malnutrition Patient severely cachectic. She notes this is chronic and usually does not have much of an appetite as this makes her breathing worse. She has a history of COPD with centrilobular emphysema which could be contributing to her cachexia. Has extensive smoking history (60 pack year)  and noted to have chronic calcified nodule in RUL attributed to old granulomatous disease. - Consider dietician consult   Signed, , MD Internal Medicine, PGY-2 05/13/20 12:59 PM Pager # [phone]

## 2020-05-13 NOTE — ED Notes (Signed)
External cath charted on this patient accidently

## 2020-05-13 NOTE — Progress Notes (Signed)
[facility] imaging downtime.   CTA CAP was reviewed on the scanner.    1. No acute vascular finding.   2. Aneurysmal aorta with graft needing correlation with prior CTA not available at this time..   3. Probable mild aspiration in to RLL.  Advanced emphysema.  Please follow up final.  JWatts MD

## 2020-05-14 ENCOUNTER — Encounter (HOSPITAL_COMMUNITY)
Admission: EM | Disposition: A | Discharge status: Home / Self Care | Payer: Self-pay | Source: Home / Self Care | Attending: Internal Medicine

## 2020-05-14 DIAGNOSIS — J9601 Acute respiratory failure with hypoxia: Secondary | ICD-10-CM

## 2020-05-14 DIAGNOSIS — I2510 Atherosclerotic heart disease of native coronary artery without angina pectoris: Secondary | ICD-10-CM

## 2020-05-14 DIAGNOSIS — I10 Essential (primary) hypertension: Secondary | ICD-10-CM

## 2020-05-14 DIAGNOSIS — I5021 Acute systolic (congestive) heart failure: Secondary | ICD-10-CM | POA: Diagnosis not present

## 2020-05-14 DIAGNOSIS — I5181 Takotsubo syndrome: Secondary | ICD-10-CM

## 2020-05-14 DIAGNOSIS — I714 Abdominal aortic aneurysm, without rupture: Secondary | ICD-10-CM

## 2020-05-14 DIAGNOSIS — D72829 Elevated white blood cell count, unspecified: Secondary | ICD-10-CM

## 2020-05-14 DIAGNOSIS — E785 Hyperlipidemia, unspecified: Secondary | ICD-10-CM

## 2020-05-14 DIAGNOSIS — Z720 Tobacco use: Secondary | ICD-10-CM

## 2020-05-14 DIAGNOSIS — I214 Non-ST elevation (NSTEMI) myocardial infarction: Secondary | ICD-10-CM | POA: Diagnosis not present

## 2020-05-14 HISTORY — DX: Acute respiratory failure with hypoxia: J96.01

## 2020-05-14 HISTORY — DX: Elevated white blood cell count, unspecified: D72.829

## 2020-05-14 HISTORY — DX: Essential (primary) hypertension: I10

## 2020-05-14 HISTORY — DX: Tobacco use: Z72.0

## 2020-05-14 HISTORY — PX: RIGHT/LEFT HEART CATH AND CORONARY ANGIOGRAPHY: CATH118266

## 2020-05-14 HISTORY — DX: Hyperlipidemia, unspecified: E78.5

## 2020-05-14 LAB — POCT I-STAT EG7
Acid-base deficit: 1 mmol/L (ref 0.0–2.0)
Bicarbonate: 23.7 mmol/L (ref 20.0–28.0)
Calcium, Ion: 1.09 mmol/L — ABNORMAL LOW (ref 1.15–1.40)
HCT: 36 % (ref 36.0–46.0)
Hemoglobin: 12.2 g/dL (ref 12.0–15.0)
O2 Saturation: 67 %
Potassium: 3.5 mmol/L (ref 3.5–5.1)
Sodium: 143 mmol/L (ref 135–145)
TCO2: 25 mmol/L (ref 22–32)
pCO2, Ven: 38.8 mmHg — ABNORMAL LOW (ref 44.0–60.0)
pH, Ven: 7.394 (ref 7.250–7.430)
pO2, Ven: 35 mmHg (ref 32.0–45.0)

## 2020-05-14 LAB — POCT I-STAT 7, (LYTES, BLD GAS, ICA,H+H)
Acid-base deficit: 1 mmol/L (ref 0.0–2.0)
Bicarbonate: 23.5 mmol/L (ref 20.0–28.0)
Calcium, Ion: 1.22 mmol/L (ref 1.15–1.40)
HCT: 37 % (ref 36.0–46.0)
Hemoglobin: 12.6 g/dL (ref 12.0–15.0)
O2 Saturation: 96 %
Potassium: 3.7 mmol/L (ref 3.5–5.1)
Sodium: 140 mmol/L (ref 135–145)
TCO2: 25 mmol/L (ref 22–32)
pCO2 arterial: 36.8 mmHg (ref 32.0–48.0)
pH, Arterial: 7.412 (ref 7.350–7.450)
pO2, Arterial: 82 mmHg — ABNORMAL LOW (ref 83.0–108.0)

## 2020-05-14 LAB — CBC
HCT: 35.2 % — ABNORMAL LOW (ref 36.0–46.0)
Hemoglobin: 11.8 g/dL — ABNORMAL LOW (ref 12.0–15.0)
MCH: 31.5 pg (ref 26.0–34.0)
MCHC: 33.5 g/dL (ref 30.0–36.0)
MCV: 93.9 fL (ref 80.0–100.0)
Platelets: 158 10*3/uL (ref 150–400)
RBC: 3.75 MIL/uL — ABNORMAL LOW (ref 3.87–5.11)
RDW: 13.2 % (ref 11.5–15.5)
WBC: 8.8 10*3/uL (ref 4.0–10.5)
nRBC: 0 % (ref 0.0–0.2)

## 2020-05-14 LAB — LIPID PANEL
Cholesterol: 183 mg/dL (ref 0–200)
HDL: 52 mg/dL (ref >40)
LDL Cholesterol: 115 mg/dL — ABNORMAL HIGH (ref 0–99)
Total CHOL/HDL Ratio: 3.5 RATIO
Triglycerides: 82 mg/dL (ref <150)
VLDL: 16 mg/dL (ref 0–40)

## 2020-05-14 LAB — HEMOGLOBIN A1C
Hgb A1c MFr Bld: 5.8 % — ABNORMAL HIGH (ref 4.8–5.6)
Mean Plasma Glucose: 119.76 mg/dL

## 2020-05-14 LAB — HEPARIN LEVEL (UNFRACTIONATED)
Heparin Unfractionated: 0.32 IU/mL (ref 0.30–0.70)
Heparin Unfractionated: 0.16 IU/mL — ABNORMAL LOW (ref 0.30–0.70)

## 2020-05-14 SURGERY — RIGHT/LEFT HEART CATH AND CORONARY ANGIOGRAPHY
Anesthesia: LOCAL

## 2020-05-14 MED ORDER — MIDAZOLAM HCL 2 MG/2ML IJ SOLN
INTRAMUSCULAR | Status: AC
  Filled 2020-05-14: qty 2

## 2020-05-14 MED ORDER — ALBUTEROL SULFATE (2.5 MG/3ML) 0.083% IN NEBU
2.5000 mg | INHALATION_SOLUTION | Freq: Four times a day (QID) | RESPIRATORY_TRACT | Status: DC
  Administered 2020-05-14 – 2020-05-16 (×6): 2.5 mg via RESPIRATORY_TRACT
  Filled 2020-05-14 (×6): qty 3

## 2020-05-14 MED ORDER — NICOTINE 7 MG/24HR TD PT24
7.0000 mg | MEDICATED_PATCH | Freq: Every day | TRANSDERMAL | Status: DC
  Administered 2020-05-15: 7 mg via TRANSDERMAL
  Filled 2020-05-14 (×3): qty 1

## 2020-05-14 MED ORDER — SODIUM CHLORIDE 0.9% FLUSH: 3.0000 mL | INTRAVENOUS | Status: DC | PRN

## 2020-05-14 MED ORDER — SODIUM CHLORIDE 0.9 % IV SOLN: INTRAVENOUS | Status: DC

## 2020-05-14 MED ORDER — LABETALOL HCL 5 MG/ML IV SOLN: 10.0000 mg | INTRAVENOUS | Status: AC | PRN

## 2020-05-14 MED ORDER — SODIUM CHLORIDE 0.9 % IV SOLN: 250.0000 mL | INTRAVENOUS | Status: DC | PRN

## 2020-05-14 MED ORDER — FLUTICASONE FUROATE-VILANTEROL 100-25 MCG/INH IN AEPB
1.0000 | INHALATION_SPRAY | Freq: Every day | RESPIRATORY_TRACT | Status: DC
  Filled 2020-05-14 (×3): qty 28

## 2020-05-14 MED ORDER — IOHEXOL 350 MG/ML SOLN
INTRAVENOUS | Status: DC | PRN
  Administered 2020-05-14: 55 mL

## 2020-05-14 MED ORDER — ONDANSETRON HCL 4 MG/2ML IJ SOLN: 4.0000 mg | Freq: Four times a day (QID) | INTRAMUSCULAR | Status: DC | PRN

## 2020-05-14 MED ORDER — LOSARTAN POTASSIUM 25 MG PO TABS
12.5000 mg | ORAL_TABLET | Freq: Every day | ORAL | Status: DC
  Administered 2020-05-14 – 2020-05-16 (×3): 12.5 mg via ORAL
  Filled 2020-05-14 (×3): qty 1

## 2020-05-14 MED ORDER — LIDOCAINE HCL (PF) 1 % IJ SOLN
INTRAMUSCULAR | Status: AC
  Filled 2020-05-14: qty 30

## 2020-05-14 MED ORDER — FENTANYL CITRATE (PF) 100 MCG/2ML IJ SOLN
INTRAMUSCULAR | Status: AC
  Filled 2020-05-14: qty 2

## 2020-05-14 MED ORDER — ACETAMINOPHEN 325 MG PO TABS
650.0000 mg | ORAL_TABLET | Freq: Four times a day (QID) | ORAL | Status: DC | PRN
  Administered 2020-05-15: 650 mg via ORAL
  Filled 2020-05-14: qty 2

## 2020-05-14 MED ORDER — HEPARIN (PORCINE) IN NACL 1000-0.9 UT/500ML-% IV SOLN
INTRAVENOUS | Status: DC | PRN
  Administered 2020-05-14 (×2): 500 mL

## 2020-05-14 MED ORDER — SODIUM CHLORIDE 0.9 % WEIGHT BASED INFUSION
1.0000 mL/kg/h | INTRAVENOUS | Status: AC
  Administered 2020-05-14: 1 mL/kg/h via INTRAVENOUS

## 2020-05-14 MED ORDER — HEPARIN SODIUM (PORCINE) 1000 UNIT/ML IJ SOLN
INTRAMUSCULAR | Status: DC | PRN
  Administered 2020-05-14: 2000 [IU] via INTRAVENOUS

## 2020-05-14 MED ORDER — ACETAMINOPHEN 650 MG RE SUPP: 650.0000 mg | Freq: Four times a day (QID) | RECTAL | Status: DC | PRN

## 2020-05-14 MED ORDER — FENTANYL CITRATE (PF) 100 MCG/2ML IJ SOLN
INTRAMUSCULAR | Status: DC | PRN
  Administered 2020-05-14 (×2): 25 ug via INTRAVENOUS

## 2020-05-14 MED ORDER — ONDANSETRON HCL 4 MG PO TABS: 4.0000 mg | ORAL_TABLET | Freq: Four times a day (QID) | ORAL | Status: DC | PRN

## 2020-05-14 MED ORDER — NITROGLYCERIN 0.4 MG SL SUBL: 0.4000 mg | SUBLINGUAL_TABLET | SUBLINGUAL | Status: DC | PRN

## 2020-05-14 MED ORDER — HYDRALAZINE HCL 20 MG/ML IJ SOLN: 10.0000 mg | INTRAMUSCULAR | Status: AC | PRN

## 2020-05-14 MED ORDER — LIDOCAINE HCL (PF) 1 % IJ SOLN
INTRAMUSCULAR | Status: DC | PRN
  Administered 2020-05-14 (×2): 2 mL

## 2020-05-14 MED ORDER — ACETAMINOPHEN 325 MG PO TABS: 650.0000 mg | ORAL_TABLET | ORAL | Status: DC | PRN

## 2020-05-14 MED ORDER — GUAIFENESIN ER 600 MG PO TB12
600.0000 mg | ORAL_TABLET | Freq: Two times a day (BID) | ORAL | Status: DC
  Administered 2020-05-14 – 2020-05-16 (×4): 600 mg via ORAL
  Filled 2020-05-14 (×4): qty 1

## 2020-05-14 MED ORDER — SODIUM CHLORIDE 0.9% FLUSH: 3.0000 mL | Freq: Two times a day (BID) | INTRAVENOUS | Status: DC

## 2020-05-14 MED ORDER — HEPARIN SODIUM (PORCINE) 1000 UNIT/ML IJ SOLN
INTRAMUSCULAR | Status: AC
  Filled 2020-05-14: qty 1

## 2020-05-14 MED ORDER — PREDNISONE 20 MG PO TABS
40.0000 mg | ORAL_TABLET | Freq: Every day | ORAL | Status: DC
  Administered 2020-05-14: 40 mg via ORAL
  Filled 2020-05-14 (×2): qty 2

## 2020-05-14 MED ORDER — AZITHROMYCIN 250 MG PO TABS
500.0000 mg | ORAL_TABLET | Freq: Every day | ORAL | Status: DC
  Administered 2020-05-14 – 2020-05-16 (×3): 500 mg via ORAL
  Filled 2020-05-14 (×3): qty 2

## 2020-05-14 MED ORDER — VERAPAMIL HCL 2.5 MG/ML IV SOLN
INTRAVENOUS | Status: DC | PRN
  Administered 2020-05-14: 10 mL via INTRA_ARTERIAL

## 2020-05-14 MED ORDER — ASPIRIN EC 81 MG PO TBEC: 81.0000 mg | DELAYED_RELEASE_TABLET | Freq: Every day | ORAL | Status: DC

## 2020-05-14 MED ORDER — VERAPAMIL HCL 2.5 MG/ML IV SOLN
INTRAVENOUS | Status: AC
  Filled 2020-05-14: qty 2

## 2020-05-14 MED ORDER — ALBUTEROL SULFATE (2.5 MG/3ML) 0.083% IN NEBU: 2.5000 mg | INHALATION_SOLUTION | Freq: Four times a day (QID) | RESPIRATORY_TRACT | Status: DC | PRN

## 2020-05-14 MED ORDER — SODIUM CHLORIDE 0.9% FLUSH
3.0000 mL | Freq: Two times a day (BID) | INTRAVENOUS | Status: DC
  Administered 2020-05-15 (×2): 3 mL via INTRAVENOUS

## 2020-05-14 MED ORDER — UMECLIDINIUM BROMIDE 62.5 MCG/INH IN AEPB
1.0000 | INHALATION_SPRAY | Freq: Every day | RESPIRATORY_TRACT | Status: DC
  Filled 2020-05-14 (×3): qty 7

## 2020-05-14 MED ORDER — MIDAZOLAM HCL 2 MG/2ML IJ SOLN
INTRAMUSCULAR | Status: DC | PRN
  Administered 2020-05-14: 1 mg via INTRAVENOUS

## 2020-05-14 MED ORDER — HEPARIN (PORCINE) IN NACL 1000-0.9 UT/500ML-% IV SOLN
INTRAVENOUS | Status: AC
  Filled 2020-05-14: qty 1000

## 2020-05-14 SURGICAL SUPPLY — 12 items
CATH BALLN WEDGE 5F 110CM (CATHETERS) ×2 IMPLANT
CATH INFINITI MULTIPACK ANG 4F (CATHETERS) ×2 IMPLANT
DEVICE RAD TR BAND REGULAR (VASCULAR PRODUCTS) ×2 IMPLANT
GLIDESHEATH SLEND SS 6F .021 (SHEATH) IMPLANT
GUIDEWIRE .025 260CM (WIRE) ×2 IMPLANT
GUIDEWIRE INQWIRE 1.5J.035X260 (WIRE) ×1 IMPLANT
INQWIRE 1.5J .035X260CM (WIRE) ×2
KIT HEART LEFT (KITS) ×2 IMPLANT
PACK CARDIAC CATHETERIZATION (CUSTOM PROCEDURE TRAY) ×2 IMPLANT
SHEATH GLIDE SLENDER 4/5FR (SHEATH) ×4 IMPLANT
TRANSDUCER W/STOPCOCK (MISCELLANEOUS) ×2 IMPLANT
TUBING CIL FLEX 10 FLL-RA (TUBING) ×2 IMPLANT

## 2020-05-14 NOTE — Progress Notes (Signed)
ANTICOAGULATION CONSULT NOTE   Pharmacy Consult for Heparin Indication: chest pain/ACS  Allergies  Allergen Reactions  . No Known Allergies    Patient Measurements: Heparin Dosing Weight: 34 kg (estimated ~75 lbs)  Vital Signs: BP: 98/58 (03/04 0300) Pulse Rate: 76 (03/04 0300)  Labs: Recent Labs    05/13/20 0158 05/13/20 0358 05/13/20 1103 05/13/20 1328 05/13/20 1800 05/14/20 0240  HGB 12.7  --   --   --   --  11.8*  HCT 40.2  --   --   --   --  35.2*  PLT 152  --   --   --   --  158  HEPARINUNFRC  --   --   --   --  0.14* 0.32  CREATININE 0.87  --   --   --   --   --   TROPONINIHS 75* 422* 675* 956*  --   --     CrCl cannot be calculated (Unknown ideal weight.).   Assessment: Pt is a 70 y/o female presenting with chest and back pain undergoing evaluation for ACS. Pt has a hx of AAA without current signs of dissection or ulceration. Pharmacy has been consulted for heparin while undergoing cardiac workup.  Heparin level therapeutic (0.32) on gtt at 600 units/hr. No bleeding noted.  Goal of Therapy:  Heparin level 0.3-0.7 units/ml Monitor platelets by anticoagulation protocol: Yes   Plan:  Continue heparin infusion at 600 units/hr 6 hr heparin level to confirm  Christoper Fabian, PharmD, BCPS Please see amion for complete clinical pharmacist phone list 05/14/2020 3:41 AM

## 2020-05-14 NOTE — Progress Notes (Signed)
Cardiology Progress Note  Patient ID: Alexandra Mays MRN: [id number] DOB: 04-Oct-1950 Date of Encounter: 05/14/2020  Primary Cardiologist: No primary care provider on file.  Subjective   Chief Complaint: Shortness of breath with exertion  HPI: Still no chest pain.  Plans for left heart cath today.  Lung sounds with rhonchi on exam.  ROS:  All other ROS reviewed and negative. Pertinent positives noted in the HPI.     Inpatient Medications  Scheduled Meds: . aspirin EC  81 mg Oral Daily  . atorvastatin  80 mg Oral Daily  . carvedilol  3.125 mg Oral BID WC  . umeclidinium bromide  1 puff Inhalation Daily   Continuous Infusions: . heparin 600 Units/hr (05/13/20 1954)   PRN Meds:    Vital Signs   Vitals:   05/14/20 0400 05/14/20 0500 05/14/20 0600 05/14/20 0700  BP: (!) 106/59 114/63  115/78  Pulse: 73 72 75 91  Resp: 20 19 (!) 21 18  Temp:      TempSrc:      SpO2: 95% 97% 95% 93%   No intake or output data in the 24 hours ending 05/14/20 0837 Last 3 Weights 10/31/2019 12/14/2016 08/16/2016  Weight (lbs) 80 lb 85 lb 14.4 oz 85 lb  Weight (kg) 36.288 kg 38.964 kg 38.556 kg      Telemetry  Overnight telemetry shows sinus rhythm in the 80s, which I personally reviewed.   ECG  The most recent ECG shows normal sinus rhythm heart rate 86, PVC noted, anterior infarct, which I personally reviewed.   Physical Exam   Vitals:   05/14/20 0400 05/14/20 0500 05/14/20 0600 05/14/20 0700  BP: (!) 106/59 114/63  115/78  Pulse: 73 72 75 91  Resp: 20 19 (!) 21 18  Temp:      TempSrc:      SpO2: 95% 97% 95% 93%   No intake or output data in the 24 hours ending 05/14/20 0837  Last 3 Weights 10/31/2019 12/14/2016 08/16/2016  Weight (lbs) 80 lb 85 lb 14.4 oz 85 lb  Weight (kg) 36.288 kg 38.964 kg 38.556 kg    There is no height or weight on file to calculate BMI.   General: Thin, cachexia noted Head: Atraumatic, normal size  Eyes: PEERLA, EOMI  Neck: Supple, no JVD Endocrine: No  thryomegaly Cardiac: Normal S1, S2; RRR; no murmurs, rubs, or gallops Lungs: Diffuse rhonchi bilaterally Abd: Soft, nontender, no hepatomegaly  Ext: No edema, pulses 2+ Musculoskeletal: No deformities, BUE and BLE strength normal and equal Skin: Warm and dry, no rashes   Neuro: Alert and oriented to person, place, time, and situation, CNII-XII grossly intact, no focal deficits  Psych: Normal mood and affect   Labs  High Sensitivity Troponin:   Recent Labs  Lab 05/13/20 0158 05/13/20 0358 05/13/20 1103 05/13/20 1328  TROPONINIHS 75* 422* 675* 956*     Cardiac EnzymesNo results for input(s): TROPONINI in the last 168 hours. No results for input(s): TROPIPOC in the last 168 hours.  Chemistry Recent Labs  Lab 05/13/20 0158  NA 140  K 4.6  CL 106  CO2 21*  GLUCOSE 122*  BUN 15  CREATININE 0.87  CALCIUM 9.0  GFRNONAA >60  ANIONGAP 13    Hematology Recent Labs  Lab 05/13/20 0158 05/14/20 0240  WBC 11.1* 8.8  RBC 4.08 3.75*  HGB 12.7 11.8*  HCT 40.2 35.2*  MCV 98.5 93.9  MCH 31.1 31.5  MCHC 31.6 33.5  RDW 13.1 13.2  PLT 152 158   BNP Recent Labs  Lab 05/13/20 0158  BNP 81.4    DDimer No results for input(s): DDIMER in the last 168 hours.   Radiology  DG Chest Port 1 View  Result Date: 05/13/2020 CLINICAL DATA:  Dyspnea EXAM: PORTABLE CHEST 1 VIEW COMPARISON:  07/25/2016, CT 12/14/2016 FINDINGS: The lungs are symmetrically hyperinflated in keeping with changes of underlying COPD. Right apical nodule is grossly unchanged from prior CT examination,. No new focal pulmonary nodules or infiltrates. Nipple shadows overlie the lung bases bilaterally. No pneumothorax or pleural effusion. Cardiac size within normal limits. Pulmonary vascularity is normal. No acute bone abnormality. IMPRESSION: No active disease. COPD. Grossly stable right apical pulmonary nodule. Electronically Signed   By: Helyn Numbers MD   On: 05/13/2020 02:21   ECHOCARDIOGRAM COMPLETE  Result Date:  05/13/2020    ECHOCARDIOGRAM REPORT   Patient Name:   Alexandra Mays Date of Exam: 05/13/2020 Medical Rec #:  [id number]         Height:       63.0 in Accession #:    [id number]        Weight:       80.0 lb Date of Birth:  Jul 02, 1950          BSA:          1.310 m Patient Age:    70 years          BP:           109/66 mmHg Patient Gender: F                 HR:           87 bpm. Exam Location:  Inpatient Procedure: 2D Echo, Cardiac Doppler, Color Doppler and Intracardiac            Opacification Agent Indications:    R07.9* Chest pain, unspecified  History:        Patient has no prior history of Echocardiogram examinations.                 Signs/Symptoms:Chest Pain, Shortness of Breath and Dyspnea.                 Aortic aneurysm repair.  Sonographer:    Sheralyn Boatman RDCS Referring Phys: [id number] [facility] NICOLE DUKE  Sonographer Comments: Technically difficult study due to poor echo windows. Extremely thin habitus, difficult angles. Interrupted multiple times. IMPRESSIONS  1. Left ventricular ejection fraction, by estimation, is 20 to 25%. The left ventricle has severely decreased function.  2. There is severe hypokinesis of all mid-to-apical LV segments. The basal segments have hyperdynamic contractility. 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  3. Left ventricular diastolic parameters are consistent with Grade I diastolic dysfunction (impaired relaxation).  4. Right ventricular systolic function is normal. The right ventricular size is normal.  5. The mitral valve is degenerative. Trivial mitral valve regurgitation.  6. The aortic valve was not well visualized. There is mild calcification of the aortic valve. There is mild thickening of the aortic valve. Aortic valve regurgitation is trivial.  7. The inferior vena cava is dilated in size with <50% respiratory variability, suggesting right atrial pressure of 15 mmHg.  8. Slow flow noted in the IVC. FINDINGS  Left Ventricle: Left  ventricular ejection fraction, by estimation, is 20 to 25%. The left ventricle has severely decreased function. The left ventricle demonstrates regional wall motion abnormalities. Definity contrast agent was  given IV to delineate the left ventricular endocardial borders. There is severe hypokinesis of all mid-to-apical LV segments. The basal segments have hyperdynamic contractility. 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 The left ventricular internal cavity size was normal in size. There is no left ventricular hypertrophy. Left ventricular diastolic parameters are consistent with Grade I diastolic dysfunction (impaired relaxation). Right Ventricle: The right ventricular size is normal. Right vetricular wall thickness was not well visualized. Right ventricular systolic function is normal. Left Atrium: Left atrial size was normal in size. Right Atrium: Right atrial size was normal in size. Pericardium: There is no evidence of pericardial effusion. Mitral Valve: The mitral valve is degenerative in appearance. Trivial mitral valve regurgitation. Tricuspid Valve: The tricuspid valve is normal in structure. Tricuspid valve regurgitation is mild. Aortic Valve: The aortic valve was not well visualized. There is mild calcification of the aortic valve. There is mild thickening of the aortic valve. Aortic valve regurgitation is trivial. Pulmonic Valve: The pulmonic valve was normal in structure. Pulmonic valve regurgitation is trivial. Aorta: Aortic root could not be assessed and the aortic root is normal in size and structure. Venous: The inferior vena cava is dilated in size with less than 50% respiratory variability, suggesting right atrial pressure of 15 mmHg. IAS/Shunts: No atrial level shunt detected by color flow Doppler.  LEFT VENTRICLE PLAX 2D LVIDd:         3.80 cm     Diastology LVIDs:         3.60 cm     LV e' medial:    4.46 cm/s LV PW:         1.00 cm     LV E/e' medial:  13.4 LV  IVS:        1.00 cm     LV e' lateral:   4.57 cm/s LVOT diam:     1.80 cm     LV E/e' lateral: 13.1 LV SV:         25 LV SV Index:   19 LVOT Area:     2.54 cm  LV Volumes (MOD) LV vol d, MOD A4C: 65.2 ml LV vol s, MOD A4C: 57.1 ml LV SV MOD A4C:     65.2 ml RIGHT VENTRICLE             IVC RV S prime:     11.10 cm/s  IVC diam: 1.70 cm TAPSE (M-mode): 1.6 cm LEFT ATRIUM             Index      RIGHT ATRIUM          Index LA diam:        2.10 cm 1.60 cm/m RA Area:     6.93 cm LA Vol (A2C):   10.8 ml 8.24 ml/m RA Volume:   12.20 ml 9.31 ml/m LA Vol (A4C):   11.0 ml 8.40 ml/m LA Biplane Vol: 11.7 ml 8.93 ml/m  AORTIC VALVE LVOT Vmax:   65.00 cm/s LVOT Vmean:  40.800 cm/s LVOT VTI:    0.100 m  AORTA Ao Root diam: 2.80 cm MITRAL VALVE MV Area (PHT): 5.38 cm    SHUNTS MV Decel Time: 141 msec    Systemic VTI:  0.10 m MV E velocity: 59.70 cm/s  Systemic Diam: 1.80 cm MV A velocity: 77.10 cm/s MV E/A ratio:  0.77 Laurance Flatten MD Electronically signed by Laurance Flatten MD Signature Date/Time: 05/13/2020/2:48:10 PM    Final    CT ANGIO CHEST/ABD/PEL  FOR DISSECTION W &/OR WO CONTRAST  Result Date: 05/13/2020 CLINICAL DATA:  Abdominal pain with concern for aortic dissection EXAM: CT ANGIOGRAPHY CHEST, ABDOMEN AND PELVIS TECHNIQUE: Non-contrast CT of the chest was initially obtained. Multidetector CT imaging through the chest, abdomen and pelvis was performed using the standard protocol during bolus administration of intravenous contrast. Multiplanar reconstructed images and MIPs were obtained and reviewed to evaluate the vascular anatomy. CONTRAST:  OMNIPAQUE IOHEXOL 350 MG/ML SOLN COMPARISON:  06/23/2016. 12/14/2016 chest CT and 08/15/2016 abdominal CT FINDINGS: CTA CHEST FINDINGS Cardiovascular: No acute intramural hematoma on the noncontrast phase. Normal heart size. No pericardial effusion. Severe atheromatous disease of the aorta, great vessels, and coronaries. 36 mm diameter descending thoracic aorta  just above the hiatus. This includes a ventral predominant bulge which shows progressive mural thrombus since prior. Additional notable rightward bulging just below the isthmus with total diameter of 34 mm at this level, non progressed from 2018. No pulmonary artery filling defect. Mediastinum/Nodes: No hematoma or air leak Lungs/Pleura: Airway plugging in the right lower lobe with mild atelectasis and airspace opacity. Centrilobular emphysema with hyperinflation. Chronic calcified nodule in the right upper lobe attributed to old granulomatous disease. Musculoskeletal: No acute finding Review of the MIP images confirms the above findings. CTA ABDOMEN AND PELVIS FINDINGS VASCULAR Aorta: Severe atherosclerosis. In 2018 there was aortic aneurysm repair with left iliac and right femoral grafting. Mural high-density at the upper margin of the graft is attributed to suture material. No evidence of leaking or adjacent aneurysmal dilatation. Diffuse attenuation of the iliac arteries from atherosclerosis. Bilateral inflow is patent. 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 No acute occlusion or aneurysm. SMA: Less than 50% atheromatous narrowing at the proximal SMA. No branch occlusion, beading, or aneurysm. Renals: Accessory right lower pole renal artery. Heavily calcified left renal artery ostium with 50% stenosis by coronal reformats. No acute finding. IMA: Likely sacrificed. Inflow: As above Veins: Negative in the arterial phase Review of the MIP images confirms the above findings. NON-VASCULAR Hepatobiliary: Vague high-density area in the central liver which is likely perfusional.No evidence of biliary obstruction or stone. Pancreas: Unremarkable. Spleen: Unremarkable. Adrenals/Urinary Tract: Negative adrenals. No hydronephrosis or stone. Unremarkable bladder. Stomach/Bowel:  No obstruction. No appendicitis. Vascular/Lymphatic: No acute vascular abnormality. No mass or adenopathy. Reproductive:No  pathologic findings. Other: No ascites or pneumoperitoneum. Musculoskeletal: No acute abnormalities. Advanced lumbar spine degeneration with levoscoliosis and reversal of lumbar lordosis. Review of the MIP images confirms the above findings. IMPRESSION: 1. No acute vascular finding. 2. Descending thoracic aortic aneurysms measuring up to 3.6 cm in diameter. 3. Treated abdominal aortic aneurysm. No interval or adverse finding. 4. Aspiration or mucous plugging in the right lower lobe mild atelectasis. 5. Aortic Atherosclerosis (ICD10-I70.0) and Emphysema (ICD10-J43.9), both advanced. 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 Electronically Signed   By: Marnee Spring M.D.   On: 05/13/2020 08:50    Cardiac Studies  TTE 05/13/2020 1. Left ventricular ejection fraction, by estimation, is 20 to 25%. The  left ventricle has severely decreased function.  2. There is severe hypokinesis of all mid-to-apical LV segments. The  basal segments have hyperdynamic contractility. Findings concerning for  Takotsubo cardiomyopathy versus a wrap-around LAD lesion.  3. Left ventricular diastolic parameters are consistent with Grade I  diastolic dysfunction (impaired relaxation).  4. Right ventricular systolic function is normal. The right ventricular  size is normal.  5. The mitral valve is degenerative. Trivial  mitral valve regurgitation.  6. The aortic valve was not well visualized. There is mild calcification  of the aortic valve. There is mild thickening of the aortic valve. Aortic  valve regurgitation is trivial.  7. The inferior vena cava is dilated in size with <50% respiratory  variability, suggesting right atrial pressure of 15 mmHg.  8. Slow flow noted in the IVC.   Patient Profile  Alexandra Mays is a 70 y.o. female with remote history of PCI in 2007, COPD, tobacco abuse, descending aortic aneurysm, AAA status post repair who was admitted on 05/13/2020 with  acute shortness of breath and chest pain.  Found to have non-STEMI.  Also with significant cachexia and COPD.  Assessment & Plan   1. NSTEMI/Systolic HF, EF 20-25% w/RWMA -Admitted with acute onset chest pain.  EKG is unchanged which shows old anterior infarct.  She had a remote history of PCI 2007.  She had a stress test in 2018 that was reported as normal.  I do not have the results of that. -She has significant COPD, weight loss and cachexia. -She has mucous plugging in her right middle lobe. -Her echocardiogram is consistent with either a large LAD infarction versus a stress-induced cardiomyopathy.  Overall given her minimal troponin elevation for her level of LV dysfunction I favor a stress-induced cardiomyopathy.  We will nonetheless pursue a left heart catheterization today. -Continue aspirin heparin drip. -Beta-blocker has been added -BNP negative.  She appears euvolemic.  I think her IVC is dilated due to her COPD. -We will add ACE/ARB tomorrow. -Continue high intensity statin  2.  Descending aortic aneurysm 3.6 cm/prior AAA repair/celiac artery stenosis -Appears to be stable.  No concern for acute aortic pathology -No symptoms of mesenteric ischemia -Continue aspirin and statin. -She will need vascular follow-up.  3.  Diffuse rhonchi/COPD/mucous plugging -Management per hospital medicine.  Disposition: The cardiology service provided a consultation yesterday.  On 05/13/2020 ~12:35 pm we communicated with the emergency room that this patient should be admitted to hospital medicine we will consult.   It appears overnight there was an issue with who she admit the patient.  The patient has significant COPD and weight loss and anorexia.  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  This patient is not appropriate for cardiology admission as primary service.  This was discussed this morning with hospital medicine.  They have agreed to admit the patient.  We do appreciate their  assistance.  Shared Decision Making/Informed Consent The risks [stroke (1 in 1000), death (1 in 1000), kidney failure [usually temporary] (1 in 500), bleeding (1 in 200), allergic reaction [possibly serious] (1 in 200)], benefits (diagnostic support and management of coronary artery disease) and alternatives of a cardiac catheterization were discussed in detail with Alexandra Mays and she is willing to proceed.  For questions or updates, please contact [facility] Please consult [email or web address] for contact info under   Time Spent with Patient: I have spent a total of 25 minutes with patient reviewing hospital notes, telemetry, EKGs, labs and examining the patient as well as establishing an assessment and plan that was discussed with the patient.  > 50% of time was spent in direct patient care.    Signed, Lenna GilfordWesley T. Flora Lipps'Neal, MD [facility]  [facility]  05/14/2020 8:37 AM

## 2020-05-14 NOTE — H&P (Signed)
History and Physical    Alexandra Mays JXB:[id number] DOB: 1950-12-03 DOA: 05/13/2020  Referring MD/NP/PA: Lennie Odor, MD PCP: Tanna Furry, PA-C  Patient coming from: Home via EMS  Chief Complaint: Shortness of breath  I have personally briefly reviewed patient's old medical records in [facility]   HPI: Alexandra Mays is a 70 y.o. female with medical history significant of COPD, CAD s/p stent in 2007, and tobacco abuse who presented with complaints of shortness of breath yesterday evening.  She was sitting in her recliner watching television at the time when symptoms started acutely.  She was unable to catch her breath and complained of having a burning sensation in her chest that was severe.  Normally she is not on oxygen at baseline.  Over the last week patient noted that she was unable to smoke her usual 5- 6 cigarettes/day due to being more short of breath. Associated symptoms included intermittent cough and wheezing.  Denies having any recent fevers, nausea, vomiting, diaphoresis, leg swelling, calf pain, or change in weight.  Blood  EMS found patient have O2 saturations as low as 84% on room air and was placed on 2 L nasal cannula oxygen with improvement of O2 sats to 92%.  She she received duo nebs, Solu-Medrol, and albuterol treatment in route.  ED Course: Upon admission into the emergency department patient was seen to be afebrile, respirations 13-24, blood pressure 95/67 -116/69 and O2 saturations 93-95% currently on 2 L nasal cannula oxygen.  Labs from 3/3 significant for WBC 11.1, BNP 81.4, troponin 75->422->675, and TSH 1.11.  Labs from 3/4 significant for LDL 115, troponin 956, repeat hemoglobin 11.8.  CTA of the chest abdomen, and pelvis was significant for descending thoracic aortic aneurysm up to 3.6 cm along with aspiration versus mucous plugs in the right lower lobe with mild atelectasis. Patient was started on heparin drip and cardiology had been consulted to see  the patient.  There have been some miscommunication and tried had not been initially formally called to admit the patient.  Cardiology ended up placing temporary admission orders, but asked if we would take over care of the patient this morning.  Review of Systems  Respiratory: Positive for shortness of breath.   Cardiovascular: Positive for chest pain. Negative for leg swelling.  Psychiatric/Behavioral: Positive for substance abuse.    Past Medical History:  Diagnosis Date  . AAA (abdominal aortic aneurysm) (HCC)    AAA  . COPD (chronic obstructive pulmonary disease) (HCC)   . Dyspnea    with exertion  . Pure hyperglyceridemia     Past Surgical History:  Procedure Laterality Date  . ABDOMINAL AORTIC ANEURYSM REPAIR N/A 07/24/2016   Procedure: ABDOMINAL AORTIC ANEURYSM  REPAIR;  Surgeon: Sherren Kerns, MD;  Location: [facility] OR;  Service: Vascular;  Laterality: N/A;  . cardiac stenting  2007  . ENDARTERECTOMY Left 07/24/2016   Procedure: AORTA TO LEFT ILIAC ENDARTERECTOMY;  Surgeon: Sherren Kerns, MD;  Location: [facility] OR;  Service: Vascular;  Laterality: Left;  . ENDARTERECTOMY FEMORAL Right 07/24/2016   Procedure: AORTA TO RIGHT FEMORAL ENDARTERECTOMY;  Surgeon: Sherren Kerns, MD;  Location: [facility] OR;  Service: Vascular;  Laterality: Right;  . TOE SURGERY Left    3 rd toe Hammer toe     reports that she has been smoking cigarettes. She has a 30.00 pack-year smoking history. She has never used smokeless tobacco. She reports that she does not drink alcohol and does not use drugs.  Allergies  Allergen Reactions  . No Known Allergies     Family History  Problem Relation Age of Onset  . Stroke Mother     Prior to Admission medications   Medication Sig Start Date End Date Taking? Authorizing Provider  albuterol (PROVENTIL HFA;VENTOLIN HFA) 108 (90 Base) MCG/ACT inhaler Inhale 2 puffs into the lungs every 6 (six) hours as needed for wheezing or shortness of breath.   Yes  [provider]  albuterol (PROVENTIL) (2.5 MG/3ML) 0.083% nebulizer solution Take 3 mLs (2.5 mg total) by nebulization every 6 (six) hours as needed for wheezing or shortness of breath. 11/21/19  Yes Hunsucker, Lesia Sago, MD  BREO ELLIPTA 100-25 MCG/INH AEPB Inhale 1 puff into the lungs daily. 04/16/20  Yes [provider]  ibuprofen (ADVIL,MOTRIN) 200 MG tablet Take 800 mg by mouth every 6 (six) hours as needed for mild pain.   Yes [provider]  polyethylene glycol (MIRALAX / GLYCOLAX) packet Take 17 g by mouth daily. Patient taking differently: Take 17 g by mouth daily as needed for mild constipation. 06/23/16  Yes Forest Becker, MD  simvastatin (ZOCOR) 20 MG tablet Take 20 mg by mouth every morning.    Yes [provider]  Tiotropium Bromide Monohydrate 2.5 MCG/ACT AERS Inhale 2 puffs into the lungs daily.   Yes [provider]    Physical Exam:  Constitutional: Thin elderly female currently in no acute distress Vitals:   05/14/20 0600 05/14/20 0700 05/14/20 0800 05/14/20 0918  BP:  115/78 112/79   Pulse: 75 91 79   Resp: (!) 21 18 17    Temp:   98.2 F (36.8 C)   TempSrc:   Oral   SpO2: 95% 93% 95%   Weight:    37.2 kg  Height:    5\' 4"  (1.626 m)   Eyes: PERRL, lids and conjunctivae normal ENMT: Mucous membranes are moist. Posterior pharynx clear of any exudate or lesions.  Neck: normal, supple, no masses, no thyromegaly Respiratory: Mild expiratory wheezes appreciated in both lung fields.  Patient currently on room air with O2 saturations maintained and able to talk complete sentences. Cardiovascular: Regular rate and rhythm, no murmurs / rubs / gallops. No extremity edema. 2+ pedal pulses. No carotid bruits.  Abdomen: no tenderness, no masses palpated. No hepatosplenomegaly. Bowel sounds positive.  Musculoskeletal: no clubbing / cyanosis. No joint deformity upper and lower extremities. Good ROM, no contractures. Normal muscle tone.   Skin: Dry abrasion of left wrist. Neurologic: CN 2-12 grossly intact. Sensation intact, DTR normal. Strength 5/5 in all 4.  Psychiatric: Normal judgment and insight. Alert and oriented x 3. Normal mood.     Labs on Admission: I have personally reviewed following labs and imaging studies  CBC: Recent Labs  Lab 05/13/20 0158 05/14/20 0240  WBC 11.1* 8.8  NEUTROABS 6.9  --   HGB 12.7 11.8*  HCT 40.2 35.2*  MCV 98.5 93.9  PLT 152 158   Basic Metabolic Panel: Recent Labs  Lab 05/13/20 0158  NA 140  K 4.6  CL 106  CO2 21*  GLUCOSE 122*  BUN 15  CREATININE 0.87  CALCIUM 9.0   GFR: Estimated Creatinine Clearance: 34.8 mL/min (by C-G formula based on SCr of 0.87 mg/dL). Liver Function Tests: No results for input(s): AST, ALT, ALKPHOS, BILITOT, PROT, ALBUMIN in the last 168 hours. No results for input(s): LIPASE, AMYLASE in the last 168 hours. No results for input(s): AMMONIA in the last 168 hours. Coagulation Profile:  No results for input(s): INR, PROTIME in the last 168 hours. Cardiac Enzymes: No results for input(s): CKTOTAL, CKMB, CKMBINDEX, TROPONINI in the last 168 hours. BNP (last 3 results) No results for input(s): PROBNP in the last 8760 hours. HbA1C: Recent Labs    05/14/20 0240  HGBA1C 5.8*   CBG: No results for input(s): GLUCAP in the last 168 hours. Lipid Profile: Recent Labs    05/14/20 0240  CHOL 183  HDL 52  LDLCALC 115*  TRIG 82  CHOLHDL 3.5   Thyroid Function Tests: Recent Labs    05/13/20 1328  TSH 1.110   Anemia Panel: No results for input(s): VITAMINB12, FOLATE, FERRITIN, TIBC, IRON, RETICCTPCT in the last 72 hours. Urine analysis:    Component Value Date/Time   COLORURINE STRAW (A) 08/15/2016 1507   APPEARANCEUR CLEAR 08/15/2016 1507   LABSPEC 1.005 08/15/2016 1507   PHURINE 6.0 08/15/2016 1507   GLUCOSEU NEGATIVE 08/15/2016 1507   HGBUR NEGATIVE 08/15/2016 1507   BILIRUBINUR NEGATIVE 08/15/2016 1507   KETONESUR 5 (A)  08/15/2016 1507   PROTEINUR NEGATIVE 08/15/2016 1507   NITRITE NEGATIVE 08/15/2016 1507   LEUKOCYTESUR TRACE (A) 08/15/2016 1507   Sepsis Labs: Recent Results (from the past 240 hour(s))  Resp Panel by RT-PCR (Flu A&B, Covid) Nasopharyngeal Swab     Status: None   Collection Time: 05/13/20  2:07 AM   Specimen: Nasopharyngeal Swab; Nasopharyngeal(NP) swabs in vial transport medium  Result Value Ref Range Status   SARS Coronavirus 2 by RT PCR NEGATIVE NEGATIVE Final    Comment: (NOTE) SARS-CoV-2 target nucleic acids are NOT DETECTED.  The SARS-CoV-2 RNA is generally detectable in upper respiratory specimens during the acute phase of infection. The lowest concentration of SARS-CoV-2 viral copies this assay can detect is 138 copies/mL. A negative result does not preclude SARS-Cov-2 infection and should not be used as the sole basis for treatment or other patient management decisions. A negative result may occur with  improper specimen collection/handling, submission of specimen other than nasopharyngeal swab, presence of viral mutation(s) within the areas targeted by this assay, and inadequate number of viral copies(<138 copies/mL). A negative result must be combined with clinical observations, patient history, and epidemiological information. The expected result is Negative.  Fact Sheet for Patients:  BloggerCourse.com  Fact Sheet for Healthcare Providers:  SeriousBroker.it  This test is no t yet approved or cleared by the [place] FDA and  has been authorized for detection and/or diagnosis of SARS-CoV-2 by FDA under an Emergency Use Authorization (EUA). This EUA will remain  in effect (meaning this test can be used) for the duration of the COVID-19 declaration under Section 564(b)(1) of the Act, 21 U.S.C.section 360bbb-3(b)(1), unless the authorization is terminated  or revoked sooner.       Influenza A by PCR NEGATIVE  NEGATIVE Final   Influenza B by PCR NEGATIVE NEGATIVE Final    Comment: (NOTE) The Xpert Xpress SARS-CoV-2/FLU/RSV plus assay is intended as an aid in the diagnosis of influenza from Nasopharyngeal swab specimens and should not be used as a sole basis for treatment. Nasal washings and aspirates are unacceptable for Xpert Xpress SARS-CoV-2/FLU/RSV testing.  Fact Sheet for Patients: BloggerCourse.com  Fact Sheet for Healthcare Providers: SeriousBroker.it  This test is not yet approved or cleared by the [place] FDA and has been authorized for detection and/or diagnosis of SARS-CoV-2 by FDA under an Emergency Use Authorization (EUA). This EUA will remain in effect (meaning this test can be used) for the  duration of the COVID-19 declaration under Section 564(b)(1) of the Act, 21 U.S.C. section 360bbb-3(b)(1), unless the authorization is terminated or revoked.  Performed at [facility], [address], [place], [place] [address]      Radiological Exams on Admission: DG Chest Port 1 View  Result Date: 05/13/2020 CLINICAL DATA:  Dyspnea EXAM: PORTABLE CHEST 1 VIEW COMPARISON:  07/25/2016, CT 12/14/2016 FINDINGS: The lungs are symmetrically hyperinflated in keeping with changes of underlying COPD. Right apical nodule is grossly unchanged from prior CT examination,. No new focal pulmonary nodules or infiltrates. Nipple shadows overlie the lung bases bilaterally. No pneumothorax or pleural effusion. Cardiac size within normal limits. Pulmonary vascularity is normal. No acute bone abnormality. IMPRESSION: No active disease. COPD. Grossly stable right apical pulmonary nodule. Electronically Signed   By: Helyn Numbers MD   On: 05/13/2020 02:21   ECHOCARDIOGRAM COMPLETE  Result Date: 05/13/2020    ECHOCARDIOGRAM REPORT   Patient Name:   Alexandra Mays Date of Exam: 05/13/2020 Medical Rec #:  [id number]         Height:       63.0 in  Accession #:    [id number]        Weight:       80.0 lb Date of Birth:  09/06/1950          BSA:          1.310 m Patient Age:    69 years          BP:           109/66 mmHg Patient Gender: F                 HR:           87 bpm. Exam Location:  Inpatient Procedure: 2D Echo, Cardiac Doppler, Color Doppler and Intracardiac            Opacification Agent Indications:    R07.9* Chest pain, unspecified  History:        Patient has no prior history of Echocardiogram examinations.                 Signs/Symptoms:Chest Pain, Shortness of Breath and Dyspnea.                 Aortic aneurysm repair.  Sonographer:    Sheralyn Boatman RDCS Referring Phys: [id number] [facility] NICOLE DUKE  Sonographer Comments: Technically difficult study due to poor echo windows. Extremely thin habitus, difficult angles. Interrupted multiple times. IMPRESSIONS  1. Left ventricular ejection fraction, by estimation, is 20 to 25%. The left ventricle has severely decreased function.  2. There is severe hypokinesis of all mid-to-apical LV segments. The basal segments have hyperdynamic contractility. 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  3. Left ventricular diastolic parameters are consistent with Grade I diastolic dysfunction (impaired relaxation).  4. Right ventricular systolic function is normal. The right ventricular size is normal.  5. The mitral valve is degenerative. Trivial mitral valve regurgitation.  6. The aortic valve was not well visualized. There is mild calcification of the aortic valve. There is mild thickening of the aortic valve. Aortic valve regurgitation is trivial.  7. The inferior vena cava is dilated in size with <50% respiratory variability, suggesting right atrial pressure of 15 mmHg.  8. Slow flow noted in the IVC. FINDINGS  Left Ventricle: Left ventricular ejection fraction, by estimation, is 20 to 25%. The left ventricle has severely decreased function. The  left ventricle demonstrates regional  wall motion abnormalities. Definity contrast agent was given IV to delineate the left ventricular endocardial borders. There is severe hypokinesis of all mid-to-apical LV segments. The basal segments have hyperdynamic contractility. 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 The left ventricular internal cavity size was normal in size. There is no left ventricular hypertrophy. Left ventricular diastolic parameters are consistent with Grade I diastolic dysfunction (impaired relaxation). Right Ventricle: The right ventricular size is normal. Right vetricular wall thickness was not well visualized. Right ventricular systolic function is normal. Left Atrium: Left atrial size was normal in size. Right Atrium: Right atrial size was normal in size. Pericardium: There is no evidence of pericardial effusion. Mitral Valve: The mitral valve is degenerative in appearance. Trivial mitral valve regurgitation. Tricuspid Valve: The tricuspid valve is normal in structure. Tricuspid valve regurgitation is mild. Aortic Valve: The aortic valve was not well visualized. There is mild calcification of the aortic valve. There is mild thickening of the aortic valve. Aortic valve regurgitation is trivial. Pulmonic Valve: The pulmonic valve was normal in structure. Pulmonic valve regurgitation is trivial. Aorta: Aortic root could not be assessed and the aortic root is normal in size and structure. Venous: The inferior vena cava is dilated in size with less than 50% respiratory variability, suggesting right atrial pressure of 15 mmHg. IAS/Shunts: No atrial level shunt detected by color flow Doppler.  LEFT VENTRICLE PLAX 2D LVIDd:         3.80 cm     Diastology LVIDs:         3.60 cm     LV e' medial:    4.46 cm/s LV PW:         1.00 cm     LV E/e' medial:  13.4 LV IVS:        1.00 cm     LV e' lateral:   4.57 cm/s LVOT diam:     1.80 cm     LV E/e' lateral: 13.1 LV SV:         25 LV SV Index:   19 LVOT Area:      2.54 cm  LV Volumes (MOD) LV vol d, MOD A4C: 65.2 ml LV vol s, MOD A4C: 57.1 ml LV SV MOD A4C:     65.2 ml RIGHT VENTRICLE             IVC RV S prime:     11.10 cm/s  IVC diam: 1.70 cm TAPSE (M-mode): 1.6 cm LEFT ATRIUM             Index      RIGHT ATRIUM          Index LA diam:        2.10 cm 1.60 cm/m RA Area:     6.93 cm LA Vol (A2C):   10.8 ml 8.24 ml/m RA Volume:   12.20 ml 9.31 ml/m LA Vol (A4C):   11.0 ml 8.40 ml/m LA Biplane Vol: 11.7 ml 8.93 ml/m  AORTIC VALVE LVOT Vmax:   65.00 cm/s LVOT Vmean:  40.800 cm/s LVOT VTI:    0.100 m  AORTA Ao Root diam: 2.80 cm MITRAL VALVE MV Area (PHT): 5.38 cm    SHUNTS MV Decel Time: 141 msec    Systemic VTI:  0.10 m MV E velocity: 59.70 cm/s  Systemic Diam: 1.80 cm MV A velocity: 77.10 cm/s MV E/A ratio:  0.77 Laurance Flatten MD Electronically signed by Laurance Flatten MD Signature Date/Time:  05/13/2020/2:48:10 PM    Final    CT ANGIO CHEST/ABD/PEL FOR DISSECTION W &/OR WO CONTRAST  Result Date: 05/13/2020 CLINICAL DATA:  Abdominal pain with concern for aortic dissection EXAM: CT ANGIOGRAPHY CHEST, ABDOMEN AND PELVIS TECHNIQUE: Non-contrast CT of the chest was initially obtained. Multidetector CT imaging through the chest, abdomen and pelvis was performed using the standard protocol during bolus administration of intravenous contrast. Multiplanar reconstructed images and MIPs were obtained and reviewed to evaluate the vascular anatomy. CONTRAST:  OMNIPAQUE IOHEXOL 350 MG/ML SOLN COMPARISON:  06/23/2016. 12/14/2016 chest CT and 08/15/2016 abdominal CT FINDINGS: CTA CHEST FINDINGS Cardiovascular: No acute intramural hematoma on the noncontrast phase. Normal heart size. No pericardial effusion. Severe atheromatous disease of the aorta, great vessels, and coronaries. 36 mm diameter descending thoracic aorta just above the hiatus. This includes a ventral predominant bulge which shows progressive mural thrombus since prior. Additional notable rightward bulging  just below the isthmus with total diameter of 34 mm at this level, non progressed from 2018. No pulmonary artery filling defect. Mediastinum/Nodes: No hematoma or air leak Lungs/Pleura: Airway plugging in the right lower lobe with mild atelectasis and airspace opacity. Centrilobular emphysema with hyperinflation. Chronic calcified nodule in the right upper lobe attributed to old granulomatous disease. Musculoskeletal: No acute finding Review of the MIP images confirms the above findings. CTA ABDOMEN AND PELVIS FINDINGS VASCULAR Aorta: Severe atherosclerosis. In 2018 there was aortic aneurysm repair with left iliac and right femoral grafting. Mural high-density at the upper margin of the graft is attributed to suture material. No evidence of leaking or adjacent aneurysmal dilatation. Diffuse attenuation of the iliac arteries from atherosclerosis. Bilateral inflow is patent. 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 No acute occlusion or aneurysm. SMA: Less than 50% atheromatous narrowing at the proximal SMA. No branch occlusion, beading, or aneurysm. Renals: Accessory right lower pole renal artery. Heavily calcified left renal artery ostium with 50% stenosis by coronal reformats. No acute finding. IMA: Likely sacrificed. Inflow: As above Veins: Negative in the arterial phase Review of the MIP images confirms the above findings. NON-VASCULAR Hepatobiliary: Vague high-density area in the central liver which is likely perfusional.No evidence of biliary obstruction or stone. Pancreas: Unremarkable. Spleen: Unremarkable. Adrenals/Urinary Tract: Negative adrenals. No hydronephrosis or stone. Unremarkable bladder. Stomach/Bowel:  No obstruction. No appendicitis. Vascular/Lymphatic: No acute vascular abnormality. No mass or adenopathy. Reproductive:No pathologic findings. Other: No ascites or pneumoperitoneum. Musculoskeletal: No acute abnormalities. Advanced lumbar spine degeneration with levoscoliosis  and reversal of lumbar lordosis. Review of the MIP images confirms the above findings. IMPRESSION: 1. No acute vascular finding. 2. Descending thoracic aortic aneurysms measuring up to 3.6 cm in diameter. 3. Treated abdominal aortic aneurysm. No interval or adverse finding. 4. Aspiration or mucous plugging in the right lower lobe mild atelectasis. 5. Aortic Atherosclerosis (ICD10-I70.0) and Emphysema (ICD10-J43.9), both advanced. 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 Electronically Signed   By: Marnee Spring M.D.   On: 05/13/2020 08:50    EKG: Independently reviewed.  Normal sinus rhythm 86/min with PVC and old anterior infarct  Assessment/Plan NSTEMI 2/2 Takotsubo syndrome: Acute.  Patient presented with complaints of shortness of breath and burning chest discomfort.  EKG without significant ischemic changes.  High-sensitivity troponin 75->422->675. Cardiac cath showed nonobstructive disease therefore suspected to be a Takotsubo syndrome.  Heparin drip was stopped. -Admit to a cardiac telemetry bed -Continue aspirin, beta-blocker, and statin -Appreciate cardiology consultative services, we will follow-up for  further recommendations  Acute respiratory failure with hypoxia secondary to COPD: Patient presented with O2 saturations initially 84% on room air.  CT had a chest, abdomen, and pelvis significant for possible mucous plugging versus aspiration of the right lower lobe.  Home inhalers include Breo Ellipta 1 puff daily, Spiriva 2 puffs daily, and albuterol inhaler/nebs as needed -Continuous pulse oximetry and oxygen as needed to maintain O2 saturations greater than 90%. -Continue home inhalers -Prednisone 40 mg daily -Azithromycin -Albuterol nebs 4 times daily needed for shortness of breath/wheezing  Systolic congestive heart failure: Echocardiogram revealed EF noted to be around 20 to 25% with regional wall abnormalities.  Overall patient appears to be  euvolemic and did not require IV diuresis. -Continue beta-blocker  Essential hypertension: Blood pressures stable. -Coreg 3.125 twice daily  Hyperlipidemia: Home medications previously included simvastatin 20 mg daily.  LDL 115. -Goal LDL<100 -atorvastatin 80 mg daily  Normocytic anemia: Acute.  Hemoglobin 11.8 g/dL on repeat check today, but previously had been 12.7 g/dL on 3/3. -Recheck hemoglobin in a.m.  Leukocytosis: Resolved.  Initially on admission WBC elevated 11.1, but repeat check 8.8.  Patient has remained afebrile.  Suspect initial elevation was secondary to acute stress response. -Continue to monitor  AAA: Patient with 3.6 cm abdominal aortic aneurysm with prior treated abdominal aortic aneurysm. -Continue yearly surveillance  Tobacco abuse: Patient reports approximately a 55 smoking pack year history. -Nicotine patch offered -Counseled need of cessation of tobacco use  DVT prophylaxis: Heparin Code Status: Full Family Communication: None Disposition Plan: Hopefully discharge home in 1 to 2 days Consults called: Cardiology Admission status: Inpatient, require more than 2 midnight stay  Clydie Braunondell A Smith MD [facility]   If 7PM-7AM, please contact night-coverage   05/14/2020, 9:54 AM

## 2020-05-14 NOTE — Interval H&P Note (Signed)
History and Physical Interval Note:  05/14/2020 1:35 PM  Alexandra Mays  has presented today for surgery, with the diagnosis of nstemi.  The various methods of treatment have been discussed with the patient and family. After consideration of risks, benefits and other options for treatment, the patient has consented to  Procedure(s): RIGHT/LEFT HEART CATH AND CORONARY ANGIOGRAPHY (N/A) as a surgical intervention.  The patient's history has been reviewed, patient examined, no change in status, stable for surgery.  I have reviewed the patient's chart and labs.  Questions were answered to the patient's satisfaction.     Tonny Bollman

## 2020-05-14 NOTE — ED Notes (Signed)
purwick replaced and peri care given.

## 2020-05-14 NOTE — ED Notes (Signed)
Cath lab and pharmacy at bedside.

## 2020-05-14 NOTE — H&P (View-Only) (Signed)
Cardiology Progress Note  Patient ID: Alexandra Mays MRN: [id number] DOB: 04-Oct-1950 Date of Encounter: 05/14/2020  Primary Cardiologist: No primary care provider on file.  Subjective   Chief Complaint: Shortness of breath with exertion  HPI: Still no chest pain.  Plans for left heart cath today.  Lung sounds with rhonchi on exam.  ROS:  All other ROS reviewed and negative. Pertinent positives noted in the HPI.     Inpatient Medications  Scheduled Meds: . aspirin EC  81 mg Oral Daily  . atorvastatin  80 mg Oral Daily  . carvedilol  3.125 mg Oral BID WC  . umeclidinium bromide  1 puff Inhalation Daily   Continuous Infusions: . heparin 600 Units/hr (05/13/20 1954)   PRN Meds:    Vital Signs   Vitals:   05/14/20 0400 05/14/20 0500 05/14/20 0600 05/14/20 0700  BP: (!) 106/59 114/63  115/78  Pulse: 73 72 75 91  Resp: 20 19 (!) 21 18  Temp:      TempSrc:      SpO2: 95% 97% 95% 93%   No intake or output data in the 24 hours ending 05/14/20 0837 Last 3 Weights 10/31/2019 12/14/2016 08/16/2016  Weight (lbs) 80 lb 85 lb 14.4 oz 85 lb  Weight (kg) 36.288 kg 38.964 kg 38.556 kg      Telemetry  Overnight telemetry shows sinus rhythm in the 80s, which I personally reviewed.   ECG  The most recent ECG shows normal sinus rhythm heart rate 86, PVC noted, anterior infarct, which I personally reviewed.   Physical Exam   Vitals:   05/14/20 0400 05/14/20 0500 05/14/20 0600 05/14/20 0700  BP: (!) 106/59 114/63  115/78  Pulse: 73 72 75 91  Resp: 20 19 (!) 21 18  Temp:      TempSrc:      SpO2: 95% 97% 95% 93%   No intake or output data in the 24 hours ending 05/14/20 0837  Last 3 Weights 10/31/2019 12/14/2016 08/16/2016  Weight (lbs) 80 lb 85 lb 14.4 oz 85 lb  Weight (kg) 36.288 kg 38.964 kg 38.556 kg    There is no height or weight on file to calculate BMI.   General: Thin, cachexia noted Head: Atraumatic, normal size  Eyes: PEERLA, EOMI  Neck: Supple, no JVD Endocrine: No  thryomegaly Cardiac: Normal S1, S2; RRR; no murmurs, rubs, or gallops Lungs: Diffuse rhonchi bilaterally Abd: Soft, nontender, no hepatomegaly  Ext: No edema, pulses 2+ Musculoskeletal: No deformities, BUE and BLE strength normal and equal Skin: Warm and dry, no rashes   Neuro: Alert and oriented to person, place, time, and situation, CNII-XII grossly intact, no focal deficits  Psych: Normal mood and affect   Labs  High Sensitivity Troponin:   Recent Labs  Lab 05/13/20 0158 05/13/20 0358 05/13/20 1103 05/13/20 1328  TROPONINIHS 75* 422* 675* 956*     Cardiac EnzymesNo results for input(s): TROPONINI in the last 168 hours. No results for input(s): TROPIPOC in the last 168 hours.  Chemistry Recent Labs  Lab 05/13/20 0158  NA 140  K 4.6  CL 106  CO2 21*  GLUCOSE 122*  BUN 15  CREATININE 0.87  CALCIUM 9.0  GFRNONAA >60  ANIONGAP 13    Hematology Recent Labs  Lab 05/13/20 0158 05/14/20 0240  WBC 11.1* 8.8  RBC 4.08 3.75*  HGB 12.7 11.8*  HCT 40.2 35.2*  MCV 98.5 93.9  MCH 31.1 31.5  MCHC 31.6 33.5  RDW 13.1 13.2  PLT 152 158   BNP Recent Labs  Lab 05/13/20 0158  BNP 81.4    DDimer No results for input(s): DDIMER in the last 168 hours.   Radiology  DG Chest Port 1 View  Result Date: 05/13/2020 CLINICAL DATA:  Dyspnea EXAM: PORTABLE CHEST 1 VIEW COMPARISON:  07/25/2016, CT 12/14/2016 FINDINGS: The lungs are symmetrically hyperinflated in keeping with changes of underlying COPD. Right apical nodule is grossly unchanged from prior CT examination,. No new focal pulmonary nodules or infiltrates. Nipple shadows overlie the lung bases bilaterally. No pneumothorax or pleural effusion. Cardiac size within normal limits. Pulmonary vascularity is normal. No acute bone abnormality. IMPRESSION: No active disease. COPD. Grossly stable right apical pulmonary nodule. Electronically Signed   By: Helyn Numbers MD   On: 05/13/2020 02:21   ECHOCARDIOGRAM COMPLETE  Result Date:  05/13/2020    ECHOCARDIOGRAM REPORT   Patient Name:   Alexandra Mays Date of Exam: 05/13/2020 Medical Rec #:  [id number]         Height:       63.0 in Accession #:    [id number]        Weight:       80.0 lb Date of Birth:  Jul 02, 1950          BSA:          1.310 m Patient Age:    69 years          BP:           109/66 mmHg Patient Gender: F                 HR:           87 bpm. Exam Location:  Inpatient Procedure: 2D Echo, Cardiac Doppler, Color Doppler and Intracardiac            Opacification Agent Indications:    R07.9* Chest pain, unspecified  History:        Patient has no prior history of Echocardiogram examinations.                 Signs/Symptoms:Chest Pain, Shortness of Breath and Dyspnea.                 Aortic aneurysm repair.  Sonographer:    Sheralyn Boatman RDCS Referring Phys: [id number] [facility] NICOLE DUKE  Sonographer Comments: Technically difficult study due to poor echo windows. Extremely thin habitus, difficult angles. Interrupted multiple times. IMPRESSIONS  1. Left ventricular ejection fraction, by estimation, is 20 to 25%. The left ventricle has severely decreased function.  2. There is severe hypokinesis of all mid-to-apical LV segments. The basal segments have hyperdynamic contractility. 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  3. Left ventricular diastolic parameters are consistent with Grade I diastolic dysfunction (impaired relaxation).  4. Right ventricular systolic function is normal. The right ventricular size is normal.  5. The mitral valve is degenerative. Trivial mitral valve regurgitation.  6. The aortic valve was not well visualized. There is mild calcification of the aortic valve. There is mild thickening of the aortic valve. Aortic valve regurgitation is trivial.  7. The inferior vena cava is dilated in size with <50% respiratory variability, suggesting right atrial pressure of 15 mmHg.  8. Slow flow noted in the IVC. FINDINGS  Left Ventricle: Left  ventricular ejection fraction, by estimation, is 20 to 25%. The left ventricle has severely decreased function. The left ventricle demonstrates regional wall motion abnormalities. Definity contrast agent was  given IV to delineate the left ventricular endocardial borders. There is severe hypokinesis of all mid-to-apical LV segments. The basal segments have hyperdynamic contractility. 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 The left ventricular internal cavity size was normal in size. There is no left ventricular hypertrophy. Left ventricular diastolic parameters are consistent with Grade I diastolic dysfunction (impaired relaxation). Right Ventricle: The right ventricular size is normal. Right vetricular wall thickness was not well visualized. Right ventricular systolic function is normal. Left Atrium: Left atrial size was normal in size. Right Atrium: Right atrial size was normal in size. Pericardium: There is no evidence of pericardial effusion. Mitral Valve: The mitral valve is degenerative in appearance. Trivial mitral valve regurgitation. Tricuspid Valve: The tricuspid valve is normal in structure. Tricuspid valve regurgitation is mild. Aortic Valve: The aortic valve was not well visualized. There is mild calcification of the aortic valve. There is mild thickening of the aortic valve. Aortic valve regurgitation is trivial. Pulmonic Valve: The pulmonic valve was normal in structure. Pulmonic valve regurgitation is trivial. Aorta: Aortic root could not be assessed and the aortic root is normal in size and structure. Venous: The inferior vena cava is dilated in size with less than 50% respiratory variability, suggesting right atrial pressure of 15 mmHg. IAS/Shunts: No atrial level shunt detected by color flow Doppler.  LEFT VENTRICLE PLAX 2D LVIDd:         3.80 cm     Diastology LVIDs:         3.60 cm     LV e' medial:    4.46 cm/s LV PW:         1.00 cm     LV E/e' medial:  13.4 LV  IVS:        1.00 cm     LV e' lateral:   4.57 cm/s LVOT diam:     1.80 cm     LV E/e' lateral: 13.1 LV SV:         25 LV SV Index:   19 LVOT Area:     2.54 cm  LV Volumes (MOD) LV vol d, MOD A4C: 65.2 ml LV vol s, MOD A4C: 57.1 ml LV SV MOD A4C:     65.2 ml RIGHT VENTRICLE             IVC RV S prime:     11.10 cm/s  IVC diam: 1.70 cm TAPSE (M-mode): 1.6 cm LEFT ATRIUM             Index      RIGHT ATRIUM          Index LA diam:        2.10 cm 1.60 cm/m RA Area:     6.93 cm LA Vol (A2C):   10.8 ml 8.24 ml/m RA Volume:   12.20 ml 9.31 ml/m LA Vol (A4C):   11.0 ml 8.40 ml/m LA Biplane Vol: 11.7 ml 8.93 ml/m  AORTIC VALVE LVOT Vmax:   65.00 cm/s LVOT Vmean:  40.800 cm/s LVOT VTI:    0.100 m  AORTA Ao Root diam: 2.80 cm MITRAL VALVE MV Area (PHT): 5.38 cm    SHUNTS MV Decel Time: 141 msec    Systemic VTI:  0.10 m MV E velocity: 59.70 cm/s  Systemic Diam: 1.80 cm MV A velocity: 77.10 cm/s MV E/A ratio:  0.77 Laurance Flatten MD Electronically signed by Laurance Flatten MD Signature Date/Time: 05/13/2020/2:48:10 PM    Final    CT ANGIO CHEST/ABD/PEL  FOR DISSECTION W &/OR WO CONTRAST  Result Date: 05/13/2020 CLINICAL DATA:  Abdominal pain with concern for aortic dissection EXAM: CT ANGIOGRAPHY CHEST, ABDOMEN AND PELVIS TECHNIQUE: Non-contrast CT of the chest was initially obtained. Multidetector CT imaging through the chest, abdomen and pelvis was performed using the standard protocol during bolus administration of intravenous contrast. Multiplanar reconstructed images and MIPs were obtained and reviewed to evaluate the vascular anatomy. CONTRAST:  100mL OMNIPAQUE IOHEXOL 350 MG/ML SOLN COMPARISON:  06/23/2016. 12/14/2016 chest CT and 08/15/2016 abdominal CT FINDINGS: CTA CHEST FINDINGS Cardiovascular: No acute intramural hematoma on the noncontrast phase. Normal heart size. No pericardial effusion. Severe atheromatous disease of the aorta, great vessels, and coronaries. 36 mm diameter descending thoracic aorta  just above the hiatus. This includes a ventral predominant bulge which shows progressive mural thrombus since prior. Additional notable rightward bulging just below the isthmus with total diameter of 34 mm at this level, non progressed from 2018. No pulmonary artery filling defect. Mediastinum/Nodes: No hematoma or air leak Lungs/Pleura: Airway plugging in the right lower lobe with mild atelectasis and airspace opacity. Centrilobular emphysema with hyperinflation. Chronic calcified nodule in the right upper lobe attributed to old granulomatous disease. Musculoskeletal: No acute finding Review of the MIP images confirms the above findings. CTA ABDOMEN AND PELVIS FINDINGS VASCULAR Aorta: Severe atherosclerosis. In 2018 there was aortic aneurysm repair with left iliac and right femoral grafting. Mural high-density at the upper margin of the graft is attributed to suture material. No evidence of leaking or adjacent aneurysmal dilatation. Diffuse attenuation of the iliac arteries from atherosclerosis. Bilateral inflow is patent. 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 No acute occlusion or aneurysm. SMA: Less than 50% atheromatous narrowing at the proximal SMA. No branch occlusion, beading, or aneurysm. Renals: Accessory right lower pole renal artery. Heavily calcified left renal artery ostium with 50% stenosis by coronal reformats. No acute finding. IMA: Likely sacrificed. Inflow: As above Veins: Negative in the arterial phase Review of the MIP images confirms the above findings. NON-VASCULAR Hepatobiliary: Vague high-density area in the central liver which is likely perfusional.No evidence of biliary obstruction or stone. Pancreas: Unremarkable. Spleen: Unremarkable. Adrenals/Urinary Tract: Negative adrenals. No hydronephrosis or stone. Unremarkable bladder. Stomach/Bowel:  No obstruction. No appendicitis. Vascular/Lymphatic: No acute vascular abnormality. No mass or adenopathy. Reproductive:No  pathologic findings. Other: No ascites or pneumoperitoneum. Musculoskeletal: No acute abnormalities. Advanced lumbar spine degeneration with levoscoliosis and reversal of lumbar lordosis. Review of the MIP images confirms the above findings. IMPRESSION: 1. No acute vascular finding. 2. Descending thoracic aortic aneurysms measuring up to 3.6 cm in diameter. 3. Treated abdominal aortic aneurysm. No interval or adverse finding. 4. Aspiration or mucous plugging in the right lower lobe mild atelectasis. 5. Aortic Atherosclerosis (ICD10-I70.0) and Emphysema (ICD10-J43.9), both advanced. 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 Electronically Signed   By: Jonathon  Watts M.D.   On: 05/13/2020 08:50    Cardiac Studies  TTE 05/13/2020 1. Left ventricular ejection fraction, by estimation, is 20 to 25%. The  left ventricle has severely decreased function.  2. There is severe hypokinesis of all mid-to-apical LV segments. The  basal segments have hyperdynamic contractility. Findings concerning for  Takotsubo cardiomyopathy versus a wrap-around LAD lesion.  3. Left ventricular diastolic parameters are consistent with Grade I  diastolic dysfunction (impaired relaxation).  4. Right ventricular systolic function is normal. The right ventricular  size is normal.  5. The mitral valve is degenerative. Trivial   mitral valve regurgitation.  6. The aortic valve was not well visualized. There is mild calcification  of the aortic valve. There is mild thickening of the aortic valve. Aortic  valve regurgitation is trivial.  7. The inferior vena cava is dilated in size with <50% respiratory  variability, suggesting right atrial pressure of 15 mmHg.  8. Slow flow noted in the IVC.   Patient Profile  Alexandra Mays is a 70 y.o. female with remote history of PCI in 2007, COPD, tobacco abuse, descending aortic aneurysm, AAA status post repair who was admitted on 05/13/2020 with  acute shortness of breath and chest pain.  Found to have non-STEMI.  Also with significant cachexia and COPD.  Assessment & Plan   1. NSTEMI/Systolic HF, EF 20-25% w/RWMA -Admitted with acute onset chest pain.  EKG is unchanged which shows old anterior infarct.  She had a remote history of PCI 2007.  She had a stress test in 2018 that was reported as normal.  I do not have the results of that. -She has significant COPD, weight loss and cachexia. -She has mucous plugging in her right middle lobe. -Her echocardiogram is consistent with either a large LAD infarction versus a stress-induced cardiomyopathy.  Overall given her minimal troponin elevation for her level of LV dysfunction I favor a stress-induced cardiomyopathy.  We will nonetheless pursue a left heart catheterization today. -Continue aspirin heparin drip. -Beta-blocker has been added -BNP negative.  She appears euvolemic.  I think her IVC is dilated due to her COPD. -We will add ACE/ARB tomorrow. -Continue high intensity statin  2.  Descending aortic aneurysm 3.6 cm/prior AAA repair/celiac artery stenosis -Appears to be stable.  No concern for acute aortic pathology -No symptoms of mesenteric ischemia -Continue aspirin and statin. -She will need vascular follow-up.  3.  Diffuse rhonchi/COPD/mucous plugging -Management per hospital medicine.  Disposition: The cardiology service provided a consultation yesterday.  On 05/13/2020 ~12:35 pm we communicated with the emergency room that this patient should be admitted to hospital medicine we will consult.   It appears overnight there was an issue with who she admit the patient.  The patient has significant COPD and weight loss and anorexia.  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  This patient is not appropriate for cardiology admission as primary service.  This was discussed this morning with hospital medicine.  They have agreed to admit the patient.  We do appreciate their  assistance.  Shared Decision Making/Informed Consent The risks [stroke (1 in 1000), death (1 in 1000), kidney failure [usually temporary] (1 in 500), bleeding (1 in 200), allergic reaction [possibly serious] (1 in 200)], benefits (diagnostic support and management of coronary artery disease) and alternatives of a cardiac catheterization were discussed in detail with [name] and she is willing to proceed.  For questions or updates, please contact [facility] Please consult [email or web address] for contact info under   Time Spent with Patient: I have spent a total of 25 minutes with patient reviewing hospital notes, telemetry, EKGs, labs and examining the patient as well as establishing an assessment and plan that was discussed with the patient.  > 50% of time was spent in direct patient care.    Signed, Lenna GilfordWesley T. Flora Lipps'Neal, MD [facility]  [facility]  05/14/2020 8:37 AM

## 2020-05-15 DIAGNOSIS — I5181 Takotsubo syndrome: Secondary | ICD-10-CM | POA: Diagnosis not present

## 2020-05-15 DIAGNOSIS — I214 Non-ST elevation (NSTEMI) myocardial infarction: Principal | ICD-10-CM

## 2020-05-15 LAB — BASIC METABOLIC PANEL
Anion gap: 8 (ref 5–15)
BUN: 26 mg/dL — ABNORMAL HIGH (ref 8–23)
CO2: 19 mmol/L — ABNORMAL LOW (ref 22–32)
Calcium: 8.7 mg/dL — ABNORMAL LOW (ref 8.9–10.3)
Chloride: 107 mmol/L (ref 98–111)
Creatinine, Ser: 0.8 mg/dL (ref 0.44–1.00)
GFR, Estimated: >60 mL/min (ref >60)
Glucose, Bld: 139 mg/dL — ABNORMAL HIGH (ref 70–99)
Potassium: 4.2 mmol/L (ref 3.5–5.1)
Sodium: 134 mmol/L — ABNORMAL LOW (ref 135–145)

## 2020-05-15 MED ORDER — PREDNISONE 20 MG PO TABS
30.0000 mg | ORAL_TABLET | Freq: Every day | ORAL | Status: DC
  Administered 2020-05-16: 30 mg via ORAL
  Filled 2020-05-15: qty 1

## 2020-05-15 MED ORDER — ENOXAPARIN SODIUM 30 MG/0.3ML ~~LOC~~ SOLN: 30.0000 mg | SUBCUTANEOUS | Status: DC

## 2020-05-15 MED ORDER — NICOTINE 14 MG/24HR TD PT24
14.0000 mg | MEDICATED_PATCH | Freq: Every day | TRANSDERMAL | Status: DC
  Administered 2020-05-15 – 2020-05-16 (×2): 14 mg via TRANSDERMAL
  Filled 2020-05-15 (×2): qty 1

## 2020-05-15 MED ORDER — ENOXAPARIN SODIUM 40 MG/0.4ML ~~LOC~~ SOLN
40.0000 mg | SUBCUTANEOUS | Status: DC
  Administered 2020-05-15: 40 mg via SUBCUTANEOUS
  Filled 2020-05-15: qty 0.4

## 2020-05-15 MED ORDER — ALPRAZOLAM 0.5 MG PO TABS
0.5000 mg | ORAL_TABLET | Freq: Two times a day (BID) | ORAL | Status: DC | PRN
  Administered 2020-05-15: 0.5 mg via ORAL
  Filled 2020-05-15: qty 1

## 2020-05-15 MED ORDER — ALUM & MAG HYDROXIDE-SIMETH 200-200-20 MG/5ML PO SUSP: 30.0000 mL | ORAL | Status: DC | PRN

## 2020-05-15 NOTE — Progress Notes (Signed)
Progress Note  Patient Name: Alexandra Mays Date of Encounter: 05/15/2020  Primary Cardiologist: Garwin Brothers, MD  Subjective   Wants to go home.  Does not report any chest pain or breathlessness at rest.  Inpatient Medications    Scheduled Meds: . albuterol  2.5 mg Nebulization QID  . aspirin EC  81 mg Oral Daily  . atorvastatin  80 mg Oral Daily  . azithromycin  500 mg Oral Daily  . carvedilol  3.125 mg Oral BID WC  . enoxaparin (LOVENOX) injection  40 mg Subcutaneous Q24H  . fluticasone furoate-vilanterol  1 puff Inhalation Daily  . guaiFENesin  600 mg Oral BID  . losartan  12.5 mg Oral Daily  . nicotine  14 mg Transdermal Daily  . [START ON 05/16/2020] predniSONE  30 mg Oral Q breakfast  . sodium chloride flush  3 mL Intravenous Q12H  . umeclidinium bromide  1 puff Inhalation Daily   Continuous Infusions: . sodium chloride     PRN Meds: sodium chloride, acetaminophen **OR** acetaminophen, albuterol, ALPRAZolam, nitroGLYCERIN, ondansetron **OR** ondansetron (ZOFRAN) IV, sodium chloride flush   Vital Signs    Vitals:   05/15/20 0026 05/15/20 0118 05/15/20 0438 05/15/20 0748  BP: 108/73 (!) 95/57 99/63 (!) 111/58  Pulse:    77  Resp: 18 18 15 16   Temp: 97.7 F (36.5 C)  97.7 F (36.5 C)   TempSrc: Oral  Oral   SpO2:    94%  Weight:   37.8 kg   Height:        Intake/Output Summary (Last 24 hours) at 05/15/2020 1212 Last data filed at 05/15/2020 0400 Gross per 24 hour  Intake 281.48 ml  Output -  Net 281.48 ml   Filed Weights   05/14/20 0918 05/15/20 0438  Weight: 37.2 kg 37.8 kg    Telemetry    Sinus rhythm.  Personally reviewed.  ECG    An ECG dated 05/13/2020 was personally reviewed today and demonstrated:  Sinus rhythm with possible right atrial enlargement and PVC.  Physical Exam   GEN:  Frail-appearing woman, no acute distress.   Neck: No JVD. Cardiac: RRR, no murmur or gallop.  Respiratory: Nonlabored. Clear to auscultation  bilaterally. GI: Soft, nontender, bowel sounds present. MS: No edema; No deformity.  Labs    Chemistry Recent Labs  Lab 05/13/20 0158 05/14/20 1347 05/14/20 1354 05/15/20 0352  NA 140 140 143 134*  K 4.6 3.7 3.5 4.2  CL 106  --   --  107  CO2 21*  --   --  19*  GLUCOSE 122*  --   --  139*  BUN 15  --   --  26*  CREATININE 0.87  --   --  0.80  CALCIUM 9.0  --   --  8.7*  GFRNONAA >60  --   --  >60  ANIONGAP 13  --   --  8     Hematology Recent Labs  Lab 05/13/20 0158 05/14/20 0240 05/14/20 1347 05/14/20 1354  WBC 11.1* 8.8  --   --   RBC 4.08 3.75*  --   --   HGB 12.7 11.8* 12.6 12.2  HCT 40.2 35.2* 37.0 36.0  MCV 98.5 93.9  --   --   MCH 31.1 31.5  --   --   MCHC 31.6 33.5  --   --   RDW 13.1 13.2  --   --   PLT 152 158  --   --  Cardiac Enzymes Recent Labs  Lab 05/13/20 0158 05/13/20 0358 05/13/20 1103 05/13/20 1328  TROPONINIHS 75* 422* 675* 956*    BNP Recent Labs  Lab 05/13/20 0158  BNP 81.4     Radiology    CARDIAC CATHETERIZATION  Result Date: 05/14/2020  The left ventricular ejection fraction is 25-35% by visual estimate.  1.  Patent RCA stent with mild in-stent restenosis 2.  Patent left main 3.  Patent LAD with mild to moderate nonobstructive stenosis in the mid vessel and a segment of intramyocardial bridging 4.  Patent left circumflex with minimal nonobstructive disease 5.  Severe LV dysfunction with contractile pattern consistent with acute Takotsubo syndrome/periapical ballooning, vigorous basal contraction, LVEF less than 35%   ECHOCARDIOGRAM COMPLETE  Result Date: 05/13/2020    ECHOCARDIOGRAM REPORT   Patient Name:   Alexandra Mays Date of Exam: 05/13/2020 Medical Rec #:  [id number]         Height:       63.0 in Accession #:    [id number]        Weight:       80.0 lb Date of Birth:  [birth year]          BSA:          1.310 m Patient Age:    70 years          BP:           109/66 mmHg Patient Gender: F                 HR:           87  bpm. Exam Location:  Inpatient Procedure: 2D Echo, Cardiac Doppler, Color Doppler and Intracardiac            Opacification Agent Indications:    R07.9* Chest pain, unspecified  History:        Patient has no prior history of Echocardiogram examinations.                 Signs/Symptoms:Chest Pain, Shortness of Breath and Dyspnea.                 Aortic aneurysm repair.  Sonographer:    Sheralyn Boatman RDCS Referring Phys: [id number] [facility] NICOLE DUKE  Sonographer Comments: Technically difficult study due to poor echo windows. Extremely thin habitus, difficult angles. Interrupted multiple times. IMPRESSIONS  1. Left ventricular ejection fraction, by estimation, is 20 to 25%. The left ventricle has severely decreased function.  2. There is severe hypokinesis of all mid-to-apical LV segments. The basal segments have hyperdynamic contractility. 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  3. Left ventricular diastolic parameters are consistent with Grade I diastolic dysfunction (impaired relaxation).  4. Right ventricular systolic function is normal. The right ventricular size is normal.  5. The mitral valve is degenerative. Trivial mitral valve regurgitation.  6. The aortic valve was not well visualized. There is mild calcification of the aortic valve. There is mild thickening of the aortic valve. Aortic valve regurgitation is trivial.  7. The inferior vena cava is dilated in size with <50% respiratory variability, suggesting right atrial pressure of 15 mmHg.  8. Slow flow noted in the IVC. FINDINGS  Left Ventricle: Left ventricular ejection fraction, by estimation, is 20 to 25%. The left ventricle has severely decreased function. The left ventricle demonstrates regional wall motion abnormalities. Definity contrast agent was given IV to delineate the left ventricular endocardial borders. There is severe hypokinesis of  all mid-to-apical LV segments. The basal segments have hyperdynamic  contractility. 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 The left ventricular internal cavity size was normal in size. There is no left ventricular hypertrophy. Left ventricular diastolic parameters are consistent with Grade I diastolic dysfunction (impaired relaxation). Right Ventricle: The right ventricular size is normal. Right vetricular wall thickness was not well visualized. Right ventricular systolic function is normal. Left Atrium: Left atrial size was normal in size. Right Atrium: Right atrial size was normal in size. Pericardium: There is no evidence of pericardial effusion. Mitral Valve: The mitral valve is degenerative in appearance. Trivial mitral valve regurgitation. Tricuspid Valve: The tricuspid valve is normal in structure. Tricuspid valve regurgitation is mild. Aortic Valve: The aortic valve was not well visualized. There is mild calcification of the aortic valve. There is mild thickening of the aortic valve. Aortic valve regurgitation is trivial. Pulmonic Valve: The pulmonic valve was normal in structure. Pulmonic valve regurgitation is trivial. Aorta: Aortic root could not be assessed and the aortic root is normal in size and structure. Venous: The inferior vena cava is dilated in size with less than 50% respiratory variability, suggesting right atrial pressure of 15 mmHg. IAS/Shunts: No atrial level shunt detected by color flow Doppler.  LEFT VENTRICLE PLAX 2D LVIDd:         3.80 cm     Diastology LVIDs:         3.60 cm     LV e' medial:    4.46 cm/s LV PW:         1.00 cm     LV E/e' medial:  13.4 LV IVS:        1.00 cm     LV e' lateral:   4.57 cm/s LVOT diam:     1.80 cm     LV E/e' lateral: 13.1 LV SV:         25 LV SV Index:   19 LVOT Area:     2.54 cm  LV Volumes (MOD) LV vol d, MOD A4C: 65.2 ml LV vol s, MOD A4C: 57.1 ml LV SV MOD A4C:     65.2 ml RIGHT VENTRICLE             IVC RV S prime:     11.10 cm/s  IVC diam: 1.70 cm TAPSE (M-mode): 1.6 cm LEFT  ATRIUM             Index      RIGHT ATRIUM          Index LA diam:        2.10 cm 1.60 cm/m RA Area:     6.93 cm LA Vol (A2C):   10.8 ml 8.24 ml/m RA Volume:   12.20 ml 9.31 ml/m LA Vol (A4C):   11.0 ml 8.40 ml/m LA Biplane Vol: 11.7 ml 8.93 ml/m  AORTIC VALVE LVOT Vmax:   65.00 cm/s LVOT Vmean:  40.800 cm/s LVOT VTI:    0.100 m  AORTA Ao Root diam: 2.80 cm MITRAL VALVE MV Area (PHT): 5.38 cm    SHUNTS MV Decel Time: 141 msec    Systemic VTI:  0.10 m MV E velocity: 59.70 cm/s  Systemic Diam: 1.80 cm MV A velocity: 77.10 cm/s MV E/A ratio:  0.77 Laurance FlattenHeather Pemberton MD Electronically signed by Laurance FlattenHeather Pemberton MD Signature Date/Time: 05/13/2020/2:48:10 PM    Final     Patient Profile     70 y.o. female with remote history of PCI in  2007, COPD, tobacco abuse, descending aortic aneurysm, AAA status post repair who was admitted on 05/13/2020 with acute shortness of breath and chest pain.  Found to have non-STEMI.  Also with significant cachexia and COPD.  Assessment & Plan    1.  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  LVEF 20 to 25% range.  Cardiac catheterization demonstrated patent RCA stent site with otherwise mild to moderate nonobstructive disease and some intramyocardial bridging of the LAD.  She is chest pain-free.  High-sensitivity troponin I level up to 956.  Currently on aspirin, Lipitor, Coreg, and losartan.  2.  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  Asymptomatic at this time.  3.  COPD.  Continue to titrate goal-directed medical therapy.  Low normal blood pressure precludes further up titration at this time.  Currently on aspirin, Lipitor, Coreg, and losartan.  May be able to add Aldactone next.  Signed, Nona Dell, MD  05/15/2020, 12:12 PM

## 2020-05-15 NOTE — Progress Notes (Signed)
C/O SOB after eating a few bites of food. SpO2% = 96% on RA. Placed on O2@2LNC  for comfort. EKG done. Cards fellow notified. No new orders received. Will cont to monitor.

## 2020-05-15 NOTE — Progress Notes (Signed)
Increased anxiety and frustration. Discharge held to observe over night with changes in medications. Nicotine patch increased to 14 mg and low dose xanax ordered as well. Verbalized understanding. Call bell in reach.

## 2020-05-15 NOTE — Progress Notes (Signed)
PROGRESS NOTE    Alexandra Mays  RCB:[id number] DOB: 07-04-1950 DOA: 05/13/2020 PCP: Tanna Furry, PA-C  Brief Narrative: 70 year old female with history of CAD, remote PCI in 2007, COPD, ongoing tobacco abuse, descending aortic aneurysm/AAA status post repair was admitted on 3/3 with chest pain, dyspnea, found to have non-STEMI as well as COPD with mucous plugging   Assessment & Plan:   Non-ST elevation MI Acute systolic CHF -Cath noted nonobstructive coronary disease, suspected to be Takotsubo cardiomyopathy -Cardiology following, clinically appears euvolemic at this time -Continue aspirin, beta-blocker, losartan  Acute hypoxic respiratory failure Secondary to COPD exacerbation, mucous plugging -Improving, no wheezing at this time -Continue prednisone and azithromycin -Duo nebs -Wean O2 as tolerated  Essential hypertension -Stable  Normocytic anemia -Stable, monitor  AAA: Patient with 3.6 cm abdominal aortic aneurysm with prior treated abdominal aortic aneurysm. -Continue yearly surveillance  Tobacco abuse: Patient reports approximately a 55 smoking pack year history. -Declines nicotine patch -Counseled    DVT prophylaxis: Add Lovenox Code Status: Full code Family Communication: Son at bedside Disposition Plan:  Status is: Inpatient  Remains inpatient appropriate because:Inpatient level of care appropriate due to severity of illness   Dispo: The patient is from: Home              Anticipated d/c is to: Home              Patient currently is not medically stable to d/c.   Difficult to place patient No    Consultants:   Cardiology   Procedures: Left heart cath  Antimicrobials:    Subjective: -Feels okay, anxious to go home, feels uncomfortable in the hospital  Objective: Vitals:   05/15/20 0026 05/15/20 0118 05/15/20 0438 05/15/20 0748  BP: 108/73 (!) 95/57 99/63 (!) 111/58  Pulse:    77  Resp: 18 18 15 16   Temp: 97.7 F (36.5 C)  97.7 F  (36.5 C)   TempSrc: Oral  Oral   SpO2:    94%  Weight:   37.8 kg   Height:        Intake/Output Summary (Last 24 hours) at 05/15/2020 1144 Last data filed at 05/15/2020 0400 Gross per 24 hour  Intake 281.48 ml  Output -  Net 281.48 ml   Filed Weights   05/14/20 0918 05/15/20 0438  Weight: 37.2 kg 37.8 kg    Examination:  General exam: Chronically ill thinly built female sitting up in bed, anxious, AAOx3 CVS: S1-S2, regular rate rhythm Lungs: Poor air movement bilaterally Abdomen: Soft, nontender, bowel sounds present Extremities: No edema   Data Reviewed:   CBC: Recent Labs  Lab 05/13/20 0158 05/14/20 0240 05/14/20 1347 05/14/20 1354  WBC 11.1* 8.8  --   --   NEUTROABS 6.9  --   --   --   HGB 12.7 11.8* 12.6 12.2  HCT 40.2 35.2* 37.0 36.0  MCV 98.5 93.9  --   --   PLT 152 158  --   --    Basic Metabolic Panel: Recent Labs  Lab 05/13/20 0158 05/14/20 1347 05/14/20 1354 05/15/20 0352  NA 140 140 143 134*  K 4.6 3.7 3.5 4.2  CL 106  --   --  107  CO2 21*  --   --  19*  GLUCOSE 122*  --   --  139*  BUN 15  --   --  26*  CREATININE 0.87  --   --  0.80  CALCIUM 9.0  --   --  8.7*   GFR: Estimated Creatinine Clearance: 38.5 mL/min (by C-G formula based on SCr of 0.8 mg/dL). Liver Function Tests: No results for input(s): AST, ALT, ALKPHOS, BILITOT, PROT, ALBUMIN in the last 168 hours. No results for input(s): LIPASE, AMYLASE in the last 168 hours. No results for input(s): AMMONIA in the last 168 hours. Coagulation Profile: No results for input(s): INR, PROTIME in the last 168 hours. Cardiac Enzymes: No results for input(s): CKTOTAL, CKMB, CKMBINDEX, TROPONINI in the last 168 hours. BNP (last 3 results) No results for input(s): PROBNP in the last 8760 hours. HbA1C: Recent Labs    05/14/20 0240  HGBA1C 5.8*   CBG: No results for input(s): GLUCAP in the last 168 hours. Lipid Profile: Recent Labs    05/14/20 0240  CHOL 183  HDL 52  LDLCALC 115*   TRIG 82  CHOLHDL 3.5   Thyroid Function Tests: Recent Labs    05/13/20 1328  TSH 1.110   Anemia Panel: No results for input(s): VITAMINB12, FOLATE, FERRITIN, TIBC, IRON, RETICCTPCT in the last 72 hours. Urine analysis:    Component Value Date/Time   COLORURINE STRAW (A) 08/15/2016 1507   APPEARANCEUR CLEAR 08/15/2016 1507   LABSPEC 1.005 08/15/2016 1507   PHURINE 6.0 08/15/2016 1507   GLUCOSEU NEGATIVE 08/15/2016 1507   HGBUR NEGATIVE 08/15/2016 1507   BILIRUBINUR NEGATIVE 08/15/2016 1507   KETONESUR 5 (A) 08/15/2016 1507   PROTEINUR NEGATIVE 08/15/2016 1507   NITRITE NEGATIVE 08/15/2016 1507   LEUKOCYTESUR TRACE (A) 08/15/2016 1507   Sepsis Labs: @LABRCNTIP (procalcitonin:4,lacticidven:4)  ) Recent Results (from the past 240 hour(s))  Resp Panel by RT-PCR (Flu A&B, Covid) Nasopharyngeal Swab     Status: None   Collection Time: 05/13/20  2:07 AM   Specimen: Nasopharyngeal Swab; Nasopharyngeal(NP) swabs in vial transport medium  Result Value Ref Range Status   SARS Coronavirus 2 by RT PCR NEGATIVE NEGATIVE Final    Comment: (NOTE) SARS-CoV-2 target nucleic acids are NOT DETECTED.  The SARS-CoV-2 RNA is generally detectable in upper respiratory specimens during the acute phase of infection. The lowest concentration of SARS-CoV-2 viral copies this assay can detect is 138 copies/mL. A negative result does not preclude SARS-Cov-2 infection and should not be used as the sole basis for treatment or other patient management decisions. A negative result may occur with  improper specimen collection/handling, submission of specimen other than nasopharyngeal swab, presence of viral mutation(s) within the areas targeted by this assay, and inadequate number of viral copies(<138 copies/mL). A negative result must be combined with clinical observations, patient history, and epidemiological information. The expected result is Negative.  Fact Sheet for Patients:   07/13/20  Fact Sheet for Healthcare Providers:  BloggerCourse.com  This test is no t yet approved or cleared by the [place] FDA and  has been authorized for detection and/or diagnosis of SARS-CoV-2 by FDA under an Emergency Use Authorization (EUA). This EUA will remain  in effect (meaning this test can be used) for the duration of the COVID-19 declaration under Section 564(b)(1) of the Act, 21 U.S.C.section 360bbb-3(b)(1), unless the authorization is terminated  or revoked sooner.       Influenza A by PCR NEGATIVE NEGATIVE Final   Influenza B by PCR NEGATIVE NEGATIVE Final    Comment: (NOTE) The Xpert Xpress SARS-CoV-2/FLU/RSV plus assay is intended as an aid in the diagnosis of influenza from Nasopharyngeal swab specimens and should not be used as a sole basis for treatment. Nasal washings and aspirates are unacceptable for Xpert  Xpress SARS-CoV-2/FLU/RSV testing.  Fact Sheet for Patients: BloggerCourse.comhttps://www.fda.gov/media/152166/download  Fact Sheet for Healthcare Providers: SeriousBroker.ithttps://www.fda.gov/media/152162/download  This test is not yet approved or cleared by the [place] FDA and has been authorized for detection and/or diagnosis of SARS-CoV-2 by FDA under an Emergency Use Authorization (EUA). This EUA will remain in effect (meaning this test can be used) for the duration of the COVID-19 declaration under Section 564(b)(1) of the Act, 21 U.S.C. section 360bbb-3(b)(1), unless the authorization is terminated or revoked.  Performed at [facility], [address] [place], [place] [address]          Radiology Studies: CARDIAC CATHETERIZATION  Result Date: 05/14/2020  The left ventricular ejection fraction is 25-35% by visual estimate.  1.  Patent RCA stent with mild in-stent restenosis 2.  Patent left main 3.  Patent LAD with mild to moderate nonobstructive stenosis in the mid vessel and a segment of  intramyocardial bridging 4.  Patent left circumflex with minimal nonobstructive disease 5.  Severe LV dysfunction with contractile pattern consistent with acute Takotsubo syndrome/periapical ballooning, vigorous basal contraction, LVEF less than 35%   ECHOCARDIOGRAM COMPLETE  Result Date: 05/13/2020    ECHOCARDIOGRAM REPORT   Patient Name:   Alexandra Mays Date of Exam: 05/13/2020 Medical Rec #:  [id number]         Height:       63.0 in Accession #:    [id number]        Weight:       80.0 lb Date of Birth:  04/10/1950          BSA:          1.310 m Patient Age:    69 years          BP:           109/66 mmHg Patient Gender: F                 HR:           87 bpm. Exam Location:  Inpatient Procedure: 2D Echo, Cardiac Doppler, Color Doppler and Intracardiac            Opacification Agent Indications:    R07.9* Chest pain, unspecified  History:        Patient has no prior history of Echocardiogram examinations.                 Signs/Symptoms:Chest Pain, Shortness of Breath and Dyspnea.                 Aortic aneurysm repair.  Sonographer:    Sheralyn Boatmanina West RDCS Referring Phys: [id number] [facility]  Sonographer Comments: Technically difficult study due to poor echo windows. Extremely thin habitus, difficult angles. Interrupted multiple times. IMPRESSIONS  1. Left ventricular ejection fraction, by estimation, is 20 to 25%. The left ventricle has severely decreased function.  2. There is severe hypokinesis of all mid-to-apical LV segments. The basal segments have hyperdynamic contractility. 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  3. Left ventricular diastolic parameters are consistent with Grade I diastolic dysfunction (impaired relaxation).  4. Right ventricular systolic function is normal. The right ventricular size is normal.  5. The mitral valve is degenerative. Trivial mitral valve regurgitation.  6. The aortic valve was not well visualized. There is mild calcification of  the aortic valve. There is mild thickening of the aortic valve. Aortic valve regurgitation is trivial.  7. The inferior vena cava is  dilated in size with <50% respiratory variability, suggesting right atrial pressure of 15 mmHg.  8. Slow flow noted in the IVC. FINDINGS  Left Ventricle: Left ventricular ejection fraction, by estimation, is 20 to 25%. The left ventricle has severely decreased function. The left ventricle demonstrates regional wall motion abnormalities. Definity contrast agent was given IV to delineate the left ventricular endocardial borders. There is severe hypokinesis of all mid-to-apical LV segments. The basal segments have hyperdynamic contractility. 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 The left ventricular internal cavity size was normal in size. There is no left ventricular hypertrophy. Left ventricular diastolic parameters are consistent with Grade I diastolic dysfunction (impaired relaxation). Right Ventricle: The right ventricular size is normal. Right vetricular wall thickness was not well visualized. Right ventricular systolic function is normal. Left Atrium: Left atrial size was normal in size. Right Atrium: Right atrial size was normal in size. Pericardium: There is no evidence of pericardial effusion. Mitral Valve: The mitral valve is degenerative in appearance. Trivial mitral valve regurgitation. Tricuspid Valve: The tricuspid valve is normal in structure. Tricuspid valve regurgitation is mild. Aortic Valve: The aortic valve was not well visualized. There is mild calcification of the aortic valve. There is mild thickening of the aortic valve. Aortic valve regurgitation is trivial. Pulmonic Valve: The pulmonic valve was normal in structure. Pulmonic valve regurgitation is trivial. Aorta: Aortic root could not be assessed and the aortic root is normal in size and structure. Venous: The inferior vena cava is dilated in size with less than 50% respiratory  variability, suggesting right atrial pressure of 15 mmHg. IAS/Shunts: No atrial level shunt detected by color flow Doppler.  LEFT VENTRICLE PLAX 2D LVIDd:         3.80 cm     Diastology LVIDs:         3.60 cm     LV e' medial:    4.46 cm/s LV PW:         1.00 cm     LV E/e' medial:  13.4 LV IVS:        1.00 cm     LV e' lateral:   4.57 cm/s LVOT diam:     1.80 cm     LV E/e' lateral: 13.1 LV SV:         25 LV SV Index:   19 LVOT Area:     2.54 cm  LV Volumes (MOD) LV vol d, MOD A4C: 65.2 ml LV vol s, MOD A4C: 57.1 ml LV SV MOD A4C:     65.2 ml RIGHT VENTRICLE             IVC RV S prime:     11.10 cm/s  IVC diam: 1.70 cm TAPSE (M-mode): 1.6 cm LEFT ATRIUM             Index      RIGHT ATRIUM          Index LA diam:        2.10 cm 1.60 cm/m RA Area:     6.93 cm LA Vol (A2C):   10.8 ml 8.24 ml/m RA Volume:   12.20 ml 9.31 ml/m LA Vol (A4C):   11.0 ml 8.40 ml/m LA Biplane Vol: 11.7 ml 8.93 ml/m  AORTIC VALVE LVOT Vmax:   65.00 cm/s LVOT Vmean:  40.800 cm/s LVOT VTI:    0.100 m  AORTA Ao Root diam: 2.80 cm MITRAL VALVE MV Area (PHT): 5.38 cm    SHUNTS MV  Decel Time: 141 msec    Systemic VTI:  0.10 m MV E velocity: 59.70 cm/s  Systemic Diam: 1.80 cm MV A velocity: 77.10 cm/s MV E/A ratio:  0.77 Laurance Flatten MD Electronically signed by Laurance Flatten MD Signature Date/Time: 05/13/2020/2:48:10 PM    Final         Scheduled Meds: . albuterol  2.5 mg Nebulization QID  . aspirin EC  81 mg Oral Daily  . atorvastatin  80 mg Oral Daily  . azithromycin  500 mg Oral Daily  . carvedilol  3.125 mg Oral BID WC  . fluticasone furoate-vilanterol  1 puff Inhalation Daily  . guaiFENesin  600 mg Oral BID  . losartan  12.5 mg Oral Daily  . nicotine  7 mg Transdermal Daily  . predniSONE  40 mg Oral Q breakfast  . sodium chloride flush  3 mL Intravenous Q12H  . umeclidinium bromide  1 puff Inhalation Daily   Continuous Infusions: . sodium chloride       LOS: 2 days    Time spent:    Zannie Cove, MD [facility] 05/15/2020, 11:44 AM

## 2020-05-16 DIAGNOSIS — I214 Non-ST elevation (NSTEMI) myocardial infarction: Secondary | ICD-10-CM | POA: Diagnosis not present

## 2020-05-16 DIAGNOSIS — I5181 Takotsubo syndrome: Secondary | ICD-10-CM | POA: Diagnosis not present

## 2020-05-16 LAB — CBC
HCT: 33.6 % — ABNORMAL LOW (ref 36.0–46.0)
Hemoglobin: 11.4 g/dL — ABNORMAL LOW (ref 12.0–15.0)
MCH: 31.8 pg (ref 26.0–34.0)
MCHC: 33.9 g/dL (ref 30.0–36.0)
MCV: 93.6 fL (ref 80.0–100.0)
Platelets: 143 10*3/uL — ABNORMAL LOW (ref 150–400)
RBC: 3.59 MIL/uL — ABNORMAL LOW (ref 3.87–5.11)
RDW: 12.9 % (ref 11.5–15.5)
WBC: 9.1 10*3/uL (ref 4.0–10.5)
nRBC: 0 % (ref 0.0–0.2)

## 2020-05-16 LAB — BASIC METABOLIC PANEL
Anion gap: 9 (ref 5–15)
BUN: 25 mg/dL — ABNORMAL HIGH (ref 8–23)
CO2: 22 mmol/L (ref 22–32)
Calcium: 8.9 mg/dL (ref 8.9–10.3)
Chloride: 104 mmol/L (ref 98–111)
Creatinine, Ser: 0.82 mg/dL (ref 0.44–1.00)
GFR, Estimated: >60 mL/min (ref >60)
Glucose, Bld: 92 mg/dL (ref 70–99)
Potassium: 3.9 mmol/L (ref 3.5–5.1)
Sodium: 135 mmol/L (ref 135–145)

## 2020-05-16 MED ORDER — CARVEDILOL 3.125 MG PO TABS: 3.1250 mg | ORAL_TABLET | Freq: Two times a day (BID) | ORAL | 0 refills | Status: DC

## 2020-05-16 MED ORDER — LOSARTAN POTASSIUM 25 MG PO TABS: 12.5000 mg | ORAL_TABLET | Freq: Every day | ORAL | 0 refills | Status: DC

## 2020-05-16 MED ORDER — PREDNISONE 20 MG PO TABS: 20.0000 mg | ORAL_TABLET | Freq: Every day | ORAL | 0 refills | Status: DC

## 2020-05-16 NOTE — Plan of Care (Signed)

## 2020-05-16 NOTE — Discharge Instructions (Signed)
Stress-Induced Cardiomyopathy  Stress-induced cardiomyopathy is a disease of the heart muscle that is caused by extreme stress. It is also known as broken heart syndrome, apical ballooning syndrome, or takotsubo cardiomyopathy. Cardiomyopathy makes the heart muscle thick, weak, or stiff. In stress-induced cardiomyopathy, the heart weakens and bulges. When this happens, it cannot pump blood properly. Stress-induced cardiomyopathy can lead to a condition in which the heart has trouble pumping blood (heart failure). While it can be severe, the failure is usually short-term. Most people with this condition make a full recovery. What are the causes? The exact cause of this condition is not known. Symptoms are usually caused by:  Extreme emotional stress brought on by anger, surprise, or grief, such as the death of a loved one or a divorce.  Physical stress, such as from having a severe infection or major surgery. Body chemicals (hormones) released while experiencing stress may affect the heart's ability to pump blood. What increases the risk? You are more likely to develop this condition if:  You are a woman, especially if you have gone through menopause (postmenopausal).  You have no history of heart disease. What are the signs or symptoms? Common symptoms of this condition include:  Chest pain (angina). People often mistake this condition for a heart attack because the most common symptom is sudden chest pain.  Shortness of breath. Other symptoms include:  Irregular or fast heartbeats (arrhythmia).  Fainting.  Low blood pressure.  The heart not being able to pump enough blood (cardiogenic shock).  Heart failure. How is this diagnosed? This condition is diagnosed based on your symptoms, medical history, and a physical exam. You may also have tests, including:  Blood tests. This is done to check for troponin levels in the blood. Troponins are proteins that are released when there is  damage to the heart muscle.  Electrocardiogram (ECG). This test is done to check for problems with electrical activity in the heart.  Echocardiogram. This test involves making an image of your heart using sound waves. It can show how well your heart is pumping blood.  Coronary angiogram. During this test, a thin tube (catheter) is inserted into a blood vessel, and dye is injected into your bloodstream (cardiac catheterization). The dye appears on an X-ray and shows how blood flows through the arteries in your heart.  Ventriculogram. This test is done during a coronary angiogram. A dye is injected into your heart. The dye appears on an X-ray and shows how well the lower left chamber of the heart (left ventricle) can pump blood.  Chest X-ray.  MRI of the heart. How is this treated? This condition may be treated with:  Medicines. These may include: ? Anti-anxiety medicines to manage stress. ? ACE inhibitors to lower blood pressure. ? Diuretics to reduce swelling and prevent fluid buildup in the lungs. ? Beta-blockers to lower blood pressure and heart rate. They also reduce the workload of the heart muscle.  Lifestyle changes, such as managing stress and losing weight. You may also be treated for complications caused by stress-induced cardiomyopathy, such as heart failure, shock, or heart rhythm problems. Follow these instructions at home: Eating and drinking  Eat a healthy diet. This includes plenty of fruits and vegetables, lean meats, whole grains, and fat-free or low-fat dairy products.  Avoid foods that are high in salt (sodium), sugar, saturated fat, and trans fat.   Lifestyle  Manage your response to stress. Try to do this with relaxation exercises, mindfulness, yoga, quiet time, or  meditation.  Think about taking part in a stress management program. Ask your health care provider for recommendations.  Do not use any products that contain nicotine or tobacco, such as cigarettes,  e-cigarettes, and chewing tobacco. If you need help quitting, ask your health care provider.  Maintain a healthy weight. Ask your health care provider what a healthy weight is for you.  Weigh yourself daily. This will alert you and your health care provider if you have sudden fluid buildup in your body. Alcohol use  Do not drink alcohol if: ? Your health care provider tells you not to drink. ? You are pregnant, may be pregnant, or are planning to become pregnant. If you drink alcohol:  Limit how much you use to: ? 0-1 drink a day for women. ? 0-2 drinks a day for men.  Be aware of how much alcohol is in your drink. In the [place] one drink equals one 12 oz bottle of beer (355 mL), one 5 oz glass of wine (148 mL), or one 1 oz glass of hard liquor (44 mL). General instructions  Take over-the-counter and prescription medicines only as told by your health care provider.  Exercise regularly. Aim for 150 minutes of moderate exercise, such as walking, or 75 minutes of vigorous exercise, such as running, each week.  Ask your health care provider what exercises are safe for you. This may include an exercise program called cardiac rehabilitation.  Keep all follow-up visits as told by your health care provider. This is important. Where to find more information  American Heart Association: www.heart.org Contact a health care provider if you:  Have any side effects from your medicines, such as depression or fast or irregular heartbeats (palpitations).  Develop new symptoms, such as: ? Shortness of breath after exercising. ? Increased fatigue. ? Swelling of your ankles, feet, legs, or abdomen. ? Nausea or lack of appetite. ? Rapid heartbeat.  Have sudden weight gain. Get help right away if you:  Have sudden, severe chest pain.  Have shortness of breath that does not go away.  Faint. These symptoms may represent a serious problem that is an emergency. Do not wait to see if the  symptoms will go away. Get medical help right away. Call your local emergency services (911 in the [place] Do not drive yourself to the hospital. Summary  Stress-induced cardiomyopathy is caused by extreme stress brought on by anger, surprise, or grief. It can also be caused by physical stress, such as from having a severe infection or major surgery. This disease is also known as broken heart syndrome.  People often mistake this condition for a heart attack because the most common symptom is chest pain (angina).  Most people with this condition make a full recovery over time.  You are more likely to develop this condition if you are a woman who has gone through menopause. This information is not intended to replace advice given to you by your health care provider. Make sure you discuss any questions you have with your health care provider. Document Revised: 12/05/2018 Document Reviewed: 12/05/2018 Elsevier Patient Education  2021 Elsevier Inc.    Low-Sodium Eating Plan Sodium, which is an element that makes up salt, helps you maintain a healthy balance of fluids in your body. Too much sodium can increase your blood pressure and cause fluid and waste to be held in your body. Your health care provider or dietitian may recommend following this plan if you have high blood pressure (hypertension), kidney  disease, liver disease, or heart failure. Eating less sodium can help lower your blood pressure, reduce swelling, and protect your heart, liver, and kidneys. What are tips for following this plan? Reading food labels  The Nutrition Facts label lists the amount of sodium in one serving of the food. If you eat more than one serving, you must multiply the listed amount of sodium by the number of servings.  Choose foods with less than 140 mg of sodium per serving.  Avoid foods with 300 mg of sodium or more per serving. Shopping  Look for lower-sodium products, often labeled as "low-sodium" or "no  salt added."  Always check the sodium content, even if foods are labeled as "unsalted" or "no salt added."  Buy fresh foods. ? Avoid canned foods and pre-made or frozen meals. ? Avoid canned, cured, or processed meats.  Buy breads that have less than 80 mg of sodium per slice.   Cooking  Eat more home-cooked food and less restaurant, buffet, and fast food.  Avoid adding salt when cooking. Use salt-free seasonings or herbs instead of table salt or sea salt. Check with your health care provider or pharmacist before using salt substitutes.  Cook with plant-based oils, such as canola, sunflower, or olive oil.   Meal planning  When eating at a restaurant, ask that your food be prepared with less salt or no salt, if possible. Avoid dishes labeled as brined, pickled, cured, smoked, or made with soy sauce, miso, or teriyaki sauce.  Avoid foods that contain MSG (monosodium glutamate). MSG is sometimes added to [place] food, bouillon, and some canned foods.  Make meals that can be grilled, baked, poached, roasted, or steamed. These are generally made with less sodium. General information Most people on this plan should limit their sodium intake to 1,500-2,000 mg (milligrams) of sodium each day. What foods should I eat? Fruits Fresh, frozen, or canned fruit. Fruit juice. Vegetables Fresh or frozen vegetables. "No salt added" canned vegetables. "No salt added" tomato sauce and paste. Low-sodium or reduced-sodium tomato and vegetable juice. Grains Low-sodium cereals, including oats, puffed wheat and rice, and shredded wheat. Low-sodium crackers. Unsalted rice. Unsalted pasta. Low-sodium bread. Whole-grain breads and whole-grain pasta. Meats and other proteins Fresh or frozen (no salt added) meat, poultry, seafood, and fish. Low-sodium canned tuna and salmon. Unsalted nuts. Dried peas, beans, and lentils without added salt. Unsalted canned beans. Eggs. Unsalted nut butters. Dairy Milk. Soy milk.  Cheese that is naturally low in sodium, such as ricotta cheese, fresh mozzarella, or Swiss cheese. Low-sodium or reduced-sodium cheese. Cream cheese. Yogurt. Seasonings and condiments Fresh and dried herbs and spices. Salt-free seasonings. Low-sodium mustard and ketchup. Sodium-free salad dressing. Sodium-free light mayonnaise. Fresh or refrigerated horseradish. Lemon juice. Vinegar. Other foods Homemade, reduced-sodium, or low-sodium soups. Unsalted popcorn and pretzels. Low-salt or salt-free chips. The items listed above may not be a complete list of foods and beverages you can eat. Contact a dietitian for more information. What foods should I avoid? Vegetables Sauerkraut, pickled vegetables, and relishes. Olives. [place] fries. Onion rings. Regular canned vegetables (not low-sodium or reduced-sodium). Regular canned tomato sauce and paste (not low-sodium or reduced-sodium). Regular tomato and vegetable juice (not low-sodium or reduced-sodium). Frozen vegetables in sauces. Grains Instant hot cereals. Bread stuffing, pancake, and biscuit mixes. Croutons. Seasoned rice or pasta mixes. Noodle soup cups. Boxed or frozen macaroni and cheese. Regular salted crackers. Self-rising flour. Meats and other proteins Meat or fish that is salted, canned, smoked, spiced, or pickled.  Precooked or cured meat, such as sausages or meat loaves. Tomasa Blase. Ham. Pepperoni. Hot dogs. Corned beef. Chipped beef. Salt pork. Jerky. Pickled herring. Anchovies and sardines. Regular canned tuna. Salted nuts. Dairy Processed cheese and cheese spreads. Hard cheeses. Cheese curds. Blue cheese. Feta cheese. String cheese. Regular cottage cheese. Buttermilk. Canned milk. Fats and oils Salted butter. Regular margarine. Ghee. Bacon fat. Seasonings and condiments Onion salt, garlic salt, seasoned salt, table salt, and sea salt. Canned and packaged gravies. Worcestershire sauce. Tartar sauce. Barbecue sauce. Teriyaki sauce. Soy sauce,  including reduced-sodium. Steak sauce. Fish sauce. Oyster sauce. Cocktail sauce. Horseradish that you find on the shelf. Regular ketchup and mustard. Meat flavorings and tenderizers. Bouillon cubes. Hot sauce. Pre-made or packaged marinades. Pre-made or packaged taco seasonings. Relishes. Regular salad dressings. Salsa. Other foods Salted popcorn and pretzels. Corn chips and puffs. Potato and tortilla chips. Canned or dried soups. Pizza. Frozen entrees and pot pies. The items listed above may not be a complete list of foods and beverages you should avoid. Contact a dietitian for more information. Summary  Eating less sodium can help lower your blood pressure, reduce swelling, and protect your heart, liver, and kidneys.  Most people on this plan should limit their sodium intake to 1,500-2,000 mg (milligrams) of sodium each day.  Canned, boxed, and frozen foods are high in sodium. Restaurant foods, fast foods, and pizza are also very high in sodium. You also get sodium by adding salt to food.  Try to cook at home, eat more fresh fruits and vegetables, and eat less fast food and canned, processed, or prepared foods. This information is not intended to replace advice given to you by your health care provider. Make sure you discuss any questions you have with your health care provider. Document Revised: 04/04/2019 Document Reviewed: 01/29/2019 Elsevier Patient Education  2021 [employer].

## 2020-05-16 NOTE — Progress Notes (Signed)
Progress Note  Patient Name: Alexandra Mays Date of Encounter: 05/16/2020  Primary Cardiologist: Garwin Brothers, MD  Subjective   Reports no chest pain or shortness of breath, has ambulated in her room.  Wants to go home.  Inpatient Medications    Scheduled Meds: . aspirin EC  81 mg Oral Daily  . atorvastatin  80 mg Oral Daily  . azithromycin  500 mg Oral Daily  . carvedilol  3.125 mg Oral BID WC  . enoxaparin (LOVENOX) injection  30 mg Subcutaneous Q24H  . fluticasone furoate-vilanterol  1 puff Inhalation Daily  . guaiFENesin  600 mg Oral BID  . losartan  12.5 mg Oral Daily  . nicotine  14 mg Transdermal Daily  . predniSONE  30 mg Oral Q breakfast  . sodium chloride flush  3 mL Intravenous Q12H  . umeclidinium bromide  1 puff Inhalation Daily   Continuous Infusions: . sodium chloride     PRN Meds: sodium chloride, acetaminophen **OR** acetaminophen, albuterol, ALPRAZolam, alum & mag hydroxide-simeth, nitroGLYCERIN, ondansetron **OR** ondansetron (ZOFRAN) IV, sodium chloride flush   Vital Signs    Vitals:   05/15/20 1953 05/15/20 2040 05/16/20 0435 05/16/20 0918  BP: 96/66  (!) 91/46   Pulse:   70 73  Resp: 18  18 16   Temp: 97.9 F (36.6 C)  97.6 F (36.4 C)   TempSrc: Oral  Oral   SpO2: 99% 99% 100% 92%  Weight:      Height:       No intake or output data in the 24 hours ending 05/16/20 0949 Filed Weights   05/14/20 0918 05/15/20 0438  Weight: 37.2 kg 37.8 kg    Telemetry    Sinus rhythm.  Personally reviewed.  ECG    An ECG dated 05/13/2020 was personally reviewed today and demonstrated:  Sinus rhythm with possible right atrial enlargement and PVC.  Physical Exam   GEN:  Frail-appearing woman, no acute distress.   Neck:  No JVD. Cardiac:  RRR without gallop or rub.  Respiratory: Nonlabored. Clear to auscultation bilaterally. GI: Soft, nontender, bowel sounds present. MS:  No edema; No deformity.  Labs    Chemistry Recent Labs  Lab  05/13/20 0158 05/14/20 1347 05/14/20 1354 05/15/20 0352 05/16/20 0230  NA 140   < > 143 134* 135  K 4.6   < > 3.5 4.2 3.9  CL 106  --   --  107 104  CO2 21*  --   --  19* 22  GLUCOSE 122*  --   --  139* 92  BUN 15  --   --  26* 25*  CREATININE 0.87  --   --  0.80 0.82  CALCIUM 9.0  --   --  8.7* 8.9  GFRNONAA >60  --   --  >60 >60  ANIONGAP 13  --   --  8 9   < > = values in this interval not displayed.     Hematology Recent Labs  Lab 05/13/20 0158 05/14/20 0240 05/14/20 1347 05/14/20 1354 05/16/20 0230  WBC 11.1* 8.8  --   --  9.1  RBC 4.08 3.75*  --   --  3.59*  HGB 12.7 11.8* 12.6 12.2 11.4*  HCT 40.2 35.2* 37.0 36.0 33.6*  MCV 98.5 93.9  --   --  93.6  MCH 31.1 31.5  --   --  31.8  MCHC 31.6 33.5  --   --  33.9  RDW 13.1  13.2  --   --  12.9  PLT 152 158  --   --  143*    Cardiac Enzymes Recent Labs  Lab 05/13/20 0158 05/13/20 0358 05/13/20 1103 05/13/20 1328  TROPONINIHS 75* 422* 675* 956*    BNP Recent Labs  Lab 05/13/20 0158  BNP 81.4     Radiology    CARDIAC CATHETERIZATION  Result Date: 05/14/2020  The left ventricular ejection fraction is 25-35% by visual estimate.  1.  Patent RCA stent with mild in-stent restenosis 2.  Patent left main 3.  Patent LAD with mild to moderate nonobstructive stenosis in the mid vessel and a segment of intramyocardial bridging 4.  Patent left circumflex with minimal nonobstructive disease 5.  Severe LV dysfunction with contractile pattern consistent with acute Takotsubo syndrome/periapical ballooning, vigorous basal contraction, LVEF less than 35%    Patient Profile     71 y.o. female with remote history of PCI in 2007, COPD, tobacco abuse, descending aortic aneurysm, AAA status post repair who was admitted on 05/13/2020 with acute shortness of breath and chest pain.  Found to have non-STEMI.  Also with significant cachexia and COPD.  Assessment & Plan    1.  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   LVEF 20 to 25% range.  Cardiac catheterization demonstrated patent RCA stent site with otherwise mild to moderate nonobstructive disease and some intramyocardial bridging of the LAD.  She is chest pain-free.  High-sensitivity troponin I level up to 956.  Currently on aspirin, Lipitor, Coreg, and losartan.  Systolic blood pressure ranging 90s to low 100s with heart rate in the 70s to 80s and no significant arrhythmia by telemetry.  He has ambulated and would very much like to go home.  2.  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  Asymptomatic at this time.  3.  COPD.  Discussed with hospitalist team who plan on discharge today.  Would arrange follow-up with Dr. Kem Parkinson team in [place] in the next 7 days.  No further up titration of medical therapy given present vital signs.  Plan to discharge on current doses of aspirin, Coreg, losartan, and Lipitor.  No standing diuretic for now, next addition would be Aldactone as an outpatient.  Signed, Nona Dell, MD  05/16/2020, 9:49 AM

## 2020-05-17 ENCOUNTER — Encounter (HOSPITAL_COMMUNITY): Payer: Self-pay | Admitting: Cardiovascular Disease

## 2020-05-20 ENCOUNTER — Inpatient Hospital Stay (HOSPITAL_COMMUNITY): Payer: Medicare Other | Admitting: Certified Registered Nurse Anesthetist

## 2020-05-20 ENCOUNTER — Encounter (HOSPITAL_COMMUNITY): Payer: Self-pay | Admitting: Certified Registered Nurse Anesthetist

## 2020-05-20 ENCOUNTER — Encounter (HOSPITAL_COMMUNITY): Payer: Self-pay | Admitting: Neurosurgery

## 2020-05-20 ENCOUNTER — Encounter (HOSPITAL_COMMUNITY)
Admission: EM | Disposition: A | Discharge status: Hospice | Payer: Self-pay | Source: Home / Self Care | Attending: Neurosurgery

## 2020-05-20 ENCOUNTER — Inpatient Hospital Stay (HOSPITAL_COMMUNITY): Payer: Medicare Other

## 2020-05-20 ENCOUNTER — Inpatient Hospital Stay (HOSPITAL_COMMUNITY)
Admission: EM | Admit: 2020-05-20 | Discharge: 2020-06-04 | DRG: 020 | Disposition: A | Discharge status: Hospice | Payer: Medicare Other | Attending: Internal Medicine | Admitting: Internal Medicine

## 2020-05-20 ENCOUNTER — Emergency Department (HOSPITAL_COMMUNITY): Payer: Medicare Other

## 2020-05-20 DIAGNOSIS — E43 Unspecified severe protein-calorie malnutrition: Secondary | ICD-10-CM | POA: Diagnosis present

## 2020-05-20 DIAGNOSIS — Z823 Family history of stroke: Secondary | ICD-10-CM

## 2020-05-20 DIAGNOSIS — I608 Other nontraumatic subarachnoid hemorrhage: Secondary | ICD-10-CM | POA: Diagnosis present

## 2020-05-20 DIAGNOSIS — E222 Syndrome of inappropriate secretion of antidiuretic hormone: Secondary | ICD-10-CM | POA: Diagnosis not present

## 2020-05-20 DIAGNOSIS — Z681 Body mass index (BMI) 19 or less, adult: Secondary | ICD-10-CM

## 2020-05-20 DIAGNOSIS — I6030 Nontraumatic subarachnoid hemorrhage from unspecified posterior communicating artery: Secondary | ICD-10-CM | POA: Diagnosis not present

## 2020-05-20 DIAGNOSIS — Z781 Physical restraint status: Secondary | ICD-10-CM

## 2020-05-20 DIAGNOSIS — R197 Diarrhea, unspecified: Secondary | ICD-10-CM | POA: Diagnosis not present

## 2020-05-20 DIAGNOSIS — H5710 Ocular pain, unspecified eye: Secondary | ICD-10-CM | POA: Diagnosis not present

## 2020-05-20 DIAGNOSIS — G914 Hydrocephalus in diseases classified elsewhere: Secondary | ICD-10-CM | POA: Diagnosis not present

## 2020-05-20 DIAGNOSIS — I10 Essential (primary) hypertension: Secondary | ICD-10-CM | POA: Diagnosis not present

## 2020-05-20 DIAGNOSIS — Z79899 Other long term (current) drug therapy: Secondary | ICD-10-CM

## 2020-05-20 DIAGNOSIS — I6031 Nontraumatic subarachnoid hemorrhage from right posterior communicating artery: Principal | ICD-10-CM | POA: Diagnosis present

## 2020-05-20 DIAGNOSIS — R109 Unspecified abdominal pain: Secondary | ICD-10-CM | POA: Diagnosis not present

## 2020-05-20 DIAGNOSIS — G936 Cerebral edema: Secondary | ICD-10-CM | POA: Diagnosis not present

## 2020-05-20 DIAGNOSIS — J449 Chronic obstructive pulmonary disease, unspecified: Secondary | ICD-10-CM | POA: Diagnosis present

## 2020-05-20 DIAGNOSIS — J9601 Acute respiratory failure with hypoxia: Secondary | ICD-10-CM | POA: Diagnosis not present

## 2020-05-20 DIAGNOSIS — R0602 Shortness of breath: Secondary | ICD-10-CM

## 2020-05-20 DIAGNOSIS — J441 Chronic obstructive pulmonary disease with (acute) exacerbation: Secondary | ICD-10-CM | POA: Diagnosis not present

## 2020-05-20 DIAGNOSIS — G3184 Mild cognitive impairment, so stated: Secondary | ICD-10-CM | POA: Diagnosis present

## 2020-05-20 DIAGNOSIS — F05 Delirium due to known physiological condition: Secondary | ICD-10-CM | POA: Diagnosis not present

## 2020-05-20 DIAGNOSIS — I672 Cerebral atherosclerosis: Secondary | ICD-10-CM | POA: Diagnosis not present

## 2020-05-20 DIAGNOSIS — Z8679 Personal history of other diseases of the circulatory system: Secondary | ICD-10-CM

## 2020-05-20 DIAGNOSIS — I609 Nontraumatic subarachnoid hemorrhage, unspecified: Secondary | ICD-10-CM | POA: Diagnosis not present

## 2020-05-20 DIAGNOSIS — R451 Restlessness and agitation: Secondary | ICD-10-CM | POA: Diagnosis not present

## 2020-05-20 DIAGNOSIS — Z87440 Personal history of urinary (tract) infections: Secondary | ICD-10-CM | POA: Diagnosis not present

## 2020-05-20 DIAGNOSIS — Z66 Do not resuscitate: Secondary | ICD-10-CM | POA: Diagnosis not present

## 2020-05-20 DIAGNOSIS — R64 Cachexia: Secondary | ICD-10-CM | POA: Diagnosis present

## 2020-05-20 DIAGNOSIS — I255 Ischemic cardiomyopathy: Secondary | ICD-10-CM | POA: Diagnosis present

## 2020-05-20 DIAGNOSIS — F17210 Nicotine dependence, cigarettes, uncomplicated: Secondary | ICD-10-CM | POA: Diagnosis present

## 2020-05-20 DIAGNOSIS — F0390 Unspecified dementia without behavioral disturbance: Secondary | ICD-10-CM | POA: Diagnosis present

## 2020-05-20 DIAGNOSIS — I6603 Occlusion and stenosis of bilateral middle cerebral arteries: Secondary | ICD-10-CM | POA: Diagnosis not present

## 2020-05-20 DIAGNOSIS — G4700 Insomnia, unspecified: Secondary | ICD-10-CM | POA: Diagnosis not present

## 2020-05-20 DIAGNOSIS — R1310 Dysphagia, unspecified: Secondary | ICD-10-CM | POA: Diagnosis present

## 2020-05-20 DIAGNOSIS — Z20822 Contact with and (suspected) exposure to covid-19: Secondary | ICD-10-CM | POA: Diagnosis present

## 2020-05-20 DIAGNOSIS — I615 Nontraumatic intracerebral hemorrhage, intraventricular: Secondary | ICD-10-CM | POA: Diagnosis not present

## 2020-05-20 DIAGNOSIS — R6881 Early satiety: Secondary | ICD-10-CM | POA: Diagnosis present

## 2020-05-20 DIAGNOSIS — I671 Cerebral aneurysm, nonruptured: Secondary | ICD-10-CM | POA: Diagnosis not present

## 2020-05-20 DIAGNOSIS — I110 Hypertensive heart disease with heart failure: Secondary | ICD-10-CM | POA: Diagnosis present

## 2020-05-20 DIAGNOSIS — R0902 Hypoxemia: Secondary | ICD-10-CM | POA: Diagnosis not present

## 2020-05-20 DIAGNOSIS — S066X0A Traumatic subarachnoid hemorrhage without loss of consciousness, initial encounter: Secondary | ICD-10-CM | POA: Diagnosis not present

## 2020-05-20 DIAGNOSIS — E440 Moderate protein-calorie malnutrition: Secondary | ICD-10-CM | POA: Diagnosis not present

## 2020-05-20 DIAGNOSIS — G939 Disorder of brain, unspecified: Secondary | ICD-10-CM | POA: Diagnosis not present

## 2020-05-20 DIAGNOSIS — Z4682 Encounter for fitting and adjustment of non-vascular catheter: Secondary | ICD-10-CM | POA: Diagnosis not present

## 2020-05-20 DIAGNOSIS — I69291 Dysphagia following other nontraumatic intracranial hemorrhage: Secondary | ICD-10-CM | POA: Diagnosis not present

## 2020-05-20 DIAGNOSIS — R001 Bradycardia, unspecified: Secondary | ICD-10-CM | POA: Diagnosis not present

## 2020-05-20 DIAGNOSIS — G9341 Metabolic encephalopathy: Secondary | ICD-10-CM

## 2020-05-20 DIAGNOSIS — I67848 Other cerebrovascular vasospasm and vasoconstriction: Secondary | ICD-10-CM | POA: Diagnosis not present

## 2020-05-20 DIAGNOSIS — E785 Hyperlipidemia, unspecified: Secondary | ICD-10-CM | POA: Diagnosis present

## 2020-05-20 DIAGNOSIS — H538 Other visual disturbances: Secondary | ICD-10-CM | POA: Diagnosis present

## 2020-05-20 DIAGNOSIS — M542 Cervicalgia: Secondary | ICD-10-CM | POA: Diagnosis not present

## 2020-05-20 DIAGNOSIS — Z87891 Personal history of nicotine dependence: Secondary | ICD-10-CM | POA: Diagnosis not present

## 2020-05-20 DIAGNOSIS — N390 Urinary tract infection, site not specified: Secondary | ICD-10-CM | POA: Diagnosis not present

## 2020-05-20 DIAGNOSIS — I214 Non-ST elevation (NSTEMI) myocardial infarction: Secondary | ICD-10-CM | POA: Diagnosis present

## 2020-05-20 DIAGNOSIS — E876 Hypokalemia: Secondary | ICD-10-CM | POA: Diagnosis not present

## 2020-05-20 DIAGNOSIS — I2510 Atherosclerotic heart disease of native coronary artery without angina pectoris: Secondary | ICD-10-CM | POA: Diagnosis present

## 2020-05-20 DIAGNOSIS — G919 Hydrocephalus, unspecified: Secondary | ICD-10-CM | POA: Diagnosis not present

## 2020-05-20 DIAGNOSIS — M2550 Pain in unspecified joint: Secondary | ICD-10-CM | POA: Diagnosis not present

## 2020-05-20 DIAGNOSIS — Z7982 Long term (current) use of aspirin: Secondary | ICD-10-CM

## 2020-05-20 DIAGNOSIS — B957 Other staphylococcus as the cause of diseases classified elsewhere: Secondary | ICD-10-CM | POA: Diagnosis not present

## 2020-05-20 DIAGNOSIS — I252 Old myocardial infarction: Secondary | ICD-10-CM | POA: Diagnosis not present

## 2020-05-20 DIAGNOSIS — I6389 Other cerebral infarction: Secondary | ICD-10-CM | POA: Diagnosis present

## 2020-05-20 DIAGNOSIS — G4489 Other headache syndrome: Secondary | ICD-10-CM | POA: Diagnosis not present

## 2020-05-20 DIAGNOSIS — I714 Abdominal aortic aneurysm, without rupture: Secondary | ICD-10-CM | POA: Diagnosis present

## 2020-05-20 DIAGNOSIS — J90 Pleural effusion, not elsewhere classified: Secondary | ICD-10-CM | POA: Diagnosis not present

## 2020-05-20 DIAGNOSIS — R5381 Other malaise: Secondary | ICD-10-CM | POA: Diagnosis not present

## 2020-05-20 DIAGNOSIS — I5022 Chronic systolic (congestive) heart failure: Secondary | ICD-10-CM | POA: Diagnosis present

## 2020-05-20 DIAGNOSIS — Z4659 Encounter for fitting and adjustment of other gastrointestinal appliance and device: Secondary | ICD-10-CM | POA: Diagnosis not present

## 2020-05-20 DIAGNOSIS — Z7401 Bed confinement status: Secondary | ICD-10-CM | POA: Diagnosis not present

## 2020-05-20 DIAGNOSIS — Z7951 Long term (current) use of inhaled steroids: Secondary | ICD-10-CM

## 2020-05-20 DIAGNOSIS — R4182 Altered mental status, unspecified: Secondary | ICD-10-CM

## 2020-05-20 DIAGNOSIS — E781 Pure hyperglyceridemia: Secondary | ICD-10-CM | POA: Diagnosis present

## 2020-05-20 DIAGNOSIS — B952 Enterococcus as the cause of diseases classified elsewhere: Secondary | ICD-10-CM | POA: Diagnosis not present

## 2020-05-20 DIAGNOSIS — K5900 Constipation, unspecified: Secondary | ICD-10-CM | POA: Diagnosis not present

## 2020-05-20 DIAGNOSIS — I629 Nontraumatic intracranial hemorrhage, unspecified: Secondary | ICD-10-CM | POA: Diagnosis not present

## 2020-05-20 DIAGNOSIS — R519 Headache, unspecified: Secondary | ICD-10-CM | POA: Diagnosis not present

## 2020-05-20 DIAGNOSIS — Z955 Presence of coronary angioplasty implant and graft: Secondary | ICD-10-CM

## 2020-05-20 DIAGNOSIS — R29703 NIHSS score 3: Secondary | ICD-10-CM | POA: Diagnosis present

## 2020-05-20 DIAGNOSIS — E871 Hypo-osmolality and hyponatremia: Secondary | ICD-10-CM | POA: Diagnosis not present

## 2020-05-20 HISTORY — PX: RADIOLOGY WITH ANESTHESIA: SHX6223

## 2020-05-20 HISTORY — PX: IR CT HEAD LTD: IMG2386

## 2020-05-20 HISTORY — PX: IR TRANSCATH/EMBOLIZ: IMG695

## 2020-05-20 HISTORY — PX: IR ANGIO VERTEBRAL SEL VERTEBRAL UNI R MOD SED: IMG5368

## 2020-05-20 HISTORY — DX: Other nontraumatic subarachnoid hemorrhage: I60.8

## 2020-05-20 HISTORY — PX: IR ANGIO INTRA EXTRACRAN SEL INTERNAL CAROTID BILAT MOD SED: IMG5363

## 2020-05-20 HISTORY — PX: IR ANGIOGRAM FOLLOW UP STUDY: IMG697

## 2020-05-20 LAB — COMPREHENSIVE METABOLIC PANEL
ALT: 17 U/L (ref 0–44)
AST: 26 U/L (ref 15–41)
Albumin: 3 g/dL — ABNORMAL LOW (ref 3.5–5.0)
Alkaline Phosphatase: 57 U/L (ref 38–126)
Anion gap: 5 (ref 5–15)
BUN: 17 mg/dL (ref 8–23)
CO2: 25 mmol/L (ref 22–32)
Calcium: 8.4 mg/dL — ABNORMAL LOW (ref 8.9–10.3)
Chloride: 102 mmol/L (ref 98–111)
Creatinine, Ser: 0.83 mg/dL (ref 0.44–1.00)
GFR, Estimated: >60 mL/min (ref >60)
Glucose, Bld: 93 mg/dL (ref 70–99)
Potassium: 3.7 mmol/L (ref 3.5–5.1)
Sodium: 132 mmol/L — ABNORMAL LOW (ref 135–145)
Total Bilirubin: 0.5 mg/dL (ref 0.3–1.2)
Total Protein: 5.5 g/dL — ABNORMAL LOW (ref 6.5–8.1)

## 2020-05-20 LAB — I-STAT CHEM 8, ED
BUN: 21 mg/dL (ref 8–23)
Calcium, Ion: 1.07 mmol/L — ABNORMAL LOW (ref 1.15–1.40)
Chloride: 100 mmol/L (ref 98–111)
Creatinine, Ser: 0.7 mg/dL (ref 0.44–1.00)
Glucose, Bld: 85 mg/dL (ref 70–99)
HCT: 35 % — ABNORMAL LOW (ref 36.0–46.0)
Hemoglobin: 11.9 g/dL — ABNORMAL LOW (ref 12.0–15.0)
Potassium: 3.7 mmol/L (ref 3.5–5.1)
Sodium: 135 mmol/L (ref 135–145)
TCO2: 26 mmol/L (ref 22–32)

## 2020-05-20 LAB — CBC WITH DIFFERENTIAL/PLATELET
Abs Immature Granulocytes: 0.03 10*3/uL (ref 0.00–0.07)
Basophils Absolute: 0 10*3/uL (ref 0.0–0.1)
Basophils Relative: 0 %
Eosinophils Absolute: 0.1 10*3/uL (ref 0.0–0.5)
Eosinophils Relative: 1 %
HCT: 35.8 % — ABNORMAL LOW (ref 36.0–46.0)
Hemoglobin: 12 g/dL (ref 12.0–15.0)
Immature Granulocytes: 0 %
Lymphocytes Relative: 16 %
Lymphs Abs: 1.5 10*3/uL (ref 0.7–4.0)
MCH: 31.8 pg (ref 26.0–34.0)
MCHC: 33.5 g/dL (ref 30.0–36.0)
MCV: 95 fL (ref 80.0–100.0)
Monocytes Absolute: 0.4 10*3/uL (ref 0.1–1.0)
Monocytes Relative: 4 %
Neutro Abs: 7.4 10*3/uL (ref 1.7–7.7)
Neutrophils Relative %: 79 %
Platelets: 151 10*3/uL (ref 150–400)
RBC: 3.77 MIL/uL — ABNORMAL LOW (ref 3.87–5.11)
RDW: 13 % (ref 11.5–15.5)
WBC: 9.4 10*3/uL (ref 4.0–10.5)
nRBC: 0 % (ref 0.0–0.2)

## 2020-05-20 LAB — APTT
aPTT: 30 seconds (ref 24–36)
aPTT: 32 seconds (ref 24–36)

## 2020-05-20 LAB — PROTIME-INR
INR: 1.2 (ref 0.8–1.2)
INR: 1.2 (ref 0.8–1.2)
Prothrombin Time: 14.6 seconds (ref 11.4–15.2)
Prothrombin Time: 14.8 seconds (ref 11.4–15.2)

## 2020-05-20 LAB — RESP PANEL BY RT-PCR (FLU A&B, COVID) ARPGX2
Influenza A by PCR: NEGATIVE
Influenza B by PCR: NEGATIVE
SARS Coronavirus 2 by RT PCR: NEGATIVE

## 2020-05-20 LAB — TYPE AND SCREEN
ABO/RH(D): A NEG
Antibody Screen: NEGATIVE

## 2020-05-20 LAB — RAPID URINE DRUG SCREEN, HOSP PERFORMED
Amphetamines: NOT DETECTED
Barbiturates: NOT DETECTED
Benzodiazepines: NOT DETECTED
Cocaine: NOT DETECTED
Opiates: POSITIVE — AB
Tetrahydrocannabinol: NOT DETECTED

## 2020-05-20 LAB — MRSA PCR SCREENING: MRSA by PCR: NEGATIVE

## 2020-05-20 IMAGING — XA IR TRANSCATH EMBOLIZATION
10 of 13 series · 10 of 24 positions shown · IV contrast (IODINE)
Comparison: none

PROCEDURE:
DIAGNOSTIC CEREBRAL ANGIOGRAM

COIL EMBOLIZATION OF RIGHT POSTERIOR COMMUNICATING ARTERY ANEURYSM
HISTORY: The patient is a 69-year-old woman presenting to the hospital with
sudden onset of severe headache and neck pain. CT scan demonstrated
diffuse basal subarachnoid hemorrhage with CT angiogram
demonstrating the presence of a right posterior communicating artery
aneurysm. Patient therefore presents for further workup with
diagnostic cerebral angiogram and possible aneurysm coiling.
TECHNIQUE: CATHETERS AND WIRES
5-French JB-1 glidecatheter

[Series 1: cerebral care 2 · 2 acquisitions, 1 frame shown (1 of 6)]
[im 1/2]
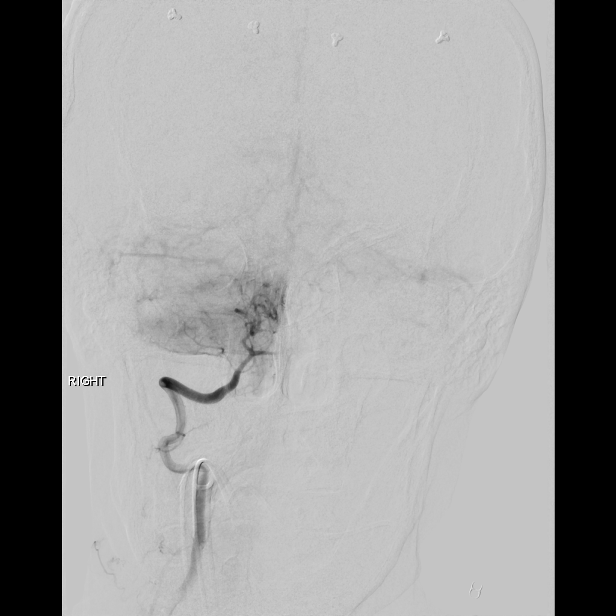

[Series 2: cerebral care 2 · 2 acquisitions, 1 frame shown (2 of 6)]
[im 1/2]
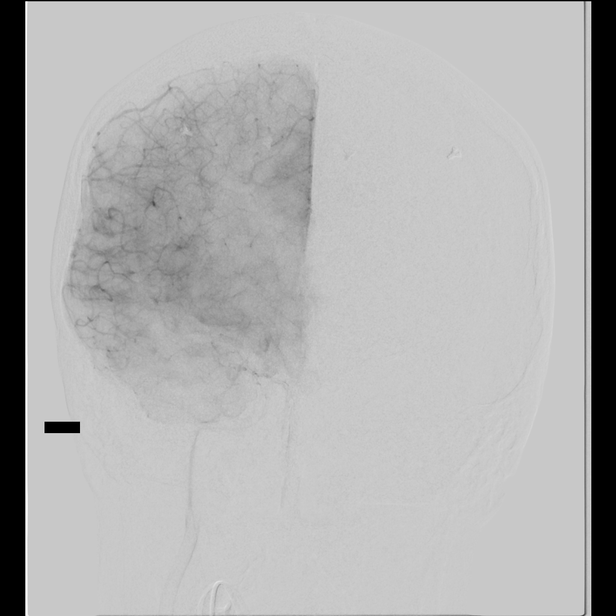

[Series 4: cerebral care 2 · 2 acquisitions, 1 frame shown (3 of 6)]
[im 1/2]
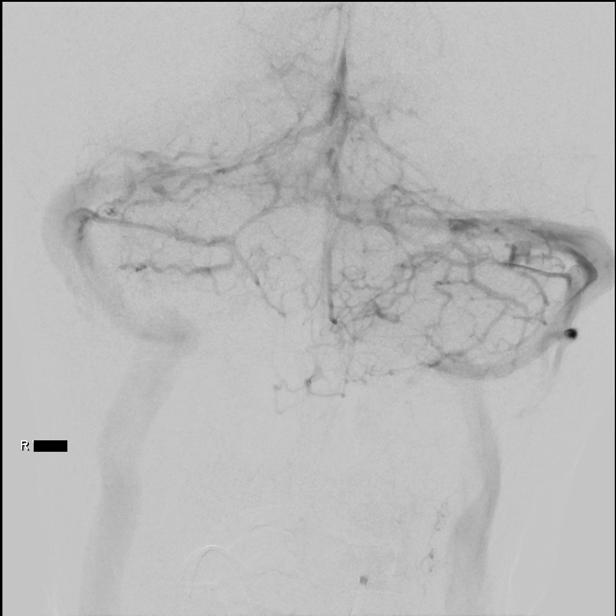

[Series 5: cerebral care 2 · 2 acquisitions, 1 frame shown (4 of 6)]
[im 1/2]
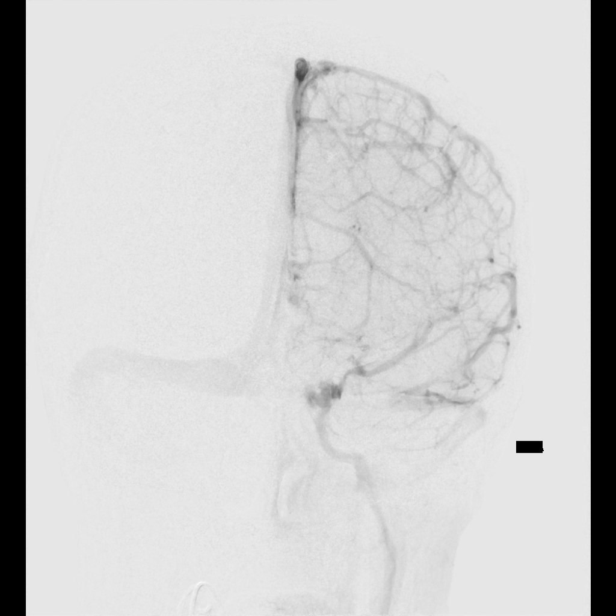

[Series 7: cerebral care 2 · 2 acquisitions, 1 frame shown (5 of 6)]
[im 1/2]
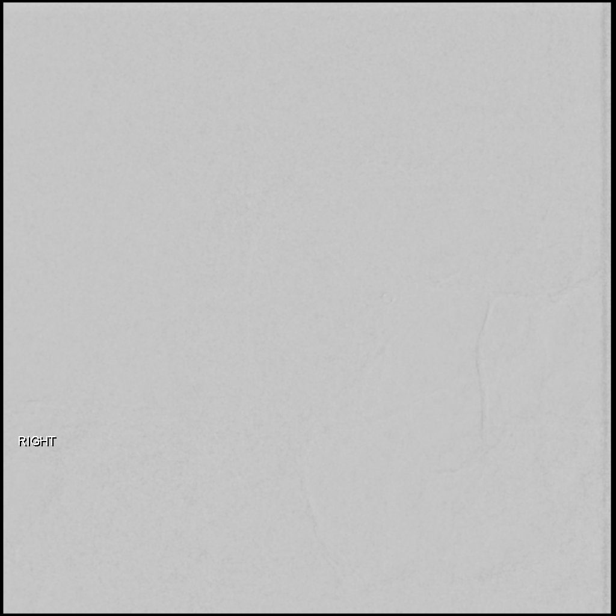

[Series 8: cerebral care 2 · 2 acquisitions, 1 frame shown (6 of 6)]
[im 1/2]
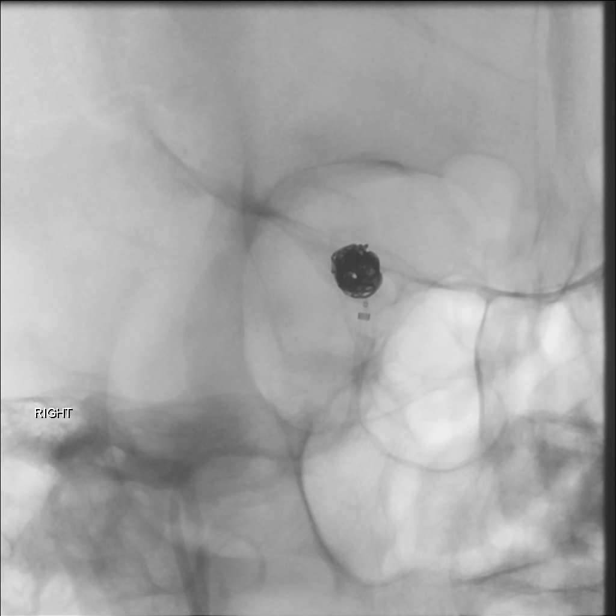

[Series 10: (id) head · 1 of 248 frames shown]
[frame 1/248]
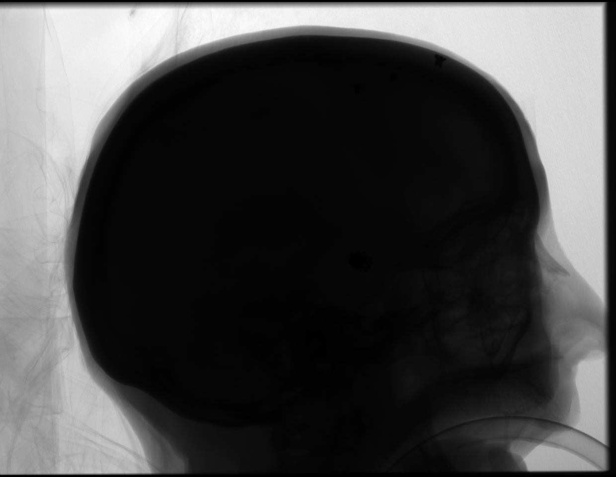

[Series 10: coronal · axial · 2.0mm · 0.30mm/px · 1 of 37 slices shown]
[im 15/37]
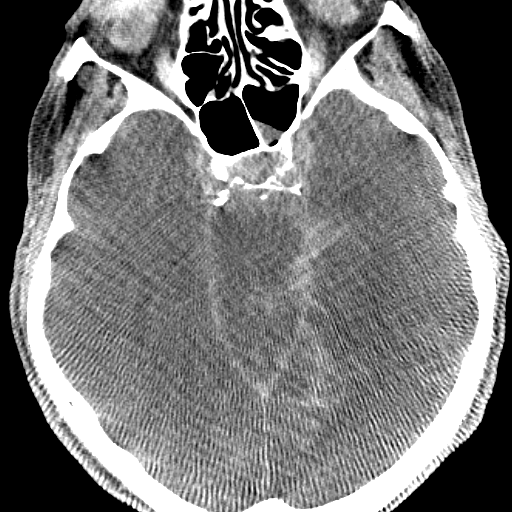

[Series 10: sagital · axial · 2.0mm · 0.44mm/px · 1 of 37 slices shown]
[im 10/37]
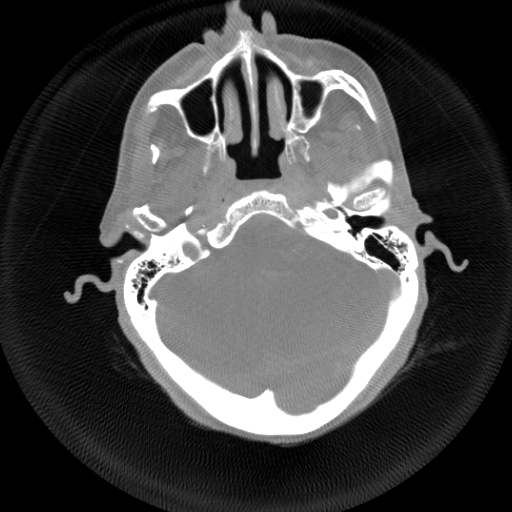

[Series 300: dr. (person_name) · 1 of 22 slices shown]
[im 1/22]
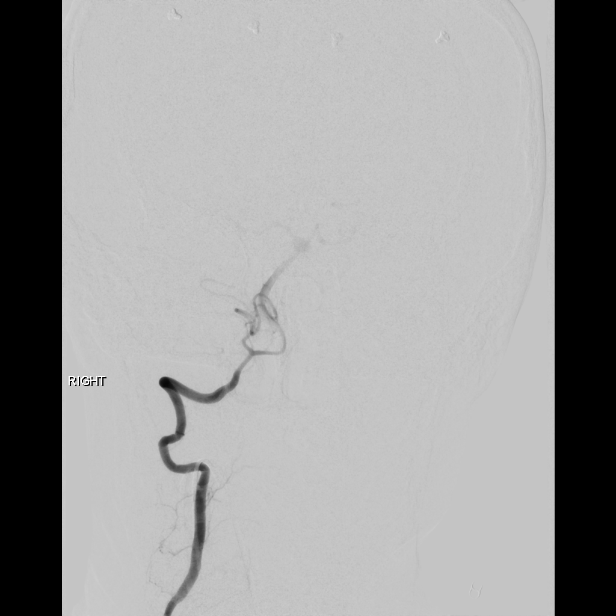

[10 of 24 positions shown; findings below may reference images not displayed]

ACCESS:
The technical aspects of the procedure as well as its potential
risks and benefits were reviewed with the patient's family. These
risks included but were not limited to stroke, intracranial
hemorrhage, bleeding, infection, allergic reaction, damage to organs
or vital structures, stroke, non-diagnostic procedure, and the
catastrophic outcomes of heart attack, coma, and death. With an
understanding of these risks, informed consent was obtained and
witnessed. The patient was placed in the supine position on the
angiography table and the skin of right groin prepped in the usual
sterile fashion. The procedure was performed under general
anesthesia. A 5-French sheath was introduced in the right common
femoral artery using Seldinger technique.

MEDICATIONS:
HEPARIN: 0 Units total.

CONTRAST:  20mL OMNIPAQUE IOHEXOL 300 MG/ML SOLN, 25mL OMNIPAQUE
IOHEXOL 300 MG/ML SOLNcc, Omnipaque 300

FLUOROSCOPY TIME:  FLUOROSCOPY TIME: See IR records
5-French [TD] glidecatheter

180 cm 0.035" glidewire

6-French NeuronMax guide sheath

6-BRIDGEMAN Select JB-1 catheter

0.058" CatV guidecatheter

Synchro 2 standard microwire

Excelsior [TD] microcatheter

Excelsior [TD] microcatheter

Synchro select standard microwire

COILS USED
Target 360 XL 8mm x 30cm

Target 360 XL 7mm x 20cm

VESSELS CATHETERIZED
Right internal carotid

Left common carotid

Left vertebral

Right vertebral

Right common femoral

VESSELS STUDIED
Right internal carotid, head

Left common carotid, head

Left vertebral

Right vertebral

Right internal carotid artery, head (during embolization)

Right internal carotid artery, head (immediate post-embolization)

Right internal carotid artery, head (final control)

Right common femoral

PROCEDURAL NARRATIVE
A 5-Fr JB-1 glide catheter was advanced over a 0.035 glidewire into
the aortic arch. The right vertebral, right internal carotid, and
left vertebral arteries vessels were then sequentially catheterized
and cervical / cerebral angiograms taken. After review of images,
the catheter was removed without incident. The BRIDGEMAN 2 catheter
was introduced and reformed over the aortic arch. The left common
carotid artery was then catheterized and cerebral angiogram taken.
The BRIDGEMAN catheter was then removed. The 5-Fr sheath was then
exchanged over the glidewire for a 25cm 8-Fr sheath.

Under real-time fluoroscopy, the guide sheath was advanced over the
select catheter and glidewire into the descending aorta. The distal
cervical right internal carotid artery was then selected with the
wire and the select catheter was then advanced into the proximal
cervical right internal carotid artery. The guide sheath was then
advanced into the proximal cervical internal carotid artery. The
Select catheter was removed and the 058 guide catheter was coaxially
introduced over the microcatheter and microwire.

The 27 microcatheter was then advanced under roadmap guidance into
the middle cerebral artery. The guide catheter was then tracked over
the microcatheter to its final position in the distal cavernous
segment of the internal carotid. The 27 microcatheter was then
removed and the coiling 17 microcatheter introduced over the
microwire. The catheter was then navigated into the aneurysm lumen
over the microwire. The wire was then removed. The above coils were
then deployed with progressive exclusion of the aneurysm. During
deployment of the second coil, the last few cm of the coil caused
prolapse of the microcatheter into the internal carotid artery. This
required multiple attempts to track the microcatheter back over the
coil into the aneurysm lumen. Eventually we were able to deployed
the remainder of the coil within the aneurysm. Once this aneurysm
coil was deployed due to the difficulty to keep the microcatheter
within the aneurysm lumen I elected not to attempt to place further
coils.

Cerebral angiography was performed immediately post embolization.
The coiling catheter was then removed and the guide catheter
withdrawn into the cervical internal carotid artery. Final control
angiography was then performed from the guide catheter.

The guide catheter and guide sheath were then synchronously removed
without incident.
FINDINGS: Right internal carotid, head:

Injection reveals the presence of a widely patent ICA, M1, and A1
segments and their branches. There is a posteriorly projecting
aneurysm of the distal supraclinoid internal carotid artery. This
measures approximately 9.0 x 7.6 x 7.2 mm. Neck of the aneurysm
appears relatively narrow. The parenchymal and venous phases are
normal. The venous sinuses are widely patent.

Left common carotid, head:

Injection reveals the presence of a widely patent ICA, A1, and M1
segments and their branches. No aneurysms, AVMs, or high-flow
fistulas are seen. The parenchymal and venous phases are normal. The
venous sinuses are widely patent.

Left vertebral:

Injection reveals the presence of a widely patent vertebral artery.
This leads to a widely patent basilar artery that terminates in
bilateral P1. Basilar apex is relatively patulous, however no
aneurysm is seen. There are no AVMs, or high-flow fistulas seen. The
parenchymal and venous phases are normal. The venous sinuses are
widely patent.

Right vertebral:

The vertebral artery is widely patent and essentially terminates as
a right PICA. No PICA aneurysm is seen. See basilar description
above.

Right internal carotid artery, head (during embolization):

Injection reveals the presence of a widely patent ICA that leads to
a patent ACA and MCA. Coil mass within the aneurysm is stable,
without coil prolapse or filling defect to suggest thrombus.

Right internal carotid artery, head (immediate post-embolization):

Injection reveals the presence of a widely patent ICA that leads to
a patent ACA and MCA. Coil mass within the aneurysm is stable,
without coil prolapse or filling defect to suggest thrombus. There
is some aneurysm filling primarily at the dome of the aneurysm
however this is in a delayed fashion. There is significant contrast
stasis within this portion of the aneurysm.

Right internal carotid artery, head (final control):

Injection reveals the presence of a widely patent ICA that leads to
a patent ACA and MCA. No thrombus is visualized. Coil mass is seen
within the aneurysm and is in stable position. Again seen is some
filling of the aneurysm with significant contrast stasis. There is
some contrast seen during the venous phase adjacent to the aneurysm
dome which appears to be venous in nature and does not have the
appearance of contrast extravasation. Capillary phase does not
demonstrate any perfusion deficits. Venous sinuses remain patent.

Right femoral:

Normal vessel. No significant atherosclerotic disease. Arterial
sheath in adequate position.

Dyna CT head:

Dyna CT performed in the angiography suite was reconstructed on an
independent workstation for review. Images again reveal diffuse
basal subarachnoid hemorrhage. In comparison to the preoperative CT
scan there does appear to be new hemorrhage within the fourth
ventricle and third ventricle. There does not appear to be any
significant increase in the amount of subarachnoid hemorrhage. No
significant hydrocephalus.

DISPOSITION:
Upon completion of the study, the femoral sheath was removed and
hemostasis obtained by manual compression. Good proximal and distal
lower extremity pulses were documented upon achievement of
hemostasis. The procedure was well tolerated and no early
complications were observed.

The patient was transferred to the postanesthesia care unit in
stable hemodynamic condition.
IMPRESSION: 1. Successful coil embolization of a ruptured right posterior
communicating artery aneurysm. Some contrast filling is seen within
the aneurysm post embolization with significant stasis.

The preliminary results of this procedure were shared with the
patient's family.

## 2020-05-20 IMAGING — CT CT ANGIO NECK
2 of 11 series · 8 of 35 positions shown · IV contrast (OMNI 350)
Comparison: MRI head [DATE]

CLINICAL DATA: Right-sided headache.  Rule out aneurysm.

EXAM:
CT ANGIOGRAPHY HEAD AND NECK
TECHNIQUE: Multidetector CT imaging of the head and neck was performed using
the standard protocol during bolus administration of intravenous
contrast. Multiplanar CT image reconstructions and MIPs were
obtained to evaluate the vascular anatomy. Carotid stenosis
measurements (when applicable) are obtained utilizing NASCET
criteria, using the distal internal carotid diameter as the
denominator.
CONTRAST:  75mL OMNIPAQUE IOHEXOL 350 MG/ML SOLN

[Series 10: cta neck · axial · 0.45mm/px · z∈[-299,+51]mm · 3 of 176 slices shown]
[im 1/176  soft-tissue]
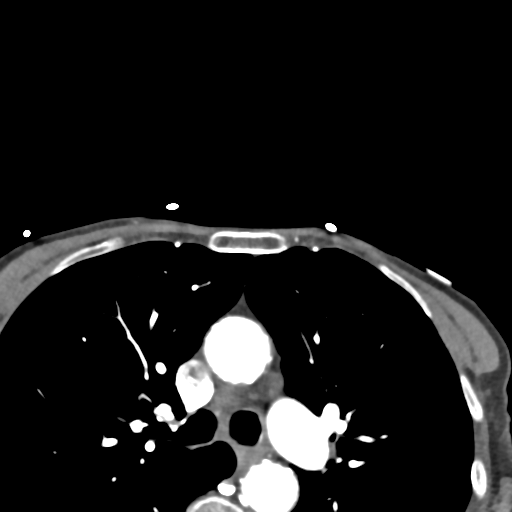
[im 88/176  bone]
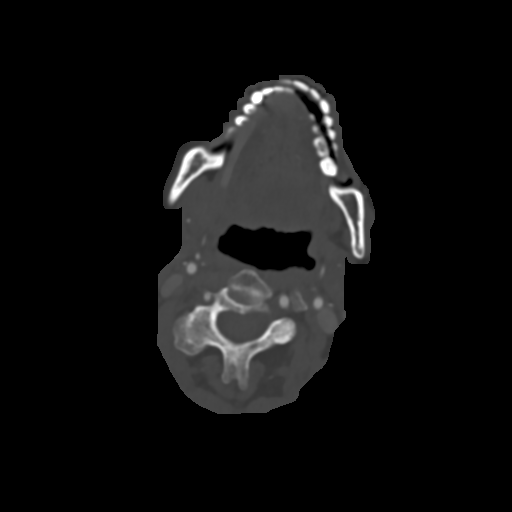
[im 176/176  soft-tissue]
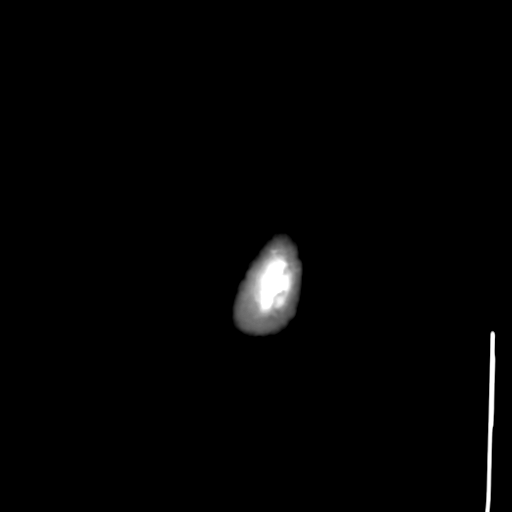

[Series 12: cta neck axial · axial · 0.39mm/px · z∈[-241,-19]mm · 5 of 350 slices shown]
[im 59/350  soft-tissue]
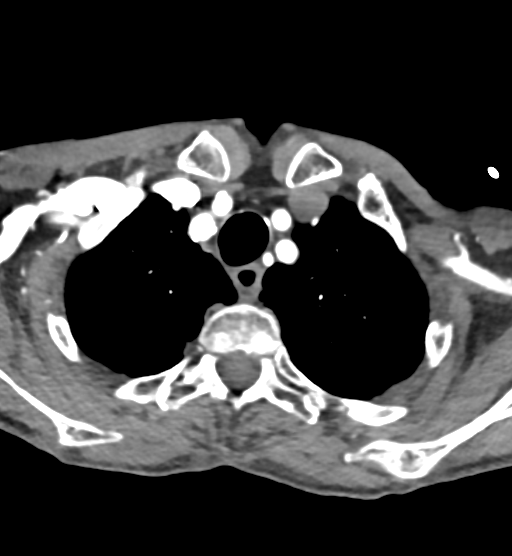
[im 117/350  soft-tissue]
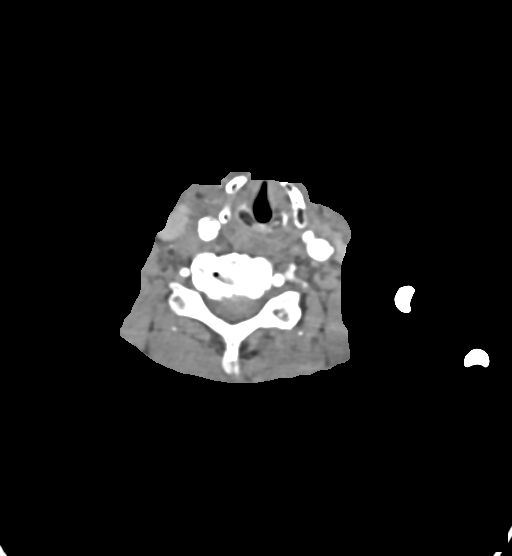
[im 175/350  soft-tissue]
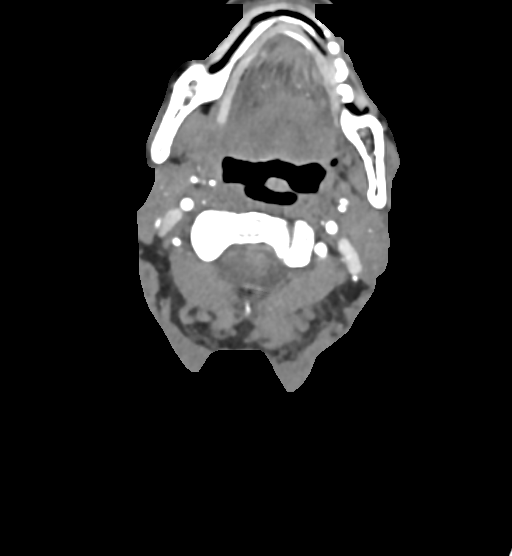
[im 233/350  soft-tissue]
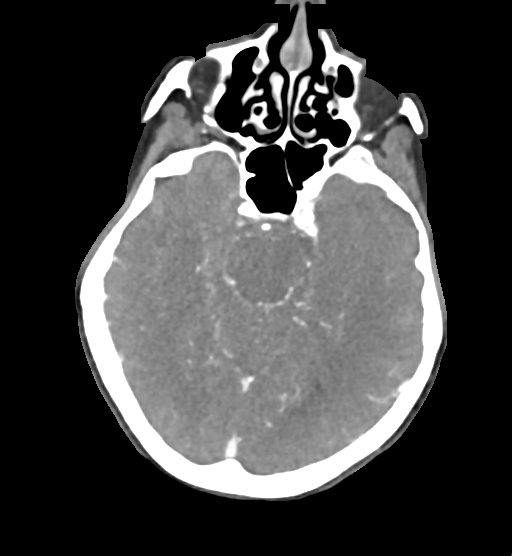
[im 291/350  soft-tissue]
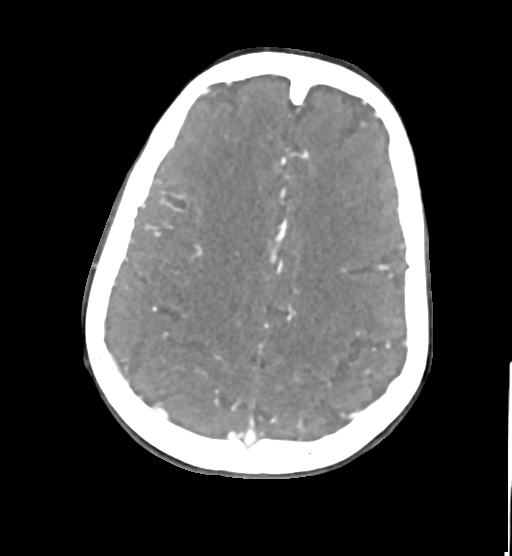

[8 of 35 positions shown; findings below may reference images not displayed]

FINDINGS: CT HEAD FINDINGS

Brain: Acute subarachnoid hemorrhage. There is a moderate amount of
subarachnoid hemorrhage which is predominately right-sided extending
into the sylvian fissure. There is blood in the suprasellar cistern
and prepontine cistern also extending to the interhemispheric
fissure anteriorly. Ventricle size normal. Small amount of
hemorrhage accumulated in the occipital horns bilaterally.

Mild patchy hypodensity in the cerebral white matter bilaterally
consistent with chronic microvascular ischemia. No acute infarct or
mass.

Vascular: Obscured by subarachnoid hemorrhage.

Skull: No acute skeletal abnormality. Total of 4 screws in the
frontal bone bilaterally, 2 on each sign

Sinuses: Mild mucosal edema paranasal sinuses.

Orbits: Negative

Review of the MIP images confirms the above findings

CTA NECK FINDINGS

Aortic arch: Atherosclerotic calcification in the aortic arch
without aneurysm. Bovine branching pattern. Mild stenosis proximal
left common carotid artery. Atherosclerotic calcification throughout
the proximal great vessels.

Right carotid system: Mild atherosclerotic calcification right
common carotid artery. Atherosclerotic calcification right carotid
bifurcation. 30% diameter stenosis proximal right internal carotid
artery. Mild stenosis origin of right external carotid artery.

Left carotid system: Atherosclerotic calcification left common
carotid artery with mild stenosis at the origin. Atherosclerotic
calcification left carotid bifurcation without significant stenosis.

Vertebral arteries: Left vertebral dominant. Both vertebral arteries
patent to the basilar without stenosis or aneurysm.

Skeleton: Degenerative changes throughout the cervical spine with
disc and facet degeneration. No acute skeletal abnormality.

Other neck: Negative for mass or adenopathy.

Upper chest: Extensive apical emphysema bilaterally. Apical scarring
bilaterally.

Review of the MIP images confirms the above findings

CTA HEAD FINDINGS

Anterior circulation: Atherosclerotic calcification in the cavernous
carotid bilaterally without stenosis. Anterior and middle cerebral
arteries patent bilaterally without stenosis

Right posterior communicating artery aneurysm measuring 6 x 8 mm. No
extravasation. Small right posterior communicating artery is patent.

Posterior circulation: Both vertebral arteries patent to the
basilar. PICA patent bilaterally. Basilar patent. Superior
cerebellar and posterior cerebral arteries patent bilaterally. Small
posterior communicating artery bilaterally.

Venous sinuses: Normal venous enhancement

Anatomic variants: None

Review of the MIP images confirms the above findings
IMPRESSION: 1. Acute subarachnoid hemorrhage predominately on the right side.
Mild amount of blood in the ventricles without hydrocephalus.
2. 6 x 8 mm right posterior communicating artery aneurysm, source of
subarachnoid hemorrhage. No other aneurysm
3. Moderate atherosclerotic disease involving the aortic arch and
carotid artery bilaterally. No flow limiting stenosis in the carotid
or vertebral artery.
4. These results were called by telephone at the time of
interpretation on [DATE] at [DATE] to provider SIW
, who verbally acknowledged these results.

## 2020-05-20 SURGERY — IR WITH ANESTHESIA
Anesthesia: General

## 2020-05-20 SURGERY — CRANIOTOMY INTRACRANIAL ANEURYSM FOR CAROTID
Anesthesia: General

## 2020-05-20 MED ORDER — CARVEDILOL 3.125 MG PO TABS: 3.1250 mg | ORAL_TABLET | Freq: Two times a day (BID) | ORAL | Status: DC

## 2020-05-20 MED ORDER — LABETALOL HCL 5 MG/ML IV SOLN: 20.0000 mg | Freq: Once | INTRAVENOUS | Status: DC

## 2020-05-20 MED ORDER — ACETAMINOPHEN 10 MG/ML IV SOLN: 550.0000 mg | Freq: Once | INTRAVENOUS | Status: DC | PRN

## 2020-05-20 MED ORDER — ACETAMINOPHEN 650 MG RE SUPP: 650.0000 mg | RECTAL | Status: DC | PRN

## 2020-05-20 MED ORDER — SIMVASTATIN 20 MG PO TABS
20.0000 mg | ORAL_TABLET | Freq: Every day | ORAL | Status: DC
Start: 2020-05-21 — End: 2020-05-23
  Administered 2020-05-21: 20 mg via ORAL
  Filled 2020-05-20 (×2): qty 1

## 2020-05-20 MED ORDER — UMECLIDINIUM BROMIDE 62.5 MCG/INH IN AEPB
2.0000 | INHALATION_SPRAY | Freq: Every day | RESPIRATORY_TRACT | Status: DC
  Administered 2020-05-23 – 2020-05-30 (×7): 2 via RESPIRATORY_TRACT
  Filled 2020-05-20 (×2): qty 7

## 2020-05-20 MED ORDER — FENTANYL CITRATE (PF) 250 MCG/5ML IJ SOLN
INTRAMUSCULAR | Status: DC | PRN
  Administered 2020-05-20: 50 ug via INTRAVENOUS
  Administered 2020-05-20: 100 ug via INTRAVENOUS

## 2020-05-20 MED ORDER — PHENYLEPHRINE 40 MCG/ML (10ML) SYRINGE FOR IV PUSH (FOR BLOOD PRESSURE SUPPORT)
PREFILLED_SYRINGE | INTRAVENOUS | Status: AC
  Filled 2020-05-20: qty 20

## 2020-05-20 MED ORDER — SODIUM CHLORIDE 0.9 % IV SOLN: INTRAVENOUS | Status: DC

## 2020-05-20 MED ORDER — LIDOCAINE HCL 1 % IJ SOLN
INTRAMUSCULAR | Status: AC
  Filled 2020-05-20: qty 20

## 2020-05-20 MED ORDER — IOHEXOL 300 MG/ML  SOLN
150.0000 mL | Freq: Once | INTRAMUSCULAR | Status: AC | PRN
  Administered 2020-05-20: 25 mL via INTRA_ARTERIAL

## 2020-05-20 MED ORDER — PHENYLEPHRINE 40 MCG/ML (10ML) SYRINGE FOR IV PUSH (FOR BLOOD PRESSURE SUPPORT)
PREFILLED_SYRINGE | INTRAVENOUS | Status: DC | PRN
  Administered 2020-05-20: 120 ug via INTRAVENOUS
  Administered 2020-05-20: 80 ug via INTRAVENOUS
  Administered 2020-05-20: 40 ug via INTRAVENOUS

## 2020-05-20 MED ORDER — STROKE: EARLY STAGES OF RECOVERY BOOK: Freq: Once | Status: AC

## 2020-05-20 MED ORDER — CARVEDILOL 3.125 MG PO TABS
3.1250 mg | ORAL_TABLET | Freq: Two times a day (BID) | ORAL | Status: DC
  Administered 2020-05-21 (×2): 3.125 mg via ORAL
  Filled 2020-05-20 (×4): qty 1

## 2020-05-20 MED ORDER — ONDANSETRON HCL 4 MG/2ML IJ SOLN
INTRAMUSCULAR | Status: DC | PRN
  Administered 2020-05-20: 4 mg via INTRAVENOUS

## 2020-05-20 MED ORDER — ONDANSETRON 4 MG PO TBDP: 4.0000 mg | ORAL_TABLET | Freq: Four times a day (QID) | ORAL | Status: DC | PRN

## 2020-05-20 MED ORDER — ALBUTEROL SULFATE HFA 108 (90 BASE) MCG/ACT IN AERS: 2.0000 | INHALATION_SPRAY | Freq: Four times a day (QID) | RESPIRATORY_TRACT | Status: DC | PRN

## 2020-05-20 MED ORDER — ACETAMINOPHEN 160 MG/5ML PO SOLN
650.0000 mg | ORAL | Status: DC | PRN
  Administered 2020-05-24 – 2020-06-04 (×24): 650 mg
  Filled 2020-05-20 (×24): qty 20.3

## 2020-05-20 MED ORDER — TETRACAINE HCL 0.5 % OP SOLN
2.0000 [drp] | Freq: Once | OPHTHALMIC | Status: DC
  Filled 2020-05-20: qty 4

## 2020-05-20 MED ORDER — PANTOPRAZOLE SODIUM 40 MG PO PACK
40.0000 mg | PACK | Freq: Every day | ORAL | Status: DC
  Filled 2020-05-20 (×2): qty 20

## 2020-05-20 MED ORDER — POLYETHYLENE GLYCOL 3350 17 G PO PACK
17.0000 g | PACK | Freq: Every day | ORAL | Status: DC | PRN
  Filled 2020-05-20: qty 1

## 2020-05-20 MED ORDER — IOHEXOL 350 MG/ML SOLN
75.0000 mL | Freq: Once | INTRAVENOUS | Status: AC | PRN
  Administered 2020-05-20: 75 mL via INTRAVENOUS

## 2020-05-20 MED ORDER — DEXAMETHASONE SODIUM PHOSPHATE 10 MG/ML IJ SOLN
INTRAMUSCULAR | Status: DC | PRN
  Administered 2020-05-20: 5 mg via INTRAVENOUS

## 2020-05-20 MED ORDER — ACETAMINOPHEN 325 MG PO TABS
650.0000 mg | ORAL_TABLET | Freq: Once | ORAL | Status: DC
  Filled 2020-05-20: qty 2

## 2020-05-20 MED ORDER — NIMODIPINE 30 MG PO CAPS
60.0000 mg | ORAL_CAPSULE | ORAL | Status: DC
  Administered 2020-05-21: 60 mg via ORAL
  Filled 2020-05-20 (×3): qty 2

## 2020-05-20 MED ORDER — PANTOPRAZOLE SODIUM 40 MG PO TBEC
40.0000 mg | DELAYED_RELEASE_TABLET | Freq: Every day | ORAL | Status: DC
  Administered 2020-05-21: 40 mg via ORAL
  Filled 2020-05-20: qty 1

## 2020-05-20 MED ORDER — SUGAMMADEX SODIUM 200 MG/2ML IV SOLN
INTRAVENOUS | Status: DC | PRN
  Administered 2020-05-20: 100 mg via INTRAVENOUS

## 2020-05-20 MED ORDER — ROCURONIUM BROMIDE 10 MG/ML (PF) SYRINGE
PREFILLED_SYRINGE | INTRAVENOUS | Status: DC | PRN
  Administered 2020-05-20: 20 mg via INTRAVENOUS
  Administered 2020-05-20: 40 mg via INTRAVENOUS

## 2020-05-20 MED ORDER — PROPOFOL 10 MG/ML IV BOLUS
INTRAVENOUS | Status: DC | PRN
  Administered 2020-05-20: 20 mg via INTRAVENOUS
  Administered 2020-05-20: 50 mg via INTRAVENOUS
  Administered 2020-05-20: 20 mg via INTRAVENOUS

## 2020-05-20 MED ORDER — ACETAMINOPHEN 325 MG PO TABS
650.0000 mg | ORAL_TABLET | ORAL | Status: DC | PRN
  Filled 2020-05-20: qty 2

## 2020-05-20 MED ORDER — DOCUSATE SODIUM 100 MG PO CAPS
100.0000 mg | ORAL_CAPSULE | Freq: Two times a day (BID) | ORAL | Status: DC
  Administered 2020-05-21: 100 mg via ORAL
  Filled 2020-05-20: qty 1

## 2020-05-20 MED ORDER — IOHEXOL 300 MG/ML  SOLN
150.0000 mL | Freq: Once | INTRAMUSCULAR | Status: AC | PRN
  Administered 2020-05-20: 20 mL via INTRA_ARTERIAL

## 2020-05-20 MED ORDER — FENTANYL CITRATE (PF) 100 MCG/2ML IJ SOLN
50.0000 ug | Freq: Once | INTRAMUSCULAR | Status: AC
  Administered 2020-05-20: 50 ug via INTRAVENOUS
  Filled 2020-05-20: qty 2

## 2020-05-20 MED ORDER — ALBUTEROL SULFATE (2.5 MG/3ML) 0.083% IN NEBU: 2.5000 mg | INHALATION_SOLUTION | Freq: Four times a day (QID) | RESPIRATORY_TRACT | Status: DC | PRN

## 2020-05-20 MED ORDER — CHLORHEXIDINE GLUCONATE CLOTH 2 % EX PADS
6.0000 | MEDICATED_PAD | Freq: Every day | CUTANEOUS | Status: DC
  Administered 2020-05-20 – 2020-06-04 (×16): 6 via TOPICAL

## 2020-05-20 MED ORDER — NIMODIPINE 6 MG/ML PO SOLN
60.0000 mg | ORAL | Status: DC
  Filled 2020-05-20 (×6): qty 10

## 2020-05-20 MED ORDER — FENTANYL CITRATE (PF) 100 MCG/2ML IJ SOLN
INTRAMUSCULAR | Status: AC
  Administered 2020-05-20: 25 ug via INTRAVENOUS
  Filled 2020-05-20: qty 2

## 2020-05-20 MED ORDER — ONDANSETRON HCL 4 MG/2ML IJ SOLN
4.0000 mg | Freq: Four times a day (QID) | INTRAMUSCULAR | Status: DC | PRN
  Administered 2020-05-21: 4 mg via INTRAVENOUS
  Filled 2020-05-20: qty 2

## 2020-05-20 MED ORDER — LIDOCAINE 2% (20 MG/ML) 5 ML SYRINGE
INTRAMUSCULAR | Status: DC | PRN
  Administered 2020-05-20: 60 mg via INTRAVENOUS

## 2020-05-20 MED ORDER — SODIUM CHLORIDE 0.9 % IV SOLN: INTRAVENOUS | Status: DC | PRN

## 2020-05-20 MED ORDER — NICARDIPINE HCL IN NACL 20-0.86 MG/200ML-% IV SOLN: 0.0000 mg/h | INTRAVENOUS | Status: DC

## 2020-05-20 MED ORDER — FENTANYL CITRATE (PF) 100 MCG/2ML IJ SOLN
25.0000 ug | INTRAMUSCULAR | Status: DC | PRN
  Administered 2020-05-20: 25 ug via INTRAVENOUS

## 2020-05-20 MED ORDER — PHENYLEPHRINE HCL-NACL 10-0.9 MG/250ML-% IV SOLN
INTRAVENOUS | Status: DC | PRN
  Administered 2020-05-20: 20 ug/min via INTRAVENOUS

## 2020-05-20 MED ORDER — FLUTICASONE FUROATE-VILANTEROL 100-25 MCG/INH IN AEPB
1.0000 | INHALATION_SPRAY | Freq: Every day | RESPIRATORY_TRACT | Status: DC
  Administered 2020-05-22 – 2020-05-30 (×8): 1 via RESPIRATORY_TRACT
  Filled 2020-05-20: qty 28

## 2020-05-20 MED ORDER — PROPOFOL 10 MG/ML IV BOLUS
INTRAVENOUS | Status: AC
  Filled 2020-05-20: qty 20

## 2020-05-20 MED ORDER — LABETALOL HCL 5 MG/ML IV SOLN: 5.0000 mg | Freq: Once | INTRAVENOUS | Status: DC

## 2020-05-20 MED ORDER — MORPHINE SULFATE (PF) 2 MG/ML IV SOLN
1.0000 mg | INTRAVENOUS | Status: DC | PRN
  Administered 2020-05-20: 2 mg via INTRAVENOUS
  Administered 2020-05-20: 1 mg via INTRAVENOUS
  Administered 2020-05-21 – 2020-05-22 (×5): 2 mg via INTRAVENOUS
  Filled 2020-05-20 (×7): qty 1

## 2020-05-20 MED ORDER — ACETAMINOPHEN 10 MG/ML IV SOLN
INTRAVENOUS | Status: AC
  Administered 2020-05-20: 550 mg via INTRAVENOUS
  Filled 2020-05-20: qty 100

## 2020-05-20 MED ORDER — ONDANSETRON HCL 4 MG/2ML IJ SOLN: 4.0000 mg | Freq: Once | INTRAMUSCULAR | Status: DC | PRN

## 2020-05-20 MED ORDER — EPHEDRINE SULFATE-NACL 50-0.9 MG/10ML-% IV SOSY
PREFILLED_SYRINGE | INTRAVENOUS | Status: DC | PRN
  Administered 2020-05-20: 5 mg via INTRAVENOUS
  Administered 2020-05-20 (×4): 10 mg via INTRAVENOUS

## 2020-05-20 MED ORDER — FENTANYL CITRATE (PF) 250 MCG/5ML IJ SOLN
INTRAMUSCULAR | Status: AC
  Filled 2020-05-20: qty 5

## 2020-05-20 MED ORDER — LOSARTAN POTASSIUM 25 MG PO TABS
12.5000 mg | ORAL_TABLET | Freq: Every day | ORAL | Status: DC
  Administered 2020-05-21: 12.5 mg via ORAL
  Filled 2020-05-20 (×4): qty 0.5

## 2020-05-20 NOTE — ED Triage Notes (Addendum)
[facility] for 10/10 Right sided Headache, Right sided neck pain, right sided eye pain. Pt denies taking any medication at home to help with pain. Was D/C'd from [facility] on Sunday.

## 2020-05-20 NOTE — Anesthesia Preprocedure Evaluation (Addendum)
Anesthesia Evaluation  Patient identified by MRN, date of birth, ID band Patient awake    Reviewed: Allergy & Precautions, NPO status , Patient's Chart, lab work & pertinent test results  Airway Mallampati: III  TM Distance: >3 FB Neck ROM: Full    Dental  (+) Edentulous Upper, Edentulous Lower   Pulmonary COPD,  COPD inhaler, Current Smoker and Patient abstained from smoking.,    Pulmonary exam normal breath sounds clear to auscultation       Cardiovascular hypertension, Pt. on home beta blockers and Pt. on medications + CAD, + Past MI and + Cardiac Stents  Normal cardiovascular exam Rhythm:Regular Rate:Normal  ECG: SR, rate 63  CATH The left ventricular ejection fraction is 25-35% by visual estimate. 1. Patent RCA stent with mild in-stent restenosis 2. Patent left main 3. Patent LAD with mild to moderate nonobstructive stenosis in the mid vessel and a segment of intramyocardial bridging 4. Patent left circumflex with minimal nonobstructive disease 5. Severe LV dysfunction with contractile pattern consistent with acute Takotsubo syndrome/periapical ballooning, vigorous basal contraction, LVEF  less than 35%   Neuro/Psych PSYCHIATRIC DISORDERS Depression Head CT revealed a SAH CT angio identified a right posterior communicating artery aneurysm.      GI/Hepatic negative GI ROS, Neg liver ROS,   Endo/Other  negative endocrine ROS  Renal/GU negative Renal ROS     Musculoskeletal negative musculoskeletal ROS (+)   Abdominal   Peds  Hematology  (+) anemia , HLD   Anesthesia Other Findings ANUERYSM  Reproductive/Obstetrics                            Anesthesia Physical Anesthesia Plan  ASA: IV and emergent  Anesthesia Plan: General   Post-op Pain Management:    Induction: Intravenous  PONV Risk Score and Plan: 2 and Ondansetron, Treatment may vary due to age or medical  condition and Dexamethasone  Airway Management Planned: Oral ETT  Additional Equipment: Arterial line  Intra-op Plan:   Post-operative Plan: Extubation in OR  Informed Consent: I have reviewed the patients History and Physical, chart, labs and discussed the procedure including the risks, benefits and alternatives for the proposed anesthesia with the patient or authorized representative who has indicated his/her understanding and acceptance.       Plan Discussed with: CRNA  Anesthesia Plan Comments:        Anesthesia Quick Evaluation

## 2020-05-20 NOTE — ED Notes (Signed)
Patient transported to CT via stretcher in stable condition 

## 2020-05-20 NOTE — Anesthesia Postprocedure Evaluation (Signed)
Anesthesia Post Note  Patient: Alexandra Mays  Procedure(s) Performed: IR WITH ANESTHESIA - ANEURYSM (N/A )     Patient location during evaluation: PACU Anesthesia Type: General Level of consciousness: awake Pain management: pain level controlled Vital Signs Assessment: post-procedure vital signs reviewed and stable Respiratory status: spontaneous breathing, nonlabored ventilation, respiratory function stable and patient connected to nasal cannula oxygen Cardiovascular status: blood pressure returned to baseline and stable Postop Assessment: no apparent nausea or vomiting Anesthetic complications: no   No complications documented.  Last Vitals:  Vitals:   05/20/20 1940 05/20/20 2000  BP:  (!) 102/59  Pulse:  66  Resp:  14  Temp: (!) 36.1 C   SpO2:      Last Pain:  Vitals:   05/20/20 2000  TempSrc:   PainSc: 8                  Georges Victorio P [facility] [place]

## 2020-05-20 NOTE — Brief Op Note (Signed)
  NEUROSURGERY BRIEF OPERATIVE  NOTE   PREOP DX: Subarachnoid Hemorrhage  POSTOP DX: Same  PROCEDURE: Diagnostic cerebral angiogram, coil embolization of right posterior communicating artery aneurysm  SURGEON: Dr. Lisbeth Renshaw, MD  ANESTHESIA: GETA  EBL: Minimal  SPECIMENS: None  COMPLICATIONS: None  CONDITION: Stable to recovery  FINDINGS (Full report in [facility]): 1. Successful coil embolization of right posterior communicating artery aneurysm. Some filling of the aneurysm post-embolization although with significant stasis.   Lisbeth Renshaw, MD [facility]

## 2020-05-20 NOTE — ED Provider Notes (Signed)
[facility] Provider Note   CSN: [id number] Arrival date & time: 05/20/20  [id number]     History Chief Complaint  Patient presents with  . Headache    Alexandra Mays is a 70 y.o. female presenting for evaluation of headache.  Patient states she was awoken from sleep at 3 AM with sudden right sided head, neck, and right eye pain.  Pain has been constant since.  She has not taken anything for it including Tylenol ibuprofen.  She also had some shortness of breath at the time, which she states is consistent with her COPD, that was slightly worse than normal.  She took Spiriva for her shortness of breath without significant improvement of shortness of breath that made her headache feel worse.  She initially denies vision changes, then states she is having some blurry vision of both eyes.  No visual loss.  She denies fevers, chills, chest pain, worsening or new cough, nausea, vomiting, abdominal pain, change in urination or bowel movements.  No numbness, tingling, weakness.  No previous history of eye problems other than needing glasses.  Additional history obtained from chart review.  Patient with a history of NSTEMI last week, thought to be due to Takotsubo cardiomyopathy.  EF of 20%, however cath was overall reassuring.  Also has a history of a AAA with repair in 2018.  She is on aspirin, no other blood thinners.  Per patient and daughter, it is thought that patient has not taken her aspirin since discharge on 05/17/2019.   Additional history of AAA, COPD, hypertension.  HPI     Past Medical History:  Diagnosis Date  . AAA (abdominal aortic aneurysm) (HCC)    AAA  . COPD (chronic obstructive pulmonary disease) (HCC)   . Dyspnea    with exertion  . Pure hyperglyceridemia     Patient Active Problem List   Diagnosis Date Noted  . Acute respiratory failure with hypoxia (HCC) 05/14/2020  . Essential hypertension 05/14/2020  . Leukocytosis  05/14/2020  . Hyperlipemia 05/14/2020  . Tobacco abuse 05/14/2020  . Stress-induced cardiomyopathy   . NSTEMI (non-ST elevated myocardial infarction) (HCC) 05/13/2020  . S/P aortic aneurysm repair 07/24/2016  . AAA (abdominal aortic aneurysm) (HCC) 07/24/2016  . AAA (abdominal aortic aneurysm) without rupture (HCC) 04/21/2015  . Mild cognitive impairment with memory loss 02/17/2015  . Depressive disorder 02/17/2015    Past Surgical History:  Procedure Laterality Date  . ABDOMINAL AORTIC ANEURYSM REPAIR N/A 07/24/2016   Procedure: ABDOMINAL AORTIC ANEURYSM  REPAIR;  Surgeon: Sherren KernsFields, Charles E, MD;  [facility];  Service: Vascular;  Laterality: N/A;  . cardiac stenting  2007  . ENDARTERECTOMY Left 07/24/2016   Procedure: AORTA TO LEFT ILIAC ENDARTERECTOMY;  Surgeon: Sherren KernsFields, Charles E, MD;  [facility];  Service: Vascular;  Laterality: Left;  . ENDARTERECTOMY FEMORAL Right 07/24/2016   Procedure: AORTA TO RIGHT FEMORAL ENDARTERECTOMY;  Surgeon: Sherren KernsFields, Charles E, MD;  [facility];  Service: Vascular;  Laterality: Right;  . RIGHT/LEFT HEART CATH AND CORONARY ANGIOGRAPHY N/A 05/14/2020   Procedure: RIGHT/LEFT HEART CATH AND CORONARY ANGIOGRAPHY;  Surgeon: Tonny Bollmanooper, Michael, MD;  [facility];  Service: Cardiovascular;  Laterality: N/A;  . TOE SURGERY Left    3 rd toe Hammer toe     OB History   No obstetric history on file.     Family History  Problem Relation Age of Onset  . Stroke Mother     Social  History   Tobacco Use  . Smoking status: Current Every Day Smoker    Packs/day: 0.75    Years: 40.00    Pack years: 30.00    Types: Cigarettes  . Smokeless tobacco: Never Used  . Tobacco comment: 0.5ppd as of 10/31/19  Vaping Use  . Vaping Use: Never used  Substance Use Topics  . Alcohol use: No    Alcohol/week: 0.0 standard drinks  . Drug use: No    Home Medications Prior to Admission medications   Medication Sig Start Date End Date Taking?  Authorizing Provider  albuterol (PROVENTIL HFA;VENTOLIN HFA) 108 (90 Base) MCG/ACT inhaler Inhale 2 puffs into the lungs every 6 (six) hours as needed for wheezing or shortness of breath.    [provider]  albuterol (PROVENTIL) (2.5 MG/3ML) 0.083% nebulizer solution Take 3 mLs (2.5 mg total) by nebulization every 6 (six) hours as needed for wheezing or shortness of breath. 11/21/19   Hunsucker, Lesia Sago, MD  BREO ELLIPTA 100-25 MCG/INH AEPB Inhale 1 puff into the lungs daily. 04/16/20   [provider]  carvedilol (COREG) 3.125 MG tablet Take 1 tablet (3.125 mg total) by mouth 2 (two) times daily with a meal. 05/16/20   Zannie Cove, MD  ibuprofen (ADVIL,MOTRIN) 200 MG tablet Take 800 mg by mouth every 6 (six) hours as needed for mild pain.    [provider]  losartan (COZAAR) 25 MG tablet Take 0.5 tablets (12.5 mg total) by mouth daily. 05/16/20   Zannie Cove, MD  polyethylene glycol [facility] Ethelene Hal) packet Take 17 g by mouth daily. Patient taking differently: Take 17 g by mouth daily as needed for mild constipation. 06/23/16   Forest Becker, MD  predniSONE (DELTASONE) 20 MG tablet Take 1 tablet (20 mg total) by mouth daily with breakfast for 3 days. 05/17/20 05/20/20  Zannie Cove, MD  simvastatin (ZOCOR) 20 MG tablet Take 20 mg by mouth every morning.     [provider]  Tiotropium Bromide Monohydrate 2.5 MCG/ACT AERS Inhale 2 puffs into the lungs daily.    [provider]    Allergies    No known allergies  Review of Systems   Review of Systems  Eyes: Positive for visual disturbance (blurry vision bilaterally).  Musculoskeletal: Positive for neck pain.  Neurological: Positive for headaches.  All other systems reviewed and are negative.   Physical Exam Updated Vital Signs BP (!) 127/59   Pulse (!) 59   Temp 97.9 F (36.6 C) (Oral)   Resp (!) 9   Ht 5\' 4"  (1.626 m)   Wt 37.8 kg   SpO2 90%   BMI 14.30 kg/m   Physical  Exam Vitals and nursing note reviewed.  Constitutional:      General: She is not in acute distress.    Appearance: She is well-developed.     Comments: Appears uncomfortable due to pain.   HENT:     Head: Normocephalic and atraumatic.  Eyes:     Extraocular Movements: Extraocular movements intact.     Conjunctiva/sclera: Conjunctivae normal.     Pupils: Pupils are equal, round, and reactive to light.     Comments: EOMI and PERRLA. No ttp over the L temple.   Neck:     Vascular: No carotid bruit.     Comments: No carotid bruit  Cardiovascular:     Rate and Rhythm: Normal rate and regular rhythm.     Pulses: Normal pulses.  Pulmonary:     Effort:  Pulmonary effort is normal. No respiratory distress.     Breath sounds: Normal breath sounds. No wheezing.  Abdominal:     General: There is no distension.     Palpations: Abdomen is soft. There is no mass.     Tenderness: There is no abdominal tenderness. There is no guarding or rebound.  Musculoskeletal:        General: Normal range of motion.     Cervical back: Normal range of motion and neck supple.  Skin:    General: Skin is warm and dry.     Capillary Refill: Capillary refill takes less than 2 seconds.  Neurological:     Mental Status: She is alert and oriented to person, place, and time.     GCS: GCS eye subscore is 4. GCS verbal subscore is 5. GCS motor subscore is 6.     Cranial Nerves: Cranial nerves are intact.     Sensory: Sensation is intact.     Motor: Motor function is intact.     Comments: Strength and sensation intact x4. CN intact.      ED Results / Procedures / Treatments   Labs (all labs ordered are listed, but only abnormal results are displayed) Labs Reviewed  CBC WITH DIFFERENTIAL/PLATELET - Abnormal; Notable for the following components:      Result Value   RBC 3.77 (*)    HCT 35.8 (*)    All other components within normal limits  I-STAT CHEM 8, ED - Abnormal; Notable for the following components:    Calcium, Ion 1.07 (*)    Hemoglobin 11.9 (*)    HCT 35.0 (*)    All other components within normal limits  RESP PANEL BY RT-PCR (FLU A&B, COVID) ARPGX2  PROTIME-INR  APTT  COMPREHENSIVE METABOLIC PANEL  TYPE AND SCREEN    EKG EKG Interpretation  Date/Time:  Thursday May 20 2020 09:08:02 EST Ventricular Rate:  63 PR Interval:    QRS Duration: 112 QT Interval:  447 QTC Calculation: 458 R Axis:   73 Text Interpretation: Sinus rhythm Consider right atrial enlargement Anteroseptal infarct, old Nonspecific T abnormalities, inferior leads NSR, T wave changes v3-v6 present on previous Confirmed by Coralee Pesa [phone]) on 05/20/2020 11:10:26 AM   Radiology CT Angio Head W or Wo Contrast  Result Date: 05/20/2020 CLINICAL DATA:  Right-sided headache.  Rule out aneurysm. EXAM: CT ANGIOGRAPHY HEAD AND NECK TECHNIQUE: Multidetector CT imaging of the head and neck was performed using the standard protocol during bolus administration of intravenous contrast. Multiplanar CT image reconstructions and MIPs were obtained to evaluate the vascular anatomy. Carotid stenosis measurements (when applicable) are obtained utilizing NASCET criteria, using the distal internal carotid diameter as the denominator. CONTRAST:  75mL OMNIPAQUE IOHEXOL 350 MG/ML SOLN COMPARISON:  MRI head 06/03/2015 FINDINGS: CT HEAD FINDINGS Brain: Acute subarachnoid hemorrhage. There is a moderate amount of subarachnoid hemorrhage which is predominately right-sided extending into the sylvian fissure. There is blood in the suprasellar cistern and prepontine cistern also extending to the interhemispheric fissure anteriorly. Ventricle size normal. Small amount of hemorrhage accumulated in the occipital horns bilaterally. Mild patchy hypodensity in the cerebral white matter bilaterally consistent with chronic microvascular ischemia. No acute infarct or mass. Vascular: Obscured by subarachnoid hemorrhage. Skull: No acute skeletal abnormality.  Total of 4 screws in the frontal bone bilaterally, 2 on each sign Sinuses: Mild mucosal edema paranasal sinuses. Orbits: Negative Review of the MIP images confirms the above findings CTA NECK FINDINGS Aortic arch: Atherosclerotic calcification  in the aortic arch without aneurysm. Bovine branching pattern. Mild stenosis proximal left common carotid artery. Atherosclerotic calcification throughout the proximal great vessels. Right carotid system: Mild atherosclerotic calcification right common carotid artery. Atherosclerotic calcification right carotid bifurcation. 30% diameter stenosis proximal right internal carotid artery. Mild stenosis origin of right external carotid artery. Left carotid system: Atherosclerotic calcification left common carotid artery with mild stenosis at the origin. Atherosclerotic calcification left carotid bifurcation without significant stenosis. Vertebral arteries: Left vertebral dominant. Both vertebral arteries patent to the basilar without stenosis or aneurysm. Skeleton: Degenerative changes throughout the cervical spine with disc and facet degeneration. No acute skeletal abnormality. Other neck: Negative for mass or adenopathy. Upper chest: Extensive apical emphysema bilaterally. Apical scarring bilaterally. Review of the MIP images confirms the above findings CTA HEAD FINDINGS Anterior circulation: Atherosclerotic calcification in the cavernous carotid bilaterally without stenosis. 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 No extravasation. Small right posterior communicating artery is patent. Posterior circulation: Both vertebral arteries patent to the basilar. PICA patent bilaterally. Basilar patent. Superior cerebellar and posterior cerebral arteries patent bilaterally. Small posterior communicating artery bilaterally. Venous sinuses: Normal venous enhancement Anatomic variants: None  Review of the MIP images confirms the above findings IMPRESSION: 1. Acute subarachnoid hemorrhage predominately on the right side. Mild amount of blood in the ventricles without hydrocephalus. 2. 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 No other aneurysm 3. Moderate atherosclerotic disease involving the aortic arch and carotid artery bilaterally. No flow limiting stenosis in the carotid or vertebral artery. 4. These results were called by telephone at the time of interpretation on 05/20/2020 at 10:52 am to provider [facility] , who verbally acknowledged these results. Electronically Signed   By: Marlan Palau M.D.   On: 05/20/2020 10:53   CT Angio Neck W and/or Wo Contrast  Result Date: 05/20/2020 CLINICAL DATA:  Right-sided headache.  Rule out aneurysm. EXAM: CT ANGIOGRAPHY HEAD AND NECK TECHNIQUE: Multidetector CT imaging of the head and neck was performed using the standard protocol during bolus administration of intravenous contrast. Multiplanar CT image reconstructions and MIPs were obtained to evaluate the vascular anatomy. Carotid stenosis measurements (when applicable) are obtained utilizing NASCET criteria, using the distal internal carotid diameter as the denominator. CONTRAST:  75mL OMNIPAQUE IOHEXOL 350 MG/ML SOLN COMPARISON:  MRI head 06/03/2015 FINDINGS: CT HEAD FINDINGS Brain: Acute subarachnoid hemorrhage. There is a moderate amount of subarachnoid hemorrhage which is predominately right-sided extending into the sylvian fissure. There is blood in the suprasellar cistern and prepontine cistern also extending to the interhemispheric fissure anteriorly. Ventricle size normal. Small amount of hemorrhage accumulated in the occipital horns bilaterally. Mild patchy hypodensity in the cerebral white matter bilaterally consistent with chronic microvascular ischemia. No acute infarct or mass. Vascular: Obscured by subarachnoid hemorrhage. Skull: No  acute skeletal abnormality. Total of 4 screws in the frontal bone bilaterally, 2 on each sign Sinuses: Mild mucosal edema paranasal sinuses. Orbits: Negative Review of the MIP images confirms the above findings CTA NECK FINDINGS Aortic arch: Atherosclerotic calcification in the aortic arch without aneurysm. Bovine branching pattern. Mild stenosis proximal left common carotid artery. Atherosclerotic calcification throughout the proximal great vessels. Right carotid system: Mild atherosclerotic calcification right common carotid artery. Atherosclerotic calcification right carotid bifurcation. 30% diameter stenosis proximal right internal carotid artery. Mild stenosis origin of right external carotid artery. Left carotid system: Atherosclerotic calcification left common carotid artery with  mild stenosis at the origin. Atherosclerotic calcification left carotid bifurcation without significant stenosis. Vertebral arteries: Left vertebral dominant. Both vertebral arteries patent to the basilar without stenosis or aneurysm. Skeleton: Degenerative changes throughout the cervical spine with disc and facet degeneration. No acute skeletal abnormality. Other neck: Negative for mass or adenopathy. Upper chest: Extensive apical emphysema bilaterally. Apical scarring bilaterally. Review of the MIP images confirms the above findings CTA HEAD FINDINGS Anterior circulation: Atherosclerotic calcification in the cavernous carotid bilaterally without stenosis. 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 No extravasation. Small right posterior communicating artery is patent. Posterior circulation: Both vertebral arteries patent to the basilar. PICA patent bilaterally. Basilar patent. Superior cerebellar and posterior cerebral arteries patent bilaterally. Small posterior communicating artery bilaterally. Venous sinuses: Normal venous  enhancement Anatomic variants: None Review of the MIP images confirms the above findings IMPRESSION: 1. Acute subarachnoid hemorrhage predominately on the right side. Mild amount of blood in the ventricles without hydrocephalus. 2. 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 No other aneurysm 3. Moderate atherosclerotic disease involving the aortic arch and carotid artery bilaterally. No flow limiting stenosis in the carotid or vertebral artery. 4. These results were called by telephone at the time of interpretation on 05/20/2020 at 10:52 am to provider [facility] , who verbally acknowledged these results. Electronically Signed   By: Marlan Palau M.D.   On: 05/20/2020 10:53    Procedures .Critical Care Performed by: Alveria Apley, PA-C Authorized by: Alveria Apley, PA-C   Critical care provider statement:    Critical care time (minutes):  50   Critical care time was exclusive of:  Separately billable procedures and treating other patients and teaching time   Critical care was necessary to treat or prevent imminent or life-threatening deterioration of the following conditions:  CNS failure or compromise and circulatory failure   Critical care was time spent personally by me on the following activities:  Blood draw for specimens, development of treatment plan with patient or surrogate, discussions with consultants, evaluation of patient's response to treatment, examination of patient, obtaining history from patient or surrogate, ordering and performing treatments and interventions, ordering and review of laboratory studies, ordering and review of radiographic studies, pulse oximetry, re-evaluation of patient's condition and review of old charts   I assumed direction of critical care for this patient from another provider in my specialty: no     Care discussed with: admitting provider   Comments:     Pt with SAH and pcom aneurysm requiring  discussion with neurosurgery admission to the hospital.     Medications Ordered in ED Medications  iohexol (OMNIPAQUE) 350 MG/ML injection 75 mL (75 mLs Intravenous Contrast Given 05/20/20 1031)  fentaNYL (SUBLIMAZE) injection 50 mcg (50 mcg Intravenous Given 05/20/20 1053)    ED Course  I have reviewed the triage vital signs and the nursing notes.  Pertinent labs & imaging results that were available during my care of the patient were reviewed by me and considered in my medical decision making (see chart for details).   MDM Rules/Calculators/A&P                          Pt presenting for evaluation of acute onset right-sided headache, neck pain, and eye pain.  On exam, patient appears uncomfortable due to pain, but otherwise nontoxic.  She is neurologically intact.  Concern  for [facility].  Also consider ischemic cause such as ICA occlusion due to recent cath.  Consider GCA, although less likely as patient does not have tenderness palpation of the temple.  Consider glaucoma, though less likely as patient does not have monocular vision changes.  Consider MSK cause.  Will obtain CTA of the head and neck and labs.   CTA of the head and neck shows a moderate size subarachnoid hemorrhage on the right with a 6 to 8 mm pcom aneurysm. Will consult with neurosurgery. Pt's BP currently 130s systolic after pain control. Will look to keep under 140 systolic.  Patient is not on any blood thinners, has not been on aspirin for several days. Case discussed with attending, Dr. Wilkie Aye evaluated the pt.   Dr. Franky Macho from neurosurgery evaluated the pt. He has no recommendations regarding platelets or other medications at this time. He will admit the pt.   Final Clinical Impression(s) / ED Diagnoses Final diagnoses:  SAH (subarachnoid hemorrhage) (HCC)  Posterior communicating artery aneurysm    Rx / DC Orders ED Discharge Orders    None       Alveria Apley, PA-C 05/20/20 1151    Horton, Clabe Seal,  DO 05/21/20 [id number]

## 2020-05-20 NOTE — Progress Notes (Signed)
°  NEUROSURGERY PROGRESS NOTE   Pt seen in recovery immediately postop.  EXAM:  BP (!) 118/58    Pulse (!) 49    Temp 97.9 F (36.6 C) (Oral)    Resp 12    Ht 5\' 4"  (1.626 m)    Wt 37.8 kg    SpO2 95%    BMI 14.30 kg/m   Easily arouses, Answers simple questions CN grossly intact Follows commands briskly, MAE well Groin site c/d/i  IMPRESSION:  70 y.o. female SAHd#1 immediately postop RPcom aneurysm coiling, appears to be at baseline  PLAN: - Admit to ICU - SBP < 62   , MD [facility]

## 2020-05-20 NOTE — H&P (Signed)
Alexandra Mays is an 70 y.o. female.   Chief Complaint: right neck pain, headache HPI: who was in her usual state of health when this morning approximately 0700 complained of a severe headache, causing her to moan. She was awake, oriented, and moving all extremities. Her daughter called EMS and she was brought to the [facility] ED. 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   Past Medical History:  Diagnosis Date  . AAA (abdominal aortic aneurysm) (HCC)    AAA  . COPD (chronic obstructive pulmonary disease) (HCC)   . Dyspnea    with exertion  . Pure hyperglyceridemia     Past Surgical History:  Procedure Laterality Date  . ABDOMINAL AORTIC ANEURYSM REPAIR N/A 07/24/2016   Procedure: ABDOMINAL AORTIC ANEURYSM  REPAIR;  Surgeon: Sherren Kerns, MD;  Location: [facility] OR;  Service: Vascular;  Laterality: N/A;  . cardiac stenting  2007  . ENDARTERECTOMY Left 07/24/2016   Procedure: AORTA TO LEFT ILIAC ENDARTERECTOMY;  Surgeon: Sherren Kerns, MD;  Location: [facility] OR;  Service: Vascular;  Laterality: Left;  . ENDARTERECTOMY FEMORAL Right 07/24/2016   Procedure: AORTA TO RIGHT FEMORAL ENDARTERECTOMY;  Surgeon: Sherren Kerns, MD;  Location: [facility] OR;  Service: Vascular;  Laterality: Right;  . RIGHT/LEFT HEART CATH AND CORONARY ANGIOGRAPHY N/A 05/14/2020   Procedure: RIGHT/LEFT HEART CATH AND CORONARY ANGIOGRAPHY;  Surgeon: Tonny Bollman, MD;  Location: [facility] INVASIVE CV LAB;  Service: Cardiovascular;  Laterality: N/A;  . TOE SURGERY Left    3 rd toe Hammer toe    Family History  Problem Relation Age of Onset  . Stroke Mother    Social History:  reports that she has been smoking cigarettes. She has a 30.00 pack-year smoking history. She has never used smokeless tobacco. She reports that she does not drink alcohol and does not use drugs.  Allergies:  Allergies  Allergen Reactions  . No Known Allergies     (Not in a hospital  admission)   Results for orders placed or performed during the hospital encounter of 05/20/20 (from the past 48 hour(s))  Type and screen [facility]     Status: None (Preliminary result)   Collection Time: 05/20/20 10:43 AM  Result Value Ref Range   ABO/RH(D) PENDING    Antibody Screen PENDING    Sample Expiration      05/23/2020,2359 Performed at [facility], [address] [place], [place] [address]   CBC with Differential     Status: Abnormal   Collection Time: 05/20/20 10:44 AM  Result Value Ref Range   WBC 9.4 4.0 - 10.5 K/uL   RBC 3.77 (L) 3.87 - 5.11 MIL/uL   Hemoglobin 12.0 12.0 - 15.0 g/dL   HCT 19.1 (L) 47.8 - 29.5 %   MCV 95.0 80.0 - 100.0 fL   MCH 31.8 26.0 - 34.0 pg   MCHC 33.5 30.0 - 36.0 g/dL   RDW 62.1 30.8 - 65.7 %   Platelets 151 150 - 400 K/uL   nRBC 0.0 0.0 - 0.2 %   Neutrophils Relative % 79 %   Neutro Abs 7.4 1.7 - 7.7 K/uL   Lymphocytes Relative 16 %   Lymphs Abs 1.5 0.7 - 4.0 K/uL   Monocytes Relative 4 %   Monocytes Absolute 0.4 0.1 - 1.0 K/uL   Eosinophils Relative 1 %   Eosinophils Absolute 0.1 0.0 - 0.5 K/uL   Basophils Relative 0 %  Basophils Absolute 0.0 0.0 - 0.1 K/uL   Immature Granulocytes 0 %   Abs Immature Granulocytes 0.03 0.00 - 0.07 K/uL    Comment: Performed at [facility], [address] [place], [place] [address]  Protime-INR     Status: None   Collection Time: 05/20/20 10:44 AM  Result Value Ref Range   Prothrombin Time 14.6 11.4 - 15.2 seconds   INR 1.2 0.8 - 1.2    Comment: (NOTE) INR goal varies based on device and disease states. Performed at [facility], [address] [place], [place] [address]   APTT     Status: None   Collection Time: 05/20/20 10:44 AM  Result Value Ref Range   aPTT 30 24 - 36 seconds    Comment: Performed at [facility], [address], [place], [place] [address]  I-stat chem 8, ED (not at [facility] or [facility])     Status: Abnormal   Collection Time:  05/20/20 10:55 AM  Result Value Ref Range   Sodium 135 135 - 145 mmol/L   Potassium 3.7 3.5 - 5.1 mmol/L   Chloride 100 98 - 111 mmol/L   BUN 21 8 - 23 mg/dL   Creatinine, Ser 4.85 0.44 - 1.00 mg/dL   Glucose, Bld 85 70 - 99 mg/dL    Comment: Glucose reference range applies only to samples taken after fasting for at least 8 hours.   Calcium, Ion 1.07 (L) 1.15 - 1.40 mmol/L   TCO2 26 22 - 32 mmol/L   Hemoglobin 11.9 (L) 12.0 - 15.0 g/dL   HCT 46.2 (L) 70.3 - 50.0 %   CT Angio Head W or Wo Contrast  Result Date: 05/20/2020 CLINICAL DATA:  Right-sided headache.  Rule out aneurysm. EXAM: CT ANGIOGRAPHY HEAD AND NECK TECHNIQUE: Multidetector CT imaging of the head and neck was performed using the standard protocol during bolus administration of intravenous contrast. Multiplanar CT image reconstructions and MIPs were obtained to evaluate the vascular anatomy. Carotid stenosis measurements (when applicable) are obtained utilizing NASCET criteria, using the distal internal carotid diameter as the denominator. CONTRAST:  60mL OMNIPAQUE IOHEXOL 350 MG/ML SOLN COMPARISON:  MRI head 06/03/2015 FINDINGS: CT HEAD FINDINGS Brain: Acute subarachnoid hemorrhage. There is a moderate amount of subarachnoid hemorrhage which is predominately right-sided extending into the sylvian fissure. There is blood in the suprasellar cistern and prepontine cistern also extending to the interhemispheric fissure anteriorly. Ventricle size normal. Small amount of hemorrhage accumulated in the occipital horns bilaterally. Mild patchy hypodensity in the cerebral white matter bilaterally consistent with chronic microvascular ischemia. No acute infarct or mass. Vascular: Obscured by subarachnoid hemorrhage. Skull: No acute skeletal abnormality. Total of 4 screws in the frontal bone bilaterally, 2 on each sign Sinuses: Mild mucosal edema paranasal sinuses. Orbits: Negative Review of the MIP images confirms the above findings CTA NECK  FINDINGS Aortic arch: Atherosclerotic calcification in the aortic arch without aneurysm. Bovine branching pattern. Mild stenosis proximal left common carotid artery. Atherosclerotic calcification throughout the proximal great vessels. Right carotid system: Mild atherosclerotic calcification right common carotid artery. Atherosclerotic calcification right carotid bifurcation. 30% diameter stenosis proximal right internal carotid artery. Mild stenosis origin of right external carotid artery. Left carotid system: Atherosclerotic calcification left common carotid artery with mild stenosis at the origin. Atherosclerotic calcification left carotid bifurcation without significant stenosis. Vertebral arteries: Left vertebral dominant. Both vertebral arteries patent to the basilar without stenosis or aneurysm. Skeleton: Degenerative changes throughout the cervical spine with  disc and facet degeneration. No acute skeletal abnormality. Other neck: Negative for mass or adenopathy. Upper chest: Extensive apical emphysema bilaterally. Apical scarring bilaterally. Review of the MIP images confirms the above findings CTA HEAD FINDINGS Anterior circulation: Atherosclerotic calcification in the cavernous carotid bilaterally without stenosis. 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 No extravasation. Small right posterior communicating artery is patent. Posterior circulation: Both vertebral arteries patent to the basilar. PICA patent bilaterally. Basilar patent. Superior cerebellar and posterior cerebral arteries patent bilaterally. Small posterior communicating artery bilaterally. Venous sinuses: Normal venous enhancement Anatomic variants: None Review of the MIP images confirms the above findings IMPRESSION: 1. Acute subarachnoid hemorrhage predominately on the right side. Mild amount of blood in the ventricles without hydrocephalus.  2. 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 No other aneurysm 3. Moderate atherosclerotic disease involving the aortic arch and carotid artery bilaterally. No flow limiting stenosis in the carotid or vertebral artery. 4. These results were called by telephone at the time of interpretation on 05/20/2020 at 10:52 am to provider [facility] , who verbally acknowledged these results. Electronically Signed   By: Marlan Palauharles  Clark M.D.   On: 05/20/2020 10:53   CT Angio Neck W and/or Wo Contrast  Result Date: 05/20/2020 CLINICAL DATA:  Right-sided headache.  Rule out aneurysm. EXAM: CT ANGIOGRAPHY HEAD AND NECK TECHNIQUE: Multidetector CT imaging of the head and neck was performed using the standard protocol during bolus administration of intravenous contrast. Multiplanar CT image reconstructions and MIPs were obtained to evaluate the vascular anatomy. Carotid stenosis measurements (when applicable) are obtained utilizing NASCET criteria, using the distal internal carotid diameter as the denominator. CONTRAST:  75mL OMNIPAQUE IOHEXOL 350 MG/ML SOLN COMPARISON:  MRI head 06/03/2015 FINDINGS: CT HEAD FINDINGS Brain: Acute subarachnoid hemorrhage. There is a moderate amount of subarachnoid hemorrhage which is predominately right-sided extending into the sylvian fissure. There is blood in the suprasellar cistern and prepontine cistern also extending to the interhemispheric fissure anteriorly. Ventricle size normal. Small amount of hemorrhage accumulated in the occipital horns bilaterally. Mild patchy hypodensity in the cerebral white matter bilaterally consistent with chronic microvascular ischemia. No acute infarct or mass. Vascular: Obscured by subarachnoid hemorrhage. Skull: No acute skeletal abnormality. Total of 4 screws in the frontal bone bilaterally, 2 on each sign Sinuses: Mild mucosal edema paranasal sinuses. Orbits: Negative Review of the MIP images confirms the  above findings CTA NECK FINDINGS Aortic arch: Atherosclerotic calcification in the aortic arch without aneurysm. Bovine branching pattern. Mild stenosis proximal left common carotid artery. Atherosclerotic calcification throughout the proximal great vessels. Right carotid system: Mild atherosclerotic calcification right common carotid artery. Atherosclerotic calcification right carotid bifurcation. 30% diameter stenosis proximal right internal carotid artery. Mild stenosis origin of right external carotid artery. Left carotid system: Atherosclerotic calcification left common carotid artery with mild stenosis at the origin. Atherosclerotic calcification left carotid bifurcation without significant stenosis. Vertebral arteries: Left vertebral dominant. Both vertebral arteries patent to the basilar without stenosis or aneurysm. Skeleton: Degenerative changes throughout the cervical spine with disc and facet degeneration. No acute skeletal abnormality. Other neck: Negative for mass or adenopathy. Upper chest: Extensive apical emphysema bilaterally. Apical scarring bilaterally. Review of the MIP images confirms the above findings CTA HEAD FINDINGS Anterior circulation: Atherosclerotic calcification in the cavernous carotid bilaterally without stenosis. Anterior and middle cerebral arteries patent bilaterally without stenosis Right posterior communicating artery aneurysm measuring 6 x  8 mm. No extravasation. Small right posterior communicating artery is patent. Posterior circulation: Both vertebral arteries patent to the basilar. PICA patent bilaterally. Basilar patent. Superior cerebellar and posterior cerebral arteries patent bilaterally. Small posterior communicating artery bilaterally. Venous sinuses: Normal venous enhancement Anatomic variants: None Review of the MIP images confirms the above findings IMPRESSION: 1. Acute subarachnoid hemorrhage predominately on the right side. Mild amount of blood in the ventricles  without hydrocephalus. 2. 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 No other aneurysm 3. Moderate atherosclerotic disease involving the aortic arch and carotid artery bilaterally. No flow limiting stenosis in the carotid or vertebral artery. 4. These results were called by telephone at the time of interpretation on 05/20/2020 at 10:52 am to provider [facility] , who verbally acknowledged these results. Electronically Signed   By: Marlan Palau M.D.   On: 05/20/2020 10:53    Review of Systems  Constitutional: Positive for fatigue.  HENT: Positive for ear pain.   Eyes: Positive for pain.  Respiratory: Positive for shortness of breath.   Cardiovascular: Positive for chest pain.  Gastrointestinal: Negative.   Endocrine: Negative.   Genitourinary: Negative.   Musculoskeletal: Positive for neck pain.  Allergic/Immunologic: Negative.   Neurological: Positive for headaches.  Hematological: Bruises/bleeds easily.  Psychiatric/Behavioral: The patient is nervous/anxious.     Blood pressure (!) 127/59, pulse (!) 59, temperature 97.9 F (36.6 C), temperature source Oral, resp. rate (!) 9, height 5\' 4"  (1.626 m), weight 37.8 kg, SpO2 90 %. Physical Exam Constitutional:      Appearance: She is ill-appearing.  HENT:     Head: Normocephalic and atraumatic.     Mouth/Throat:     Mouth: Mucous membranes are moist.     Pharynx: Oropharynx is clear.  Eyes:     General: No visual field deficit.    Extraocular Movements: Extraocular movements intact.     Pupils: Pupils are equal, round, and reactive to light.  Neck:     Meningeal: Kernig's sign present.  Cardiovascular:     Rate and Rhythm: Normal rate and regular rhythm.  Pulmonary:     Effort: Pulmonary effort is normal.     Breath sounds: Normal breath sounds.  Abdominal:     Palpations: Abdomen is soft.  Musculoskeletal:        General: Normal range of motion.     Cervical back: Rigidity  present.  Skin:    General: Skin is warm and dry.  Neurological:     General: No focal deficit present.     Mental Status: She is alert and oriented to person, place, and time.     Cranial Nerves: No cranial nerve deficit, dysarthria or facial asymmetry.     Sensory: Sensation is intact.     Motor: No weakness.     Coordination: Coordination is intact.     Deep Tendon Reflexes: Babinski sign absent on the right side. Babinski sign absent on the left side.     Comments: Did not assess romberg Very uncomfortable  Psychiatric:        Mood and Affect: Mood normal.        Speech: Speech normal.        Behavior: Behavior normal.      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 Will proceed with diagnostic angiogram anticipating definitive aneurysm treatment. Risks of rebleeding, vasospasm and other risks were discussed. She understands and will proceed as advised.   ,  MD 05/20/2020, 11:57 AM

## 2020-05-20 NOTE — Progress Notes (Signed)
  NEUROSURGERY PROGRESS NOTE   Pt seen and examined. History reviewed with pt, daughter, and EMR. Had sudden onset of neck pain, and subsequent severe right-sided HA and retro-orbital pain. No associated visual changes, no N/T/W. Pt recently discharged from hospital with NSTEMI. Per pts daughter, has not taken her ASA since discharge this past Sunday (4 days ago).  PMHx significant for recent NSTEMI, COPD, HTN, and AAA with femoral endarterectomy (2018).  (+) tobacco smoking history  FHx: stroke, no known intracranial aneurysms  EXAM:  BP (!) 118/58   Pulse (!) 49   Temp 97.9 F (36.6 C) (Oral)   Resp 12   Ht 5\' 4"  (1.626 m)   Wt 37.8 kg   SpO2 95%   BMI 14.30 kg/m   Awake, alert, oriented  Speech fluent, appropriate  CN grossly intact  5/5 BUE/BLE   IMAGING: CT head reviewed demonstrating large volume basal/pos fossa SAH slightly eccentric to right. No HCP. CTA demonstrates ~87mm right Pcom aneurysm. No other aneurysms seen.  IMPRESSION:  70 y.o. 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 We discussed possible treatment options for intracranial aneurysms including endovascular coiling and open clip ligation. The risks of the angiogram, coiling, and surgical clipping were also reviewed to include stroke and aneurysm re-rupture leading to weakness/paralysis/coma/death, infection, SZ, hydrocephalus. I also talked to them about the possibility of ventriculostomy for CSF drainage and the risks of this procedure.  The patient and her daughter appeared to understand our discussion and provided consent to proceed with diagnostic angiogram and the appropriate treatment for any identified aneurysm.   60, MD [facility]

## 2020-05-20 NOTE — Anesthesia Procedure Notes (Signed)
Arterial Line Insertion Start/End3/12/2020 12:45 PM, 05/20/2020 1:00 PM Performed by: Leonides Grills, MD, anesthesiologist  Patient location: Pre-op. Preanesthetic checklist: patient identified, IV checked, site marked, risks and benefits discussed, surgical consent, monitors and equipment checked, pre-op evaluation, timeout performed and anesthesia consent Lidocaine 1% used for infiltration Left, radial was placed Catheter size: 20 Fr Hand hygiene performed , maximum sterile barriers used  and Seldinger technique used  Attempts: 1 Procedure performed using ultrasound guided technique. Ultrasound Notes:anatomy identified, needle tip was noted to be adjacent to the nerve/plexus identified and no ultrasound evidence of intravascular and/or intraneural injection Following insertion, dressing applied and Biopatch. Post procedure assessment: normal and unchanged  Patient tolerated the procedure well with no immediate complications.

## 2020-05-20 NOTE — Anesthesia Procedure Notes (Signed)
Procedure Name: Intubation Date/Time: 05/20/2020 1:35 PM Performed by: Lelon Perla, CRNA Pre-anesthesia Checklist: Patient identified, Emergency Drugs available, Suction available and Patient being monitored Patient Re-evaluated:Patient Re-evaluated prior to induction Oxygen Delivery Method: Circle System Utilized Preoxygenation: Pre-oxygenation with 100% oxygen Induction Type: IV induction Ventilation: Mask ventilation without difficulty Laryngoscope Size: Miller Grade View: Grade I Tube type: Oral Tube size: 7.0 mm Number of attempts: 1 Airway Equipment and Method: Stylet and Oral airway Placement Confirmation: ETT inserted through vocal cords under direct vision,  positive ETCO2 and breath sounds checked- equal and bilateral Secured at: 21 cm Tube secured with: Tape Dental Injury: Teeth and Oropharynx as per pre-operative assessment

## 2020-05-20 NOTE — Transfer of Care (Signed)
Immediate Anesthesia Transfer of Care Note  Patient: Alexandra Mays  Procedure(s) Performed: IR WITH ANESTHESIA - ANEURYSM (N/A )  Patient Location: PACU  Anesthesia Type:General  Level of Consciousness: drowsy and patient cooperative  Airway & Oxygen Therapy: Patient Spontanous Breathing and Patient connected to face mask oxygen  Post-op Assessment: Report given to RN and Post -op Vital signs reviewed and stable  Post vital signs: Reviewed and stable  Last Vitals:  Vitals Value Taken Time  BP 119/64 05/20/20 1549  Temp    Pulse 66 05/20/20 1554  Resp 13 05/20/20 1554  SpO2 100 % 05/20/20 1554  Vitals shown include unvalidated device data.  Last Pain:  Vitals:   05/20/20 0920  TempSrc:   PainSc: 10-Worst pain ever         Complications: No complications documented.

## 2020-05-20 NOTE — ED Notes (Signed)
Placed pt on purewick  

## 2020-05-21 ENCOUNTER — Inpatient Hospital Stay (HOSPITAL_COMMUNITY): Payer: Medicare Other

## 2020-05-21 ENCOUNTER — Encounter (HOSPITAL_COMMUNITY): Payer: Self-pay | Admitting: Neurosurgery

## 2020-05-21 DIAGNOSIS — I5022 Chronic systolic (congestive) heart failure: Secondary | ICD-10-CM

## 2020-05-21 DIAGNOSIS — I608 Other nontraumatic subarachnoid hemorrhage: Secondary | ICD-10-CM

## 2020-05-21 DIAGNOSIS — J441 Chronic obstructive pulmonary disease with (acute) exacerbation: Secondary | ICD-10-CM | POA: Diagnosis not present

## 2020-05-21 DIAGNOSIS — I609 Nontraumatic subarachnoid hemorrhage, unspecified: Secondary | ICD-10-CM

## 2020-05-21 IMAGING — CT CT HEAD W/O CM
3 series · 14 of 47 positions shown, 16 images · non-contrast
Comparison: [DATE]

CLINICAL DATA: Altered level of consciousness, history of
subarachnoid hemorrhage from ruptured right posterior communicating
artery aneurysm, status post aneurysm embolization

EXAM:
CT HEAD WITHOUT CONTRAST
TECHNIQUE: Contiguous axial images were obtained from the base of the skull
through the vertex without intravenous contrast.

[Series 4: head 3.0 mpr cor · coronal · 0.34mm/px · 3 of 75 slices shown]
[im 25/75  brain]
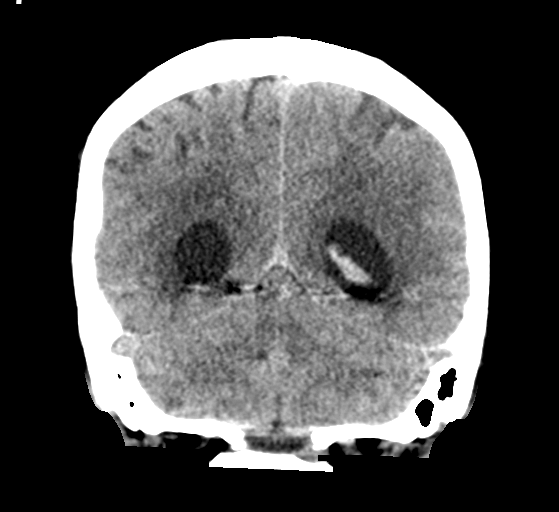
[im 33/75  brain]
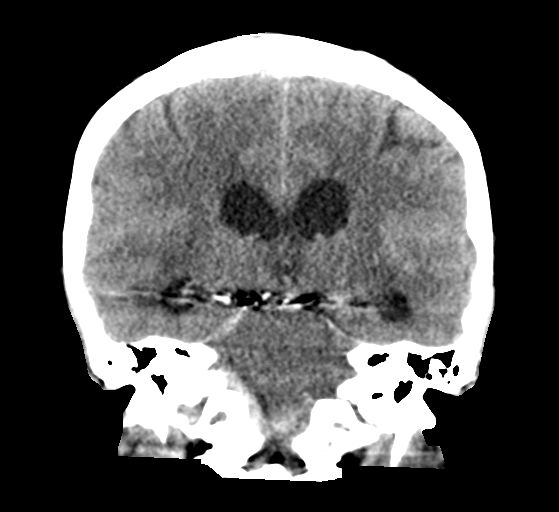
[im 42/75  brain]
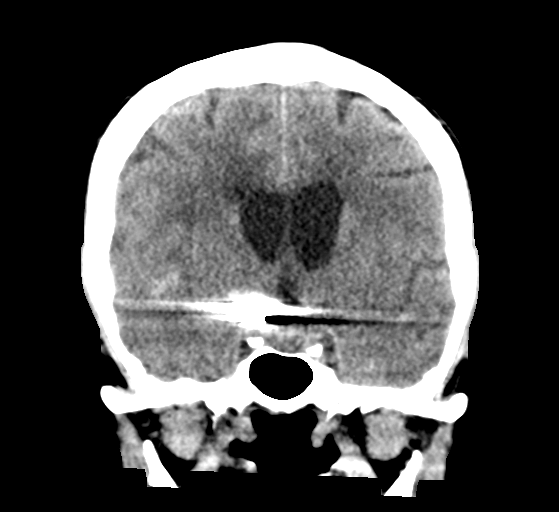

[Series 5: head 5.0 h30s · axial · 0.44mm/px · z∈[-114,+16]mm · 8 of 32 slices shown, 10 images]
[im 3/32  brain]
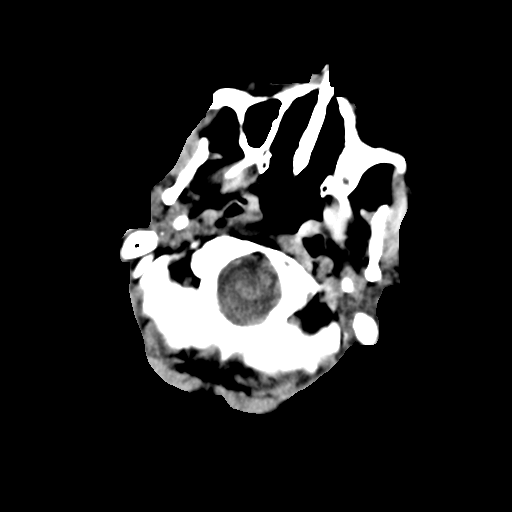
[im 3/32  bone]
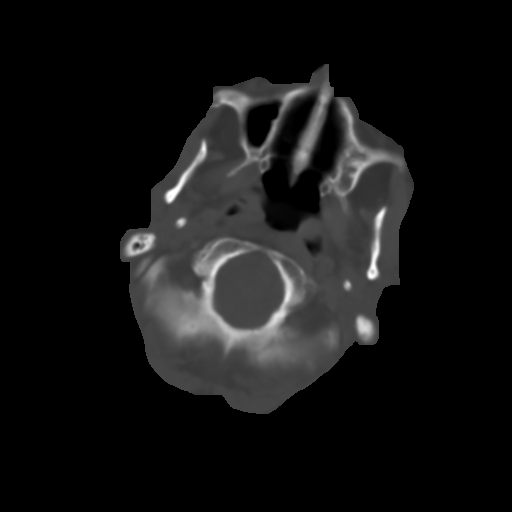
[im 7/32  brain]
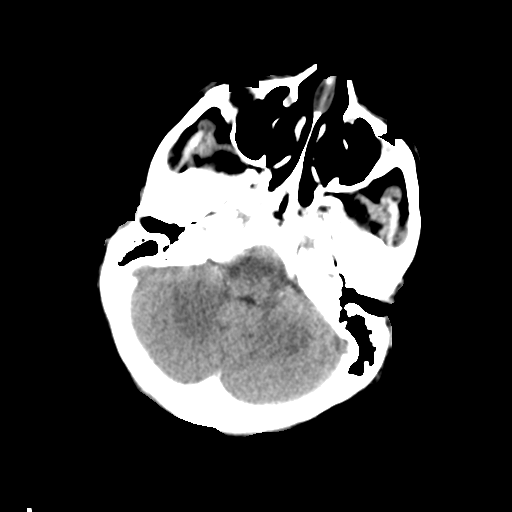
[im 10/32  brain]
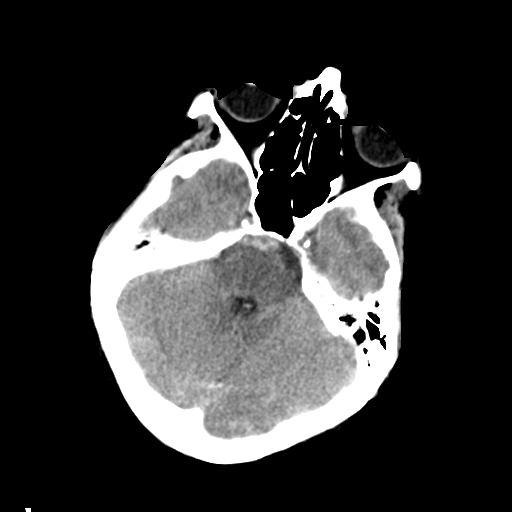
[im 14/32  brain]
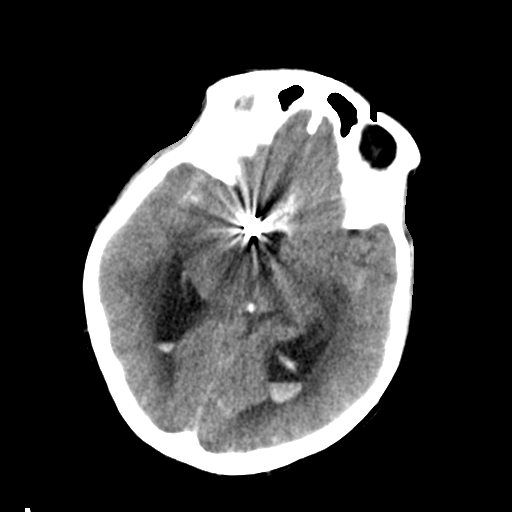
[im 18/32  brain]
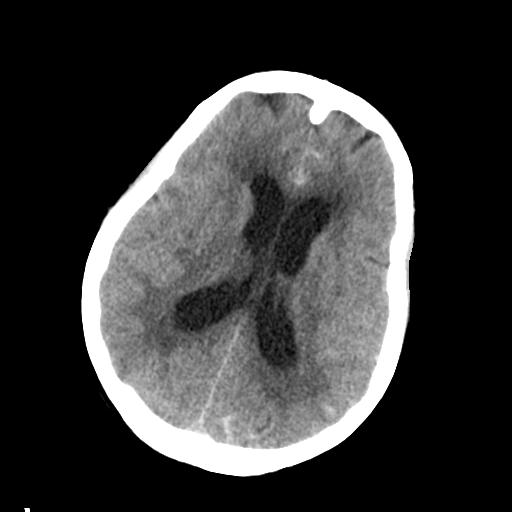
[im 18/32  bone]
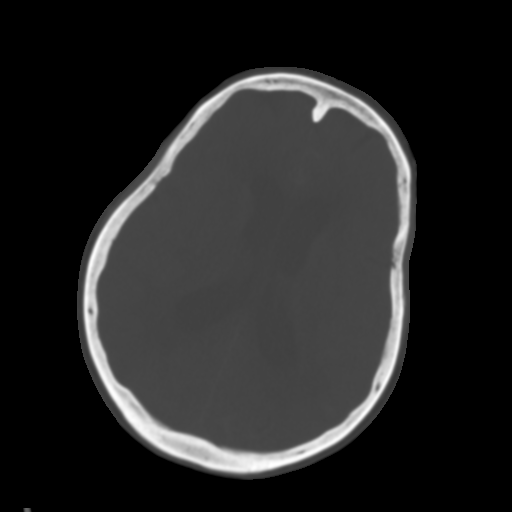
[im 22/32  brain]
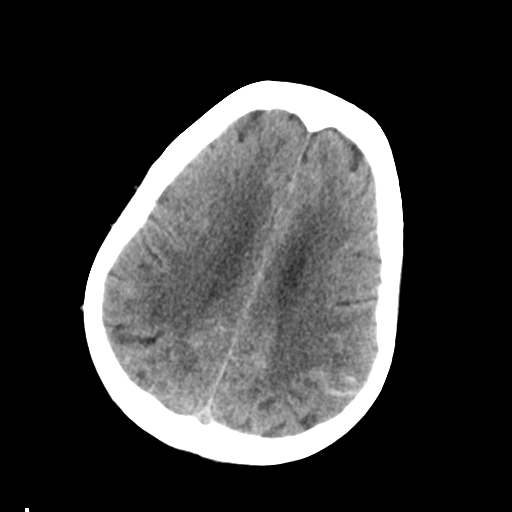
[im 25/32  brain]
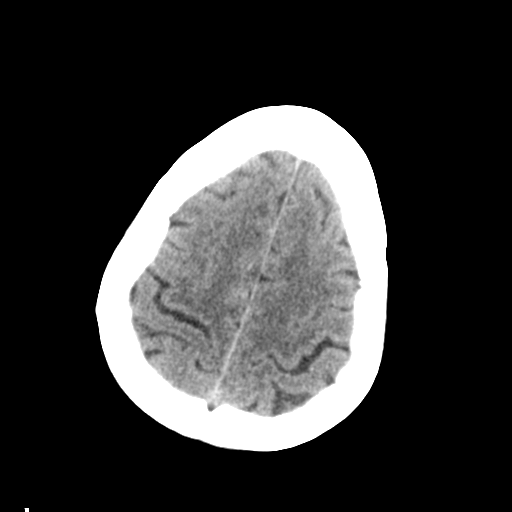
[im 29/32  brain]
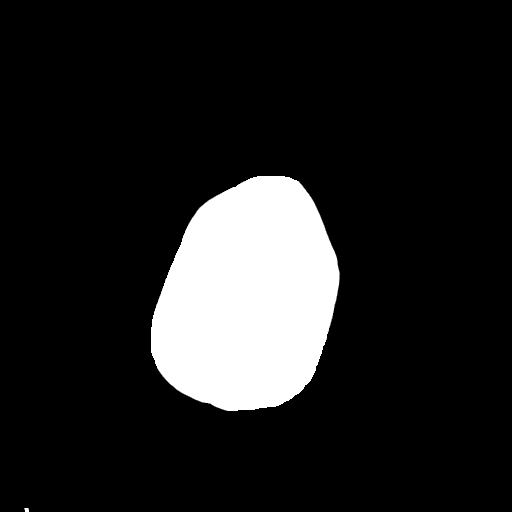

[Series 6: head 3.0 mpr sag · sagittal · 0.33mm/px · 3 of 63 slices shown]
[im 21/63  brain]
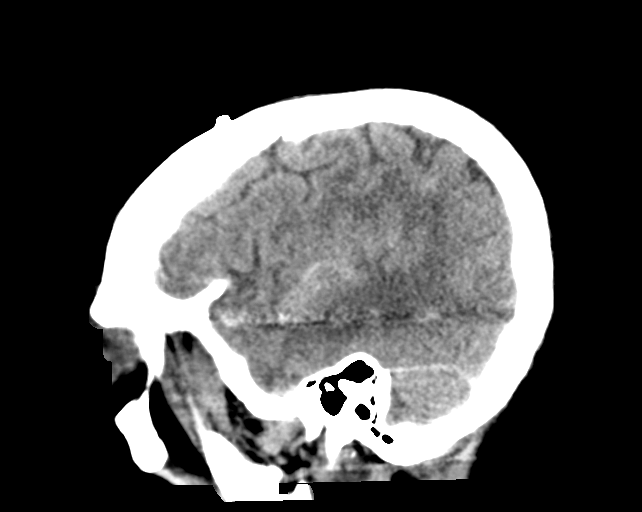
[im 32/63  brain]
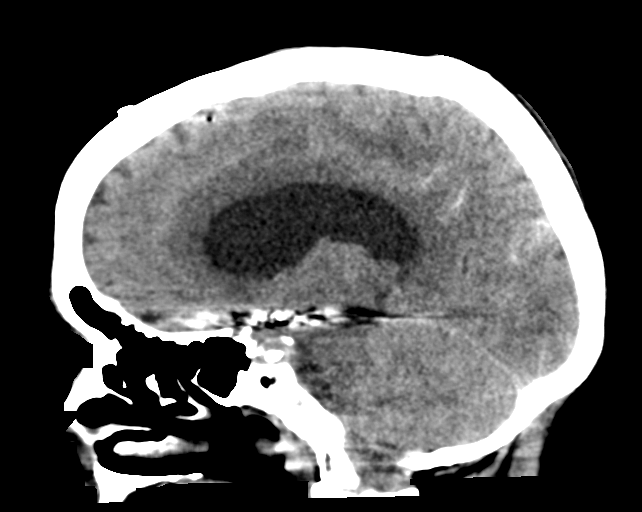
[im 42/63  brain]
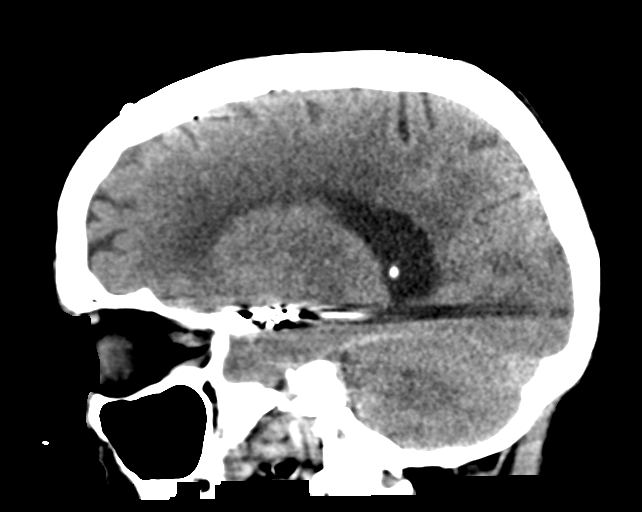

[14 of 47 positions shown; findings below may reference images not displayed]

FINDINGS: Brain: Aneurysm coil within the region of the right posterior
communicating artery, with significant streak artifact. The areas of
subarachnoid hemorrhage seen in the suprasellar region on the
preoperative evaluation are again noted.

Intraventricular hemorrhage layering dependently within the
occipital horns of the lateral ventricles is new since
postprocedural CT. Decreased intraventricular hemorrhage within the
third ventricle. Mild hydrocephalus, with increased prominence of
the lateral and third ventricles since prior studies.

Likely small developing cortical infarcts within the right insula
and frontotemporal regions. Extensive hypodensities throughout the
periventricular and subcortical white matter likely reflect an
element of transependymal CSF flow superimposed upon chronic small
vessel ischemic change.

High attenuation material seen throughout the bilateral sulci may
reflect a combination of subarachnoid hemorrhage and contrast
staining after interventional procedure. No extra-axial fluid
collection.

Vascular: Post therapeutic changes from right posterior
communicating artery aneurysm embolization. Mild atherosclerosis
within the internal carotid arteries.

Skull: Negative for fracture or focal lesion. Screws are again noted
within the bilateral frontal bones.

Sinuses/Orbits: Minimal mucosal thickening within the left sphenoid
sinus. Remaining sinuses are clear.

Other: None.
IMPRESSION: 1. Diffuse areas of high attenuation throughout the bilateral sulci,
likely a combination of residual subarachnoid hemorrhage and
postprocedural contrast staining.
2. Persistent subarachnoid hemorrhage in the suprasellar region,
with intraventricular hemorrhage in the bilateral lateral ventricles
and third ventricles as above.
3. Mild hydrocephalus which has developed in the interim.
4. Developing hypodensities within the right insula and
frontotemporal regions, likely representing acute cortical infarct.
5. Diffuse hypodensities throughout the periventricular white
matter, likely representing an element of transependymal CSF flow
and chronic small vessel ischemic change.

## 2020-05-21 MED ORDER — NIMODIPINE 30 MG PO CAPS: 60.0000 mg | ORAL_CAPSULE | ORAL | Status: DC

## 2020-05-21 MED ORDER — NIMODIPINE 6 MG/ML PO SOLN
60.0000 mg | ORAL | Status: DC
  Administered 2020-05-21 – 2020-05-23 (×8): 60 mg via ORAL
  Filled 2020-05-21 (×9): qty 10

## 2020-05-21 MED ORDER — WHITE PETROLATUM EX OINT
TOPICAL_OINTMENT | CUTANEOUS | Status: AC
  Filled 2020-05-21: qty 28.35

## 2020-05-21 NOTE — Progress Notes (Addendum)
eLink Physician-Brief Progress Note Patient Name: Alexandra Mays DOB: 1950-10-10 MRN: [id number]   Date of Service  05/21/2020  HPI/Events of Note  Patient with a recent ICH who is less alert and oriented, she is unable to take her oral Nimodipine.  eICU Interventions  Stat head CT r/o extension of hemorrhage vs increase in ICP, NPO for now, after CT reviewed a decision will be made regarding NG tube placement for Nimodipine administration access. Addendum Neurosurgery reviewed the images, and ordered an NG tube placed for Nimodipine access.        Thomasene Lot Ogan 05/21/2020, 8:59 PM

## 2020-05-21 NOTE — Progress Notes (Signed)
TCD study completed.   Please see CV Proc for preliminary results.   Fabienne Nolasco, RDMS, RVT  

## 2020-05-21 NOTE — Consult Note (Signed)
NAME:  Alexandra Mays, MRN:  [id number], DOB:  [birth year], LOS: 1 ADMISSION DATE:  05/20/2020, CONSULTATION DATE: 05/21/2020 REFERRING MD: Dr. Conchita Paris, CHIEF COMPLAINT: Headache and neck pain  Brief History:  70 year old female with COPD and recent NSTEMI who presented with sudden onset of headache and neck pain for few days, noted to have subarachnoid hemorrhage H/H 2, MF 3 due to ruptured right P-comm aneurysm, status post coiling  History of Present Illness:  70 year old female with history of AAA, COPD and recent NSTEMI who presented to the ED on 3/10 with a complaint of worsening headache and neck pain for 2 days duration, CT head showed subarachnoid hemorrhage, CT angiogram of head and neck confirmed a ruptured right P-comm aneurysm, she underwent angiogram and coiling of right P-comm aneurysm.  PCCM was consulted for management of her medical conditions considering recent NSTEMI. Currently patient is confused, forgetful, complaining of neck pain.  Past Medical History:   Past Medical History:  Diagnosis Date  . AAA (abdominal aortic aneurysm) (HCC)    AAA  . COPD (chronic obstructive pulmonary disease) (HCC)   . Dyspnea    with exertion  . Pure hyperglyceridemia      Significant Hospital Events:    Consults:  Neurosurgery PCCM  Procedures:  3/10 angiogram with coiling of right P-comm aneurysm  Significant Diagnostic Tests:  3/10 CT head: Subarachnoid hemorrhage 3/10 CT angiogram of head and neck: Ruptured right P-comm aneurysm  Micro Data:  3/10 MRSA PCR negative 3/10 Covid and influenza PCR negative  Antimicrobials:     Interim History / Subjective:    Objective   Blood pressure 138/73, pulse 67, temperature 98.2 F (36.8 C), temperature source Oral, resp. rate 14, height 5\' 4"  (1.626 m), weight 37.8 kg, SpO2 98 %.        Intake/Output Summary (Last 24 hours) at 05/21/2020 1125 Last data filed at 05/21/2020 1000 Gross per 24 hour  Intake 2627.27 ml   Output 820 ml  Net 1807.27 ml   Filed Weights   05/20/20 0920  Weight: 37.8 kg    Examination:  General: Chronically ill-appearing elderly female, lying on the bed HEENT: [place]/AT, eyes anicteric.    Dry mucous membranes, no JVD Neuro: Lethargic, opens eyes with vocal stimuli, forgetful, antigravity in all 4 extremities.  She is not cooperating with cranial nerve exam Chest: Coarse breath sounds, no wheezes or rhonchi Heart: Regular rate and rhythm, no murmurs or gallops Abdomen: Soft, nontender, nondistended, bowel sounds present Skin: No rash  Resolved Hospital Problem list     Assessment & Plan:  Aneurysmal subarachnoid hemorrhage H/H 2, MF 3 due to ruptured P-comm aneurysm status post coiling Continue neuro check every hour Maintain normal natremia and euvolemia TCD's Neurosurgery input is appreciated Continue monitor pain  Recent NSTEMI Holding aspirin  Chronic systolic congestive heart failure with EF 20 to 25% Continue Coreg and losartan Monitor intake and output  COPD, not in exacerbation Continue nebs  Dementia Continue supportive care  Best practice (evaluated daily)  Diet: Dysphagia level 1 Pain/Anxiety/Delirium protocol (if indicated): N/A VAP protocol (if indicated): N/A DVT prophylaxis: SCDs GI prophylaxis: Famotidine Glucose control: SSI Mobility: As tolerated Disposition: Full code  Goals of Care:  Last date of multidisciplinary goals of care discussion: Per primary team Code Status: Full code  Labs   CBC: Recent Labs  Lab 05/14/20 1347 05/14/20 1354 05/16/20 0230 05/20/20 1044 05/20/20 1055  WBC  --   --  9.1 9.4  --  NEUTROABS  --   --   --  7.4  --   HGB 12.6 12.2 11.4* 12.0 11.9*  HCT 37.0 36.0 33.6* 35.8* 35.0*  MCV  --   --  93.6 95.0  --   PLT  --   --  143* 151  --     Basic Metabolic Panel: Recent Labs  Lab 05/14/20 1354 05/15/20 0352 05/16/20 0230 05/20/20 1044 05/20/20 1055  NA 143 134* 135 132* 135  K 3.5  4.2 3.9 3.7 3.7  CL  --  107 104 102 100  CO2  --  19* 22 25  --   GLUCOSE  --  139* 92 93 85  BUN  --  26* 25* 17 21  CREATININE  --  0.80 0.82 0.83 0.70  CALCIUM  --  8.7* 8.9 8.4*  --    GFR: Estimated Creatinine Clearance: 38.5 mL/min (by C-G formula based on SCr of 0.7 mg/dL). Recent Labs  Lab 05/16/20 0230 05/20/20 1044  WBC 9.1 9.4    Liver Function Tests: Recent Labs  Lab 05/20/20 1044  AST 26  ALT 17  ALKPHOS 57  BILITOT 0.5  PROT 5.5*  ALBUMIN 3.0*   No results for input(s): LIPASE, AMYLASE in the last 168 hours. No results for input(s): AMMONIA in the last 168 hours.  ABG    Component Value Date/Time   PHART 7.412 05/14/2020 1347   PCO2ART 36.8 05/14/2020 1347   PO2ART 82 (L) 05/14/2020 1347   HCO3 23.7 05/14/2020 1354   TCO2 26 05/20/2020 1055   ACIDBASEDEF 1.0 05/14/2020 1354   O2SAT 67.0 05/14/2020 1354     Coagulation Profile: Recent Labs  Lab 05/20/20 1044 05/20/20 1836  INR 1.2 1.2    Cardiac Enzymes: No results for input(s): CKTOTAL, CKMB, CKMBINDEX, TROPONINI in the last 168 hours.  HbA1C: Hgb A1c MFr Bld  Date/Time Value Ref Range Status  05/14/2020 02:40 AM 5.8 (H) 4.8 - 5.6 % Final    Comment:    (NOTE) Pre diabetes:          5.7%-6.4%  Diabetes:              >6.4%  Glycemic control for   <7.0% adults with diabetes     CBG: No results for input(s): GLUCAP in the last 168 hours.  Review of Systems:   12 point review of systems significant for complaint mentioned in HPI, rest is negative  Past Medical History:  She,  has a past medical history of AAA (abdominal aortic aneurysm) (HCC), COPD (chronic obstructive pulmonary disease) (HCC), Dyspnea, and Pure hyperglyceridemia.   Surgical History:   Past Surgical History:  Procedure Laterality Date  . ABDOMINAL AORTIC ANEURYSM REPAIR N/A 07/24/2016   Procedure: ABDOMINAL AORTIC ANEURYSM  REPAIR;  Surgeon: Sherren Kerns, MD;  Location: [facility] OR;  Service: Vascular;   Laterality: N/A;  . cardiac stenting  2007  . ENDARTERECTOMY Left 07/24/2016   Procedure: AORTA TO LEFT ILIAC ENDARTERECTOMY;  Surgeon: Sherren Kerns, MD;  Location: [facility] OR;  Service: Vascular;  Laterality: Left;  . ENDARTERECTOMY FEMORAL Right 07/24/2016   Procedure: AORTA TO RIGHT FEMORAL ENDARTERECTOMY;  Surgeon: Sherren Kerns, MD;  Location: [facility] OR;  Service: Vascular;  Laterality: Right;  . IR ANGIO INTRA EXTRACRAN SEL INTERNAL CAROTID BILAT MOD SED  05/20/2020  . IR ANGIO VERTEBRAL SEL VERTEBRAL UNI R MOD SED  05/20/2020  . IR ANGIOGRAM FOLLOW UP STUDY  05/20/2020  . IR ANGIOGRAM FOLLOW  UP STUDY  05/20/2020  . IR CT HEAD LTD  05/20/2020  . IR TRANSCATH/EMBOLIZ  05/20/2020  . RADIOLOGY WITH ANESTHESIA N/A 05/20/2020   Procedure: IR WITH ANESTHESIA - ANEURYSM;  Surgeon: Lisbeth Renshaw, MD;  Location: [facility] OR;  Service: Radiology;  Laterality: N/A;  . RIGHT/LEFT HEART CATH AND CORONARY ANGIOGRAPHY N/A 05/14/2020   Procedure: RIGHT/LEFT HEART CATH AND CORONARY ANGIOGRAPHY;  Surgeon: Tonny Bollman, MD;  Location: [facility] INVASIVE CV LAB;  Service: Cardiovascular;  Laterality: N/A;  . TOE SURGERY Left    3 rd toe Hammer toe     Social History:   reports that she has been smoking cigarettes. She has a 30.00 pack-year smoking history. She has never used smokeless tobacco. She reports that she does not drink alcohol and does not use drugs.   Family History:  Her family history includes Stroke in her mother.   Allergies Allergies  Allergen Reactions  . No Known Allergies      Home Medications  Prior to Admission medications   Medication Sig Start Date End Date Taking? Authorizing Provider  albuterol (PROVENTIL HFA;VENTOLIN HFA) 108 (90 Base) MCG/ACT inhaler Inhale 2 puffs into the lungs every 6 (six) hours as needed for wheezing or shortness of breath.   Yes [provider]  albuterol (PROVENTIL) (2.5 MG/3ML) 0.083% nebulizer solution Take 3 mLs (2.5 mg total) by nebulization every  6 (six) hours as needed for wheezing or shortness of breath. 11/21/19  Yes Hunsucker, Lesia Sago, MD  BREO ELLIPTA 100-25 MCG/INH AEPB Inhale 1 puff into the lungs daily. 04/16/20  Yes [provider]  carvedilol (COREG) 3.125 MG tablet Take 1 tablet (3.125 mg total) by mouth 2 (two) times daily with a meal. 05/16/20  Yes Zannie Cove, MD  ibuprofen (ADVIL,MOTRIN) 200 MG tablet Take 800 mg by mouth every 6 (six) hours as needed for mild pain.   Yes [provider]  losartan (COZAAR) 25 MG tablet Take 0.5 tablets (12.5 mg total) by mouth daily. 05/16/20  Yes Zannie Cove, MD  Multiple Vitamin (MULTIVITAMIN WITH MINERALS) TABS tablet Take 1 tablet by mouth daily.   Yes [provider]  simvastatin (ZOCOR) 20 MG tablet Take 20 mg by mouth every morning.    Yes [provider]  Tiotropium Bromide Monohydrate 2.5 MCG/ACT AERS Inhale 2 puffs into the lungs daily.   Yes [provider]  polyethylene glycol (MIRALAX / GLYCOLAX) packet Take 17 g by mouth daily. Patient not taking: No sig reported 06/23/16   Forest Becker, MD     Total critical care time: 37 minutes  Performed by: Cheri Fowler   Critical care time was exclusive of separately billable procedures and treating other patients.   Critical care was necessary to treat or prevent imminent or life-threatening deterioration.   Critical care was time spent personally by me on the following activities: development of treatment plan with patient and/or surrogate as well as nursing, discussions with consultants, evaluation of patient's response to treatment, examination of patient, obtaining history from patient or surrogate, ordering and performing treatments and interventions, ordering and review of laboratory studies, ordering and review of radiographic studies, pulse oximetry and re-evaluation of patient's condition.   Cheri Fowler MD [facility] See Amion for pager If no response  to pager, please call [phone] until 7pm After 7pm, Please call E-link [phone]

## 2020-05-21 NOTE — Care Management (Signed)
Transitions of Care Team following this patient for discharge planning.  Currently, patient not medically stable; will continue to follow as patient progresses.    Godric Lavell W. Bain Whichard, RN, BSN  Trauma/Neuro ICU Case Manager [phone] 

## 2020-05-21 NOTE — Evaluation (Signed)
Clinical/Bedside Swallow Evaluation Patient Details  Name: Alexandra Mays MRN: [id number] Date of Birth: [birth year]  Today's Date: 05/21/2020 Time: SLP Start Time (ACUTE ONLY): 4854 SLP Stop Time (ACUTE ONLY): 0920 SLP Time Calculation (min) (ACUTE ONLY): 15 min  Past Medical History:  Past Medical History:  Diagnosis Date  . AAA (abdominal aortic aneurysm) (HCC)    AAA  . COPD (chronic obstructive pulmonary disease) (HCC)   . Dyspnea    with exertion  . Pure hyperglyceridemia    Past Surgical History:  Past Surgical History:  Procedure Laterality Date  . ABDOMINAL AORTIC ANEURYSM REPAIR N/A 07/24/2016   Procedure: ABDOMINAL AORTIC ANEURYSM  REPAIR;  Surgeon: Sherren Kerns, MD;  Location: [facility] OR;  Service: Vascular;  Laterality: N/A;  . cardiac stenting  2007  . ENDARTERECTOMY Left 07/24/2016   Procedure: AORTA TO LEFT ILIAC ENDARTERECTOMY;  Surgeon: Sherren Kerns, MD;  Location: [facility] OR;  Service: Vascular;  Laterality: Left;  . ENDARTERECTOMY FEMORAL Right 07/24/2016   Procedure: AORTA TO RIGHT FEMORAL ENDARTERECTOMY;  Surgeon: Sherren Kerns, MD;  Location: [facility] OR;  Service: Vascular;  Laterality: Right;  . IR ANGIO INTRA EXTRACRAN SEL INTERNAL CAROTID BILAT MOD SED  05/20/2020  . IR ANGIO VERTEBRAL SEL VERTEBRAL UNI R MOD SED  05/20/2020  . IR ANGIOGRAM FOLLOW UP STUDY  05/20/2020  . IR ANGIOGRAM FOLLOW UP STUDY  05/20/2020  . IR CT HEAD LTD  05/20/2020  . IR TRANSCATH/EMBOLIZ  05/20/2020  . RADIOLOGY WITH ANESTHESIA N/A 05/20/2020   Procedure: IR WITH ANESTHESIA - ANEURYSM;  Surgeon: Lisbeth Renshaw, MD;  Location: [facility] OR;  Service: Radiology;  Laterality: N/A;  . RIGHT/LEFT HEART CATH AND CORONARY ANGIOGRAPHY N/A 05/14/2020   Procedure: RIGHT/LEFT HEART CATH AND CORONARY ANGIOGRAPHY;  Surgeon: Tonny Bollman, MD;  Location: [facility] INVASIVE CV LAB;  Service: Cardiovascular;  Laterality: N/A;  . TOE SURGERY Left    3 rd toe Hammer toe   HPI:  70 y.o. female admitted with HA-  head CT revealed SAH; CT angio right posterior communicating artery aneurysm, s/p coil embolization 3/10. PMHx recent NSTEMI, AAA, COPD,   Assessment / Plan / Recommendation Clinical Impression  Pt was quite drowsy during assessment but could be roused sufficiently for participation.  Her granddaughter, [name], was at bedside.  Pt had difficulty following commands - she is edentulous (dentures present in room) No obvious focal CN deficits - symmetric at baseline.  She consumed water from a straw and applesauce with delayed oral preparation, no s/s of aspiration, even with successive sips and mixed solid/liquid consistencies. Recommend initiating a dysphagia 1 diet with thin liquids; meds whole with puree.  Anticipate rapid diet advancement as MS improves.  SLP will follow for goals/safety. SLP Visit Diagnosis: Dysphagia, oral phase (R13.11)    Aspiration Risk  Mild aspiration risk    Diet Recommendation   dysphagia 1, thin liquids  Medication Administration: Whole meds with puree    Other  Recommendations Oral Care Recommendations: Oral care BID   Follow up Recommendations Other (comment) (tba)      Frequency and Duration min 2x/week  2 weeks       Prognosis Prognosis for Safe Diet Advancement: Good      Swallow Study   General Date of Onset: 05/20/20 HPI: 70 y.o. 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 PMHx recent NSTEMI, AAA, COPD, Type of Study: Bedside Swallow Evaluation Previous Swallow Assessment: no  Diet Prior to this Study: NPO Temperature Spikes Noted: No Respiratory Status: Nasal cannula Behavior/Cognition: Lethargic/Drowsy Oral Cavity Assessment:  (secretions) Oral Care Completed by SLP: Recent completion by staff (suctioning provided) Oral Cavity - Dentition: Edentulous (dentures present but not in place) Self-Feeding Abilities: Needs assist Patient Positioning: Upright in  bed Baseline Vocal Quality: Normal Volitional Cough: Cognitively unable to elicit Volitional Swallow: Able to elicit    Oral/Motor/Sensory Function Overall Oral Motor/Sensory Function: Other (comment) (poor f/c - symmetric at rest)   Ice Chips Ice chips: Within functional limits   Thin Liquid Thin Liquid: Within functional limits    Nectar Thick Nectar Thick Liquid: Not tested   Honey Thick Honey Thick Liquid: Not tested   Puree Puree: Impaired Presentation: Spoon Oral Phase Impairments: Reduced lingual movement/coordination Oral Phase Functional Implications: Prolonged oral transit   Solid     Solid: Not tested      Blenda Mounts Laurice 05/21/2020,9:51 AM  Marchelle Folks L. Samson Frederic, MA CCC/SLP Acute Rehabilitation Services Office number [phone] Pager [phone]

## 2020-05-21 NOTE — Evaluation (Signed)
Speech Language Pathology Evaluation Patient Details Name: Alexandra Mays MRN: [id number] DOB: November 20, 1950 Today's Date: 05/21/2020 Time: 0932-6712 SLP Time Calculation (min) (ACUTE ONLY): 15 min  Problem List:  Patient Active Problem List   Diagnosis Date Noted  . Subarachnoid hemorrhage due to cerebral aneurysm (HCC) 05/20/2020  . Acute respiratory failure with hypoxia (HCC) 05/14/2020  . Essential hypertension 05/14/2020  . Leukocytosis 05/14/2020  . Hyperlipemia 05/14/2020  . Tobacco abuse 05/14/2020  . Stress-induced cardiomyopathy   . NSTEMI (non-ST elevated myocardial infarction) (HCC) 05/13/2020  . S/P aortic aneurysm repair 07/24/2016  . AAA (abdominal aortic aneurysm) (HCC) 07/24/2016  . AAA (abdominal aortic aneurysm) without rupture (HCC) 04/21/2015  . Mild cognitive impairment with memory loss 02/17/2015  . Depressive disorder 02/17/2015   Past Medical History:  Past Medical History:  Diagnosis Date  . AAA (abdominal aortic aneurysm) (HCC)    AAA  . COPD (chronic obstructive pulmonary disease) (HCC)   . Dyspnea    with exertion  . Pure hyperglyceridemia    Past Surgical History:  Past Surgical History:  Procedure Laterality Date  . ABDOMINAL AORTIC ANEURYSM REPAIR N/A 07/24/2016   Procedure: ABDOMINAL AORTIC ANEURYSM  REPAIR;  Surgeon: Sherren Kerns, MD;  Location: [facility] OR;  Service: Vascular;  Laterality: N/A;  . cardiac stenting  2007  . ENDARTERECTOMY Left 07/24/2016   Procedure: AORTA TO LEFT ILIAC ENDARTERECTOMY;  Surgeon: Sherren Kerns, MD;  Location: [facility] OR;  Service: Vascular;  Laterality: Left;  . ENDARTERECTOMY FEMORAL Right 07/24/2016   Procedure: AORTA TO RIGHT FEMORAL ENDARTERECTOMY;  Surgeon: Sherren Kerns, MD;  Location: [facility] OR;  Service: Vascular;  Laterality: Right;  . IR ANGIO INTRA EXTRACRAN SEL INTERNAL CAROTID BILAT MOD SED  05/20/2020  . IR ANGIO VERTEBRAL SEL VERTEBRAL UNI R MOD SED  05/20/2020  . IR ANGIOGRAM FOLLOW UP STUDY   05/20/2020  . IR ANGIOGRAM FOLLOW UP STUDY  05/20/2020  . IR CT HEAD LTD  05/20/2020  . IR TRANSCATH/EMBOLIZ  05/20/2020  . RADIOLOGY WITH ANESTHESIA N/A 05/20/2020   Procedure: IR WITH ANESTHESIA - ANEURYSM;  Surgeon: Lisbeth Renshaw, MD;  Location: [facility] OR;  Service: Radiology;  Laterality: N/A;  . RIGHT/LEFT HEART CATH AND CORONARY ANGIOGRAPHY N/A 05/14/2020   Procedure: RIGHT/LEFT HEART CATH AND CORONARY ANGIOGRAPHY;  Surgeon: Tonny Bollman, MD;  Location: [facility] INVASIVE CV LAB;  Service: Cardiovascular;  Laterality: N/A;  . TOE SURGERY Left    3 rd toe Hammer toe   HPI:  70 y.o. 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 PMHx recent NSTEMI, AAA, COPD. Lives with granddaughter Alexandra Mays. Dx'd with memory disorder five years ago (needs reminders re: appts, meds; [name] leaves sticky notes with reminders around the house) but still drives, picks grandchildren up from school.  Assessment / Plan / Recommendation Clinical Impression  Pt's lethargy impacted participation- she followed simple commands intermittently. Was oriented to person only.  Limited spontaneous communication, however speech was fluent and clear without dysarthria.  Poor attention.  SLP will follow along for improved wakefulness and ongoing cognitive assessment.  D/W granddaughter, Alexandra Mays.    SLP Assessment  SLP Recommendation/Assessment: Patient needs continued Speech Lanaguage Pathology Services SLP Visit Diagnosis: Cognitive communication deficit (R41.841)    Follow Up Recommendations  Other (comment) (tba)    Frequency and Duration min 2x/week  2 weeks      SLP Evaluation Cognition  Overall Cognitive Status: Impaired/Different from baseline Arousal/Alertness:  Lethargic Orientation Level: Oriented to person;Disoriented to place;Disoriented to time Attention: Focused Focused Attention: Impaired Focused Attention Impairment: Verbal  basic Memory: Impaired Memory Impairment: Storage deficit Awareness: Impaired Awareness Impairment: Intellectual impairment       Comprehension  Auditory Comprehension Yes/No Questions: Not tested Commands: Impaired One Step Basic Commands: 0-24% accurate Visual Recognition/Discrimination Discrimination: Not tested Reading Comprehension Reading Status: Not tested    Expression Expression Primary Mode of Expression: Verbal Verbal Expression Overall Verbal Expression: Appears within functional limits for tasks assessed   Oral / Motor  Oral Motor/Sensory Function Overall Oral Motor/Sensory Function: Other (comment) (symmetric at rest) Motor Speech Overall Motor Speech: Appears within functional limits for tasks assessed   GO                    Blenda Mounts Laurice 05/21/2020, 9:59 AM  Marchelle Folks L. Samson Frederic, MA CCC/SLP Acute Rehabilitation Services Office number [phone] Pager [phone]

## 2020-05-21 NOTE — Evaluation (Signed)
Physical Therapy Evaluation Patient Details Name: Alexandra Mays MRN: [id number] DOB: 03-Sep-1950 Today's Date: 05/21/2020   History of Present Illness  Pt is a 70 y.o. female who presented 05/20/20 with R neck pain and a headache. 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 Pt recently discharged from hospital 05/16/20 with NSTEMI. 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 PMH: dyspnea, COPD, AAA, pure hyperglyceridemia.  Clinical Impression  Pt presents with condition above and deficits mentioned below, see PT Problem List. PTA, she was independent with all functional mobility without use of an AD/AE and living with family members. She did have some STM deficits, requiring assistance from her granddaughter for keeping appointments and medication management though. Currently, pt is very lethargic with eyes closed majority of session and displaying impaired awareness of deficits and safety, sequencing, and ability to follow simple commands. In addition, she demonstrates decreased bil lower extremity strength and coordination (R > L) with mobility impacting her balance, placing her at risk for falls. Pt requiring min-modA with UE support to initiate, sequence, and maintain safety with all functional mobility this date. Will continue to follow acutely. As pt's current functional level varies greatly from her PLOF she could benefit from intensive therapies in the CIR setting to maximize her independence and safety with all functional mobility.    Follow Up Recommendations CIR;Supervision/Assistance - 24 hour    Equipment Recommendations  Rolling walker with 5" wheels;3in1 (PT);Wheelchair cushion (measurements PT);Wheelchair (measurements PT) (may change with progression)    Recommendations for Other Services Rehab consult     Precautions / Restrictions  Precautions Precautions: Fall Restrictions Weight Bearing Restrictions: No      Mobility  Bed Mobility Overal bed mobility: Needs Assistance Bed Mobility: Rolling;Sidelying to Sit;Sit to Supine Rolling: Mod assist Sidelying to sit: HOB elevated;Max assist   Sit to supine: Mod assist   General bed mobility comments: ModA to initiate roll and return to supine from sitting EOB, using bed rails. MaxA to ascend trunk and manage legs off bed to sit up.    Transfers Overall transfer level: Needs assistance Equipment used: 1 person hand held assist Transfers: Sit to/from Stand Sit to Stand: Min assist         General transfer comment: MinA to initiate power up to stand and steady with 1-2 UE support on PT anterior to pt, x2 reps from EOB.  Ambulation/Gait Ambulation/Gait assistance: Mod assist Gait Distance (Feet): 10 Feet Assistive device: 1 person hand held assist Gait Pattern/deviations: Step-through pattern;Decreased step length - right;Decreased step length - left;Decreased stride length;Decreased weight shift to right;Decreased weight shift to left;Shuffle;Narrow base of support Gait velocity: reduced Gait velocity interpretation: <1.31 ft/sec, indicative of household ambulator General Gait Details: Pt with eyes closed despite cues. Requiring 1-2 UE support on PT anterior to pt and physical assist for steadying and to weight shift bil and advance R leg with all gait. Pt tends to try to avoid leaning over to R leg despite cues. ModA for safety and for cuing. Poor bil feet clearance.  Stairs            Wheelchair Mobility    Modified Rankin (Stroke Patients Only) Modified Rankin (Stroke Patients Only) Pre-Morbid Rankin Score: No significant disability Modified Rankin: Moderately severe disability     Balance Overall balance assessment: Needs assistance Sitting-balance support: Bilateral upper extremity supported;Feet supported Sitting balance-Leahy Scale:  Poor Sitting balance -  Comments: UE support sitting EOB with min guard-A   Standing balance support: Bilateral upper extremity supported;Single extremity supported;During functional activity Standing balance-Leahy Scale: Poor Standing balance comment: 1-2 UE support and external assist                             Pertinent Vitals/Pain Pain Assessment: Faces Faces Pain Scale: Hurts little more Pain Location: generalized with mobility Pain Descriptors / Indicators: Discomfort;Grimacing;Guarding;Moaning Pain Intervention(s): Limited activity within patient's tolerance;Monitored during session;Premedicated before session;Repositioned (RN reports pt recently was administered morphine)    Home Living Family/patient expects to be discharged to:: Private residence Living Arrangements: Other relatives (granddaughter and her family) Available Help at Discharge: Family;Available 24 hours/day Type of Home: House Home Access: Stairs to enter Entrance Stairs-Rails: Doctor, general practice of Steps: 3 Home Layout: Two level;Able to live on main level with bedroom/bathroom Home Equipment: Grab bars - tub/shower      Prior Function Level of Independence: Independent         Comments: Pt's daughter reports pt has some STM deficits, confusing dates and times at baseline; drives herself and handles her own finances; needs assistance with keeping appointments and with medications; independent with all functional mobility without AD/AE, gets SOB with stairs though     Hand Dominance   Dominant Hand: Right (per chart review)    Extremity/Trunk Assessment   Upper Extremity Assessment Upper Extremity Assessment: Defer to OT evaluation    Lower Extremity Assessment Lower Extremity Assessment: Generalized weakness;RLE deficits/detail;LLE deficits/detail RLE Deficits / Details: Generalized weakness with observing mobility as pt unable to follow commands to formally assess;  decreased coordination, especially on the R as she needed physical assistance to weight shift and advance the R leg RLE Coordination: decreased fine motor;decreased gross motor LLE Deficits / Details: Generalized weakness with observing mobility as pt unable to follow commands to formally assess; decreased coordination LLE Coordination: decreased gross motor;decreased fine motor    Cervical / Trunk Assessment Cervical / Trunk Assessment: Normal  Communication   Communication: No difficulties  Cognition Arousal/Alertness: Lethargic Behavior During Therapy: Flat affect Overall Cognitive Status: Impaired/Different from baseline Area of Impairment: Attention;Memory;Following commands;Safety/judgement;Awareness;Problem solving                   Current Attention Level: Divided Memory: Decreased short-term memory Following Commands: Follows one step commands inconsistently;Follows one step commands with increased time Safety/Judgement: Decreased awareness of safety;Decreased awareness of deficits Awareness: Intellectual Problem Solving: Slow processing;Decreased initiation;Difficulty sequencing;Requires verbal cues;Requires tactile cues General Comments: Pt's daughter reports pt has some STM deficits, confusing dates and times at baseline. Pt lethargic keeping eyes closed majority of session, thereby needing max multi-modal simple cues to initiate and sequence all tasks to maintain pt safety.      General Comments General comments (skin integrity, edema, etc.): HR 40-50s at rest start of session with pt asleep, up to 70s with mobility; SpO2 >/= 90% throughout on RA; BP within appropriate range    Exercises     Assessment/Plan    PT Assessment Patient needs continued PT services  PT Problem List Decreased strength;Decreased range of motion;Decreased activity tolerance;Decreased mobility;Decreased balance;Decreased coordination;Decreased cognition;Decreased knowledge of use of  DME;Decreased safety awareness;Decreased knowledge of precautions       PT Treatment Interventions DME instruction;Gait training;Stair training;Functional mobility training;Therapeutic activities;Therapeutic exercise;Balance training;Neuromuscular re-education;Cognitive remediation;Patient/family education    PT Goals (Current goals can be found in the Care Plan section)  Acute  Rehab PT Goals Patient Stated Goal: did not state; granddaughter desires pt to improve PT Goal Formulation: With patient/family Time For Goal Achievement: 06/04/20 Potential to Achieve Goals: Good    Frequency Min 4X/week   Barriers to discharge        Co-evaluation               AM-PAC PT "6 Clicks" Mobility  Outcome Measure Help needed turning from your back to your side while in a flat bed without using bedrails?: A Lot Help needed moving from lying on your back to sitting on the side of a flat bed without using bedrails?: A Lot Help needed moving to and from a bed to a chair (including a wheelchair)?: A Little Help needed standing up from a chair using your arms (e.g., wheelchair or bedside chair)?: A Lot Help needed to walk in hospital room?: A Lot Help needed climbing 3-5 steps with a railing? : A Lot 6 Click Score: 13    End of Session   Activity Tolerance: Patient limited by lethargy Patient left: in bed;with call bell/phone within reach;with bed alarm set;with SCD's reapplied Nurse Communication: Mobility status;Other (comment) (lethargy, vitals) PT Visit Diagnosis: Unsteadiness on feet (R26.81);Other abnormalities of gait and mobility (R26.89);Muscle weakness (generalized) (M62.81);Difficulty in walking, not elsewhere classified (R26.2);Other symptoms and signs involving the nervous system (R29.898)    Time: 4975-3005 PT Time Calculation (min) (ACUTE ONLY): 29 min   Charges:   PT Evaluation $PT Eval Moderate Complexity: 1 Mod PT Treatments $Therapeutic Activity: 8-22 mins         Raymond Gurney, PT, DPT Acute Rehabilitation Services  Pager: [phone] Office: [phone]   Jewel Baize 05/21/2020, 5:36 PM

## 2020-05-21 NOTE — Progress Notes (Signed)
Rehab Admissions Coordinator Note:  Patient was screened by Clois Dupes for appropriateness for an Inpatient Acute Rehab Consult per therapy recs.   At this time, we are recommending Inpatient Rehab consult. I will place order per protocol.  Clois Dupes RN MSN 05/21/2020, 7:26 PM  I can be reached at [phone].

## 2020-05-21 NOTE — Progress Notes (Signed)
PT Cancellation Note  Patient Details Name: Alexandra Mays MRN: [id number] DOB: [birth year]   Cancelled Treatment:    Reason Eval/Treat Not Completed: Active bedrest order. RN to notify PT if bedrest orders are lifted for PT eval later today. Will follow-up as appropriate.  Raymond Gurney, PT, DPT Acute Rehabilitation Services  Pager: [phone] Office: [phone]    Jewel Baize 05/21/2020, 8:03 AM

## 2020-05-21 NOTE — Progress Notes (Signed)
OT Cancellation Note  Patient Details Name: QUISHA MABIE MRN: [id number] DOB: 07/09/1950   Cancelled Treatment:    Reason Eval/Treat Not Completed: Active bedrest order OT order received and appreciated however this conflicts with current bedrest order set. Please increase activity tolerance as appropriate and remove bedrest from orders. . Please contact OT at [phone] if bed rest order is discontinued. OT will hold evaluation at this time and will check back as time allows pending increased activity orders.   Wynona Neat, OTR/L  Acute Rehabilitation Services Pager: [phone] Office: [phone] .  05/21/2020, 8:56 AM

## 2020-05-22 DIAGNOSIS — I5022 Chronic systolic (congestive) heart failure: Secondary | ICD-10-CM | POA: Diagnosis not present

## 2020-05-22 DIAGNOSIS — I608 Other nontraumatic subarachnoid hemorrhage: Secondary | ICD-10-CM | POA: Diagnosis not present

## 2020-05-22 MED ORDER — LEVETIRACETAM 100 MG/ML PO SOLN
500.0000 mg | Freq: Two times a day (BID) | ORAL | Status: DC
  Administered 2020-05-23: 500 mg via ORAL
  Filled 2020-05-22 (×2): qty 5

## 2020-05-22 MED ORDER — MORPHINE SULFATE (PF) 2 MG/ML IV SOLN
2.0000 mg | INTRAVENOUS | Status: DC | PRN
  Administered 2020-05-23: 2 mg via INTRAVENOUS
  Filled 2020-05-22: qty 1

## 2020-05-22 MED ORDER — LEVETIRACETAM 500 MG PO TABS: 500.0000 mg | ORAL_TABLET | Freq: Two times a day (BID) | ORAL | Status: DC

## 2020-05-22 NOTE — Progress Notes (Signed)
Inpatient Rehab Admissions:  Inpatient Rehab Consult received.  I met with patient and granddaughter, Alexandra Mays at the bedside for rehabilitation assessment and to discuss goals and expectations of an inpatient rehab admission.  Pt was asleep so spoke with granddaughter.  She acknowledged understanding of goals and expectations. She is interested in pt pursuing CIR.  Will continue to follow.  Signed: Gayland Curry, [place], [place] Admissions Coordinator [phone]

## 2020-05-22 NOTE — Progress Notes (Signed)
Spoke with Dr. Wynetta Emery at bedside while attempting to administer oral Nimotop. Patient unwilling to drink medication and unable to take capsule form. Dr. Wynetta Emery gave VO to attempt to administer and do not force administration if patient is unable to take. Per MD, do not place NG unless patient nutrition becomes and issue.

## 2020-05-22 NOTE — Progress Notes (Signed)
Subjective: Patient reports Feeling little better this morning  Objective: Vital signs in last 24 hours: Temp:  [97.9 F (36.6 C)-99.1 F (37.3 C)] 97.9 F (36.6 C) (03/12 0345) Pulse Rate:  [51-91] 80 (03/12 0831) Resp:  [11-23] 14 (03/12 0831) BP: (119-151)/(54-108) 139/63 (03/12 0800) SpO2:  [82 %-100 %] 99 % (03/12 0831)  Intake/Output from previous day: 03/11 0701 - 03/12 0700 In: 2175.6 [I.V.:2175.6] Out: 1200 [Urine:1200] Intake/Output this shift: Total I/O In: 100 [I.V.:100] Out: -   Awakens to voice and stimulation attempting to take n.p.o.  Follows commands all 4 symmetric strength.  Lab Results: Recent Labs    05/20/20 1044 05/20/20 1055  WBC 9.4  --   HGB 12.0 11.9*  HCT 35.8* 35.0*  PLT 151  --    BMET Recent Labs    05/20/20 1044 05/20/20 1055  NA 132* 135  K 3.7 3.7  CL 102 100  CO2 25  --   GLUCOSE 93 85  BUN 17 21  CREATININE 0.83 0.70  CALCIUM 8.4*  --     Studies/Results: CT Angio Head W or Wo Contrast  Result Date: 05/20/2020 CLINICAL DATA:  Right-sided headache.  Rule out aneurysm. EXAM: CT ANGIOGRAPHY HEAD AND NECK TECHNIQUE: Multidetector CT imaging of the head and neck was performed using the standard protocol during bolus administration of intravenous contrast. Multiplanar CT image reconstructions and MIPs were obtained to evaluate the vascular anatomy. Carotid stenosis measurements (when applicable) are obtained utilizing NASCET criteria, using the distal internal carotid diameter as the denominator. CONTRAST:  75mL OMNIPAQUE IOHEXOL 350 MG/ML SOLN COMPARISON:  MRI head 06/03/2015 FINDINGS: CT HEAD FINDINGS Brain: Acute subarachnoid hemorrhage. There is a moderate amount of subarachnoid hemorrhage which is predominately right-sided extending into the sylvian fissure. There is blood in the suprasellar cistern and prepontine cistern also extending to the interhemispheric fissure anteriorly. Ventricle size normal. Small amount of hemorrhage  accumulated in the occipital horns bilaterally. Mild patchy hypodensity in the cerebral white matter bilaterally consistent with chronic microvascular ischemia. No acute infarct or mass. Vascular: Obscured by subarachnoid hemorrhage. Skull: No acute skeletal abnormality. Total of 4 screws in the frontal bone bilaterally, 2 on each sign Sinuses: Mild mucosal edema paranasal sinuses. Orbits: Negative Review of the MIP images confirms the above findings CTA NECK FINDINGS Aortic arch: Atherosclerotic calcification in the aortic arch without aneurysm. Bovine branching pattern. Mild stenosis proximal left common carotid artery. Atherosclerotic calcification throughout the proximal great vessels. Right carotid system: Mild atherosclerotic calcification right common carotid artery. Atherosclerotic calcification right carotid bifurcation. 30% diameter stenosis proximal right internal carotid artery. Mild stenosis origin of right external carotid artery. Left carotid system: Atherosclerotic calcification left common carotid artery with mild stenosis at the origin. Atherosclerotic calcification left carotid bifurcation without significant stenosis. Vertebral arteries: Left vertebral dominant. Both vertebral arteries patent to the basilar without stenosis or aneurysm. Skeleton: Degenerative changes throughout the cervical spine with disc and facet degeneration. No acute skeletal abnormality. Other neck: Negative for mass or adenopathy. Upper chest: Extensive apical emphysema bilaterally. Apical scarring bilaterally. Review of the MIP images confirms the above findings CTA HEAD FINDINGS Anterior circulation: Atherosclerotic calcification in the cavernous carotid bilaterally without stenosis. 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 No extravasation. Small right posterior communicating artery is patent. Posterior circulation: Both  vertebral arteries patent to the basilar. PICA patent bilaterally. Basilar patent. Superior cerebellar and posterior cerebral arteries patent bilaterally. Small  posterior communicating artery bilaterally. Venous sinuses: Normal venous enhancement Anatomic variants: None Review of the MIP images confirms the above findings IMPRESSION: 1. Acute subarachnoid hemorrhage predominately on the right side. Mild amount of blood in the ventricles without hydrocephalus. 2. 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 No other aneurysm 3. Moderate atherosclerotic disease involving the aortic arch and carotid artery bilaterally. No flow limiting stenosis in the carotid or vertebral artery. 4. These results were called by telephone at the time of interpretation on 05/20/2020 at 10:52 am to provider [facility] , who verbally acknowledged these results. Electronically Signed   By: Marlan Palau M.D.   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 COMPARISON:  05/20/2020 FINDINGS: Brain: Aneurysm coil within the region of the right posterior communicating artery, with significant streak artifact. The areas of subarachnoid hemorrhage seen in the suprasellar region on the preoperative evaluation are again noted. Intraventricular hemorrhage layering dependently within the occipital horns of the lateral ventricles is new since postprocedural CT. Decreased intraventricular hemorrhage within the third ventricle. Mild hydrocephalus, with increased prominence of the lateral and third ventricles since prior studies. 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 Extensive hypodensities throughout the periventricular and subcortical white matter likely reflect an element of transependymal CSF flow superimposed upon chronic small vessel ischemic change. High attenuation material seen throughout the bilateral sulci may reflect a combination of subarachnoid hemorrhage and contrast staining after interventional procedure. No extra-axial fluid collection. Vascular: Post therapeutic changes from right posterior communicating artery aneurysm embolization. Mild atherosclerosis within the internal carotid arteries. Skull: Negative for fracture or focal lesion. Screws are again noted within the bilateral frontal bones. Sinuses/Orbits: Minimal mucosal thickening within the left sphenoid sinus. Remaining sinuses are clear. Other: None. IMPRESSION: 1. Diffuse areas of high attenuation throughout the bilateral sulci, likely a combination of residual subarachnoid hemorrhage and postprocedural contrast staining. 2. Persistent subarachnoid hemorrhage in the suprasellar region, with intraventricular hemorrhage in the bilateral lateral ventricles and third ventricles as above. 3. Mild hydrocephalus which has developed in the interim. 4. Developing hypodensities within the right insula and frontotemporal regions, likely representing acute cortical infarct. 5. Diffuse hypodensities throughout the periventricular white matter, likely representing an element of transependymal CSF flow and chronic small vessel ischemic change. Electronically Signed   By: Sharlet Salina M.D.   On: 05/21/2020 21:44   CT Angio Neck W and/or Wo Contrast  Result Date: 05/20/2020 CLINICAL DATA:  Right-sided headache.  Rule out aneurysm. EXAM: CT ANGIOGRAPHY HEAD AND NECK TECHNIQUE: Multidetector CT imaging of the head and neck was performed using the standard protocol during bolus administration of intravenous contrast. Multiplanar CT  image reconstructions and MIPs were obtained to evaluate the vascular anatomy. Carotid stenosis measurements (when applicable) are obtained utilizing NASCET criteria, using the distal internal carotid diameter as the denominator. CONTRAST:  75mL OMNIPAQUE IOHEXOL 350 MG/ML SOLN COMPARISON:  MRI head 06/03/2015 FINDINGS: CT HEAD FINDINGS Brain: Acute subarachnoid hemorrhage. There is a moderate amount of subarachnoid hemorrhage which is predominately right-sided extending into the sylvian fissure. There is blood in the suprasellar cistern and prepontine cistern also extending to the interhemispheric fissure  anteriorly. Ventricle size normal. Small amount of hemorrhage accumulated in the occipital horns bilaterally. Mild patchy hypodensity in the cerebral white matter bilaterally consistent with chronic microvascular ischemia. No acute infarct or mass. Vascular: Obscured by subarachnoid hemorrhage. Skull: No acute skeletal abnormality. Total of 4 screws in the frontal bone bilaterally, 2 on each sign Sinuses: Mild mucosal edema paranasal sinuses. Orbits: Negative Review of the MIP images confirms the above findings CTA NECK FINDINGS Aortic arch: Atherosclerotic calcification in the aortic arch without aneurysm. Bovine branching pattern. Mild stenosis proximal left common carotid artery. Atherosclerotic calcification throughout the proximal great vessels. Right carotid system: Mild atherosclerotic calcification right common carotid artery. Atherosclerotic calcification right carotid bifurcation. 30% diameter stenosis proximal right internal carotid artery. Mild stenosis origin of right external carotid artery. Left carotid system: Atherosclerotic calcification left common carotid artery with mild stenosis at the origin. Atherosclerotic calcification left carotid bifurcation without significant stenosis. Vertebral arteries: Left vertebral dominant. Both vertebral arteries patent to the basilar without stenosis or  aneurysm. Skeleton: Degenerative changes throughout the cervical spine with disc and facet degeneration. No acute skeletal abnormality. Other neck: Negative for mass or adenopathy. Upper chest: Extensive apical emphysema bilaterally. Apical scarring bilaterally. Review of the MIP images confirms the above findings CTA HEAD FINDINGS Anterior circulation: Atherosclerotic calcification in the cavernous carotid bilaterally without stenosis. 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 No extravasation. Small right posterior communicating artery is patent. Posterior circulation: Both vertebral arteries patent to the basilar. PICA patent bilaterally. Basilar patent. Superior cerebellar and posterior cerebral arteries patent bilaterally. Small posterior communicating artery bilaterally. Venous sinuses: Normal venous enhancement Anatomic variants: None Review of the MIP images confirms the above findings IMPRESSION: 1. Acute subarachnoid hemorrhage predominately on the right side. Mild amount of blood in the ventricles without hydrocephalus. 2. 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 No other aneurysm 3. Moderate atherosclerotic disease involving the aortic arch and carotid artery bilaterally. No flow limiting stenosis in the carotid or vertebral artery. 4. These results were called by telephone at the time of interpretation on 05/20/2020 at 10:52 am to provider [facility] , who verbally acknowledged these results. Electronically Signed   By: Marlan Palau M.D.   On: 05/20/2020 10:53   IR Transcath/Emboliz  Result Date: 05/20/2020 PROCEDURE: DIAGNOSTIC CEREBRAL ANGIOGRAM COIL EMBOLIZATION OF RIGHT POSTERIOR COMMUNICATING ARTERY ANEURYSM HISTORY: The patient is a 70 year old woman presenting to the hospital with sudden onset of severe headache and neck pain. CT scan  demonstrated diffuse basal subarachnoid hemorrhage with CT angiogram demonstrating the presence of a right posterior communicating artery aneurysm. Patient therefore presents for further workup with diagnostic cerebral angiogram and possible aneurysm coiling. ACCESS: The technical aspects of the procedure as well as its potential risks and benefits were reviewed with the patient's family. These risks included but were not limited to stroke, intracranial hemorrhage, bleeding, infection, allergic reaction, damage to organs or vital structures, stroke, non-diagnostic procedure, and the catastrophic outcomes of heart attack, coma, and death. With an understanding of these risks, informed consent was obtained and witnessed. The patient was placed in the supine position on the angiography table and the skin of right groin prepped in the usual sterile fashion. The procedure was performed under general anesthesia. A 5-French sheath was introduced in the right common femoral artery using Seldinger technique. MEDICATIONS: HEPARIN: 0 Units total. CONTRAST:  74mL OMNIPAQUE IOHEXOL 300 MG/ML SOLN,  25mL OMNIPAQUE IOHEXOL 300 MG/ML SOLNcc, Omnipaque 300 FLUOROSCOPY TIME:  FLUOROSCOPY TIME: See IR records TECHNIQUE: CATHETERS AND WIRES 5-French JB-1 glidecatheter 5-French Simmons-2 glidecatheter 180 cm 0.035" glidewire 6-French NeuronMax guide sheath 6-French Berenstein Select JB-1 catheter 0.058" CatV guidecatheter Synchro 2 standard microwire Excelsior XT-17 microcatheter Excelsior XT-27 microcatheter Synchro select standard microwire COILS USED Target 360 XL 8mm x 30cm Target 360 XL 7mm x 20cm VESSELS CATHETERIZED Right internal carotid Left common carotid Left vertebral Right vertebral Right common femoral VESSELS STUDIED Right internal carotid, head Left common carotid, head Left vertebral Right vertebral Right internal carotid artery, head (during embolization) Right internal carotid artery, head (immediate post-embolization)  Right internal carotid artery, head (final control) Right common femoral PROCEDURAL NARRATIVE A 5-Fr JB-1 glide catheter was advanced over a 0.035 glidewire into the aortic arch. The right vertebral, right internal carotid, and left vertebral arteries vessels were then sequentially catheterized and cervical / cerebral angiograms taken. After review of images, the catheter was removed without incident. The [facility] 2 catheter was introduced and reformed over the aortic arch. The left common carotid artery was then catheterized and cerebral angiogram taken. The Simmons catheter was then removed. The 5-Fr sheath was then exchanged over the glidewire for a 25cm 8-Fr sheath. Under real-time fluoroscopy, the guide sheath was advanced over the select catheter and glidewire into the descending aorta. The distal cervical right internal carotid artery was then selected with the wire and the select catheter was then advanced into the proximal cervical right internal carotid artery. The guide sheath was then advanced into the proximal cervical internal carotid artery. The Select catheter was removed and the 058 guide catheter was coaxially introduced over the microcatheter and microwire. The 27 microcatheter was then advanced under roadmap guidance into the middle cerebral artery. The guide catheter was then tracked over the microcatheter to its final position in the distal cavernous segment of the internal carotid. The 27 microcatheter was then removed and the coiling 17 microcatheter introduced over the microwire. The catheter was then navigated into the aneurysm lumen over the microwire. The wire was then removed. The above coils were then deployed with progressive exclusion of the aneurysm. During deployment of the second coil, the last few cm of the coil caused prolapse of the microcatheter into the internal carotid artery. This required multiple attempts to track the microcatheter back over the coil into the aneurysm  lumen. Eventually we were able to deployed the remainder of the coil within the aneurysm. Once this aneurysm coil was deployed due to the difficulty to keep the microcatheter within the aneurysm lumen I elected not to attempt to place further coils. Cerebral angiography was performed immediately post embolization. The coiling catheter was then removed and the guide catheter withdrawn into the cervical internal carotid artery. Final control angiography was then performed from the guide catheter. The guide catheter and guide sheath were then synchronously removed without incident. FINDINGS: Right internal carotid, head: Injection reveals the presence of a widely patent ICA, M1, and A1 segments and their branches. 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 This measures approximately 9.0 x 7.6 x 7.2 mm. Neck of the aneurysm appears relatively narrow. The parenchymal and venous phases are normal. The venous sinuses are widely patent. Left common carotid, head: Injection reveals the presence of a widely patent ICA, A1, and M1 segments and their branches. No aneurysms, AVMs, or high-flow fistulas are seen. The parenchymal and venous phases are normal. The venous  sinuses are widely patent. Left vertebral: Injection reveals the presence of a widely patent vertebral artery. This leads to a widely patent basilar artery that terminates in bilateral P1. Basilar apex is relatively patulous, however no aneurysm is seen. There are no AVMs, or high-flow fistulas seen. The parenchymal and venous phases are normal. The venous sinuses are widely patent. Right vertebral: The vertebral artery is widely patent and essentially terminates as a right PICA. No PICA aneurysm is seen. See basilar description above. Right internal carotid artery, head (during embolization): Injection reveals the presence of a widely patent ICA that leads to a patent ACA and MCA. Coil mass within the aneurysm  is stable, without coil prolapse or filling defect to suggest thrombus. Right internal carotid artery, head (immediate post-embolization): Injection reveals the presence of a widely patent ICA that leads to a patent ACA and MCA. Coil mass within the aneurysm is stable, without coil prolapse or filling defect to suggest thrombus. There is some aneurysm filling primarily at the dome of the aneurysm however this is in a delayed fashion. There is significant contrast stasis within this portion of the aneurysm. Right internal carotid artery, head (final control): Injection reveals the presence of a widely patent ICA that leads to a patent ACA and MCA. No thrombus is visualized. Coil mass is seen within the aneurysm and is in stable position. Again seen is some filling of the aneurysm with significant contrast stasis. There is some contrast seen during the venous phase adjacent to the aneurysm dome which appears to be venous in nature and does not have the appearance of contrast extravasation. Capillary phase does not demonstrate any perfusion deficits. Venous sinuses remain patent. Right femoral: Normal vessel. No significant atherosclerotic disease. Arterial sheath in adequate position. Dyna CT head: Dyna CT performed in the angiography suite was reconstructed on an independent workstation for review. Images again reveal diffuse basal subarachnoid hemorrhage. In comparison to the preoperative CT scan there does appear to be new hemorrhage within the fourth ventricle and third ventricle. There does not appear to be any significant increase in the amount of subarachnoid hemorrhage. No significant hydrocephalus. DISPOSITION: Upon completion of the study, the femoral sheath was removed and hemostasis obtained by manual compression. Good proximal and distal lower extremity pulses were documented upon achievement of hemostasis. The procedure was well tolerated and no early complications were observed. The patient was  transferred to the postanesthesia care unit in stable hemodynamic condition. IMPRESSION: 1. 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 Some contrast filling is seen within the aneurysm post embolization with significant stasis. The preliminary results of this procedure were shared with the patient's family. Electronically Signed   By: Lisbeth RenshawNeelesh  Nundkumar   On: 05/20/2020 15:56   IR Angiogram Follow Up Study  Result Date: 05/20/2020 PROCEDURE: DIAGNOSTIC CEREBRAL ANGIOGRAM COIL EMBOLIZATION OF RIGHT POSTERIOR COMMUNICATING ARTERY ANEURYSM HISTORY: The patient is a 70 year old woman presenting to the hospital with sudden onset of severe headache and neck pain. 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 Patient therefore presents for further workup with diagnostic cerebral angiogram and possible aneurysm coiling. ACCESS: The technical aspects of the procedure as well as its potential risks and benefits were reviewed with the patient's family. These risks included but were not limited to stroke, intracranial hemorrhage, bleeding, infection, allergic reaction, damage to organs or vital structures, stroke, non-diagnostic procedure, and the catastrophic outcomes of heart  attack, coma, and death. With an understanding of these risks, informed consent was obtained and witnessed. The patient was placed in the supine position on the angiography table and the skin of right groin prepped in the usual sterile fashion. The procedure was performed under general anesthesia. A 5-French sheath was introduced in the right common femoral artery using Seldinger technique. MEDICATIONS: HEPARIN: 0 Units total. CONTRAST:  97mL OMNIPAQUE IOHEXOL 300 MG/ML SOLN, 93mL OMNIPAQUE IOHEXOL 300 MG/ML SOLNcc, Omnipaque 300 FLUOROSCOPY TIME:  FLUOROSCOPY TIME: See IR records TECHNIQUE:  CATHETERS AND WIRES 5-French JB-1 glidecatheter 5-French Simmons-2 glidecatheter 180 cm 0.035" glidewire 6-French NeuronMax guide sheath 6-French Berenstein Select JB-1 catheter 0.058" CatV guidecatheter Synchro 2 standard microwire Excelsior XT-17 microcatheter Excelsior XT-27 microcatheter Synchro select standard microwire COILS USED Target 360 XL 86mm x 30cm Target 360 XL 22mm x 20cm VESSELS CATHETERIZED Right internal carotid Left common carotid Left vertebral Right vertebral Right common femoral VESSELS STUDIED Right internal carotid, head Left common carotid, head Left vertebral Right vertebral Right internal carotid artery, head (during embolization) Right internal carotid artery, head (immediate post-embolization) Right internal carotid artery, head (final control) Right common femoral PROCEDURAL NARRATIVE A 5-Fr JB-1 glide catheter was advanced over a 0.035 glidewire into the aortic arch. The right vertebral, right internal carotid, and left vertebral arteries vessels were then sequentially catheterized and cervical / cerebral angiograms taken. After review of images, the catheter was removed without incident. The [facility] 2 catheter was introduced and reformed over the aortic arch. The left common carotid artery was then catheterized and cerebral angiogram taken. The Simmons catheter was then removed. The 5-Fr sheath was then exchanged over the glidewire for a 25cm 8-Fr sheath. Under real-time fluoroscopy, the guide sheath was advanced over the select catheter and glidewire into the descending aorta. The distal cervical right internal carotid artery was then selected with the wire and the select catheter was then advanced into the proximal cervical right internal carotid artery. The guide sheath was then advanced into the proximal cervical internal carotid artery. The Select catheter was removed and the 058 guide catheter was coaxially introduced over the microcatheter and microwire. The 27 microcatheter was  then advanced under roadmap guidance into the middle cerebral artery. The guide catheter was then tracked over the microcatheter to its final position in the distal cavernous segment of the internal carotid. The 27 microcatheter was then removed and the coiling 17 microcatheter introduced over the microwire. The catheter was then navigated into the aneurysm lumen over the microwire. The wire was then removed. The above coils were then deployed with progressive exclusion of the aneurysm. During deployment of the second coil, the last few cm of the coil caused prolapse of the microcatheter into the internal carotid artery. This required multiple attempts to track the microcatheter back over the coil into the aneurysm lumen. Eventually we were able to deployed the remainder of the coil within the aneurysm. Once this aneurysm coil was deployed due to the difficulty to keep the microcatheter within the aneurysm lumen I elected not to attempt to place further coils. Cerebral angiography was performed immediately post embolization. The coiling catheter was then removed and the guide catheter withdrawn into the cervical internal carotid artery. Final control angiography was then performed from the guide catheter. The guide catheter and guide sheath were then synchronously removed without incident. FINDINGS: Right internal carotid, head: Injection reveals the presence of a widely patent ICA, M1, and A1 segments and their branches. 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 This measures approximately 9.0 x 7.6 x 7.2 mm. Neck of the aneurysm appears relatively narrow. The parenchymal and venous phases are normal. The venous sinuses are widely patent. Left common carotid, head: Injection reveals the presence of a widely patent ICA, A1, and M1 segments and their branches. No aneurysms, AVMs, or high-flow fistulas are seen. The parenchymal and venous phases are normal. The  venous sinuses are widely patent. Left vertebral: Injection reveals the presence of a widely patent vertebral artery. This leads to a widely patent basilar artery that terminates in bilateral P1. Basilar apex is relatively patulous, however no aneurysm is seen. There are no AVMs, or high-flow fistulas seen. The parenchymal and venous phases are normal. The venous sinuses are widely patent. Right vertebral: The vertebral artery is widely patent and essentially terminates as a right PICA. No PICA aneurysm is seen. See basilar description above. Right internal carotid artery, head (during embolization): Injection reveals the presence of a widely patent ICA that leads to a patent ACA and MCA. Coil mass within the aneurysm is stable, without coil prolapse or filling defect to suggest thrombus. Right internal carotid artery, head (immediate post-embolization): Injection reveals the presence of a widely patent ICA that leads to a patent ACA and MCA. Coil mass within the aneurysm is stable, without coil prolapse or filling defect to suggest thrombus. There is some aneurysm filling primarily at the dome of the aneurysm however this is in a delayed fashion. There is significant contrast stasis within this portion of the aneurysm. Right internal carotid artery, head (final control): Injection reveals the presence of a widely patent ICA that leads to a patent ACA and MCA. No thrombus is visualized. Coil mass is seen within the aneurysm and is in stable position. Again seen is some filling of the aneurysm with significant contrast stasis. There is some contrast seen during the venous phase adjacent to the aneurysm dome which appears to be venous in nature and does not have the appearance of contrast extravasation. Capillary phase does not demonstrate any perfusion deficits. Venous sinuses remain patent. Right femoral: Normal vessel. No significant atherosclerotic disease. Arterial sheath in adequate position. Dyna CT head: Dyna  CT performed in the angiography suite was reconstructed on an independent workstation for review. Images again reveal diffuse basal subarachnoid hemorrhage. In comparison to the preoperative CT scan there does appear to be new hemorrhage within the fourth ventricle and third ventricle. There does not appear to be any significant increase in the amount of subarachnoid hemorrhage. No significant hydrocephalus. DISPOSITION: Upon completion of the study, the femoral sheath was removed and hemostasis obtained by manual compression. Good proximal and distal lower extremity pulses were documented upon achievement of hemostasis. The procedure was well tolerated and no early complications were observed. The patient was transferred to the postanesthesia care unit in stable hemodynamic condition. IMPRESSION: 1. 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 Some contrast filling is seen within the aneurysm post embolization with significant stasis. The preliminary results of this procedure were shared with the patient's family. Electronically Signed   By: Lisbeth Renshaw   On: 05/20/2020 15:56   IR Angiogram Follow Up Study  Result Date: 05/20/2020 PROCEDURE: DIAGNOSTIC CEREBRAL ANGIOGRAM COIL EMBOLIZATION OF RIGHT POSTERIOR COMMUNICATING ARTERY ANEURYSM HISTORY: The patient is a 70 year old woman presenting to the hospital with sudden onset of severe headache and neck pain. CT scan demonstrated diffuse basal subarachnoid hemorrhage with CT angiogram demonstrating  the presence of a right posterior communicating artery aneurysm. Patient therefore presents for further workup with diagnostic cerebral angiogram and possible aneurysm coiling. ACCESS: The technical aspects of the procedure as well as its potential risks and benefits were reviewed with the patient's family. These risks included but were not limited to stroke, intracranial hemorrhage, bleeding, infection, allergic  reaction, damage to organs or vital structures, stroke, non-diagnostic procedure, and the catastrophic outcomes of heart attack, coma, and death. With an understanding of these risks, informed consent was obtained and witnessed. The patient was placed in the supine position on the angiography table and the skin of right groin prepped in the usual sterile fashion. The procedure was performed under general anesthesia. A 5-French sheath was introduced in the right common femoral artery using Seldinger technique. MEDICATIONS: HEPARIN: 0 Units total. CONTRAST:  20mL OMNIPAQUE IOHEXOL 300 MG/ML SOLN, 25mL OMNIPAQUE IOHEXOL 300 MG/ML SOLNcc, Omnipaque 300 FLUOROSCOPY TIME:  FLUOROSCOPY TIME: See IR records TECHNIQUE: CATHETERS AND WIRES 5-French JB-1 glidecatheter 5-French Simmons-2 glidecatheter 180 cm 0.035" glidewire 6-French NeuronMax guide sheath 6-French Berenstein Select JB-1 catheter 0.058" CatV guidecatheter Synchro 2 standard microwire Excelsior XT-17 microcatheter Excelsior XT-27 microcatheter Synchro select standard microwire COILS USED Target 360 XL 8mm x 30cm Target 360 XL 7mm x 20cm VESSELS CATHETERIZED Right internal carotid Left common carotid Left vertebral Right vertebral Right common femoral VESSELS STUDIED Right internal carotid, head Left common carotid, head Left vertebral Right vertebral Right internal carotid artery, head (during embolization) Right internal carotid artery, head (immediate post-embolization) Right internal carotid artery, head (final control) Right common femoral PROCEDURAL NARRATIVE A 5-Fr JB-1 glide catheter was advanced over a 0.035 glidewire into the aortic arch. The right vertebral, right internal carotid, and left vertebral arteries vessels were then sequentially catheterized and cervical / cerebral angiograms taken. After review of images, the catheter was removed without incident. The [facility] 2 catheter was introduced and reformed over the aortic arch. The left common  carotid artery was then catheterized and cerebral angiogram taken. The Simmons catheter was then removed. The 5-Fr sheath was then exchanged over the glidewire for a 25cm 8-Fr sheath. Under real-time fluoroscopy, the guide sheath was advanced over the select catheter and glidewire into the descending aorta. The distal cervical right internal carotid artery was then selected with the wire and the select catheter was then advanced into the proximal cervical right internal carotid artery. The guide sheath was then advanced into the proximal cervical internal carotid artery. The Select catheter was removed and the 058 guide catheter was coaxially introduced over the microcatheter and microwire. The 27 microcatheter was then advanced under roadmap guidance into the middle cerebral artery. The guide catheter was then tracked over the microcatheter to its final position in the distal cavernous segment of the internal carotid. The 27 microcatheter was then removed and the coiling 17 microcatheter introduced over the microwire. The catheter was then navigated into the aneurysm lumen over the microwire. The wire was then removed. The above coils were then deployed with progressive exclusion of the aneurysm. During deployment of the second coil, the last few cm of the coil caused prolapse of the microcatheter into the internal carotid artery. This required multiple attempts to track the microcatheter back over the coil into the aneurysm lumen. Eventually we were able to deployed the remainder of the coil within the aneurysm. Once this aneurysm coil was deployed due to the difficulty to keep the microcatheter within the aneurysm lumen I elected not to attempt to place  further coils. Cerebral angiography was performed immediately post embolization. The coiling catheter was then removed and the guide catheter withdrawn into the cervical internal carotid artery. Final control angiography was then performed from the guide catheter.  The guide catheter and guide sheath were then synchronously removed without incident. FINDINGS: Right internal carotid, head: Injection reveals the presence of a widely patent ICA, M1, and A1 segments and their branches. 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 This measures approximately 9.0 x 7.6 x 7.2 mm. Neck of the aneurysm appears relatively narrow. The parenchymal and venous phases are normal. The venous sinuses are widely patent. Left common carotid, head: Injection reveals the presence of a widely patent ICA, A1, and M1 segments and their branches. No aneurysms, AVMs, or high-flow fistulas are seen. The parenchymal and venous phases are normal. The venous sinuses are widely patent. Left vertebral: Injection reveals the presence of a widely patent vertebral artery. This leads to a widely patent basilar artery that terminates in bilateral P1. Basilar apex is relatively patulous, however no aneurysm is seen. There are no AVMs, or high-flow fistulas seen. The parenchymal and venous phases are normal. The venous sinuses are widely patent. Right vertebral: The vertebral artery is widely patent and essentially terminates as a right PICA. No PICA aneurysm is seen. See basilar description above. Right internal carotid artery, head (during embolization): Injection reveals the presence of a widely patent ICA that leads to a patent ACA and MCA. Coil mass within the aneurysm is stable, without coil prolapse or filling defect to suggest thrombus. Right internal carotid artery, head (immediate post-embolization): Injection reveals the presence of a widely patent ICA that leads to a patent ACA and MCA. Coil mass within the aneurysm is stable, without coil prolapse or filling defect to suggest thrombus. There is some aneurysm filling primarily at the dome of the aneurysm however this is in a delayed fashion. There is significant contrast stasis within this portion of the  aneurysm. Right internal carotid artery, head (final control): Injection reveals the presence of a widely patent ICA that leads to a patent ACA and MCA. No thrombus is visualized. Coil mass is seen within the aneurysm and is in stable position. Again seen is some filling of the aneurysm with significant contrast stasis. There is some contrast seen during the venous phase adjacent to the aneurysm dome which appears to be venous in nature and does not have the appearance of contrast extravasation. Capillary phase does not demonstrate any perfusion deficits. Venous sinuses remain patent. Right femoral: Normal vessel. No significant atherosclerotic disease. Arterial sheath in adequate position. Dyna CT head: Dyna CT performed in the angiography suite was reconstructed on an independent workstation for review. Images again reveal diffuse basal subarachnoid hemorrhage. In comparison to the preoperative CT scan there does appear to be new hemorrhage within the fourth ventricle and third ventricle. There does not appear to be any significant increase in the amount of subarachnoid hemorrhage. No significant hydrocephalus. DISPOSITION: Upon completion of the study, the femoral sheath was removed and hemostasis obtained by manual compression. Good proximal and distal lower extremity pulses were documented upon achievement of hemostasis. The procedure was well tolerated and no early complications were observed. The patient was transferred to the postanesthesia care unit in stable hemodynamic condition. IMPRESSION: 1. 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 Some contrast filling is seen within the aneurysm post embolization with significant stasis. The preliminary results of this  procedure were shared with the patient's family. Electronically Signed   By: Lisbeth Renshaw   On: 05/20/2020 15:56   IR CT Head Ltd  Result Date: 05/20/2020 PROCEDURE: DIAGNOSTIC CEREBRAL  ANGIOGRAM COIL EMBOLIZATION OF RIGHT POSTERIOR COMMUNICATING ARTERY ANEURYSM HISTORY: The patient is a 70 year old woman presenting to the hospital with sudden onset of severe headache and neck pain. 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 Patient therefore presents for further workup with diagnostic cerebral angiogram and possible aneurysm coiling. ACCESS: The technical aspects of the procedure as well as its potential risks and benefits were reviewed with the patient's family. These risks included but were not limited to stroke, intracranial hemorrhage, bleeding, infection, allergic reaction, damage to organs or vital structures, stroke, non-diagnostic procedure, and the catastrophic outcomes of heart attack, coma, and death. With an understanding of these risks, informed consent was obtained and witnessed. The patient was placed in the supine position on the angiography table and the skin of right groin prepped in the usual sterile fashion. The procedure was performed under general anesthesia. A 5-French sheath was introduced in the right common femoral artery using Seldinger technique. MEDICATIONS: HEPARIN: 0 Units total. CONTRAST:  20mL OMNIPAQUE IOHEXOL 300 MG/ML SOLN, 25mL OMNIPAQUE IOHEXOL 300 MG/ML SOLNcc, Omnipaque 300 FLUOROSCOPY TIME:  FLUOROSCOPY TIME: See IR records TECHNIQUE: CATHETERS AND WIRES 5-French JB-1 glidecatheter 5-French Simmons-2 glidecatheter 180 cm 0.035" glidewire 6-French NeuronMax guide sheath 6-French Berenstein Select JB-1 catheter 0.058" CatV guidecatheter Synchro 2 standard microwire Excelsior XT-17 microcatheter Excelsior XT-27 microcatheter Synchro select standard microwire COILS USED Target 360 XL 8mm x 30cm Target 360 XL 7mm x 20cm VESSELS CATHETERIZED Right internal carotid Left common carotid Left vertebral Right vertebral Right common femoral VESSELS STUDIED  Right internal carotid, head Left common carotid, head Left vertebral Right vertebral Right internal carotid artery, head (during embolization) Right internal carotid artery, head (immediate post-embolization) Right internal carotid artery, head (final control) Right common femoral PROCEDURAL NARRATIVE A 5-Fr JB-1 glide catheter was advanced over a 0.035 glidewire into the aortic arch. The right vertebral, right internal carotid, and left vertebral arteries vessels were then sequentially catheterized and cervical / cerebral angiograms taken. After review of images, the catheter was removed without incident. The [facility] 2 catheter was introduced and reformed over the aortic arch. The left common carotid artery was then catheterized and cerebral angiogram taken. The Simmons catheter was then removed. The 5-Fr sheath was then exchanged over the glidewire for a 25cm 8-Fr sheath. Under real-time fluoroscopy, the guide sheath was advanced over the select catheter and glidewire into the descending aorta. The distal cervical right internal carotid artery was then selected with the wire and the select catheter was then advanced into the proximal cervical right internal carotid artery. The guide sheath was then advanced into the proximal cervical internal carotid artery. The Select catheter was removed and the 058 guide catheter was coaxially introduced over the microcatheter and microwire. The 27 microcatheter was then advanced under roadmap guidance into the middle cerebral artery. The guide catheter was then tracked over the microcatheter to its final position in the distal cavernous segment of the internal carotid. The 27 microcatheter was then removed and the coiling 17 microcatheter introduced over the microwire. The catheter was then navigated into the aneurysm lumen over the microwire. The wire was then removed. The above coils were then deployed with progressive exclusion of the aneurysm. During deployment of the  second coil, the last few cm of the coil caused prolapse of the microcatheter into the internal carotid artery. This required multiple attempts to track the microcatheter back over the coil into the aneurysm lumen. Eventually we were able to deployed the remainder of the coil within the aneurysm. Once this aneurysm coil was deployed due to the difficulty to keep the microcatheter within the aneurysm lumen I elected not to attempt to place further coils. Cerebral angiography was performed immediately post embolization. The coiling catheter was then removed and the guide catheter withdrawn into the cervical internal carotid artery. Final control angiography was then performed from the guide catheter. The guide catheter and guide sheath were then synchronously removed without incident. FINDINGS: Right internal carotid, head: Injection reveals the presence of a widely patent ICA, M1, and A1 segments and their branches. 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 This measures approximately 9.0 x 7.6 x 7.2 mm. Neck of the aneurysm appears relatively narrow. The parenchymal and venous phases are normal. The venous sinuses are widely patent. Left common carotid, head: Injection reveals the presence of a widely patent ICA, A1, and M1 segments and their branches. No aneurysms, AVMs, or high-flow fistulas are seen. The parenchymal and venous phases are normal. The venous sinuses are widely patent. Left vertebral: Injection reveals the presence of a widely patent vertebral artery. This leads to a widely patent basilar artery that terminates in bilateral P1. Basilar apex is relatively patulous, however no aneurysm is seen. There are no AVMs, or high-flow fistulas seen. The parenchymal and venous phases are normal. The venous sinuses are widely patent. Right vertebral: The vertebral artery is widely patent and essentially terminates as a right PICA. No PICA aneurysm is seen. See  basilar description above. Right internal carotid artery, head (during embolization): Injection reveals the presence of a widely patent ICA that leads to a patent ACA and MCA. Coil mass within the aneurysm is stable, without coil prolapse or filling defect to suggest thrombus. Right internal carotid artery, head (immediate post-embolization): Injection reveals the presence of a widely patent ICA that leads to a patent ACA and MCA. Coil mass within the aneurysm is stable, without coil prolapse or filling defect to suggest thrombus. There is some aneurysm filling primarily at the dome of the aneurysm however this is in a delayed fashion. There is significant contrast stasis within this portion of the aneurysm. Right internal carotid artery, head (final control): Injection reveals the presence of a widely patent ICA that leads to a patent ACA and MCA. No thrombus is visualized. Coil mass is seen within the aneurysm and is in stable position. Again seen is some filling of the aneurysm with significant contrast stasis. There is some contrast seen during the venous phase adjacent to the aneurysm dome which appears to be venous in nature and does not have the appearance of contrast extravasation. Capillary phase does not demonstrate any perfusion deficits. Venous sinuses remain patent. Right femoral: Normal vessel. No significant atherosclerotic disease. Arterial sheath in adequate position. Dyna CT head: Dyna CT performed in the angiography suite was reconstructed on an independent workstation for review. Images again reveal diffuse basal subarachnoid hemorrhage. In comparison to the preoperative CT scan there does appear to be new hemorrhage within the fourth ventricle and third ventricle. There does not appear to be any significant increase in the amount of subarachnoid hemorrhage. No significant hydrocephalus. DISPOSITION: Upon completion of the study, the femoral sheath was removed and  hemostasis obtained by manual  compression. Good proximal and distal lower extremity pulses were documented upon achievement of hemostasis. The procedure was well tolerated and no early complications were observed. The patient was transferred to the postanesthesia care unit in stable hemodynamic condition. IMPRESSION: 1. 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 Some contrast filling is seen within the aneurysm post embolization with significant stasis. The preliminary results of this procedure were shared with the patient's family. Electronically Signed   By: Lisbeth Renshaw   On: 05/20/2020 15:56   VAS [place] TRANSCRANIAL DOPPLER  Result Date: 05/21/2020  Transcranial Doppler Indications: Subarachnoid hemorrhage. Comparison Study: No prior studies. Performing Technologist: Jean Rosenthal RDMS,RVT  Examination Guidelines: A complete evaluation includes B-mode imaging, spectral Doppler, color Doppler, and power Doppler as needed of all accessible portions of each vessel. Bilateral testing is considered an integral part of a complete examination. Limited examinations for reoccurring indications may be performed as noted.  +----------+-------------+----------+-----------+-------------------+ RIGHT TCD Right VM (cm)Depth (cm)Pulsatility      Comment       +----------+-------------+----------+-----------+-------------------+ MCA                                         Unable to visualize +----------+-------------+----------+-----------+-------------------+ ACA           27.00       5.50      1.05                        +----------+-------------+----------+-----------+-------------------+ Term ICA      32.00       6.80      1.52                        +----------+-------------+----------+-----------+-------------------+ PCA           31.00       5.80      1.36                        +----------+-------------+----------+-----------+-------------------+ Opthalmic     25.00        3.60      1.65                        +----------+-------------+----------+-----------+-------------------+ ICA siphon    34.00       5.00      1.73                        +----------+-------------+----------+-----------+-------------------+ Vertebral     20.00       6.00      1.17                        +----------+-------------+----------+-----------+-------------------+  +----------+------------+----------+-----------+-------+ LEFT TCD  Left VM (cm)Depth (cm)PulsatilityComment +----------+------------+----------+-----------+-------+ MCA          50.00       4.60      1.31            +----------+------------+----------+-----------+-------+ ACA          25.00       5.10      1.24            +----------+------------+----------+-----------+-------+ Term ICA     36.00  5.40      1.56            +----------+------------+----------+-----------+-------+ PCA          26.00       5.70      1.35            +----------+------------+----------+-----------+-------+ Opthalmic    25.00       3.40      1.48            +----------+------------+----------+-----------+-------+ ICA siphon   33.00       5.60      1.31            +----------+------------+----------+-----------+-------+ Vertebral    20.00       5.70      1.21            +----------+------------+----------+-----------+-------+ Distal ICA   24.00                 1.26            +----------+------------+----------+-----------+-------+  +------------+-------+-------+             VM cm/sComment +------------+-------+-------+ Prox Basilar 23.00         +------------+-------+-------+ +---------------------+----+ Left Lindegaard Ratio2.08 +---------------------+----+    Preliminary    IR ANGIO INTRA EXTRACRAN SEL INTERNAL CAROTID BILAT MOD SED  Result Date: 05/20/2020 PROCEDURE: DIAGNOSTIC CEREBRAL ANGIOGRAM COIL EMBOLIZATION OF RIGHT POSTERIOR COMMUNICATING ARTERY ANEURYSM HISTORY:  The patient is a 70 year old woman presenting to the hospital with sudden onset of severe headache and neck pain. 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 Patient therefore presents for further workup with diagnostic cerebral angiogram and possible aneurysm coiling. ACCESS: The technical aspects of the procedure as well as its potential risks and benefits were reviewed with the patient's family. These risks included but were not limited to stroke, intracranial hemorrhage, bleeding, infection, allergic reaction, damage to organs or vital structures, stroke, non-diagnostic procedure, and the catastrophic outcomes of heart attack, coma, and death. With an understanding of these risks, informed consent was obtained and witnessed. The patient was placed in the supine position on the angiography table and the skin of right groin prepped in the usual sterile fashion. The procedure was performed under general anesthesia. A 5-French sheath was introduced in the right common femoral artery using Seldinger technique. MEDICATIONS: HEPARIN: 0 Units total. CONTRAST:  20mL OMNIPAQUE IOHEXOL 300 MG/ML SOLN, 25mL OMNIPAQUE IOHEXOL 300 MG/ML SOLNcc, Omnipaque 300 FLUOROSCOPY TIME:  FLUOROSCOPY TIME: See IR records TECHNIQUE: CATHETERS AND WIRES 5-French JB-1 glidecatheter 5-French Simmons-2 glidecatheter 180 cm 0.035" glidewire 6-French NeuronMax guide sheath 6-French Berenstein Select JB-1 catheter 0.058" CatV guidecatheter Synchro 2 standard microwire Excelsior XT-17 microcatheter Excelsior XT-27 microcatheter Synchro select standard microwire COILS USED Target 360 XL 8mm x 30cm Target 360 XL 7mm x 20cm VESSELS CATHETERIZED Right internal carotid Left common carotid Left vertebral Right vertebral Right common femoral VESSELS STUDIED Right internal carotid, head Left common carotid, head Left vertebral Right vertebral  Right internal carotid artery, head (during embolization) Right internal carotid artery, head (immediate post-embolization) Right internal carotid artery, head (final control) Right common femoral PROCEDURAL NARRATIVE A 5-Fr JB-1 glide catheter was advanced over a 0.035 glidewire into the aortic arch. The right vertebral, right internal carotid, and left vertebral arteries vessels were then sequentially catheterized and cervical / cerebral angiograms taken. After review of images, the catheter was removed without incident. The [employer]  2 catheter was introduced and reformed over the aortic arch. The left common carotid artery was then catheterized and cerebral angiogram taken. The Simmons catheter was then removed. The 5-Fr sheath was then exchanged over the glidewire for a 25cm 8-Fr sheath. Under real-time fluoroscopy, the guide sheath was advanced over the select catheter and glidewire into the descending aorta. The distal cervical right internal carotid artery was then selected with the wire and the select catheter was then advanced into the proximal cervical right internal carotid artery. The guide sheath was then advanced into the proximal cervical internal carotid artery. The Select catheter was removed and the 058 guide catheter was coaxially introduced over the microcatheter and microwire. The 27 microcatheter was then advanced under roadmap guidance into the middle cerebral artery. The guide catheter was then tracked over the microcatheter to its final position in the distal cavernous segment of the internal carotid. The 27 microcatheter was then removed and the coiling 17 microcatheter introduced over the microwire. The catheter was then navigated into the aneurysm lumen over the microwire. The wire was then removed. The above coils were then deployed with progressive exclusion of the aneurysm. During deployment of the second coil, the last few cm of the coil caused prolapse of the microcatheter into the  internal carotid artery. This required multiple attempts to track the microcatheter back over the coil into the aneurysm lumen. Eventually we were able to deployed the remainder of the coil within the aneurysm. Once this aneurysm coil was deployed due to the difficulty to keep the microcatheter within the aneurysm lumen I elected not to attempt to place further coils. Cerebral angiography was performed immediately post embolization. The coiling catheter was then removed and the guide catheter withdrawn into the cervical internal carotid artery. Final control angiography was then performed from the guide catheter. The guide catheter and guide sheath were then synchronously removed without incident. FINDINGS: Right internal carotid, head: Injection reveals the presence of a widely patent ICA, M1, and A1 segments and their branches. 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 This measures approximately 9.0 x 7.6 x 7.2 mm. Neck of the aneurysm appears relatively narrow. The parenchymal and venous phases are normal. The venous sinuses are widely patent. Left common carotid, head: Injection reveals the presence of a widely patent ICA, A1, and M1 segments and their branches. No aneurysms, AVMs, or high-flow fistulas are seen. The parenchymal and venous phases are normal. The venous sinuses are widely patent. Left vertebral: Injection reveals the presence of a widely patent vertebral artery. This leads to a widely patent basilar artery that terminates in bilateral P1. Basilar apex is relatively patulous, however no aneurysm is seen. There are no AVMs, or high-flow fistulas seen. The parenchymal and venous phases are normal. The venous sinuses are widely patent. Right vertebral: The vertebral artery is widely patent and essentially terminates as a right PICA. No PICA aneurysm is seen. See basilar description above. Right internal carotid artery, head (during embolization):  Injection reveals the presence of a widely patent ICA that leads to a patent ACA and MCA. Coil mass within the aneurysm is stable, without coil prolapse or filling defect to suggest thrombus. Right internal carotid artery, head (immediate post-embolization): Injection reveals the presence of a widely patent ICA that leads to a patent ACA and MCA. Coil mass within the aneurysm is stable, without coil prolapse or filling defect to suggest thrombus. There is some aneurysm filling primarily at the dome  of the aneurysm however this is in a delayed fashion. There is significant contrast stasis within this portion of the aneurysm. Right internal carotid artery, head (final control): Injection reveals the presence of a widely patent ICA that leads to a patent ACA and MCA. No thrombus is visualized. Coil mass is seen within the aneurysm and is in stable position. Again seen is some filling of the aneurysm with significant contrast stasis. There is some contrast seen during the venous phase adjacent to the aneurysm dome which appears to be venous in nature and does not have the appearance of contrast extravasation. Capillary phase does not demonstrate any perfusion deficits. Venous sinuses remain patent. Right femoral: Normal vessel. No significant atherosclerotic disease. Arterial sheath in adequate position. Dyna CT head: Dyna CT performed in the angiography suite was reconstructed on an independent workstation for review. Images again reveal diffuse basal subarachnoid hemorrhage. In comparison to the preoperative CT scan there does appear to be new hemorrhage within the fourth ventricle and third ventricle. There does not appear to be any significant increase in the amount of subarachnoid hemorrhage. No significant hydrocephalus. DISPOSITION: Upon completion of the study, the femoral sheath was removed and hemostasis obtained by manual compression. Good proximal and distal lower extremity pulses were documented upon  achievement of hemostasis. The procedure was well tolerated and no early complications were observed. The patient was transferred to the postanesthesia care unit in stable hemodynamic condition. IMPRESSION: 1. 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 Some contrast filling is seen within the aneurysm post embolization with significant stasis. The preliminary results of this procedure were shared with the patient's family. Electronically Signed   By: Lisbeth Renshaw   On: 05/20/2020 15:56   IR ANGIO VERTEBRAL SEL VERTEBRAL UNI R MOD SED  Result Date: 05/20/2020 PROCEDURE: DIAGNOSTIC CEREBRAL ANGIOGRAM COIL EMBOLIZATION OF RIGHT POSTERIOR COMMUNICATING ARTERY ANEURYSM HISTORY: The patient is a 70 year old woman presenting to the hospital with sudden onset of severe headache and neck pain. 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 Patient therefore presents for further workup with diagnostic cerebral angiogram and possible aneurysm coiling. ACCESS: The technical aspects of the procedure as well as its potential risks and benefits were reviewed with the patient's family. These risks included but were not limited to stroke, intracranial hemorrhage, bleeding, infection, allergic reaction, damage to organs or vital structures, stroke, non-diagnostic procedure, and the catastrophic outcomes of heart attack, coma, and death. With an understanding of these risks, informed consent was obtained and witnessed. The patient was placed in the supine position on the angiography table and the skin of right groin prepped in the usual sterile fashion. The procedure was performed under general anesthesia. A 5-French sheath was introduced in the right common femoral artery using Seldinger technique. MEDICATIONS: HEPARIN: 0 Units total. CONTRAST:  20mL OMNIPAQUE IOHEXOL  300 MG/ML SOLN, 25mL OMNIPAQUE IOHEXOL 300 MG/ML SOLNcc, Omnipaque 300 FLUOROSCOPY TIME:  FLUOROSCOPY TIME: See IR records TECHNIQUE: CATHETERS AND WIRES 5-French JB-1 glidecatheter 5-French Simmons-2 glidecatheter 180 cm 0.035" glidewire 6-French NeuronMax guide sheath 6-French Berenstein Select JB-1 catheter 0.058" CatV guidecatheter Synchro 2 standard microwire Excelsior XT-17 microcatheter Excelsior XT-27 microcatheter Synchro select standard microwire COILS USED Target 360 XL 8mm x 30cm Target 360 XL 7mm x 20cm VESSELS CATHETERIZED Right internal carotid Left common carotid Left vertebral Right vertebral Right common femoral VESSELS STUDIED Right internal carotid, head Left common carotid, head  Left vertebral Right vertebral Right internal carotid artery, head (during embolization) Right internal carotid artery, head (immediate post-embolization) Right internal carotid artery, head (final control) Right common femoral PROCEDURAL NARRATIVE A 5-Fr JB-1 glide catheter was advanced over a 0.035 glidewire into the aortic arch. The right vertebral, right internal carotid, and left vertebral arteries vessels were then sequentially catheterized and cervical / cerebral angiograms taken. After review of images, the catheter was removed without incident. The [facility] 2 catheter was introduced and reformed over the aortic arch. The left common carotid artery was then catheterized and cerebral angiogram taken. The Simmons catheter was then removed. The 5-Fr sheath was then exchanged over the glidewire for a 25cm 8-Fr sheath. Under real-time fluoroscopy, the guide sheath was advanced over the select catheter and glidewire into the descending aorta. The distal cervical right internal carotid artery was then selected with the wire and the select catheter was then advanced into the proximal cervical right internal carotid artery. The guide sheath was then advanced into the proximal cervical internal carotid artery. The Select  catheter was removed and the 058 guide catheter was coaxially introduced over the microcatheter and microwire. The 27 microcatheter was then advanced under roadmap guidance into the middle cerebral artery. The guide catheter was then tracked over the microcatheter to its final position in the distal cavernous segment of the internal carotid. The 27 microcatheter was then removed and the coiling 17 microcatheter introduced over the microwire. The catheter was then navigated into the aneurysm lumen over the microwire. The wire was then removed. The above coils were then deployed with progressive exclusion of the aneurysm. During deployment of the second coil, the last few cm of the coil caused prolapse of the microcatheter into the internal carotid artery. This required multiple attempts to track the microcatheter back over the coil into the aneurysm lumen. Eventually we were able to deployed the remainder of the coil within the aneurysm. Once this aneurysm coil was deployed due to the difficulty to keep the microcatheter within the aneurysm lumen I elected not to attempt to place further coils. Cerebral angiography was performed immediately post embolization. The coiling catheter was then removed and the guide catheter withdrawn into the cervical internal carotid artery. Final control angiography was then performed from the guide catheter. The guide catheter and guide sheath were then synchronously removed without incident. FINDINGS: Right internal carotid, head: Injection reveals the presence of a widely patent ICA, M1, and A1 segments and their branches. 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 This measures approximately 9.0 x 7.6 x 7.2 mm. Neck of the aneurysm appears relatively narrow. The parenchymal and venous phases are normal. The venous sinuses are widely patent. Left common carotid, head: Injection reveals the presence of a widely patent ICA, A1, and M1  segments and their branches. No aneurysms, AVMs, or high-flow fistulas are seen. The parenchymal and venous phases are normal. The venous sinuses are widely patent. Left vertebral: Injection reveals the presence of a widely patent vertebral artery. This leads to a widely patent basilar artery that terminates in bilateral P1. Basilar apex is relatively patulous, however no aneurysm is seen. There are no AVMs, or high-flow fistulas seen. The parenchymal and venous phases are normal. The venous sinuses are widely patent. Right vertebral: The vertebral artery is widely patent and essentially terminates as a right PICA. No PICA aneurysm is seen. See basilar description above. Right internal carotid artery, head (during embolization): Injection reveals the presence  of a widely patent ICA that leads to a patent ACA and MCA. Coil mass within the aneurysm is stable, without coil prolapse or filling defect to suggest thrombus. Right internal carotid artery, head (immediate post-embolization): Injection reveals the presence of a widely patent ICA that leads to a patent ACA and MCA. Coil mass within the aneurysm is stable, without coil prolapse or filling defect to suggest thrombus. There is some aneurysm filling primarily at the dome of the aneurysm however this is in a delayed fashion. There is significant contrast stasis within this portion of the aneurysm. Right internal carotid artery, head (final control): Injection reveals the presence of a widely patent ICA that leads to a patent ACA and MCA. No thrombus is visualized. Coil mass is seen within the aneurysm and is in stable position. Again seen is some filling of the aneurysm with significant contrast stasis. There is some contrast seen during the venous phase adjacent to the aneurysm dome which appears to be venous in nature and does not have the appearance of contrast extravasation. Capillary phase does not demonstrate any perfusion deficits. Venous sinuses remain  patent. Right femoral: Normal vessel. No significant atherosclerotic disease. Arterial sheath in adequate position. Dyna CT head: Dyna CT performed in the angiography suite was reconstructed on an independent workstation for review. Images again reveal diffuse basal subarachnoid hemorrhage. In comparison to the preoperative CT scan there does appear to be new hemorrhage within the fourth ventricle and third ventricle. There does not appear to be any significant increase in the amount of subarachnoid hemorrhage. No significant hydrocephalus. DISPOSITION: Upon completion of the study, the femoral sheath was removed and hemostasis obtained by manual compression. Good proximal and distal lower extremity pulses were documented upon achievement of hemostasis. The procedure was well tolerated and no early complications were observed. The patient was transferred to the postanesthesia care unit in stable hemodynamic condition. IMPRESSION: 1. 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 Some contrast filling is seen within the aneurysm post embolization with significant stasis. The preliminary results of this procedure were shared with the patient's family. Electronically Signed   By: Lisbeth Renshaw   On: 05/20/2020 15:56    Assessment/Plan: Postop day 2 coiling of right P-comm aneurysm.  Follow-up CT scan ordered because of somnolence showed mild to moderate hydrocephalus however patient's clinical exam did improve throughout the night she is wide-awake.  I do not recommend extraventricular drainage and conscious patient is awake so we will continue to follow this along.  Will order follow-up CT scan for tomorrow morning.  LOS: 2 days     Mariam Dollar 05/22/2020, 8:34 AM

## 2020-05-22 NOTE — Progress Notes (Signed)
NAME:  XIAMARA HULET, MRN:  [id number], DOB:  1950/06/20, LOS: 2 ADMISSION DATE:  05/20/2020, CONSULTATION DATE: 05/21/2020 REFERRING MD: Dr. Conchita Paris, CHIEF COMPLAINT: Headache and neck pain  Brief History:  70 year old female with COPD and recent NSTEMI who presented with sudden onset of headache and neck pain for few days, noted to have subarachnoid hemorrhage H/H 2, MF 3 due to ruptured right P-comm aneurysm, status post coiling  Past Medical History:   Past Medical History:  Diagnosis Date  . AAA (abdominal aortic aneurysm) (HCC)    AAA  . COPD (chronic obstructive pulmonary disease) (HCC)   . Dyspnea    with exertion  . Pure hyperglyceridemia      Significant Hospital Events:    Consults:  Neurosurgery PCCM  Procedures:  3/10 angiogram with coiling of right P-comm aneurysm  Significant Diagnostic Tests:  3/10 CT head: Subarachnoid hemorrhage 3/10 CT angiogram of head and neck: Ruptured right P-comm aneurysm 3/11 CT head: Diffuse areas of high attenuation throughout the bilateral sulci, likely a combination of residual subarachnoid hemorrhage and postprocedural contrast staining. 2. Persistent subarachnoid hemorrhage in the suprasellar region, with intraventricular hemorrhage in the bilateral lateral ventricles and third ventricles as above. 3. Mild hydrocephalus which has developed in the interim. 4. Developing hypodensities within the right insula and frontotemporal regions, likely representing acute cortical infarct. 5. Diffuse hypodensities throughout the periventricular white matter, likely representing an element of transependymal CSF flow and chronic small vessel ischemic change  Micro Data:  3/10 MRSA PCR negative 3/10 Covid and influenza PCR negative  Antimicrobials:     Interim History / Subjective:  Patient became lethargic last night, stat CT head was performed which showed slight hydrocephalus and IVH and redistribution of subarachnoid  hemorrhage, then patient woke up and started following commands.  Remains afebrile Objective   Blood pressure 139/63, pulse 80, temperature 97.9 F (36.6 C), temperature source Axillary, resp. rate 14, height 5\' 4"  (1.626 m), weight 37.8 kg, SpO2 99 %.        Intake/Output Summary (Last 24 hours) at 05/22/2020 0925 Last data filed at 05/22/2020 0800 Gross per 24 hour  Intake 2075.77 ml  Output 1200 ml  Net 875.77 ml   Filed Weights   05/20/20 0920  Weight: 37.8 kg    Examination:  General: Chronically ill-appearing elderly female, lying on the bed HEENT: [place]/AT, eyes anicteric.    Dry mucous membranes, no JVD Neuro: Lethargic, opens eyes with vocal stimuli, forgetful, antigravity in all 4 extremities.  Pupils are equal round react light Chest: Coarse breath sounds, no wheezes or rhonchi Heart: Regular rate and rhythm, no murmurs or gallops Abdomen: Soft, nontender, nondistended, bowel sounds present Skin: No rash  Resolved Hospital Problem list     Assessment & Plan:  Aneurysmal subarachnoid hemorrhage H/H 2, MF 3 due to ruptured P-comm aneurysm status post coiling Continue neuro check every hour Maintain normal natremia and euvolemia Daily TCD's Neurosurgery input is appreciated Continue monitor pain Started on Keppra for seizure prophylaxis to complete 7 days therapy  Mild to moderate hydrocephalus CT head which was repeated last night showing mild to moderate hydrocephalus due to [facility] Neurosurgery recommend not to proceed with EVD as of now as patient's exam is improved Continue to monitor closely  Chronic systolic congestive heart failure with EF 20 to 25% Continue Coreg and losartan Monitor intake and output  COPD, not in exacerbation Continue nebs  Dementia Continue supportive care  Best practice (evaluated daily)  Diet: Dysphagia  level 1 Pain/Anxiety/Delirium protocol (if indicated): N/A VAP protocol (if indicated): N/A DVT prophylaxis: SCDs GI  prophylaxis: Famotidine Glucose control: SSI Mobility: As tolerated Disposition: Full code  Goals of Care:  Last date of multidisciplinary goals of care discussion: Per primary team Code Status: Full code  Labs   CBC: Recent Labs  Lab 05/16/20 0230 05/20/20 1044 05/20/20 1055  WBC 9.1 9.4  --   NEUTROABS  --  7.4  --   HGB 11.4* 12.0 11.9*  HCT 33.6* 35.8* 35.0*  MCV 93.6 95.0  --   PLT 143* 151  --     Basic Metabolic Panel: Recent Labs  Lab 05/16/20 0230 05/20/20 1044 05/20/20 1055  NA 135 132* 135  K 3.9 3.7 3.7  CL 104 102 100  CO2 22 25  --   GLUCOSE 92 93 85  BUN 25* 17 21  CREATININE 0.82 0.83 0.70  CALCIUM 8.9 8.4*  --    GFR: Estimated Creatinine Clearance: 38.5 mL/min (by C-G formula based on SCr of 0.7 mg/dL). Recent Labs  Lab 05/16/20 0230 05/20/20 1044  WBC 9.1 9.4    Liver Function Tests: Recent Labs  Lab 05/20/20 1044  AST 26  ALT 17  ALKPHOS 57  BILITOT 0.5  PROT 5.5*  ALBUMIN 3.0*   No results for input(s): LIPASE, AMYLASE in the last 168 hours. No results for input(s): AMMONIA in the last 168 hours.  ABG    Component Value Date/Time   PHART 7.412 05/14/2020 1347   PCO2ART 36.8 05/14/2020 1347   PO2ART 82 (L) 05/14/2020 1347   HCO3 23.7 05/14/2020 1354   TCO2 26 05/20/2020 1055   ACIDBASEDEF 1.0 05/14/2020 1354   O2SAT 67.0 05/14/2020 1354     Coagulation Profile: Recent Labs  Lab 05/20/20 1044 05/20/20 1836  INR 1.2 1.2    Cardiac Enzymes: No results for input(s): CKTOTAL, CKMB, CKMBINDEX, TROPONINI in the last 168 hours.  HbA1C: Hgb A1c MFr Bld  Date/Time Value Ref Range Status  05/14/2020 02:40 AM 5.8 (H) 4.8 - 5.6 % Final    Comment:    (NOTE) Pre diabetes:          5.7%-6.4%  Diabetes:              >6.4%  Glycemic control for   <7.0% adults with diabetes     CBG: No results for input(s): GLUCAP in the last 168 hours.   Past Medical History:  She,  has a past medical history of AAA (abdominal  aortic aneurysm) (HCC), COPD (chronic obstructive pulmonary disease) (HCC), Dyspnea, and Pure hyperglyceridemia.   Surgical History:   Past Surgical History:  Procedure Laterality Date  . ABDOMINAL AORTIC ANEURYSM REPAIR N/A 07/24/2016   Procedure: ABDOMINAL AORTIC ANEURYSM  REPAIR;  Surgeon: Sherren Kerns, MD;  Location: [facility] OR;  Service: Vascular;  Laterality: N/A;  . cardiac stenting  2007  . ENDARTERECTOMY Left 07/24/2016   Procedure: AORTA TO LEFT ILIAC ENDARTERECTOMY;  Surgeon: Sherren Kerns, MD;  Location: [facility] OR;  Service: Vascular;  Laterality: Left;  . ENDARTERECTOMY FEMORAL Right 07/24/2016   Procedure: AORTA TO RIGHT FEMORAL ENDARTERECTOMY;  Surgeon: Sherren Kerns, MD;  Location: [facility] OR;  Service: Vascular;  Laterality: Right;  . IR ANGIO INTRA EXTRACRAN SEL INTERNAL CAROTID BILAT MOD SED  05/20/2020  . IR ANGIO VERTEBRAL SEL VERTEBRAL UNI R MOD SED  05/20/2020  . IR ANGIOGRAM FOLLOW UP STUDY  05/20/2020  . IR ANGIOGRAM FOLLOW UP STUDY  05/20/2020  . IR CT HEAD LTD  05/20/2020  . IR TRANSCATH/EMBOLIZ  05/20/2020  . RADIOLOGY WITH ANESTHESIA N/A 05/20/2020   Procedure: IR WITH ANESTHESIA - ANEURYSM;  Surgeon: Lisbeth Renshaw, MD;  Location: [facility] OR;  Service: Radiology;  Laterality: N/A;  . RIGHT/LEFT HEART CATH AND CORONARY ANGIOGRAPHY N/A 05/14/2020   Procedure: RIGHT/LEFT HEART CATH AND CORONARY ANGIOGRAPHY;  Surgeon: Tonny Bollman, MD;  Location: [facility] INVASIVE CV LAB;  Service: Cardiovascular;  Laterality: N/A;  . TOE SURGERY Left    3 rd toe Hammer toe     Social History:   reports that she has been smoking cigarettes. She has a 30.00 pack-year smoking history. She has never used smokeless tobacco. She reports that she does not drink alcohol and does not use drugs.   Family History:  Her family history includes Stroke in her mother.   Allergies Allergies  Allergen Reactions  . No Known Allergies      Home Medications  Prior to Admission medications   Medication  Sig Start Date End Date Taking? Authorizing Provider  albuterol (PROVENTIL HFA;VENTOLIN HFA) 108 (90 Base) MCG/ACT inhaler Inhale 2 puffs into the lungs every 6 (six) hours as needed for wheezing or shortness of breath.   Yes [provider]  albuterol (PROVENTIL) (2.5 MG/3ML) 0.083% nebulizer solution Take 3 mLs (2.5 mg total) by nebulization every 6 (six) hours as needed for wheezing or shortness of breath. 11/21/19  Yes Hunsucker, Lesia Sago, MD  BREO ELLIPTA 100-25 MCG/INH AEPB Inhale 1 puff into the lungs daily. 04/16/20  Yes [provider]  carvedilol (COREG) 3.125 MG tablet Take 1 tablet (3.125 mg total) by mouth 2 (two) times daily with a meal. 05/16/20  Yes Zannie Cove, MD  ibuprofen (ADVIL,MOTRIN) 200 MG tablet Take 800 mg by mouth every 6 (six) hours as needed for mild pain.   Yes [provider]  losartan (COZAAR) 25 MG tablet Take 0.5 tablets (12.5 mg total) by mouth daily. 05/16/20  Yes Zannie Cove, MD  Multiple Vitamin (MULTIVITAMIN WITH MINERALS) TABS tablet Take 1 tablet by mouth daily.   Yes [provider]  simvastatin (ZOCOR) 20 MG tablet Take 20 mg by mouth every morning.    Yes [provider]  Tiotropium Bromide Monohydrate 2.5 MCG/ACT AERS Inhale 2 puffs into the lungs daily.   Yes [provider]  polyethylene glycol (MIRALAX / GLYCOLAX) packet Take 17 g by mouth daily. Patient not taking: No sig reported 06/23/16   Forest Becker, MD     Total critical care time: 35 minutes  Performed by: Cheri Fowler   Critical care time was exclusive of separately billable procedures and treating other patients.   Critical care was necessary to treat or prevent imminent or life-threatening deterioration.   Critical care was time spent personally by me on the following activities: development of treatment plan with patient and/or surrogate as well as nursing, discussions with consultants, evaluation of patient's response to  treatment, examination of patient, obtaining history from patient or surrogate, ordering and performing treatments and interventions, ordering and review of laboratory studies, ordering and review of radiographic studies, pulse oximetry and re-evaluation of patient's condition.   Cheri Fowler MD [facility] See Amion for pager If no response to pager, please call [phone] until 7pm After 7pm, Please call E-link [phone]

## 2020-05-22 NOTE — Progress Notes (Signed)
eLink Physician-Brief Progress Note Patient Name: Alexandra Mays DOB: [birth year] MRN: [id number]   Date of Service  05/22/2020  HPI/Events of Note  Patient attempting to get out of bed and presenting a fall risk.  eICU Interventions  Posey belt restraint ordered for patient safety.        Thomasene Lot [name] 05/22/2020, 5:35 AM

## 2020-05-22 NOTE — Progress Notes (Signed)
eLink Physician-Brief Progress Note Patient Name: BRANDI TOMLINSON DOB: 1950/05/14 MRN: [id number]   Date of Service  05/22/2020  HPI/Events of Note  Patient agitated. In speaking with RN, it seems like this may be stemming from undertreated pain.   eICU Interventions  Ordered morphine 2mg  IV Q3H PRN.     Intervention Category Intermediate Interventions: Pain - evaluation and management  05/22/2020, 11:58 PM

## 2020-05-22 NOTE — Progress Notes (Signed)
2000: On assessment patient appears to have altered mental as they are disoriented x4 intermittently following commands and lethargic/ barely opening eyes to voice.Patient unable to take po nimotop as there is concern for aspiration due to lethargy. CCM notifed stat head CT ordered patient made NPO.  2300:  Neurosurgery made aware of patients status. Wants to continue giving nimotop order placed for NG tube.  0000. On assessment patient is more alert oriented to person with some agitation. Patient is now able to tolerate PO administration of nimoptop.

## 2020-05-22 NOTE — Consult Note (Signed)
Physical Medicine and Rehabilitation Consult Reason for Consult: Decreased functional ability with headache and dizziness Referring Physician: Dr. Conchita Paris   HPI: Alexandra Mays is a 70 y.o. right-handed female with history of COPD/tobacco abuse, abdominal aortic aneurysm repair 07/24/2016, left carotid enterectomy 07/24/2016.  Per chart review patient reportedly independent prior to admission and still drives living with family members.  Her daughter does report some short-term memory deficits.  Presented 05/20/2020 with severe headache, neck pain with waxing and waning of mental status.  CT angiogram of head and neck showed acute subarachnoid hemorrhage predominantly on the right side.  Mild amount of blood in the ventricles without hydrocephalus.  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  No other aneurysm.  Patient underwent diagnostic cerebral angiogram, coil embolization of right posterior communicating artery aneurysm 05/20/2020 per Dr. Conchita Paris.  Latest follow-up cranial CT scan shows diffuse areas of high attenuation throughout the bilateral foci, likely a combination of residual subarachnoid hemorrhage and post procedural contrast staining.  Persistent subarachnoid hemorrhage in the suprasellar region, with intraventricular hemorrhage in the bilateral lateral ventricles and third ventricles and mild hydrocephalus.  Patient initially maintained on Keppra for seizure prophylaxis.  Intravenous Cardene for blood pressure control transitioned to Nimotop.  Dysphagia #1 honey thick liquid diet.  Bouts of urinary retention placed on Urecholine.  Therapy evaluations completed due to patient decreased functional mobility recommendations of physical medicine rehab consult   Review of Systems  Constitutional: Negative for chills and fever.  HENT: Negative for hearing loss.   Eyes: Negative for blurred vision and double vision.  Respiratory:  Negative for cough.        Shortness of breath with exertion  Cardiovascular: Positive for leg swelling. Negative for chest pain and palpitations.  Gastrointestinal: Positive for constipation. Negative for heartburn, nausea and vomiting.  Genitourinary: Negative for dysuria, flank pain and hematuria.  Musculoskeletal: Positive for joint pain, myalgias and neck pain.  Skin: Negative for rash.  Neurological: Positive for dizziness, weakness and headaches.  Psychiatric/Behavioral: Positive for memory loss.  All other systems reviewed and are negative.  Past Medical History:  Diagnosis Date  . AAA (abdominal aortic aneurysm) (HCC)    AAA  . COPD (chronic obstructive pulmonary disease) (HCC)   . Dyspnea    with exertion  . Pure hyperglyceridemia    Past Surgical History:  Procedure Laterality Date  . ABDOMINAL AORTIC ANEURYSM REPAIR N/A 07/24/2016   Procedure: ABDOMINAL AORTIC ANEURYSM  REPAIR;  Surgeon: Sherren Kerns, MD;  Location: [facility] OR;  Service: Vascular;  Laterality: N/A;  . cardiac stenting  2007  . ENDARTERECTOMY Left 07/24/2016   Procedure: AORTA TO LEFT ILIAC ENDARTERECTOMY;  Surgeon: Sherren Kerns, MD;  Location: [facility] OR;  Service: Vascular;  Laterality: Left;  . ENDARTERECTOMY FEMORAL Right 07/24/2016   Procedure: AORTA TO RIGHT FEMORAL ENDARTERECTOMY;  Surgeon: Sherren Kerns, MD;  Location: [facility] OR;  Service: Vascular;  Laterality: Right;  . IR ANGIO INTRA EXTRACRAN SEL INTERNAL CAROTID BILAT MOD SED  05/20/2020  . IR ANGIO VERTEBRAL SEL VERTEBRAL UNI R MOD SED  05/20/2020  . IR ANGIOGRAM FOLLOW UP STUDY  05/20/2020  . IR ANGIOGRAM FOLLOW UP STUDY  05/20/2020  . IR CT HEAD LTD  05/20/2020  . IR TRANSCATH/EMBOLIZ  05/20/2020  . RADIOLOGY WITH ANESTHESIA N/A 05/20/2020   Procedure: IR WITH ANESTHESIA - ANEURYSM;  Surgeon: Lisbeth Renshaw, MD;  Location: [facility] OR;  Service: Radiology;  Laterality: N/A;  . RIGHT/LEFT HEART CATH AND CORONARY ANGIOGRAPHY N/A 05/14/2020    Procedure: RIGHT/LEFT HEART CATH AND CORONARY ANGIOGRAPHY;  Surgeon: Tonny Bollmanooper, Michael, MD;  [facility];  Service: Cardiovascular;  Laterality: N/A;  . TOE SURGERY Left    3 rd toe Hammer toe   Family History  Problem Relation Age of Onset  . Stroke Mother    Social History:  reports that she has been smoking cigarettes. She has a 30.00 pack-year smoking history. She has never used smokeless tobacco. She reports that she does not drink alcohol and does not use drugs. Allergies:  Allergies  Allergen Reactions  . No Known Allergies    Medications Prior to Admission  Medication Sig Dispense Refill  . albuterol (PROVENTIL HFA;VENTOLIN HFA) 108 (90 Base) MCG/ACT inhaler Inhale 2 puffs into the lungs every 6 (six) hours as needed for wheezing or shortness of breath.    Marland Kitchen. albuterol (PROVENTIL) (2.5 MG/3ML) 0.083% nebulizer solution Take 3 mLs (2.5 mg total) by nebulization every 6 (six) hours as needed for wheezing or shortness of breath. 120 mL 5  . BREO ELLIPTA 100-25 MCG/INH AEPB Inhale 1 puff into the lungs daily.    . carvedilol (COREG) 3.125 MG tablet Take 1 tablet (3.125 mg total) by mouth 2 (two) times daily with a meal. 60 tablet 0  . ibuprofen (ADVIL,MOTRIN) 200 MG tablet Take 800 mg by mouth every 6 (six) hours as needed for mild pain.    Marland Kitchen. losartan (COZAAR) 25 MG tablet Take 0.5 tablets (12.5 mg total) by mouth daily. 30 tablet 0  . Multiple Vitamin (MULTIVITAMIN WITH MINERALS) TABS tablet Take 1 tablet by mouth daily.    . [EXPIRED] predniSONE (DELTASONE) 20 MG tablet Take 1 tablet (20 mg total) by mouth daily with breakfast for 3 days. 3 tablet 0  . simvastatin (ZOCOR) 20 MG tablet Take 20 mg by mouth every morning.     . Tiotropium Bromide Monohydrate 2.5 MCG/ACT AERS Inhale 2 puffs into the lungs daily.    . polyethylene glycol (MIRALAX / GLYCOLAX) packet Take 17 g by mouth daily. (Patient not taking: No sig reported) 14 each 0    Home: Home  Living Family/patient expects to be discharged to:: Private residence Living Arrangements: Other relatives Available Help at Discharge: Family,Available 24 hours/day Type of Home: House Home Access: Stairs to enter Entergy CorporationEntrance Stairs-Number of Steps: 3 Entrance Stairs-Rails:  (rail in the middle) Home Layout: Two level,Able to live on main level with bedroom/bathroom Bathroom Shower/Tub: Engineer, manufacturing systemsTub/shower unit Bathroom Toilet: Standard Bathroom Accessibility: Yes Home Equipment: Grab bars - tub/shower Additional Comments: information obtained from PT evaluation as no family present during OT eval and poor historian from patient  Lives With: Family  Functional History: Prior Function Level of Independence: Independent Comments: Pt's daughter reports pt has some STM deficits, confusing dates and times at baseline; drives herself and handles her own finances; needs assistance with keeping appointments and with medications; independent with all functional mobility without AD/AE, gets SOB with stairs though Functional Status:  Mobility: Bed Mobility Overal bed mobility: Needs Assistance Bed Mobility: Rolling,Supine to Sit,Sit to Supine Rolling: Total assist Sidelying to sit: Total assist Supine to sit: Total assist Sit to supine: Total assist General bed mobility comments: requires total (A) with all aspects. pt observed pulling herself into long sitting but does not initiate on command Transfers Overall transfer level: Needs assistance Equipment used: 1 person hand held assist Transfers: Sit to/from Stand Sit to Stand:  Max assist General transfer comment: dependent on OT for elevation and pivot ot chair. pt does not initiate task. pt in full extension with with transfer. Ambulation/Gait Ambulation/Gait assistance: Mod assist Gait Distance (Feet): 10 Feet Assistive device: 1 person hand held assist Gait Pattern/deviations: Step-through pattern,Decreased step length - right,Decreased step  length - left,Decreased stride length,Decreased weight shift to right,Decreased weight shift to left,Shuffle,Narrow base of support General Gait Details: Pt with eyes closed despite cues. Requiring 1-2 UE support on PT anterior to pt and physical assist for steadying and to weight shift bil and advance R leg with all gait. Pt tends to try to avoid leaning over to R leg despite cues. ModA for safety and for cuing. Poor bil feet clearance. Gait velocity: reduced Gait velocity interpretation: <1.31 ft/sec, indicative of household ambulator    ADL: ADL Overall ADL's : Needs assistance/impaired Eating/Feeding Details (indicate cue type and reason): declines food present in room Toilet Transfer: Maximal assistance,BSC,Stand-pivot Toilet Transfer Details (indicate cue type and reason): pt with posterior lean and full body extension. pt attempting to exit the bed but unable to stand need to void or agree that voiding is required. pt when sat on potty automatic in voiding Toileting- Clothing Manipulation and Hygiene: Total assistance General ADL Comments: pt attempting to exit the bed with posey on arrival with no verbalization to needs. pt toileted / placed in side lying with hOB elevated and pillow between needs posey don and comfortable. pt now resting and all restless behavior has resolved  Cognition: Cognition Overall Cognitive Status: Impaired/Different from baseline Arousal/Alertness: Lethargic Orientation Level: Oriented to person,Oriented to place Attention: Focused Focused Attention: Impaired Focused Attention Impairment: Verbal basic Memory: Impaired Memory Impairment: Storage deficit Awareness: Impaired Awareness Impairment: Intellectual impairment Cognition Arousal/Alertness: Awake/alert Behavior During Therapy: Restless Overall Cognitive Status: Impaired/Different from baseline Area of Impairment: Orientation,Memory Orientation Level: Disoriented  to,Person,Place,Time,Situation Current Attention Level: Focused Memory: Decreased recall of precautions,Decreased short-term memory Following Commands: Follows one step commands inconsistently,Follows one step commands with increased time Safety/Judgement: Decreased awareness of safety,Decreased awareness of deficits Awareness: Intellectual Problem Solving: Slow processing,Decreased initiation,Difficulty sequencing,Requires verbal cues,Requires tactile cues General Comments: pt reports its 1900 something. pt does acknowledge she goes by "[name]"  Blood pressure 129/65, pulse (!) 47, temperature 97.7 F (36.5 C), temperature source Axillary, resp. rate 14, height 5\' 4"  (1.626 m), weight 37.8 kg, SpO2 96 %. Physical Exam Vitals and nursing note reviewed.  Constitutional:      Appearance: She is ill-appearing.  HENT:     Head: Normocephalic and atraumatic.  Eyes:     General: No visual field deficit. Cardiovascular:     Rate and Rhythm: Bradycardia present.     Heart sounds: Normal heart sounds. No murmur heard.   Pulmonary:     Effort: Pulmonary effort is normal. No respiratory distress.     Breath sounds: Normal breath sounds.  Abdominal:     General: Bowel sounds are normal. There is no distension.     Palpations: Abdomen is soft.  Musculoskeletal:        General: No tenderness.     Cervical back: Normal range of motion and neck supple.  Skin:    General: Skin is warm and dry.     Comments: Ecchymosis bilateral arms  Neurological:     Mental Status: She is alert. She is confused.     GCS: GCS eye subscore is 4. GCS verbal subscore is 5. GCS motor subscore is 6.     Cranial  Nerves: No dysarthria or facial asymmetry.     Sensory: No sensory deficit.     Motor: No weakness.     Gait: Gait abnormal.     Comments: Patient lethargic but arousable.  Oriented to person and hospital not date or time.  According to daughter patient had difficulty with time even prior to subarachnoid  bleed complaints of headache.  Follows some simple commands.  Psychiatric:        Attention and Perception: She is inattentive.        Mood and Affect: Affect is blunt.        Speech: Speech is delayed.        Behavior: Behavior is slowed and withdrawn.        Cognition and Memory: Cognition is impaired. Memory is impaired.     No results found for this or any previous visit (from the past 24 hour(s)). CT HEAD WO CONTRAST  Result Date: 05/23/2020 CLINICAL DATA:  Subarachnoid hemorrhage follow EXAM: CT HEAD WITHOUT CONTRAST TECHNIQUE: Contiguous axial images were obtained from the base of the skull through the vertex without intravenous contrast. COMPARISON:  None. FINDINGS: Brain: Unchanged distribution of subarachnoid hemorrhage over both hemispheres. Blood in the posterolateral ventricles is unchanged. 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 Unchanged size and configuration of the lateral ventricles. Vascular: Atherosclerotic calcification of the internal carotid arteries at the skull base. No abnormal hyperdensity of the major intracranial arteries or dural venous sinuses. Skull: The visualized skull base, calvarium and extracranial soft tissues are normal. Sinuses/Orbits: No fluid levels or advanced mucosal thickening of the visualized paranasal sinuses. No mastoid or middle ear effusion. The orbits are normal. IMPRESSION: Unchanged distribution of subarachnoid hemorrhage over both hemispheres with intraventricular extension. Electronically Signed   By: Deatra Robinson M.D.   On: 05/23/2020 03:40   DG Abd Portable 1V  Result Date: 05/23/2020 CLINICAL DATA:  NG tube placement EXAM: PORTABLE ABDOMEN - 1 VIEW COMPARISON:  None. FINDINGS: Esophagogastric tube is positioned with tip and side port below the diaphragm. Nonobstructive pattern of partially included bowel gas. IMPRESSION: Esophagogastric tube is positioned with tip and side port below the diaphragm. Electronically Signed   By: Lauralyn Primes M.D.   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 Does the need for close, 24 hr/day medical supervision in concert with the patient's rehab needs make it unreasonable for this patient to be served in a less intensive setting? Yes 2. Co-Morbidities requiring supervision/potential complications: Chronic malnourishment, history of COPD, cerebrovascular disease status post left carotid endarterectomy, history of AAA repair 3. Due to bladder management, bowel management, safety, skin/wound care, disease management, medication administration, pain management and patient education, does the patient require 24 hr/day rehab nursing? Yes 4. Does the patient require coordinated care of a physician, rehab nurse, therapy disciplines of PT, OT, speech to address physical and functional deficits in the context of the above medical diagnosis(es)? Yes Addressing deficits in the following areas: balance, endurance, locomotion, strength, transferring, bowel/bladder control, bathing, dressing, feeding, toileting and psychosocial support 5. Can the patient actively participate in an intensive therapy program of at least 3 hrs of therapy per day at least 5 days per week? Yes 6. The potential for patient to make measurable gains while on inpatient rehab is good 7. Anticipated functional outcomes upon discharge from inpatient rehab are min assist  with PT, min assist with OT, min assist with SLP.  8. Estimated rehab length of stay to reach the above functional goals is: 23 to 27 days 9. Anticipated discharge destination: Home 10. Overall Rehab/Functional Prognosis: good  RECOMMENDATIONS: This patient's condition is appropriate for continued rehabilitative care in the following setting: CIR Patient has agreed to participate in recommended program. N/A Note that insurance prior authorization may be required for reimbursement  for recommended care.  Comment: Need to monitor rehab tolerance, currently somnolent but hope that she will be able to fully participate in several days.   Mcarthur Rossetti Angiulli, PA-C 05/24/2020   "I have personally performed a face to face diagnostic evaluation of this patient.  Additionally, I have reviewed and concur with the physician assistant's documentation above." Erick Colace M.D. [facility] Fellow Am Acad of [employer] Med and Rehab Diplomate Am Board of Electrodiagnostic Med Fellow Am Board of Interventional Pain

## 2020-05-22 NOTE — Evaluation (Signed)
Occupational Therapy Evaluation Patient Details Name: Alexandra Mays MRN: [id number] DOB: March 09, 1951 Today's Date: 05/22/2020    History of Present Illness 70 y.o. female who presented 05/20/20 with R neck pain and a headache. 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 Pt recently discharged from hospital 05/16/20 with NSTEMI. 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 PMH: dyspnea, COPD, AAA, pure hyperglyceridemia.   Clinical Impression   Patient is s/p aneurysm coiling surgery resulting in functional limitations due to the deficits listed below (see OT problem list). Pt does not verbalize  Needs this session only moaning. Pt will need to increase participation to meet d/c at supervision level by 10 days. Pt could decrease burden of care with admission with family care s/p d/c.   Patient will benefit from skilled OT acutely to increase independence and safety with ADLS to allow discharge CIR.     Follow Up Recommendations  CIR    Equipment Recommendations  3 in 1 bedside commode    Recommendations for Other Services Rehab consult     Precautions / Restrictions Precautions Precautions: Fall      Mobility Bed Mobility Overal bed mobility: Needs Assistance Bed Mobility: Rolling;Supine to Sit;Sit to Supine Rolling: Total assist Sidelying to sit: Total assist Supine to sit: Total assist Sit to supine: Total assist   General bed mobility comments: requires total (A) with all aspects. pt observed pulling herself into long sitting but does not initiate on command    Transfers Overall transfer level: Needs assistance Equipment used: 1 person hand held assist Transfers: Sit to/from Stand Sit to Stand: Max assist         General transfer comment: dependent on OT for elevation and pivot ot chair. pt does not initiate task. pt in full extension  with with transfer.    Balance Overall balance assessment: Needs assistance Sitting-balance support: Bilateral upper extremity supported;Feet supported Sitting balance-Leahy Scale: Zero     Standing balance support: During functional activity Standing balance-Leahy Scale: Zero                             ADL either performed or assessed with clinical judgement   ADL Overall ADL's : Needs assistance/impaired   Eating/Feeding Details (indicate cue type and reason): declines food present in room                     Toilet Transfer: Maximal assistance;BSC;Stand-pivot Toilet Transfer Details (indicate cue type and reason): pt with posterior lean and full body extension. pt attempting to exit the bed but unable to stand need to void or agree that voiding is required. pt when sat on potty automatic in voiding Toileting- Clothing Manipulation and Hygiene: Total assistance         General ADL Comments: pt attempting to exit the bed with posey on arrival with no verbalization to needs. pt toileted / placed in side lying with hOB elevated and pillow between needs posey don and comfortable. pt now resting and all restless behavior has resolved     Vision Baseline Vision/History: Wears glasses Additional Comments: unable to assess due to not follow commands and closing eyes at times     Perception     Praxis      Pertinent Vitals/Pain Pain Assessment: Faces Faces Pain Scale: Hurts whole lot Pain Location: generalized with mobility Pain  Descriptors / Indicators: Discomfort;Grimacing;Guarding;Moaning Pain Intervention(s): Monitored during session;Premedicated before session;Repositioned     Hand Dominance Right   Extremity/Trunk Assessment Upper Extremity Assessment Upper Extremity Assessment: Generalized weakness;Difficult to assess due to impaired cognition   Lower Extremity Assessment Lower Extremity Assessment: Defer to PT evaluation   Cervical / Trunk  Assessment Cervical / Trunk Assessment: Other exceptions;Kyphotic (decr rotation and c/o neck pain)   Communication Communication Communication: No difficulties   Cognition Arousal/Alertness: Awake/alert Behavior During Therapy: Restless Overall Cognitive Status: Impaired/Different from baseline Area of Impairment: Orientation;Memory                 Orientation Level: Disoriented to;Person;Place;Time;Situation Current Attention Level: Focused Memory: Decreased recall of precautions;Decreased short-term memory         General Comments: pt reports its 1900 something. pt does acknowledge she goes by "Alexandra Mays"   General Comments  VSS 02 [place]    Exercises     Shoulder Instructions      Home Living Family/patient expects to be discharged to:: Private residence Living Arrangements: Other relatives Available Help at Discharge: Family;Available 24 hours/day Type of Home: House Home Access: Stairs to enter [employer] of Steps: 3   Home Layout: Two level;Able to live on main level with bedroom/bathroom     Bathroom Shower/Tub: Chief Strategy Officer: Standard Bathroom Accessibility: Yes How Accessible: Accessible via walker     Additional Comments: information obtained from PT evaluation as no family present during OT eval and poor historian from patient  Lives With: Family    Prior Functioning/Environment Level of Independence: Independent        Comments: Pt's daughter reports pt has some STM deficits, confusing dates and times at baseline; drives herself and handles her own finances; needs assistance with keeping appointments and with medications; independent with all functional mobility without AD/AE, gets SOB with stairs though        OT Problem List: Decreased strength;Decreased activity tolerance;Impaired balance (sitting and/or standing);Impaired vision/perception;Decreased coordination;Decreased cognition;Decreased safety  awareness;Decreased knowledge of use of DME or AE;Decreased knowledge of precautions      OT Treatment/Interventions: Self-care/ADL training;Therapeutic exercise;Neuromuscular education;Energy conservation;DME and/or AE instruction;Manual therapy;Therapeutic activities;Cognitive remediation/compensation;Visual/perceptual remediation/compensation;Patient/family education;Balance training    OT Goals(Current goals can be found in the care plan section) Acute Rehab OT Goals Patient Stated Goal: none stated only moan OT Goal Formulation: Patient unable to participate in goal setting Potential to Achieve Goals: Fair  OT Frequency: Min 2X/week   Barriers to D/C:            Co-evaluation              AM-PAC OT "6 Clicks" Daily Activity     Outcome Measure Help from another person eating meals?: A Lot Help from another person taking care of personal grooming?: A Lot Help from another person toileting, which includes using toliet, bedpan, or urinal?: Total Help from another person bathing (including washing, rinsing, drying)?: Total Help from another person to put on and taking off regular upper body clothing?: Total Help from another person to put on and taking off regular lower body clothing?: Total 6 Click Score: 8   End of Session Equipment Utilized During Treatment: Gait belt Nurse Communication: Mobility status;Precautions  Activity Tolerance: Patient tolerated treatment well Patient left: in bed;with call bell/phone within reach;with bed alarm set;with restraints reapplied  OT Visit Diagnosis: Unsteadiness on feet (R26.81);Muscle weakness (generalized) (M62.81)  Time: 1400-1420 OT Time Calculation (min): 20 min Charges:  OT General Charges $OT Visit: 1 Visit OT Evaluation $OT Eval Moderate Complexity: 1 Mod   Brynn, OTR/L  Acute Rehabilitation Services Pager: [phone] Office: [phone] .   Mateo Flow 05/22/2020, 3:43 PM

## 2020-05-23 ENCOUNTER — Inpatient Hospital Stay (HOSPITAL_COMMUNITY): Payer: Medicare Other

## 2020-05-23 DIAGNOSIS — E871 Hypo-osmolality and hyponatremia: Secondary | ICD-10-CM

## 2020-05-23 DIAGNOSIS — E876 Hypokalemia: Secondary | ICD-10-CM | POA: Diagnosis not present

## 2020-05-23 DIAGNOSIS — I608 Other nontraumatic subarachnoid hemorrhage: Secondary | ICD-10-CM | POA: Diagnosis not present

## 2020-05-23 DIAGNOSIS — I5022 Chronic systolic (congestive) heart failure: Secondary | ICD-10-CM | POA: Diagnosis not present

## 2020-05-23 LAB — CBC
HCT: 36.6 % (ref 36.0–46.0)
Hemoglobin: 12.9 g/dL (ref 12.0–15.0)
MCH: 31 pg (ref 26.0–34.0)
MCHC: 35.2 g/dL (ref 30.0–36.0)
MCV: 88 fL (ref 80.0–100.0)
Platelets: 205 10*3/uL (ref 150–400)
RBC: 4.16 MIL/uL (ref 3.87–5.11)
RDW: 12.5 % (ref 11.5–15.5)
WBC: 12.8 10*3/uL — ABNORMAL HIGH (ref 4.0–10.5)
nRBC: 0 % (ref 0.0–0.2)

## 2020-05-23 LAB — BASIC METABOLIC PANEL
Anion gap: 10 (ref 5–15)
BUN: 14 mg/dL (ref 8–23)
CO2: 21 mmol/L — ABNORMAL LOW (ref 22–32)
Calcium: 8.5 mg/dL — ABNORMAL LOW (ref 8.9–10.3)
Chloride: 96 mmol/L — ABNORMAL LOW (ref 98–111)
Creatinine, Ser: 0.55 mg/dL (ref 0.44–1.00)
GFR, Estimated: >60 mL/min (ref >60)
Glucose, Bld: 90 mg/dL (ref 70–99)
Potassium: 2.9 mmol/L — ABNORMAL LOW (ref 3.5–5.1)
Sodium: 127 mmol/L — ABNORMAL LOW (ref 135–145)

## 2020-05-23 LAB — PHOSPHORUS: Phosphorus: 2.2 mg/dL — ABNORMAL LOW (ref 2.5–4.6)

## 2020-05-23 LAB — MAGNESIUM: Magnesium: 1.8 mg/dL (ref 1.7–2.4)

## 2020-05-23 IMAGING — CT CT HEAD W/O CM
5 series · 17 of 47 positions shown, 19 images · non-contrast
Comparison: None.

CLINICAL DATA: Subarachnoid hemorrhage follow

EXAM:
CT HEAD WITHOUT CONTRAST
TECHNIQUE: Contiguous axial images were obtained from the base of the skull
through the vertex without intravenous contrast.

[Series 3: head without · axial · non-contrast · 0.41mm/px · z∈[-73,+17]mm · 4 of 31 slices shown]
[im 7/31  brain]
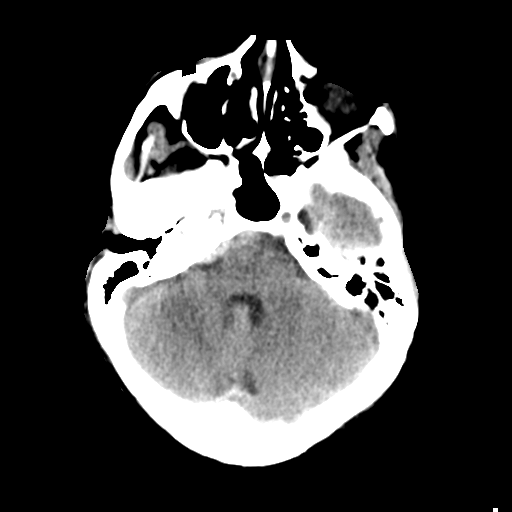
[im 13/31  brain]
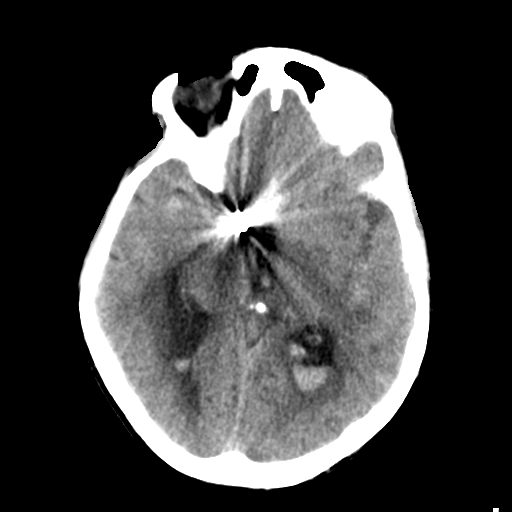
[im 19/31  brain]
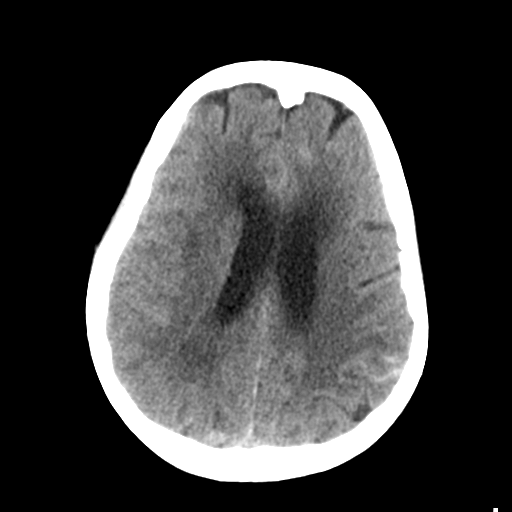
[im 25/31  brain]
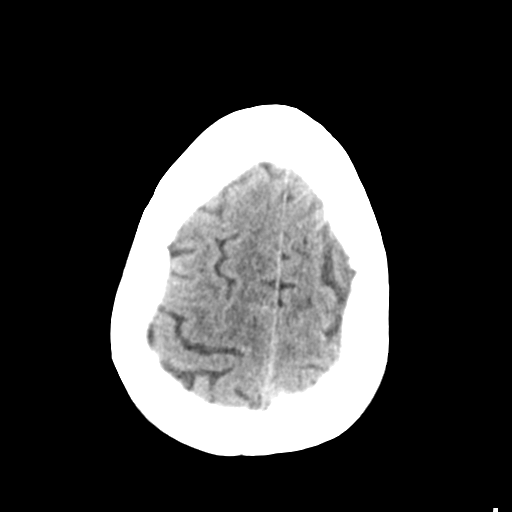

[Series 4: head bone · axial · 0.41mm/px · z∈[-95,-75]mm · 2 of 76 slices shown]
[im 5/76  bone]
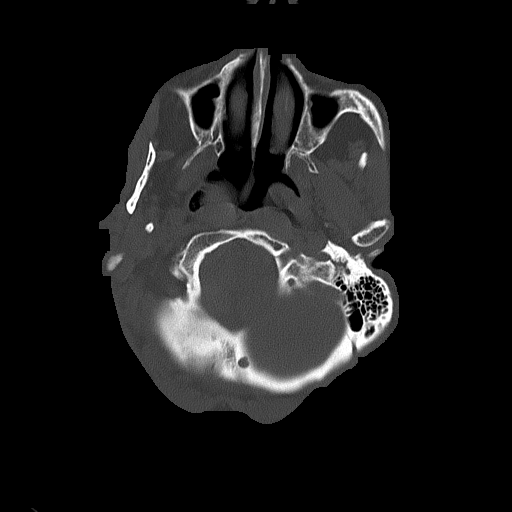
[im 15/76  bone]
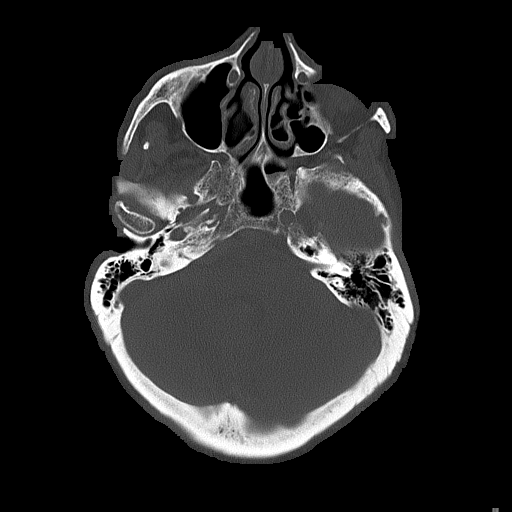

[Series 5: head without (person_name) · axial · non-contrast · 0.41mm/px · z∈[-78,+22]mm · 5 of 31 slices shown, 7 images]
[im 6/31  brain]
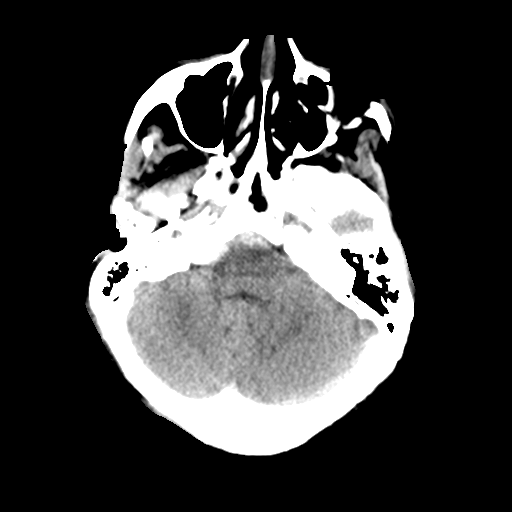
[im 6/31  bone]
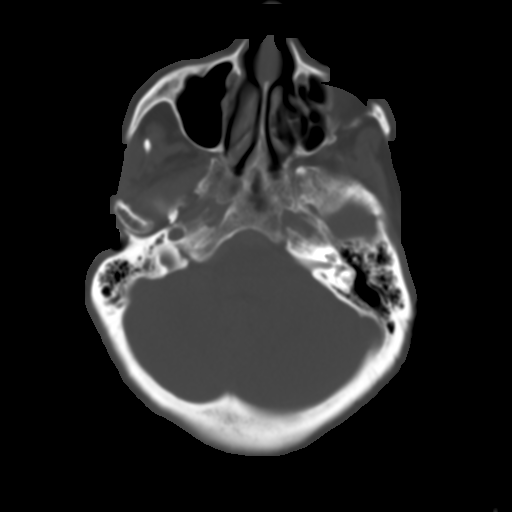
[im 11/31  brain]
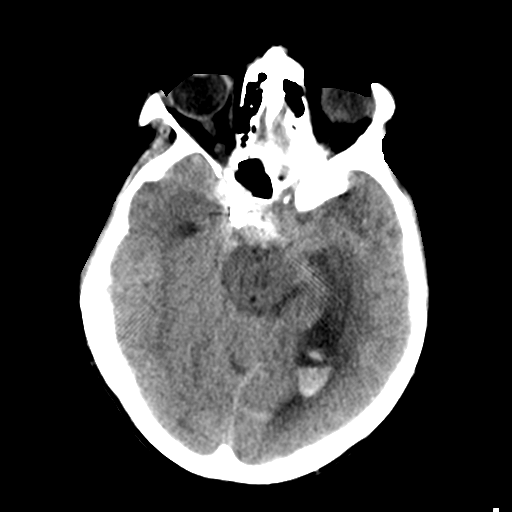
[im 16/31  brain]
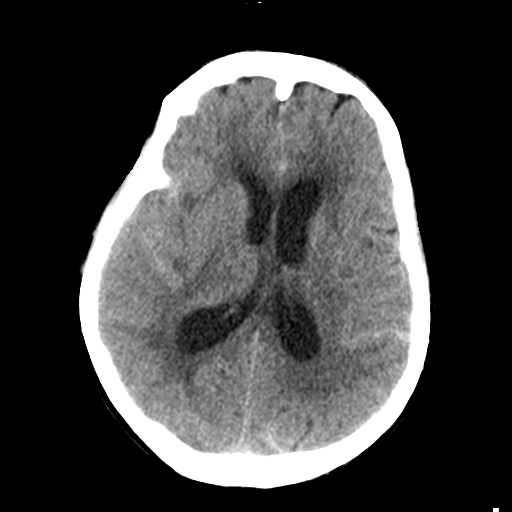
[im 21/31  brain]
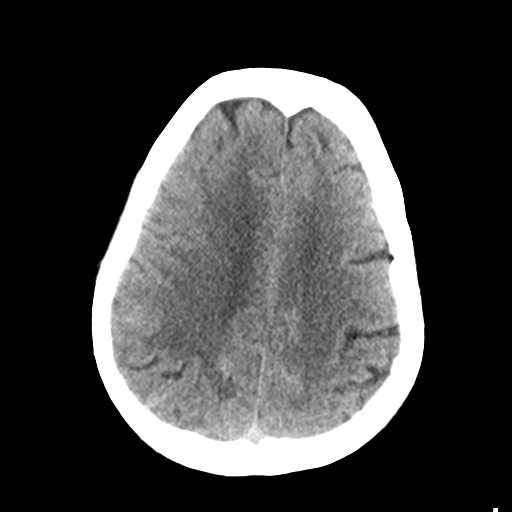
[im 26/31  brain]
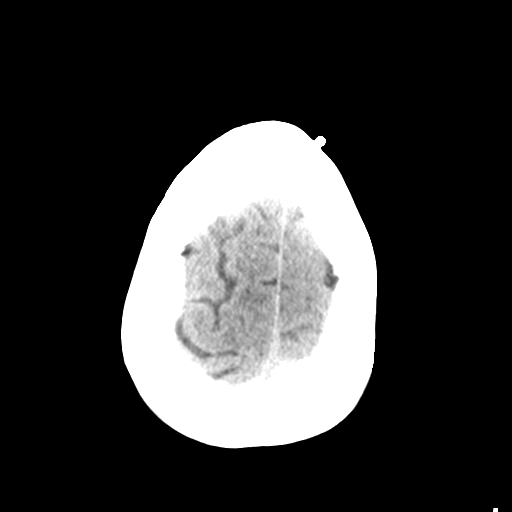
[im 26/31  bone]
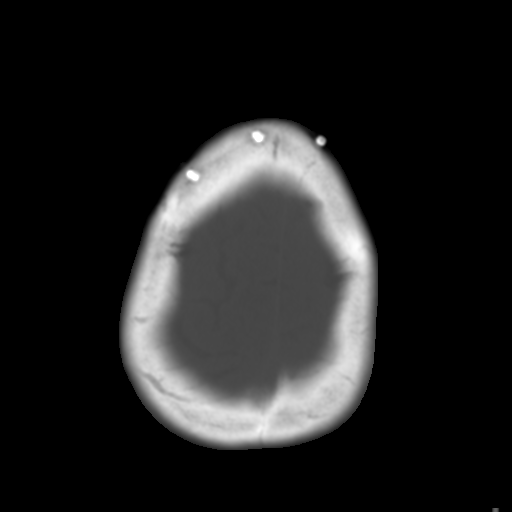

[Series 6: head without cor · coronal · non-contrast · 0.34mm/px · 3 of 67 slices shown]
[im 23/67  brain]
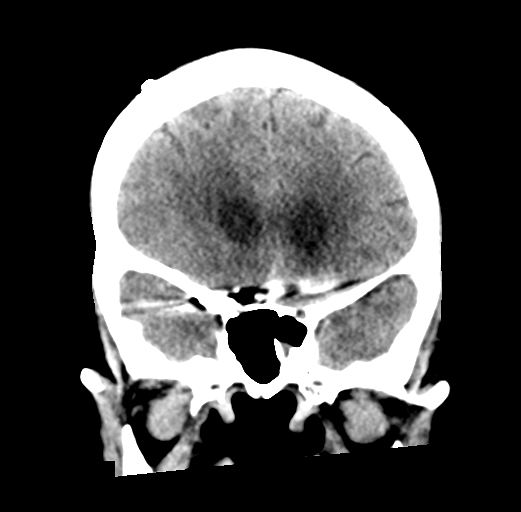
[im 30/67  brain]
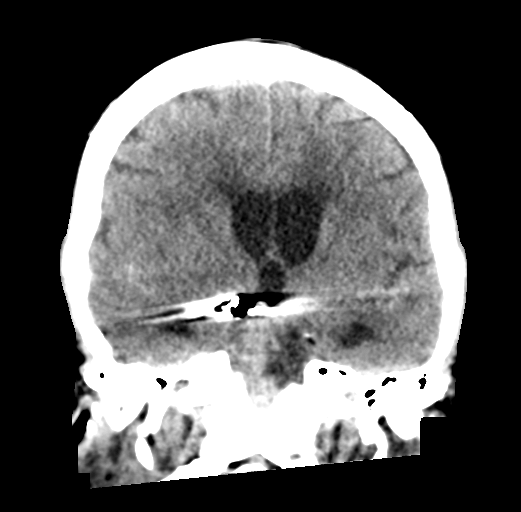
[im 37/67  brain]
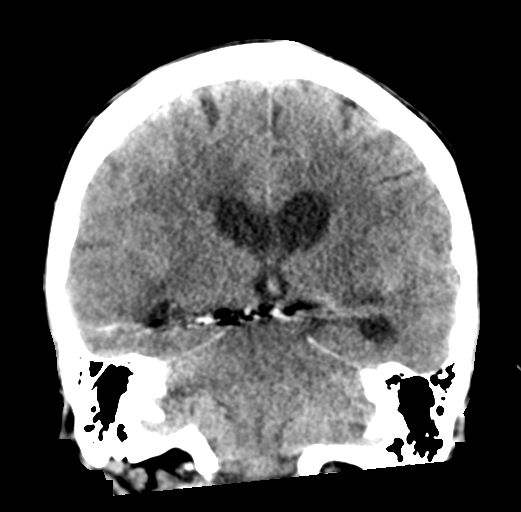

[Series 7: head without sag · sagittal · non-contrast · 0.34mm/px · 3 of 58 slices shown]
[im 20/58  brain]
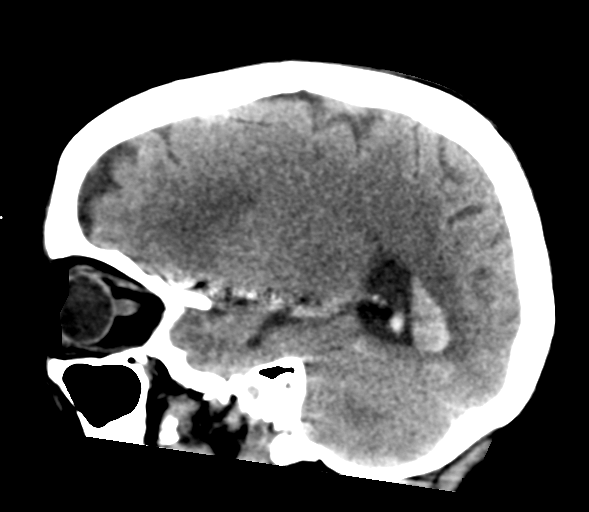
[im 29/58  brain]
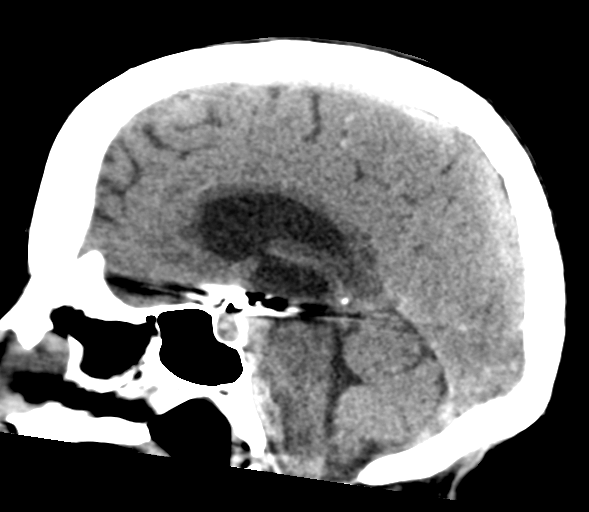
[im 39/58  brain]
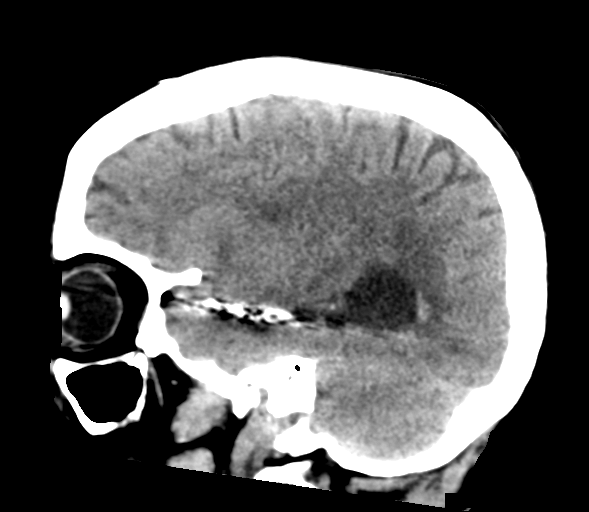

[17 of 47 positions shown; findings below may reference images not displayed]

FINDINGS: Brain: Unchanged distribution of subarachnoid hemorrhage over both
hemispheres. Blood in the posterolateral ventricles is unchanged.
Right paraclinoid coil mass is unchanged. Unchanged size and
configuration of the lateral ventricles.

Vascular: Atherosclerotic calcification of the internal carotid
arteries at the skull base. No abnormal hyperdensity of the major
intracranial arteries or dural venous sinuses.

Skull: The visualized skull base, calvarium and extracranial soft
tissues are normal.

Sinuses/Orbits: No fluid levels or advanced mucosal thickening of
the visualized paranasal sinuses. No mastoid or middle ear effusion.
The orbits are normal.
IMPRESSION: Unchanged distribution of subarachnoid hemorrhage over both
hemispheres with intraventricular extension.

## 2020-05-23 IMAGING — DX DG ABD PORTABLE 1V
1 series · 1 of 1 positions shown · non-contrast
Comparison: None.

CLINICAL DATA: NG tube placement

EXAM:
PORTABLE ABDOMEN - 1 VIEW

[abdomen]
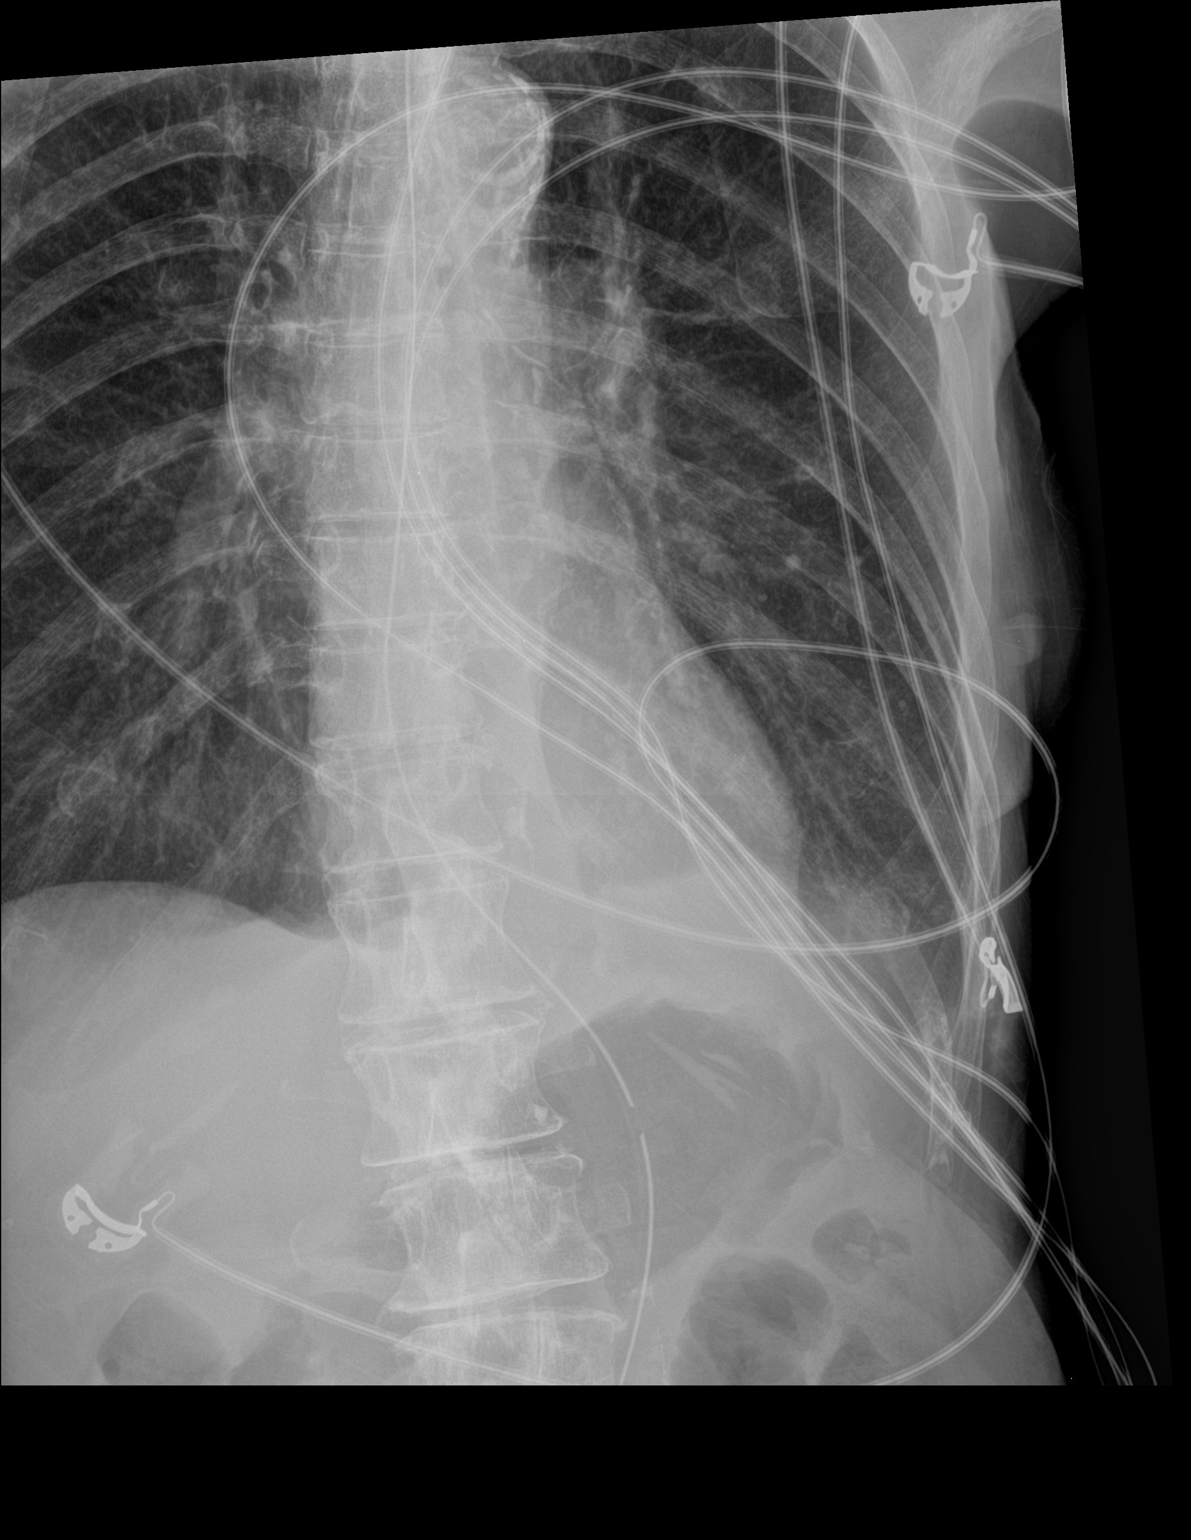

[1 of 1 positions shown; findings below may reference images not displayed]

FINDINGS: Esophagogastric tube is positioned with tip and side port below the
diaphragm. Nonobstructive pattern of partially included bowel gas.
IMPRESSION: Esophagogastric tube is positioned with tip and side port below the
diaphragm.

## 2020-05-23 MED ORDER — SODIUM CHLORIDE 1 G PO TABS
2.0000 g | ORAL_TABLET | Freq: Two times a day (BID) | ORAL | Status: DC
  Administered 2020-05-23 – 2020-06-03 (×24): 2 g
  Filled 2020-05-23 (×24): qty 2

## 2020-05-23 MED ORDER — CARVEDILOL 3.125 MG PO TABS
3.1250 mg | ORAL_TABLET | Freq: Two times a day (BID) | ORAL | Status: DC
  Administered 2020-05-23 (×2): 3.125 mg
  Filled 2020-05-23: qty 1

## 2020-05-23 MED ORDER — LEVETIRACETAM 100 MG/ML PO SOLN
500.0000 mg | Freq: Two times a day (BID) | ORAL | Status: DC
  Administered 2020-05-23 – 2020-05-28 (×11): 500 mg
  Filled 2020-05-23 (×10): qty 5

## 2020-05-23 MED ORDER — SIMVASTATIN 20 MG PO TABS: 20.0000 mg | ORAL_TABLET | Freq: Every day | ORAL | Status: DC

## 2020-05-23 MED ORDER — PANTOPRAZOLE SODIUM 40 MG PO PACK
40.0000 mg | PACK | Freq: Every day | ORAL | Status: DC
  Administered 2020-05-23 – 2020-06-03 (×12): 40 mg
  Filled 2020-05-23 (×11): qty 20

## 2020-05-23 MED ORDER — MELATONIN 3 MG PO TABS: 3.0000 mg | ORAL_TABLET | Freq: Every day | ORAL | Status: DC

## 2020-05-23 MED ORDER — MELATONIN 3 MG PO TABS
3.0000 mg | ORAL_TABLET | Freq: Every day | ORAL | Status: DC
  Administered 2020-05-23 – 2020-06-03 (×12): 3 mg
  Filled 2020-05-23 (×14): qty 1

## 2020-05-23 MED ORDER — POTASSIUM CHLORIDE 10 MEQ/100ML IV SOLN
10.0000 meq | INTRAVENOUS | Status: AC
  Administered 2020-05-23 (×6): 10 meq via INTRAVENOUS
  Filled 2020-05-23 (×7): qty 100

## 2020-05-23 MED ORDER — LOSARTAN POTASSIUM 25 MG PO TABS
12.5000 mg | ORAL_TABLET | Freq: Every day | ORAL | Status: DC
  Administered 2020-05-23: 12.5 mg
  Filled 2020-05-23: qty 0.5

## 2020-05-23 MED ORDER — SIMVASTATIN 20 MG PO TABS
20.0000 mg | ORAL_TABLET | Freq: Every day | ORAL | Status: DC
  Administered 2020-05-23 – 2020-06-03 (×12): 20 mg
  Filled 2020-05-23 (×11): qty 1

## 2020-05-23 MED ORDER — NIMODIPINE 6 MG/ML PO SOLN
60.0000 mg | ORAL | Status: DC
  Administered 2020-05-23 – 2020-05-24 (×6): 60 mg
  Filled 2020-05-23 (×6): qty 10

## 2020-05-23 MED ORDER — LACTATED RINGERS IV BOLUS
500.0000 mL | Freq: Once | INTRAVENOUS | Status: AC
  Administered 2020-05-23: 500 mL via INTRAVENOUS

## 2020-05-23 MED ORDER — SODIUM CHLORIDE 1 G PO TABS: 2.0000 g | ORAL_TABLET | Freq: Two times a day (BID) | ORAL | Status: DC

## 2020-05-23 MED ORDER — POTASSIUM & SODIUM PHOSPHATES 280-160-250 MG PO PACK
1.0000 | PACK | Freq: Three times a day (TID) | ORAL | Status: AC
  Administered 2020-05-23 (×3): 1
  Filled 2020-05-23 (×3): qty 1

## 2020-05-23 MED ORDER — NIMODIPINE 30 MG PO CAPS: 60.0000 mg | ORAL_CAPSULE | ORAL | Status: DC

## 2020-05-23 MED ORDER — DOCUSATE SODIUM 50 MG/5ML PO LIQD
100.0000 mg | Freq: Two times a day (BID) | ORAL | Status: DC
  Administered 2020-05-23 – 2020-05-24 (×4): 100 mg
  Filled 2020-05-23 (×5): qty 10

## 2020-05-23 MED ORDER — BETHANECHOL CHLORIDE 10 MG PO TABS
10.0000 mg | ORAL_TABLET | Freq: Three times a day (TID) | ORAL | Status: DC
  Administered 2020-05-23 – 2020-05-27 (×13): 10 mg
  Filled 2020-05-23 (×13): qty 1

## 2020-05-23 NOTE — Progress Notes (Signed)
NAME:  Alexandra Mays, MRN:  [id number], DOB:  04/14/1950, LOS: 3 ADMISSION DATE:  05/20/2020, CONSULTATION DATE: 05/21/2020 REFERRING MD: Dr. 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 AAA (abdominal aortic aneurysm) (HCC)    AAA  . COPD (chronic obstructive pulmonary disease) (HCC)   . Dyspnea    with exertion  . Pure hyperglyceridemia      Significant Hospital Events:    Consults:  Neurosurgery PCCM  Procedures:  3/10 angiogram with coiling of right P-comm aneurysm  Significant Diagnostic Tests:  3/10 CT head: Subarachnoid hemorrhage 3/10 CT angiogram of head and neck: Ruptured right P-comm aneurysm 3/11 CT head: Diffuse areas of high attenuation throughout the bilateral sulci, likely a combination of residual subarachnoid hemorrhage and postprocedural contrast staining. 2. Persistent subarachnoid hemorrhage in the suprasellar region, with intraventricular hemorrhage in the bilateral lateral ventricles and third ventricles as above. 3. Mild hydrocephalus which has developed in the interim. 4. Developing hypodensities within the right insula and frontotemporal regions, likely representing acute cortical infarct. 5. Diffuse hypodensities throughout the periventricular white matter, likely representing an element of transependymal CSF flow and chronic small vessel ischemic change  Micro Data:  3/10 MRSA PCR negative 3/10 Covid and influenza PCR negative  Antimicrobials:     Interim History / Subjective:  Overnight patient became agitated, had repeat CT head which showed persistent mild to moderate hydrocephalus with no acute change.  She was given  IV morphine Objective   Blood pressure (!) 126/49, pulse (!) 54, temperature 97.7 F (36.5 C), temperature source Axillary, resp. rate 15, height 5\' 4"  (1.626 m), weight 37.8 kg, SpO2 97 %.        Intake/Output Summary (Last 24 hours) at 05/23/2020 0830 Last data filed at 05/23/2020 0600 Gross per 24 hour  Intake 198.6 ml  Output 1400 ml  Net -1201.4 ml   Filed Weights   05/20/20 0920  Weight: 37.8 kg    Examination: General: Chronically ill-appearing elderly female, lying on the bed HEENT: [place]/AT, eyes anicteric. Dry mucous membranes, no JVD Neuro: Lethargic/sleepy, opens eyes with vocal stimuli, forgetful, antigravity in all 4 extremities.  Pupils are equal round react light Chest: Coarse breath sounds, no wheezes or rhonchi Heart: Bradycardic with regular rhythm, no murmurs or gallops Abdomen: Soft, nontender, nondistended, bowel sounds present Skin: No rash  Resolved Hospital Problem list     Assessment & Plan:  Aneurysmal subarachnoid hemorrhage H/H 2, MF 3 due to ruptured P-comm aneurysm status post coiling Continue neuro check every hour Maintain normonatremia and euvolemia Daily TCD's Neurosurgery input is appreciated Continue monitor pain Continue nimodipine Continue Keppra for seizure prophylaxis Encourage p.o. intake  Mild to moderate hydrocephalus CT head which was repeated last night showing mild to moderate hydrocephalus due to [facility] recommend not to proceed with EVD as of now as patient's exam remained stable Continue to monitor closely  Chronic systolic congestive heart failure with EF 20 to 25% Continue Coreg and losartan Monitor intake and output  Hyponatremia/hypokalemia Closely watch, repeat in the morning Electrolytes are being supplemented If sodium keep going down we will add 3% saline  COPD, not in exacerbation Continue nebs  Dementia  Continue supportive care  Best practice (evaluated daily)  Diet: Tube  feeds Pain/Anxiety/Delirium protocol (if indicated): N/A VAP protocol (if indicated): N/A DVT prophylaxis: SCDs GI prophylaxis: Famotidine Glucose control: SSI Mobility: As tolerated Disposition: Full code  Goals of Care:  Last date of multidisciplinary goals of care discussion: Per primary team Code Status: Full code  Labs   CBC: Recent Labs  Lab 05/20/20 1044 05/20/20 1055 05/23/20 0428  WBC 9.4  --  12.8*  NEUTROABS 7.4  --   --   HGB 12.0 11.9* 12.9  HCT 35.8* 35.0* 36.6  MCV 95.0  --  88.0  PLT 151  --  205    Basic Metabolic Panel: Recent Labs  Lab 05/20/20 1044 05/20/20 1055 05/23/20 0428  NA 132* 135 127*  K 3.7 3.7 2.9*  CL 102 100 96*  CO2 25  --  21*  GLUCOSE 93 85 90  BUN 17 21 14   CREATININE 0.83 0.70 0.55  CALCIUM 8.4*  --  8.5*  MG  --   --  1.8  PHOS  --   --  2.2*   GFR: Estimated Creatinine Clearance: 38.5 mL/min (by C-G formula based on SCr of 0.55 mg/dL). Recent Labs  Lab 05/20/20 1044 05/23/20 0428  WBC 9.4 12.8*    Liver Function Tests: Recent Labs  Lab 05/20/20 1044  AST 26  ALT 17  ALKPHOS 57  BILITOT 0.5  PROT 5.5*  ALBUMIN 3.0*   No results for input(s): LIPASE, AMYLASE in the last 168 hours. No results for input(s): AMMONIA in the last 168 hours.  ABG    Component Value Date/Time   PHART 7.412 05/14/2020 1347   PCO2ART 36.8 05/14/2020 1347   PO2ART 82 (L) 05/14/2020 1347   HCO3 23.7 05/14/2020 1354   TCO2 26 05/20/2020 1055   ACIDBASEDEF 1.0 05/14/2020 1354   O2SAT 67.0 05/14/2020 1354     Coagulation Profile: Recent Labs  Lab 05/20/20 1044 05/20/20 1836  INR 1.2 1.2    Cardiac Enzymes: No results for input(s): CKTOTAL, CKMB, CKMBINDEX, TROPONINI in the last 168 hours.  HbA1C: Hgb A1c MFr Bld  Date/Time Value Ref Range Status  05/14/2020 02:40 AM 5.8 (H) 4.8 - 5.6 % Final    Comment:    (NOTE) Pre diabetes:          5.7%-6.4%  Diabetes:              >6.4%  Glycemic control for    <7.0% adults with diabetes     CBG: No results for input(s): GLUCAP in the last 168 hours.   Past Medical History:  She,  has a past medical history of AAA (abdominal aortic aneurysm) (HCC), COPD (chronic obstructive pulmonary disease) (HCC), Dyspnea, and Pure hyperglyceridemia.   Surgical History:   Past Surgical History:  Procedure Laterality Date  . ABDOMINAL AORTIC ANEURYSM REPAIR N/A 07/24/2016   Procedure: ABDOMINAL AORTIC ANEURYSM  REPAIR;  Surgeon: 07/26/2016, MD;  Location: [facility] OR;  Service: Vascular;  Laterality: N/A;  . cardiac stenting  2007  . ENDARTERECTOMY Left 07/24/2016   Procedure: AORTA TO LEFT ILIAC ENDARTERECTOMY;  Surgeon: 07/26/2016, MD;  Location: [facility] OR;  Service: Vascular;  Laterality: Left;  . ENDARTERECTOMY FEMORAL Right 07/24/2016   Procedure: AORTA TO RIGHT FEMORAL ENDARTERECTOMY;  Surgeon: 07/26/2016, MD;  Location: [facility] OR;  Service: Vascular;  Laterality: Right;  . IR ANGIO INTRA EXTRACRAN SEL INTERNAL CAROTID BILAT MOD SED  05/20/2020  .  IR ANGIO VERTEBRAL SEL VERTEBRAL UNI R MOD SED  05/20/2020  . IR ANGIOGRAM FOLLOW UP STUDY  05/20/2020  . IR ANGIOGRAM FOLLOW UP STUDY  05/20/2020  . IR CT HEAD LTD  05/20/2020  . IR TRANSCATH/EMBOLIZ  05/20/2020  . RADIOLOGY WITH ANESTHESIA N/A 05/20/2020   Procedure: IR WITH ANESTHESIA - ANEURYSM;  Surgeon: Lisbeth Renshaw, MD;  Location: [facility] OR;  Service: Radiology;  Laterality: N/A;  . RIGHT/LEFT HEART CATH AND CORONARY ANGIOGRAPHY N/A 05/14/2020   Procedure: RIGHT/LEFT HEART CATH AND CORONARY ANGIOGRAPHY;  Surgeon: Tonny Bollman, MD;  Location: [facility] INVASIVE CV LAB;  Service: Cardiovascular;  Laterality: N/A;  . TOE SURGERY Left    3 rd toe Hammer toe     Social History:   reports that she has been smoking cigarettes. She has a 30.00 pack-year smoking history. She has never used smokeless tobacco. She reports that she does not drink alcohol and does not use drugs.   Family History:  Her family  history includes Stroke in her mother.   Allergies Allergies  Allergen Reactions  . No Known Allergies      Home Medications  Prior to Admission medications   Medication Sig Start Date End Date Taking? Authorizing Provider  albuterol (PROVENTIL HFA;VENTOLIN HFA) 108 (90 Base) MCG/ACT inhaler Inhale 2 puffs into the lungs every 6 (six) hours as needed for wheezing or shortness of breath.   Yes [provider]  albuterol (PROVENTIL) (2.5 MG/3ML) 0.083% nebulizer solution Take 3 mLs (2.5 mg total) by nebulization every 6 (six) hours as needed for wheezing or shortness of breath. 11/21/19  Yes Hunsucker, Lesia Sago, MD  BREO ELLIPTA 100-25 MCG/INH AEPB Inhale 1 puff into the lungs daily. 04/16/20  Yes [provider]  carvedilol (COREG) 3.125 MG tablet Take 1 tablet (3.125 mg total) by mouth 2 (two) times daily with a meal. 05/16/20  Yes Zannie Cove, MD  ibuprofen (ADVIL,MOTRIN) 200 MG tablet Take 800 mg by mouth every 6 (six) hours as needed for mild pain.   Yes [provider]  losartan (COZAAR) 25 MG tablet Take 0.5 tablets (12.5 mg total) by mouth daily. 05/16/20  Yes Zannie Cove, MD  Multiple Vitamin (MULTIVITAMIN WITH MINERALS) TABS tablet Take 1 tablet by mouth daily.   Yes [provider]  simvastatin (ZOCOR) 20 MG tablet Take 20 mg by mouth every morning.    Yes [provider]  Tiotropium Bromide Monohydrate 2.5 MCG/ACT AERS Inhale 2 puffs into the lungs daily.   Yes [provider]  polyethylene glycol (MIRALAX / GLYCOLAX) packet Take 17 g by mouth daily. Patient not taking: No sig reported 06/23/16   Forest Becker, MD     Total critical care time: 32 minutes  Performed by: Cheri Fowler   Critical care time was exclusive of separately billable procedures and treating other patients.   Critical care was necessary to treat or prevent imminent or life-threatening deterioration.   Critical care was time spent personally by me  on the following activities: development of treatment plan with patient and/or surrogate as well as nursing, discussions with consultants, evaluation of patient's response to treatment, examination of patient, obtaining history from patient or surrogate, ordering and performing treatments and interventions, ordering and review of laboratory studies, ordering and review of radiographic studies, pulse oximetry and re-evaluation of patient's condition.   Cheri Fowler MD [facility] See Amion for pager If no response to pager, please call [phone] until 7pm After 7pm, Please call  E-link [phone]

## 2020-05-23 NOTE — Progress Notes (Signed)
Subjective: Patient reports Complaining of headache unchanged  Objective: Vital signs in last 24 hours: Temp:  [97.8 F (36.6 C)-98.3 F (36.8 C)] 98.2 F (36.8 C) (03/13 0400) Pulse Rate:  [33-88] 58 (03/13 0700) Resp:  [9-31] 18 (03/13 0700) BP: (120-152)/(45-98) 123/45 (03/13 0700) SpO2:  [90 %-100 %] 98 % (03/13 0700)  Intake/Output from previous day: 03/12 0701 - 03/13 0700 In: 298.6 [I.V.:298.6] Out: 1400 [Urine:1400] Intake/Output this shift: No intake/output data recorded.  Patient is awake and alert somnolent but arousable follows commands x4 strength equal  Lab Results: Recent Labs    05/20/20 1044 05/20/20 1055 05/23/20 0428  WBC 9.4  --  12.8*  HGB 12.0 11.9* 12.9  HCT 35.8* 35.0* 36.6  PLT 151  --  205   BMET Recent Labs    05/20/20 1044 05/20/20 1055 05/23/20 0428  NA 132* 135 127*  K 3.7 3.7 2.9*  CL 102 100 96*  CO2 25  --  21*  GLUCOSE 93 85 90  BUN 17 21 14   CREATININE 0.83 0.70 0.55  CALCIUM 8.4*  --  8.5*    Studies/Results: CT HEAD WO CONTRAST  Result Date: 05/23/2020 CLINICAL DATA:  Subarachnoid hemorrhage follow EXAM: CT HEAD WITHOUT CONTRAST TECHNIQUE: Contiguous axial images were obtained from the base of the skull through the vertex without intravenous contrast. COMPARISON:  None. FINDINGS: Brain: Unchanged distribution of subarachnoid hemorrhage over both hemispheres. Blood in the posterolateral ventricles is unchanged. 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 Unchanged size and configuration of the lateral ventricles. Vascular: Atherosclerotic calcification of the internal carotid arteries at the skull base. No abnormal hyperdensity of the major intracranial arteries or dural venous sinuses. Skull: The visualized skull base, calvarium and extracranial soft tissues are normal. Sinuses/Orbits: No fluid levels or advanced mucosal thickening of the visualized paranasal sinuses. No mastoid or middle ear effusion. The orbits are normal.  IMPRESSION: Unchanged distribution of subarachnoid hemorrhage over both hemispheres with intraventricular extension. Electronically Signed   By: 05/25/2020 M.D.   On: 05/23/2020 03:40   CT HEAD WO CONTRAST  Result Date: 05/21/2020 CLINICAL DATA:  Altered level of consciousness, history of subarachnoid hemorrhage from ruptured right posterior communicating artery aneurysm, status post aneurysm embolization EXAM: CT HEAD WITHOUT CONTRAST TECHNIQUE: Contiguous axial images were obtained from the base of the skull through the vertex without intravenous contrast. COMPARISON:  05/20/2020 FINDINGS: Brain: Aneurysm coil within the region of the right posterior communicating artery, with significant streak artifact. The areas of subarachnoid hemorrhage seen in the suprasellar region on the preoperative evaluation are again noted. Intraventricular hemorrhage layering dependently within the occipital horns of the lateral ventricles is new since postprocedural CT. Decreased intraventricular hemorrhage within the third ventricle. Mild hydrocephalus, with increased prominence of the lateral and third ventricles since prior studies. Likely small developing cortical infarcts within the right insula and frontotemporal regions. Extensive hypodensities throughout the periventricular and subcortical white matter likely reflect an element of transependymal CSF flow superimposed upon chronic small vessel ischemic change. High attenuation material seen throughout the bilateral sulci may reflect a combination of subarachnoid hemorrhage and contrast staining after interventional procedure. No extra-axial fluid collection. Vascular: Post therapeutic changes from right posterior communicating artery aneurysm embolization. Mild atherosclerosis within the internal carotid arteries. Skull: Negative for fracture or focal lesion. Screws are again noted within the bilateral frontal bones. Sinuses/Orbits: Minimal mucosal thickening within the  left sphenoid sinus. Remaining sinuses are clear. Other: None. IMPRESSION: 1. Diffuse areas of high attenuation throughout  the bilateral sulci, likely a combination of residual subarachnoid hemorrhage and postprocedural contrast staining. 2. Persistent subarachnoid hemorrhage in the suprasellar region, with intraventricular hemorrhage in the bilateral lateral ventricles and third ventricles as above. 3. Mild hydrocephalus which has developed in the interim. 4. Developing hypodensities within the right insula and frontotemporal regions, likely representing acute cortical infarct. 5. Diffuse hypodensities throughout the periventricular white matter, likely representing an element of transependymal CSF flow and chronic small vessel ischemic change. Electronically Signed   By: Sharlet Salina M.D.   On: 05/21/2020 21:44   VAS [place] TRANSCRANIAL DOPPLER  Result Date: 05/22/2020  Transcranial Doppler Indications: Subarachnoid hemorrhage. Comparison Study: No prior studies. Performing Technologist: Jean Rosenthal RDMS,RVT  Examination Guidelines: A complete evaluation includes B-mode imaging, spectral Doppler, color Doppler, and power Doppler as needed of all accessible portions of each vessel. Bilateral testing is considered an integral part of a complete examination. Limited examinations for reoccurring indications may be performed as noted.  +----------+-------------+----------+-----------+-------------------+ RIGHT TCD Right VM (cm)Depth (cm)Pulsatility      Comment       +----------+-------------+----------+-----------+-------------------+ MCA                                         Unable to visualize +----------+-------------+----------+-----------+-------------------+ ACA           27.00       5.50      1.05                        +----------+-------------+----------+-----------+-------------------+ Term ICA      32.00       6.80      1.52                         +----------+-------------+----------+-----------+-------------------+ PCA           31.00       5.80      1.36                        +----------+-------------+----------+-----------+-------------------+ Opthalmic     25.00       3.60      1.65                        +----------+-------------+----------+-----------+-------------------+ ICA siphon    34.00       5.00      1.73                        +----------+-------------+----------+-----------+-------------------+ Vertebral     20.00       6.00      1.17                        +----------+-------------+----------+-----------+-------------------+  +----------+------------+----------+-----------+-------+ LEFT TCD  Left VM (cm)Depth (cm)PulsatilityComment +----------+------------+----------+-----------+-------+ MCA          50.00       4.60      1.31            +----------+------------+----------+-----------+-------+ ACA          25.00       5.10      1.24            +----------+------------+----------+-----------+-------+ Term ICA     36.00  5.40      1.56            +----------+------------+----------+-----------+-------+ PCA          26.00       5.70      1.35            +----------+------------+----------+-----------+-------+ Opthalmic    25.00       3.40      1.48            +----------+------------+----------+-----------+-------+ ICA siphon   33.00       5.60      1.31            +----------+------------+----------+-----------+-------+ Vertebral    20.00       5.70      1.21            +----------+------------+----------+-----------+-------+ Distal ICA   24.00                 1.26            +----------+------------+----------+-----------+-------+  +------------+-------+-------+             VM cm/sComment +------------+-------+-------+ Prox Basilar 23.00         +------------+-------+-------+ +---------------------+----+ Left [place]ard Ratio2.08  +---------------------+----+  Summary:  Poor right temporal window limits evaluation but otherwise normal mean flow velocities in majority of identified vessels of anterior and posterior cerebral circulation without definite vasospasm noted. Globally elevated pulsatility indices suggest diffuse increase in intracranial pressute or diffuse atherosclerosis likely. *See table(s) above for TCD measurements and observations.  Diagnosing physician: Delia Heady MD Electronically signed by Delia Heady MD on 05/22/2020 at 12:56:41 PM.    Final     Assessment/Plan: Post coil day 3 remains somnolent but intermittently awake follows commands overall neurologically stable follow-up CT scan continues to show mild to moderate hydrocephalus but patient has maintained neurologic Sam does have periods where she is more somnolent and wide-awake overall stable continue to observe continue to encourage p.o. consider feeding tube placement tomorrow sodium 127 will start salt tablets may have some difficulty getting her to take p.o. but I do not think that it is time yet to initiate hypertonic saline.  And also fluid restriction in the short-term following subarachnoid hemorrhage may not be ideal secondary to vasospasm.  LOS: 3 days     Mariam Dollar 05/23/2020, 7:59 AM

## 2020-05-24 ENCOUNTER — Inpatient Hospital Stay (HOSPITAL_COMMUNITY): Payer: Medicare Other

## 2020-05-24 DIAGNOSIS — I609 Nontraumatic subarachnoid hemorrhage, unspecified: Secondary | ICD-10-CM

## 2020-05-24 DIAGNOSIS — I608 Other nontraumatic subarachnoid hemorrhage: Secondary | ICD-10-CM | POA: Diagnosis not present

## 2020-05-24 DIAGNOSIS — I5022 Chronic systolic (congestive) heart failure: Secondary | ICD-10-CM | POA: Diagnosis not present

## 2020-05-24 LAB — MAGNESIUM
Magnesium: 2 mg/dL (ref 1.7–2.4)
Magnesium: 1.9 mg/dL (ref 1.7–2.4)

## 2020-05-24 LAB — BASIC METABOLIC PANEL
Anion gap: 6 (ref 5–15)
BUN: 19 mg/dL (ref 8–23)
CO2: 19 mmol/L — ABNORMAL LOW (ref 22–32)
Calcium: 8 mg/dL — ABNORMAL LOW (ref 8.9–10.3)
Chloride: 106 mmol/L (ref 98–111)
Creatinine, Ser: 0.63 mg/dL (ref 0.44–1.00)
GFR, Estimated: >60 mL/min (ref >60)
Glucose, Bld: 98 mg/dL (ref 70–99)
Potassium: 3.7 mmol/L (ref 3.5–5.1)
Sodium: 131 mmol/L — ABNORMAL LOW (ref 135–145)

## 2020-05-24 LAB — GLUCOSE, CAPILLARY
Glucose-Capillary: 100 mg/dL — ABNORMAL HIGH (ref 70–99)
Glucose-Capillary: 106 mg/dL — ABNORMAL HIGH (ref 70–99)
Glucose-Capillary: 113 mg/dL — ABNORMAL HIGH (ref 70–99)

## 2020-05-24 LAB — PHOSPHORUS
Phosphorus: 2.7 mg/dL (ref 2.5–4.6)
Phosphorus: 2.4 mg/dL — ABNORMAL LOW (ref 2.5–4.6)

## 2020-05-24 LAB — URIC ACID: Uric Acid, Serum: 2 mg/dL — ABNORMAL LOW (ref 2.5–7.1)

## 2020-05-24 LAB — SODIUM, URINE, RANDOM: Sodium, Ur: 50 mmol/L

## 2020-05-24 LAB — OSMOLALITY, URINE: Osmolality, Ur: 798 mOsm/kg (ref 300–900)

## 2020-05-24 IMAGING — DX DG ABDOMEN 1V
1 series · 1 of 1 positions shown · non-contrast
Comparison: [DATE]

CLINICAL DATA: OG tube placement

EXAM:
ABDOMEN - 1 VIEW

[abdomen]
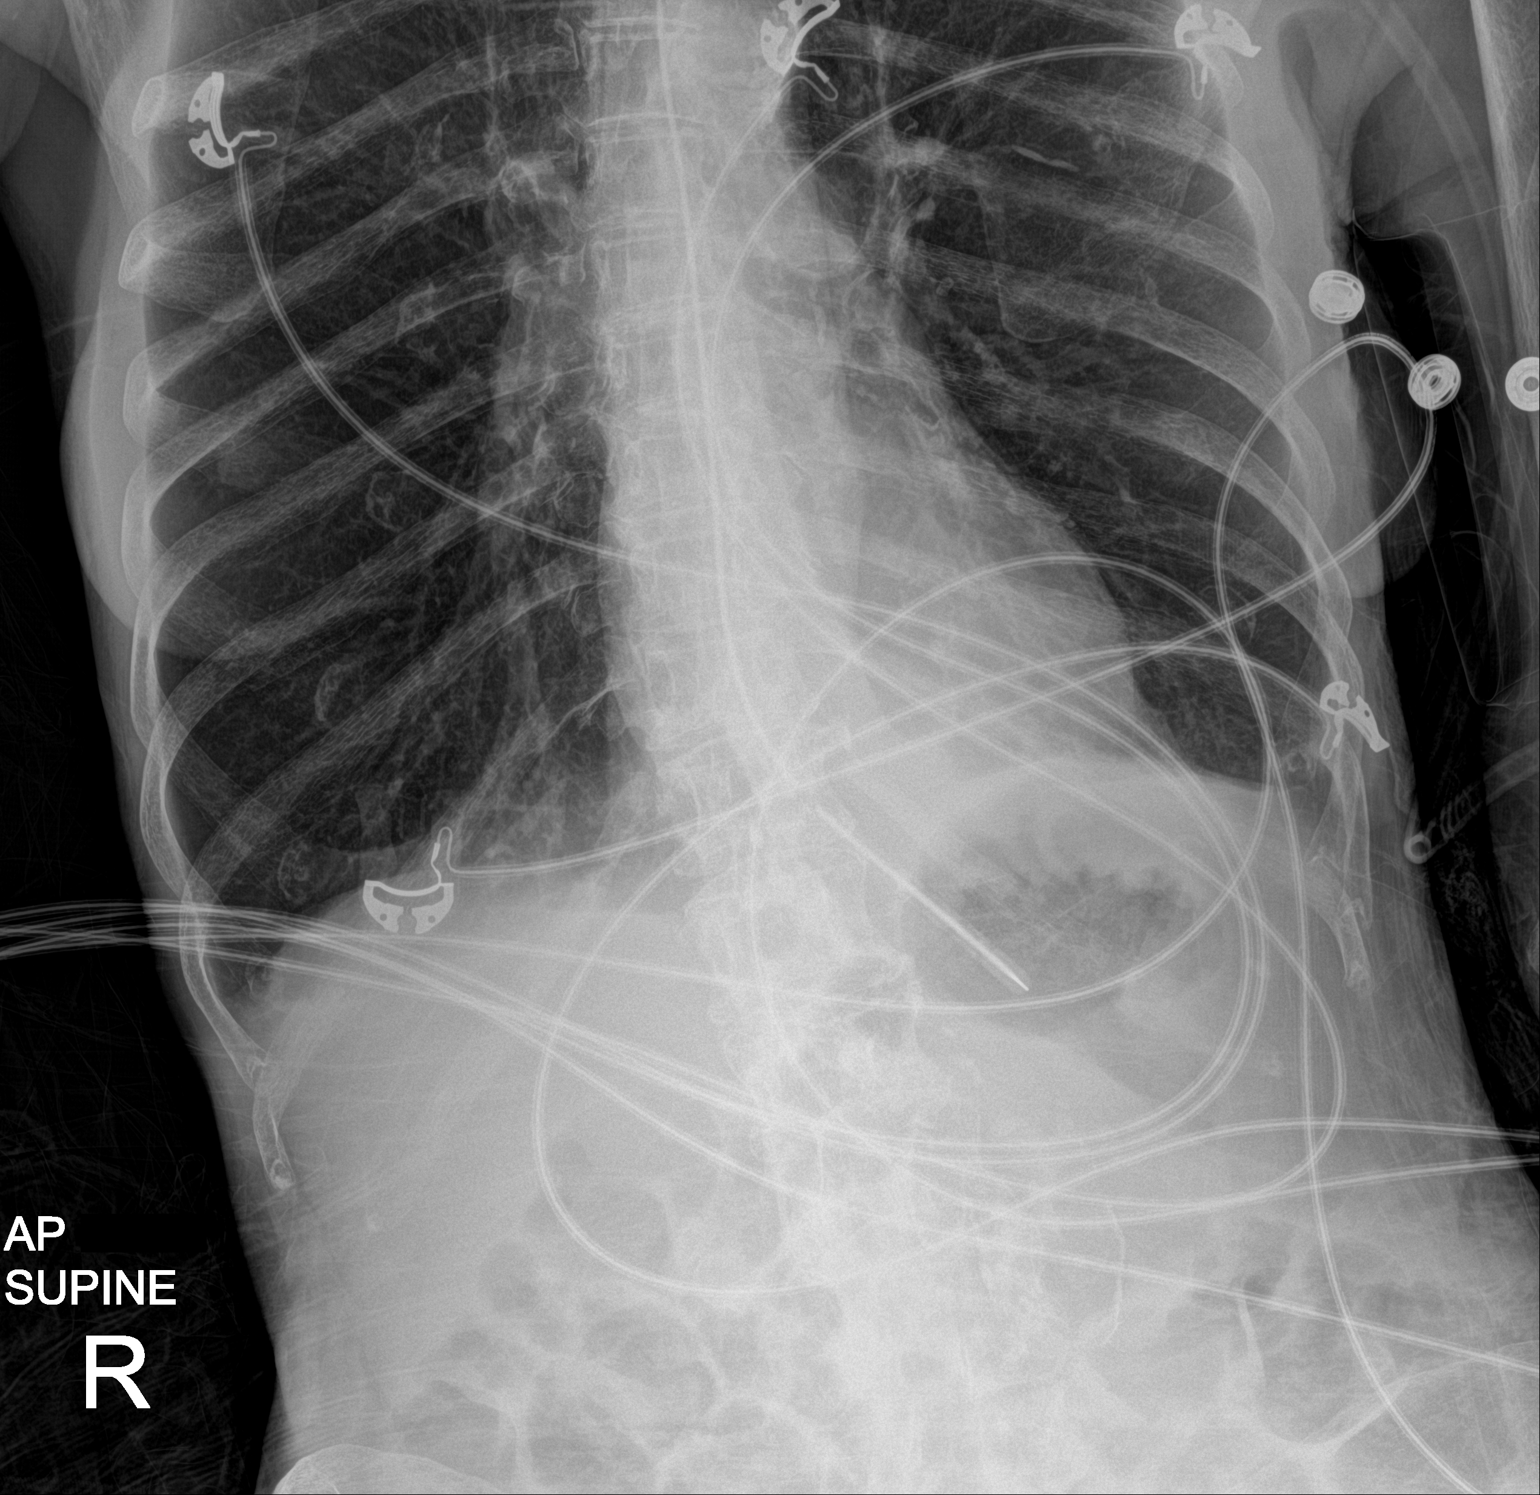

[1 of 1 positions shown; findings below may reference images not displayed]

FINDINGS: Enteric tube tip overlies proximal stomach, side-port overlies the
distal esophagus. Mild gas-filled bowel in the upper abdomen.
IMPRESSION: Enteric tube tip overlies the proximal stomach, side-port overlies
the distal esophagus, consider further advancement by 5-10 cm for
more optimal positioning

These results will be called to the ordering clinician or
representative by the Radiologist Assistant, and communication
documented in the PACS or [REDACTED].

## 2020-05-24 MED ORDER — VITAL HIGH PROTEIN PO LIQD: 1000.0000 mL | ORAL | Status: DC

## 2020-05-24 MED ORDER — OSMOLITE 1.2 CAL PO LIQD
1000.0000 mL | ORAL | Status: DC
  Administered 2020-05-24: 1000 mL
  Filled 2020-05-24: qty 1000

## 2020-05-24 MED ORDER — SODIUM CHLORIDE 0.9 % IV SOLN: INTRAVENOUS | Status: DC

## 2020-05-24 MED ORDER — NIMODIPINE 30 MG PO CAPS
30.0000 mg | ORAL_CAPSULE | ORAL | Status: DC
  Filled 2020-05-24: qty 1

## 2020-05-24 MED ORDER — OSMOLITE 1.2 CAL PO LIQD: 1000.0000 mL | ORAL | Status: DC

## 2020-05-24 MED ORDER — POTASSIUM CHLORIDE 20 MEQ PO PACK
40.0000 meq | PACK | Freq: Two times a day (BID) | ORAL | Status: AC
  Administered 2020-05-24 – 2020-05-25 (×3): 40 meq via ORAL
  Filled 2020-05-24 (×3): qty 2

## 2020-05-24 MED ORDER — PROSOURCE TF PO LIQD
45.0000 mL | Freq: Two times a day (BID) | ORAL | Status: DC
  Administered 2020-05-24 – 2020-05-25 (×3): 45 mL
  Filled 2020-05-24 (×3): qty 45

## 2020-05-24 MED ORDER — NIMODIPINE 6 MG/ML PO SOLN
30.0000 mg | ORAL | Status: DC
  Administered 2020-05-24 – 2020-05-29 (×64): 30 mg
  Filled 2020-05-24 (×43): qty 10

## 2020-05-24 MED ORDER — SODIUM CHLORIDE 0.9 % IV BOLUS
500.0000 mL | Freq: Once | INTRAVENOUS | Status: AC
  Administered 2020-05-24: 500 mL via INTRAVENOUS

## 2020-05-24 NOTE — Progress Notes (Signed)
eLink Physician-Brief Progress Note Patient Name: Alexandra Mays DOB: 1950-05-14 MRN: [id number]   Date of Service  05/24/2020  HPI/Events of Note  Patient needs a KUB to check OG tube placement.  eICU Interventions  KUB ordered.        Migdalia Dk 05/24/2020, 7:43 PM

## 2020-05-24 NOTE — Progress Notes (Signed)
Transcranial Doppler  Date POD PCO2 HCT BP  MCA ACA PCA OPHT SIPH VERT Basilar       Right  Left                                       3/14 MR     Right  Left   55  41   -52  -33   38  29   21  17   17  24    -23  -27   -24           Right  Left                                             Right  Left                                             Right  Left                                            Right  Left                                            Right  Left                                        MCA = Middle Cerebral Artery      OPHT = Opthalmic Artery     BASILAR = Basilar Artery   ACA = Anterior Cerebral Artery     SIPH = Carotid Siphon PCA = Posterior Cerebral Artery   VERT = Verterbral Artery                   Normal MCA = 62+\-12 ACA = 50+\-12 PCA = 42+\-23     05/24/2020 2:55 PM

## 2020-05-24 NOTE — Progress Notes (Signed)
Physical Therapy Treatment Patient Details Name: Alexandra Mays MRN: [id number] DOB: February 24, 1951 Today's Date: 05/24/2020    History of Present Illness 70 y.o. female who presented 05/20/20 with R neck pain and a headache. 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 Pt recently discharged from hospital 05/16/20 with NSTEMI. 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 PMH: dyspnea, COPD, AAA, pure hyperglyceridemia.    PT Comments    Pt requiring max assist for bed mobility, two person moderate assist for stand pivot transfers to and from bedside commode to recliner. Further mobility limited due to pt HA. Notified RN for pain medication. Will continue to progress as tolerated.     Follow Up Recommendations  CIR;Supervision/Assistance - 24 hour     Equipment Recommendations  3in1 (PT);Wheelchair (measurements PT);Wheelchair cushion (measurements PT)    Recommendations for Other Services       Precautions / Restrictions Precautions Precautions: Fall Restrictions Weight Bearing Restrictions: No    Mobility  Bed Mobility Overal bed mobility: Needs Assistance Bed Mobility: Supine to Sit     Supine to sit: Max assist     General bed mobility comments: Pt initiating BLE's to edge of bed, assist to execute and bring trunk to upright    Transfers Overall transfer level: Needs assistance Equipment used: 1 person hand held assist Transfers: Sit to/from Stand;Stand Pivot Transfers Sit to Stand: Mod assist;+2 physical assistance Stand pivot transfers: Mod assist;+2 physical assistance       General transfer comment: ModA + 2 to stand from edge of bed, pivot to [facility], then pivot to recliner. Pt with increased trunk flexion, utilized HHA and pt reaching for additional external support. Cues for sequencing/direction  Ambulation/Gait                  Stairs             Wheelchair Mobility    Modified Rankin (Stroke Patients Only)       Balance Overall balance assessment: Needs assistance Sitting-balance support: Feet supported Sitting balance-Leahy Scale: Fair     Standing balance support: During functional activity;Bilateral upper extremity supported Standing balance-Leahy Scale: Poor                              Cognition Arousal/Alertness: Awake/alert Behavior During Therapy: Flat affect Overall Cognitive Status: Impaired/Different from baseline Area of Impairment: Memory;Awareness;Following commands;Safety/judgement                   Current Attention Level: Sustained Memory: Decreased recall of precautions;Decreased short-term memory Following Commands: Follows one step commands inconsistently Safety/Judgement: Decreased awareness of deficits;Decreased awareness of safety Awareness: Intellectual   General Comments: History of dementia. Internally distracted by pain      Exercises      General Comments  VSS      Pertinent Vitals/Pain Pain Assessment: Faces Faces Pain Scale: Hurts whole lot Pain Location: headache Pain Descriptors / Indicators: Headache Pain Intervention(s): Limited activity within patient's tolerance;Monitored during session;Other (comment) (RN aware)    Home Living                      Prior Function            PT Goals (current goals can now be found in the care plan section) Acute Rehab PT Goals Patient Stated Goal:  none stated Potential to Achieve Goals: Good Progress towards PT goals: Progressing toward goals    Frequency    Min 4X/week      PT Plan Current plan remains appropriate    Co-evaluation              AM-PAC PT "6 Clicks" Mobility   Outcome Measure  Help needed turning from your back to your side while in a flat bed without using bedrails?: A Lot Help needed moving from lying on your back to sitting on the  side of a flat bed without using bedrails?: A Lot Help needed moving to and from a bed to a chair (including a wheelchair)?: A Lot Help needed standing up from a chair using your arms (e.g., wheelchair or bedside chair)?: A Lot Help needed to walk in hospital room?: A Lot Help needed climbing 3-5 steps with a railing? : Total 6 Click Score: 11    End of Session Equipment Utilized During Treatment: Gait belt Activity Tolerance: Treatment limited secondary to agitation Patient left: in chair;with call bell/phone within reach;with chair alarm set Nurse Communication: Mobility status PT Visit Diagnosis: Unsteadiness on feet (R26.81);Other abnormalities of gait and mobility (R26.89);Muscle weakness (generalized) (M62.81);Difficulty in walking, not elsewhere classified (R26.2);Other symptoms and signs involving the nervous system (R29.898)     Time: 3267-1245 PT Time Calculation (min) (ACUTE ONLY): 29 min  Charges:  $Therapeutic Activity: 23-37 mins    Alexandra Mays, PT, DPT Acute Rehabilitation Services Pager [phone] Office [phone]                   Alexandra Mays 05/24/2020, 2:33 PM

## 2020-05-24 NOTE — Progress Notes (Signed)
eLink Physician-Brief Progress Note Patient Name: Alexandra Mays DOB: [birth year] MRN: [id number]   Date of Service  05/24/2020  HPI/Events of Note  Patient with agitated delirium, attempting to pull out her NG tube.  eICU Interventions  Bilateral soft wrist restraints ordered.        Migdalia Dk 05/24/2020, 8:38 PM

## 2020-05-24 NOTE — PMR Pre-admission (Shared)
PMR Admission Coordinator Pre-Admission Assessment  Patient: Alexandra Mays is an 70 y.o., female MRN: [id number] DOB: Sep 22, 1950 Height: 5\' 4"  (162.6 cm) Weight: 37.8 kg              Insurance Information HMO:     PPO:      PCP:      IPA:      80/20: yes     OTHER:  PRIMARY: Medicare A & B      Policy#: [id number]      Subscriber: patient CM Name: ***      Phone#: ***     Fax#: *** Pre-Cert#: ***      Employer: *** Benefits:  Phone #: ***     Name: *** Eff. Date: ***     Deduct: ***      Out of Pocket Max: ***      Life Max: ***  CIR: ***      SNF: *** Outpatient: ***     Co-Pay: *** Home Health: ***      Co-Pay: *** DME: ***     Co-Pay: *** Providers: pt' choice SECONDARY: Generic [employer]      Policy#: [id number]      Phone#: [phone]  Financial Counselor:       Phone#:   The "Data Collection Information Summary" for patients in Inpatient Rehabilitation Facilities with attached "Privacy Act Statement-Health Care Records" was provided and verbally reviewed with: {CHL IP Patient Family [phone]  Emergency Contact Information Contact Information    Name Relation Home Work Mobile   Swan,[name] Granddaughter [phone]       Current Medical History  Patient Admitting Diagnosis: SAH d/t ruptured P-comm aneurysm, s/p coiling  History of Present Illness:  Pt is a 70 y.o. right-handed female with history of COPD/tobacco abuse, abdominal aortic aneurysm repair 07/24/2016, left carotid enterectomy 07/24/2016.  Per chart review patient reportedly independent prior to admission and still drives living with family members.  Her daughter does report some short-term memory deficits.  Presented 05/20/2020 with severe headache, neck pain with waxing and waning of mental status.  CT angiogram of head and neck showed acute subarachnoid hemorrhage predominantly on the right side.  Mild amount of blood in the ventricles without hydrocephalus.  6 x 8 mm right posterior communicating artery  aneurysm, source of subarachnoid hemorrhage.  No other aneurysm.  Patient underwent diagnostic cerebral angiogram, coil embolization of right posterior communicating artery aneurysm 05/20/2020 per Dr. 07/20/2020.  Latest follow-up cranial CT scan shows diffuse areas of high attenuation throughout the bilateral foci, likely a combination of residual subarachnoid hemorrhage and post procedural contrast staining.  Persistent subarachnoid hemorrhage in the suprasellar region, with intraventricular hemorrhage in the bilateral lateral ventricles and third ventricles and mild hydrocephalus.  Patient initially maintained on Keppra for seizure prophylaxis.  Intravenous Cardene for blood pressure control transitioned to Nimotop.  Dysphagia #1 honey thick liquid diet.  Bouts of urinary retention placed on Urecholine.  Therapy evaluations completed due to patient decreased functional mobility recommendations of physical medicine rehab consult.  Complete NIHSS TOTAL: (P) 5 Glasgow Coma Scale Score: 13  Past Medical History  Past Medical History:  Diagnosis Date  . AAA (abdominal aortic aneurysm) (HCC)    AAA  . COPD (chronic obstructive pulmonary disease) (HCC)   . Dyspnea    with exertion  . Pure hyperglyceridemia     Family History  family history includes Stroke in her mother.  Prior Rehab/Hospitalizations:  Has the patient had prior rehab  or hospitalizations prior to admission? Yes  Has the patient had major surgery during 100 days prior to admission? Yes  Current Medications   Current Facility-Administered Medications:  .  0.9 %  sodium chloride infusion, , Intravenous, Continuous, Agarwala, Ravi, MD, Last Rate: 75 mL/hr at 05/24/20 1000, New Bag at 05/24/20 1000 .  acetaminophen (TYLENOL) tablet 650 mg, 650 mg, Oral, Q4H PRN **OR** acetaminophen (TYLENOL) 160 MG/5ML solution 650 mg, 650 mg, Per Tube, Q4H PRN, 650 mg at 05/24/20 1209 **OR** acetaminophen (TYLENOL) suppository 650 mg, 650 mg,  Rectal, Q4H PRN, Coletta Memos, MD .  albuterol (PROVENTIL) (2.5 MG/3ML) 0.083% nebulizer solution 2.5 mg, 2.5 mg, Nebulization, Q6H PRN, Coletta Memos, MD .  bethanechol (URECHOLINE) tablet 10 mg, 10 mg, Per Tube, TID, Cheri Fowler, MD, 10 mg at 05/24/20 1100 .  Chlorhexidine Gluconate Cloth 2 % PADS 6 each, 6 each, Topical, Daily, Lisbeth Renshaw, MD, 6 each at 05/23/20 1109 .  docusate (COLACE) 50 MG/5ML liquid 100 mg, 100 mg, Per Tube, BID, Lisbeth Renshaw, MD, 100 mg at 05/24/20 1100 .  feeding supplement (PROSource TF) liquid 45 mL, 45 mL, Per Tube, BID, Agarwala, Ravi, MD .  feeding supplement (VITAL HIGH PROTEIN) liquid 1,000 mL, 1,000 mL, Per Tube, Q24H, Agarwala, Ravi, MD .  fluticasone furoate-vilanterol (BREO ELLIPTA) 100-25 MCG/INH 1 puff, 1 puff, Inhalation, Daily, Coletta Memos, MD, 1 puff at 05/24/20 0843 .  levETIRAcetam (KEPPRA) 100 MG/ML solution 500 mg, 500 mg, Per Tube, BID, Lisbeth Renshaw, MD, 500 mg at 05/24/20 1100 .  melatonin tablet 3 mg, 3 mg, Per Tube, QHS, Lisbeth Renshaw, MD, 3 mg at 05/23/20 2234 .  niMODipine (NIMOTOP) capsule 30 mg, 30 mg, Oral, Q2H **OR** niMODipine (NYMALIZE) 6 MG/ML oral solution 30 mg, 30 mg, Per Tube, Q2H, Agarwala, Ravi, MD, 30 mg at 05/24/20 1209 .  ondansetron (ZOFRAN-ODT) disintegrating tablet 4 mg, 4 mg, Oral, Q6H PRN **OR** ondansetron (ZOFRAN) injection 4 mg, 4 mg, Intravenous, Q6H PRN, Coletta Memos, MD, 4 mg at 05/21/20 1038 .  pantoprazole sodium (PROTONIX) 40 mg/20 mL oral suspension 40 mg, 40 mg, Per Tube, Daily, Lisbeth Renshaw, MD, 40 mg at 05/24/20 1100 .  polyethylene glycol (MIRALAX / GLYCOLAX) packet 17 g, 17 g, Oral, Daily PRN, Coletta Memos, MD .  potassium chloride (KLOR-CON) packet 40 mEq, 40 mEq, Oral, BID, Agarwala, Ravi, MD, 40 mEq at 05/24/20 1100 .  simvastatin (ZOCOR) tablet 20 mg, 20 mg, Per Tube, Daily, Lisbeth Renshaw, MD, 20 mg at 05/24/20 1100 .  sodium chloride tablet 2 g, 2 g, Per Tube, BID  WC, Lisbeth Renshaw, MD, 2 g at 05/24/20 0851 .  umeclidinium bromide (INCRUSE ELLIPTA) 62.5 MCG/INH 2 puff, 2 puff, Inhalation, Daily, Coletta Memos, MD, 2 puff at 05/23/20 1121  Patients Current Diet:  Diet Order            DIET - DYS 1 Room service appropriate? Yes with Assist; Fluid consistency: Honey Thick  Diet effective now                 Precautions / Restrictions Precautions Precautions: Fall Restrictions Weight Bearing Restrictions: No   Has the patient had 2 or more falls or a fall with injury in the past year?No  Prior Activity Level Community (5-7x/wk): drives, picks grandchildren up from school  Prior Functional Level Prior Function Level of Independence: Independent Comments: Pt's daughter reports pt has some STM deficits, confusing dates and times at baseline; drives herself and handles her own finances;  needs assistance with keeping appointments and with medications; independent with all functional mobility without AD/AE, gets SOB with stairs though  Self Care: Did the patient need help bathing, dressing, using the toilet or eating?  Independent  Indoor Mobility: Did the patient need assistance with walking from room to room (with or without device)? Independent  Stairs: Did the patient need assistance with internal or external stairs (with or without device)? Independent  Functional Cognition: Did the patient need help planning regular tasks such as shopping or remembering to take medications? Needed some help  Home Assistive Devices / Equipment Home Assistive Devices/Equipment: Eyeglasses,Dentures (specify type) Home Equipment: Grab bars - tub/shower  Prior Device Use: Indicate devices/aids used by the patient prior to current illness, exacerbation or injury? None of the above  Current Functional Level Cognition  Arousal/Alertness: Lethargic Overall Cognitive Status: Impaired/Different from baseline Current Attention Level: Focused Orientation  Level: (P) Oriented to person,Disoriented to situation,Disoriented to time,Disoriented to place Following Commands: Follows one step commands inconsistently,Follows one step commands with increased time Safety/Judgement: Decreased awareness of safety,Decreased awareness of deficits General Comments: pt reports its 1900 something. pt does acknowledge she goes by "Dennie Bible" Attention: Focused Focused Attention: Impaired Focused Attention Impairment: Verbal basic Memory: Impaired Memory Impairment: Storage deficit Awareness: Impaired Awareness Impairment: Intellectual impairment    Extremity Assessment (includes Sensation/Coordination)  Upper Extremity Assessment: Generalized weakness,Difficult to assess due to impaired cognition  Lower Extremity Assessment: Defer to PT evaluation RLE Deficits / Details: Generalized weakness with observing mobility as pt unable to follow commands to formally assess; decreased coordination, especially on the R as she needed physical assistance to weight shift and advance the R leg RLE Coordination: decreased fine motor,decreased gross motor LLE Deficits / Details: Generalized weakness with observing mobility as pt unable to follow commands to formally assess; decreased coordination LLE Coordination: decreased gross motor,decreased fine motor    ADLs  Overall ADL's : Needs assistance/impaired Eating/Feeding Details (indicate cue type and reason): declines food present in room Toilet Transfer: Maximal assistance,BSC,Stand-pivot Toilet Transfer Details (indicate cue type and reason): pt with posterior lean and full body extension. pt attempting to exit the bed but unable to stand need to void or agree that voiding is required. pt when sat on potty automatic in voiding Toileting- Clothing Manipulation and Hygiene: Total assistance General ADL Comments: pt attempting to exit the bed with posey on arrival with no verbalization to needs. pt toileted / placed in side lying  with hOB elevated and pillow between needs posey don and comfortable. pt now resting and all restless behavior has resolved    Mobility  Overal bed mobility: Needs Assistance Bed Mobility: Rolling,Supine to Sit,Sit to Supine Rolling: Total assist Sidelying to sit: Total assist Supine to sit: Total assist Sit to supine: Total assist General bed mobility comments: requires total (A) with all aspects. pt observed pulling herself into long sitting but does not initiate on command    Transfers  Overall transfer level: Needs assistance Equipment used: 1 person hand held assist Transfers: Sit to/from Stand Sit to Stand: Max assist General transfer comment: dependent on OT for elevation and pivot ot chair. pt does not initiate task. pt in full extension with with transfer.    Ambulation / Gait / Stairs / Wheelchair Mobility  Ambulation/Gait Ambulation/Gait assistance: Mod assist Gait Distance (Feet): 10 Feet Assistive device: 1 person hand held assist Gait Pattern/deviations: Step-through pattern,Decreased step length - right,Decreased step length - left,Decreased stride length,Decreased weight shift to right,Decreased weight shift to  left,Shuffle,Narrow base of support General Gait Details: Pt with eyes closed despite cues. Requiring 1-2 UE support on PT anterior to pt and physical assist for steadying and to weight shift bil and advance R leg with all gait. Pt tends to try to avoid leaning over to R leg despite cues. ModA for safety and for cuing. Poor bil feet clearance. Gait velocity: reduced Gait velocity interpretation: <1.31 ft/sec, indicative of household ambulator    Posture / Balance Dynamic Sitting Balance Sitting balance - Comments: UE support sitting EOB with min guard-A Balance Overall balance assessment: Needs assistance Sitting-balance support: Bilateral upper extremity supported,Feet supported Sitting balance-Leahy Scale: Zero Sitting balance - Comments: UE support sitting  EOB with min guard-A Standing balance support: During functional activity Standing balance-Leahy Scale: Zero Standing balance comment: 1-2 UE support and external assist    Special needs/care consideration Continuous Drip IV  ***, Skin Ecchymosis: Arm, hand/bilateral, Cortrak, Urethral catheter, Bladder incontinence and Designated visitor [name], granddaughter     Previous Surveyor, mineralsHome Environment (from acute therapy documentation) Living Arrangements: Other relatives  Lives With: Family Available Help at Discharge: Family,Available 24 hours/day Type of Home: House Home Layout: Two level,Able to live on main level with bedroom/bathroom Home Access: Stairs to enter Entrance Stairs-Rails:  (rail in the middle) Secretary/administratorntrance Stairs-Number of Steps: 3 Bathroom Shower/Tub: Engineer, manufacturing systemsTub/shower unit Bathroom Toilet: Standard Bathroom Accessibility: Yes How Accessible: Accessible via walker Home Care Services: No Additional Comments: information obtained from PT evaluation as no family present during OT eval and poor historian from patient  Discharge Living Setting Plans for Discharge Living Setting: House Type of Home at Discharge: House Discharge Home Layout: Two level,Able to live on main level with bedroom/bathroom Discharge Home Access: Stairs to enter Entrance Stairs-Rails:  (rail in the middle) Secretary/administratorntrance Stairs-Number of Steps: 3 Discharge Bathroom Shower/Tub: Tub/shower unit Discharge Bathroom Toilet: Standard Discharge Bathroom Accessibility: Yes How Accessible: Accessible via walker Does the patient have any problems obtaining your medications?: No  Social/Family/Support Systems Patient Roles:  (grandmother) Anticipated Caregiver: [name], daughter Anticipated Caregiver's Contact Information: [phone] Caregiver Availability: 24/7 (currently works but can make adjustments to work schedule if necessary) Discharge Plan Discussed with Primary Caregiver: Yes Is Caregiver In Agreement with  Plan?: Yes Does Caregiver/Family have Issues with Lodging/Transportation while Pt is in Rehab?: No   Goals Patient/Family Goal for Rehab: Min A: PT/OT/ST Expected length of stay: 23-27 days Pt/Family Agrees to Admission and willing to participate: Yes Program Orientation Provided & Reviewed with Pt/Caregiver Including Roles  & Responsibilities: Yes   Decrease burden of Care through IP rehab admission: NA   Possible need for SNF placement upon discharge: Not anticipated   Patient Condition: {PATIENT'S CONDITION:22832}  Preadmission Screen Completed By:  Domingo PulseLauren P Graves Madden, CCC-SLP, 05/24/2020 2:16 PM ______________________________________________________________________   Discussed status with Dr. Marland Kitchen***on***at *** and received approval for admission today.  Admission Coordinator:  Domingo PulseLauren P Graves Madden, time***/Date***

## 2020-05-24 NOTE — Procedures (Signed)
Cortrak  Person Inserting Tube:  Kirsty Monjaraz, RD Tube Type:  Cortrak - 43 inches Tube Location:  Right nare Initial Placement:  Stomach Secured by: Bridle Technique Used to Measure Tube Placement:  Documented cm marking at nare/ corner of mouth Cortrak Secured At:  65 cm    Cortrak Tube Team Note:  Consult received to place a Cortrak feeding tube.   No x-ray is required. RN may begin using tube.   If the tube becomes dislodged please keep the tube and contact the Cortrak team at [email or web address] (password [email or web address]) for replacement.  If after hours and replacement cannot be delayed, place a NG tube and confirm placement with an abdominal x-ray.    Jahfari Ambers MS, RDN, LDN, CNSC Registered Dietitian III Clinical Nutrition RD Pager and On-Call Pager Number Located in Amion     

## 2020-05-24 NOTE — Progress Notes (Signed)
Inpatient Rehab Admissions Coordinator:  Pt not medically ready for potential CIR admission. Will continue to follow.   Chalet Kerwin Graves Madden, MS, CCC-SLP Admissions Coordinator [phone]  

## 2020-05-24 NOTE — Progress Notes (Signed)
NAME:  Alexandra Mays, MRN:  [id number], DOB:  11/20/1950, LOS: 4 ADMISSION DATE:  05/20/2020, CONSULTATION DATE: 05/21/2020 REFERRING MD: Dr. 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 AAA (abdominal aortic aneurysm) (HCC)    AAA  . COPD (chronic obstructive pulmonary disease) (HCC)   . Dyspnea    with exertion  . Pure hyperglyceridemia      Significant Hospital Events:    Consults:  Neurosurgery PCCM  Procedures:  3/10 angiogram with coiling of right P-comm aneurysm  Significant Diagnostic Tests:  3/10 CT head: Subarachnoid hemorrhage 3/10 CT angiogram of head and neck: Ruptured right P-comm aneurysm 3/11 CT head: Diffuse areas of high attenuation throughout the bilateral sulci, likely a combination of residual subarachnoid hemorrhage and postprocedural contrast staining. 2. Persistent subarachnoid hemorrhage in the suprasellar region, with intraventricular hemorrhage in the bilateral lateral ventricles and third ventricles as above. 3. Mild hydrocephalus which has developed in the interim. 4. Developing hypodensities within the right insula and frontotemporal regions, likely representing acute cortical infarct. 5. Diffuse hypodensities throughout the periventricular white matter, likely representing an element of transependymal CSF flow and chronic small vessel ischemic change  Micro Data:  3/10 MRSA PCR negative 3/10 Covid and influenza PCR negative  Antimicrobials:     Interim History / Subjective:   Bradycardic in 40-50s with soft BPs in 100-120s systolic. Pt is comfortable and awakens to verbal stimulation. States that she has continued  pain in her right posterior neck that has persisted since her admission date. Has little appetite and states that she feels full quickly with abdominal pain after eating. No bowel movement since 3/10.   Objective   Blood pressure (!) 106/50, pulse (!) 52, temperature 97.9 F (36.6 C), temperature source Axillary, resp. rate 14, height 5\' 4"  (1.626 m), weight 37.8 kg, SpO2 95 %.        Intake/Output Summary (Last 24 hours) at 05/24/2020 1026 Last data filed at 05/24/2020 0600 Gross per 24 hour  Intake 629.88 ml  Output 650 ml  Net -20.12 ml   Filed Weights   05/20/20 0920  Weight: 37.8 kg    Examination: General: Cachectic, sleepy, chronically ill appearing elderly female lying comfortably in bed, NAD HEENT: Dry mucous membranes, no JVD, gastric tube in place Neuro: Oriented to person and situation; sleepy but easily arousable to voice, able to lift arms and legs on command, responds appropriately to questions, PERRL Chest: CTAB, normal respiratory effort, no accessory muscle use Heart: Bradycardic, regular rhythm, normal S1S2, no MRG Extremities: No peripheral edema, 2+ bilateral radial pulses Abdomen: Soft, nontender, nondistended, no bowel sounds present Skin: No rashes  Resolved Hospital Problem list     Assessment & Plan:  Aneurysmal subarachnoid hemorrhage H/H 2, MF 3 due to ruptured P-comm aneurysm status post coiling -holding Coreg and Losartan in context of somnolence with bradycardia and low BPs -correcting hyponatremia -cont. Nimodipine q4h -cont. Keppra for sz ppx -maintain sys BP 120-160 -elevate HOB to 30 degrees -q4h neuro checks -f/u TCD today  Mild to moderate hydrocephalus -if no improvement in mental status with improved BP and HR consider repeat CT to evaluate for worsening hydrocephalus  Chronic systolic  congestive heart failure with EF 20 to 25% -hold Coreg and losartan in context of AMS with bradycardia and soft BPs -careful with volume  administration -cont simvastatin -strict I&O  Hyponatremia/hypokalemia -Na 127 3/13 -500 mL NS bolus -K repleted this AM -f/u BMP  COPD, not in exacerbation -cont. Breo Ellipta, Incruse Ellipta QD -cont albuterol PRN  Malnutrition -pt is cachectic on exam, clear muscle wasting with prominence of the clavicles and atrophic quadriceps -poor nutritional status at baseline -nocturnal tube feeds -scheduled meals -nutrition to see patient -f/u outpatient for early satiety and postprandial abdominal pain -cont. famotidine  Constipation -No bowel movement since 3/10 but poor PO intake -Tube feeds, encourage TID meals -Colace BID  Dementia -not on medication at home -cont. supportive care - frequent orientation, daytime meals, pain mangement, melatonin QHS  Best practice (evaluated daily)  Diet: Tube feeds Pain/Anxiety/Delirium protocol (if indicated): Tylenol PRN VAP protocol (if indicated): N/A DVT prophylaxis: SCDs GI prophylaxis: Famotidine Glucose control: SSI Mobility: As tolerated Disposition: Full code  Goals of Care:  Last date of multidisciplinary goals of care discussion: Per primary team Code Status: Full code  Labs   CBC: Recent Labs  Lab 05/20/20 1044 05/20/20 1055 05/23/20 0428  WBC 9.4  --  12.8*  NEUTROABS 7.4  --   --   HGB 12.0 11.9* 12.9  HCT 35.8* 35.0* 36.6  MCV 95.0  --  88.0  PLT 151  --  205    Basic Metabolic Panel: Recent Labs  Lab 05/20/20 1044 05/20/20 1055 05/23/20 0428  NA 132* 135 127*  K 3.7 3.7 2.9*  CL 102 100 96*  CO2 25  --  21*  GLUCOSE 93 85 90  BUN 17 21 14   CREATININE 0.83 0.70 0.55  CALCIUM 8.4*  --  8.5*  MG  --   --  1.8  PHOS  --   --  2.2*   GFR: Estimated Creatinine Clearance: 38.5 mL/min (by C-G formula based on SCr of 0.55 mg/dL). Recent Labs  Lab 05/20/20 1044 05/23/20 0428  WBC 9.4 12.8*    Liver Function Tests: Recent Labs  Lab 05/20/20 1044  AST 26  ALT 17  ALKPHOS 57  BILITOT 0.5   PROT 5.5*  ALBUMIN 3.0*   No results for input(s): LIPASE, AMYLASE in the last 168 hours. No results for input(s): AMMONIA in the last 168 hours.  ABG    Component Value Date/Time   PHART 7.412 05/14/2020 1347   PCO2ART 36.8 05/14/2020 1347   PO2ART 82 (L) 05/14/2020 1347   HCO3 23.7 05/14/2020 1354   TCO2 26 05/20/2020 1055   ACIDBASEDEF 1.0 05/14/2020 1354   O2SAT 67.0 05/14/2020 1354     Coagulation Profile: Recent Labs  Lab 05/20/20 1044 05/20/20 1836  INR 1.2 1.2    Cardiac Enzymes: No results for input(s): CKTOTAL, CKMB, CKMBINDEX, TROPONINI in the last 168 hours.  HbA1C: Hgb A1c MFr Bld  Date/Time Value Ref Range Status  05/14/2020 02:40 AM 5.8 (H) 4.8 - 5.6 % Final    Comment:    (NOTE) Pre diabetes:          5.7%-6.4%  Diabetes:              >6.4%  Glycemic control for   <7.0% adults with diabetes     CBG: No results for input(s): GLUCAP in the last 168 hours.   Past Medical History:  She,  has a past medical history of AAA (abdominal aortic aneurysm) (HCC),  COPD (chronic obstructive pulmonary disease) (HCC), Dyspnea, and Pure hyperglyceridemia.   Surgical History:   Past Surgical History:  Procedure Laterality Date  . ABDOMINAL AORTIC ANEURYSM REPAIR N/A 07/24/2016   Procedure: ABDOMINAL AORTIC ANEURYSM  REPAIR;  Surgeon: Sherren Kerns, MD;  Location: [facility] OR;  Service: Vascular;  Laterality: N/A;  . cardiac stenting  2007  . ENDARTERECTOMY Left 07/24/2016   Procedure: AORTA TO LEFT ILIAC ENDARTERECTOMY;  Surgeon: Sherren Kerns, MD;  Location: [facility] OR;  Service: Vascular;  Laterality: Left;  . ENDARTERECTOMY FEMORAL Right 07/24/2016   Procedure: AORTA TO RIGHT FEMORAL ENDARTERECTOMY;  Surgeon: Sherren Kerns, MD;  Location: [facility] OR;  Service: Vascular;  Laterality: Right;  . IR ANGIO INTRA EXTRACRAN SEL INTERNAL CAROTID BILAT MOD SED  05/20/2020  . IR ANGIO VERTEBRAL SEL VERTEBRAL UNI R MOD SED  05/20/2020  . IR ANGIOGRAM FOLLOW UP STUDY   05/20/2020  . IR ANGIOGRAM FOLLOW UP STUDY  05/20/2020  . IR CT HEAD LTD  05/20/2020  . IR TRANSCATH/EMBOLIZ  05/20/2020  . RADIOLOGY WITH ANESTHESIA N/A 05/20/2020   Procedure: IR WITH ANESTHESIA - ANEURYSM;  Surgeon: Lisbeth Renshaw, MD;  Location: [facility] OR;  Service: Radiology;  Laterality: N/A;  . RIGHT/LEFT HEART CATH AND CORONARY ANGIOGRAPHY N/A 05/14/2020   Procedure: RIGHT/LEFT HEART CATH AND CORONARY ANGIOGRAPHY;  Surgeon: Tonny Bollman, MD;  Location: [facility] [place] INVASIVE CV LAB;  Service: Cardiovascular;  Laterality: N/A;  . TOE SURGERY Left    3 rd toe Hammer toe     Social History:   reports that she has been smoking cigarettes. She has a 30.00 pack-year smoking history. She has never used smokeless tobacco. She reports that she does not drink alcohol and does not use drugs.   Family History:  Her family history includes Stroke in her mother.   Allergies Allergies  Allergen Reactions  . No Known Allergies      Home Medications  Prior to Admission medications   Medication Sig Start Date End Date Taking? Authorizing Provider  albuterol (PROVENTIL HFA;VENTOLIN HFA) 108 (90 Base) MCG/ACT inhaler Inhale 2 puffs into the lungs every 6 (six) hours as needed for wheezing or shortness of breath.   Yes [provider]  albuterol (PROVENTIL) (2.5 MG/3ML) 0.083% nebulizer solution Take 3 mLs (2.5 mg total) by nebulization every 6 (six) hours as needed for wheezing or shortness of breath. 11/21/19  Yes Hunsucker, Lesia Sago, MD  BREO ELLIPTA 100-25 MCG/INH AEPB Inhale 1 puff into the lungs daily. 04/16/20  Yes [provider]  carvedilol (COREG) 3.125 MG tablet Take 1 tablet (3.125 mg total) by mouth 2 (two) times daily with a meal. 05/16/20  Yes Zannie Cove, MD  ibuprofen (ADVIL,MOTRIN) 200 MG tablet Take 800 mg by mouth every 6 (six) hours as needed for mild pain.   Yes [provider]  losartan (COZAAR) 25 MG tablet Take 0.5 tablets (12.5 mg total) by mouth daily. 05/16/20   Yes Zannie Cove, MD  Multiple Vitamin (MULTIVITAMIN WITH MINERALS) TABS tablet Take 1 tablet by mouth daily.   Yes [provider]  simvastatin (ZOCOR) 20 MG tablet Take 20 mg by mouth every morning.    Yes [provider]  Tiotropium Bromide Monohydrate 2.5 MCG/ACT AERS Inhale 2 puffs into the lungs daily.   Yes [provider]  polyethylene glycol (MIRALAX / GLYCOLAX) packet Take 17 g by mouth daily. Patient not taking: No sig reported 06/23/16   Forest Becker, MD    Janyth Pupa  Daila Elbert, Medical Student 05/24/2020 , 11:00 AM

## 2020-05-24 NOTE — Progress Notes (Signed)
  NEUROSURGERY PROGRESS NOTE   No issues overnight.  No concerns this am "I feel grumpy"  EXAM:  BP 122/60   Pulse (!) 51   Temp 97.7 F (36.5 C) (Axillary)   Resp 14   Ht 5\' 4"  (1.626 m)   Wt 37.8 kg   SpO2 94%   BMI 14.30 kg/m   Awake, alert, oriented to self but not year. Does know she is at a hospital but not sure which one. Speech slow, confused at times CN grossly intact  MAEW, seemingly nonfocal  IMPRESSION/PLAN  70 y.o. female SAHd#5 s/p RPcom aneurysm coiling. Neurologically stable without clinical signs of hydrocephalus. - continue nimotop, keppra, supportive care - report any change in mental status - TCDs today - Appreciate CCM assistance with patient

## 2020-05-25 DIAGNOSIS — E43 Unspecified severe protein-calorie malnutrition: Secondary | ICD-10-CM

## 2020-05-25 DIAGNOSIS — I255 Ischemic cardiomyopathy: Secondary | ICD-10-CM

## 2020-05-25 HISTORY — DX: Unspecified severe protein-calorie malnutrition: E43

## 2020-05-25 LAB — MAGNESIUM
Magnesium: 2 mg/dL (ref 1.7–2.4)
Magnesium: 1.8 mg/dL (ref 1.7–2.4)

## 2020-05-25 LAB — GLUCOSE, CAPILLARY
Glucose-Capillary: 123 mg/dL — ABNORMAL HIGH (ref 70–99)
Glucose-Capillary: 122 mg/dL — ABNORMAL HIGH (ref 70–99)
Glucose-Capillary: 117 mg/dL — ABNORMAL HIGH (ref 70–99)
Glucose-Capillary: 154 mg/dL — ABNORMAL HIGH (ref 70–99)
Glucose-Capillary: 133 mg/dL — ABNORMAL HIGH (ref 70–99)
Glucose-Capillary: 147 mg/dL — ABNORMAL HIGH (ref 70–99)

## 2020-05-25 LAB — PHOSPHORUS
Phosphorus: 2.1 mg/dL — ABNORMAL LOW (ref 2.5–4.6)
Phosphorus: 2.9 mg/dL (ref 2.5–4.6)

## 2020-05-25 MED ORDER — PROSOURCE TF PO LIQD
45.0000 mL | Freq: Every day | ORAL | Status: DC
  Administered 2020-05-26 – 2020-06-04 (×10): 45 mL
  Filled 2020-05-25 (×9): qty 45

## 2020-05-25 MED ORDER — HEPARIN SODIUM (PORCINE) 5000 UNIT/ML IJ SOLN
5000.0000 [IU] | Freq: Three times a day (TID) | INTRAMUSCULAR | Status: DC
  Administered 2020-05-25 – 2020-06-04 (×30): 5000 [IU] via SUBCUTANEOUS
  Filled 2020-05-25 (×31): qty 1

## 2020-05-25 MED ORDER — POTASSIUM & SODIUM PHOSPHATES 280-160-250 MG PO PACK
2.0000 | PACK | ORAL | Status: AC
Start: 2020-05-25 — End: 2020-05-25
  Administered 2020-05-25 (×2): 2
  Filled 2020-05-25 (×2): qty 2

## 2020-05-25 MED ORDER — OSMOLITE 1.2 CAL PO LIQD
1000.0000 mL | ORAL | Status: DC
  Administered 2020-05-26 – 2020-06-04 (×6): 1000 mL
  Filled 2020-05-25 (×8): qty 1000

## 2020-05-25 MED ORDER — ADULT MULTIVITAMIN W/MINERALS CH
1.0000 | ORAL_TABLET | Freq: Every day | ORAL | Status: DC
  Administered 2020-05-25 – 2020-06-03 (×10): 1
  Filled 2020-05-25 (×9): qty 1

## 2020-05-25 NOTE — Progress Notes (Signed)
Initial Nutrition Assessment  DOCUMENTATION CODES:  Severe malnutrition in context of chronic illness  INTERVENTION:  Continue tube feeding via NG/OG: Osmolite 1.2 at 30 ml/h (720 ml per day) and increase rate by 10 ml every 8 hours until goal of 55 ml/hr is reached Prosource TF 45 ml - 1 packet daily  Provides 1624 kcal, 77 gm protein, 984 ml free water daily.  Monitor magnesium, potassium, and phosphorus daily for at least 3 days, MD to replete as needed, as pt is at risk for refeeding syndrome given poor PO intake PTA.  Add MVI with minerals daily.  NUTRITION DIAGNOSIS:  Severe Malnutrition related to chronic illness (chronic abdominal pain) as evidenced by severe muscle depletion,severe fat depletion,per patient/family report,energy intake < or equal to 50% for > or equal to 1 month.  GOAL:  Patient will meet greater than or equal to 90% of their needs  MONITOR:  TF tolerance,Labs  REASON FOR ASSESSMENT:  Consult,New TF Enteral/tube feeding initiation and management  ASSESSMENT:  70 yo female with a PMH of COPD and recent NSTEMI who presents with acute aneurysmal subarachnoid hemorrhage s/p coiling. 3/10 - CT head: Subarachnoid hemorrhage; CT angiogram of head and neck: Ruptured right P-comm aneurysm 3/11 - CT head: Diffuse areas of high attenuation throughout the bilateral sulci, likely a combination of residual subarachnoid hemorrhage and postprocedural contrast staining; SLP rec'd Dys 1 diet with thins 3/14 - Cortrak placed - TF initiated; pt later pulled cortrak and NG/OG was replaced by nurse - TF restarted  Current TF order: Osmolite 1.2 @ 20 ml/hr continuous with 45 ml ProSource TF BID - provides 656 calories, 48 grams of protein, 393 ml of free water  Spoke with granddaughter at bedside as pt was asleep. Granddaughter reports that pt has always been a "picky eater," in that she does not like a lot of meats, but does eat fruits, vegetables, and grains. Breakfast: Toast  (cinnamon/sugar, cinnamon/raisin, PB, or just butter) and juice. Lunch: granddaughter works during the day, so pt is on her own. Will eat a hotdog with onions or sometimes nothing at all. Dinner: vegetables (corn, green beans), starchy side (potatoes, sweet potatoes), soup (potato, tomato), dinner rolls, grilled cheese. Family usually has a meat, but she does not eat that.   For the past 4 years, pt has complained that she gets a pain in stomach after eating. For the past year, she cannot eat more than a "child-sized" portion of food without getting this pain. Granddaughter noticed this change and said nothing has been done to address it at previous PCP visits.  Granddaughter has endorsed weight loss, more in the past year, but a steady decline starting from when the pain first began.  Per granddaughter, pt is almost completely independent. She goes to the store, cooks her own meals, drops off/picks up great grandkids. She lives with granddaughter so granddaughter can help with finances, orient to time sometimes, and so on.  Continuing to increase TF rate to goal while monitoring refeeding labs.  Spoke with MD about need for EGD for cause of abdominal pain prior to discharge. Also spoke with SLP to confirm recommendations.  Relevant Medications: colace BID, Protonix, Phos-Nak Labs: reviewed; Na 131, Phos 2.1  NUTRITION - FOCUSED PHYSICAL EXAM: Flowsheet Row Most Recent Value  Orbital Region Severe depletion  Upper Arm Region Severe depletion  Thoracic and Lumbar Region Severe depletion  Buccal Region Severe depletion  Temple Region Severe depletion  Clavicle Bone Region Severe depletion  Clavicle and  Acromion Bone Region Severe depletion  Scapular Bone Region Severe depletion  Dorsal Hand Severe depletion  Patellar Region Severe depletion  Anterior Thigh Region Severe depletion  Posterior Calf Region Severe depletion  Edema (RD Assessment) None  Hair Reviewed  Eyes Unable to assess   [pt asleep]  Mouth Unable to assess  [pt asleep]  Skin Reviewed  Nails Reviewed  [pale nail beds]     Diet Order:   Diet Order            DIET - DYS 1 Room service appropriate? Yes with Assist; Fluid consistency: Honey Thick  Diet effective now                EDUCATION NEEDS:  Not appropriate for education at this time  Skin:  Skin Assessment: Reviewed RN Assessment  Last BM:  05/24/20 - rectal pouch placed today, 3/15  Height:  Ht Readings from Last 1 Encounters:  05/20/20 5\' 4"  (1.626 m)   Weight:  Wt Readings from Last 1 Encounters:  05/20/20 37.8 kg   Ideal Body Weight:  54.5 kg  BMI:  Body mass index is 14.3 kg/m.  Estimated Nutritional Needs:  Kcal:  1500-1700 Protein:  70-85 grams Fluid:  >1.5 L  07/20/20, RD, LDN Registered Dietitian After Hours/Weekend Pager # in [place]

## 2020-05-25 NOTE — Progress Notes (Signed)
NAME:  Alexandra Mays, MRN:  [id number], DOB:  12-Jun-1950, LOS: 5 ADMISSION DATE:  05/20/2020, CONSULTATION DATE: 05/21/2020 REFERRING MD: Dr. Conchita Paris, CHIEF COMPLAINT: Headache and neck pain  Brief History:  70 year old female with COPD and recent NSTEMI who presented with sudden onset of headache and neck pain for few days, noted to have subarachnoid hemorrhage H/H 2, MF 3 due to ruptured right P-comm aneurysm, status post coiling  Past Medical History:   Past Medical History:  Diagnosis Date  . AAA (abdominal aortic aneurysm) (HCC)    AAA  . COPD (chronic obstructive pulmonary disease) (HCC)   . Dyspnea    with exertion  . Pure hyperglyceridemia      Significant Hospital Events:    Consults:  Neurosurgery PCCM  Procedures:  3/10 angiogram with coiling of right P-comm aneurysm  Significant Diagnostic Tests:  3/10 CT head: Subarachnoid hemorrhage 3/10 CT angiogram of head and neck: Ruptured right P-comm aneurysm 3/11 CT head: Diffuse areas of high attenuation throughout the bilateral sulci, likely a combination of residual subarachnoid hemorrhage and postprocedural contrast staining. 2. Persistent subarachnoid hemorrhage in the suprasellar region, with intraventricular hemorrhage in the bilateral lateral ventricles and third ventricles as above. 3. Mild hydrocephalus which has developed in the interim. 4. Developing hypodensities within the right insula and frontotemporal regions, likely representing acute cortical infarct. 5. Diffuse hypodensities throughout the periventricular white matter, likely representing an element of transependymal CSF flow and chronic small vessel ischemic change  Micro Data:  3/10 MRSA PCR negative 3/10 Covid and influenza PCR negative  Antimicrobials:     Interim History / Subjective:   Improved HR and BP overnight. Had an episode of agitation requiring soft restraints after pulling out her Cortrak. She remains somnolent this  morning but awakens to verbal stimulation. Denies having any pain.   Objective   Blood pressure 134/74, pulse (!) 55, temperature 97.8 F (36.6 C), temperature source Oral, resp. rate 16, height 5\' 4"  (1.626 m), weight 37.8 kg, SpO2 98 %.        Intake/Output Summary (Last 24 hours) at 05/25/2020 05/27/2020 Last data filed at 05/25/2020 0900 Gross per 24 hour  Intake 1870.79 ml  Output 1110 ml  Net 760.79 ml   Filed Weights   05/20/20 0920  Weight: 37.8 kg    Examination: General: Cachectic, sleepy, chronically ill appearing elderly female lying comfortably in bed, soft wrist restraints in place, NAD HEENT: Dry mucous membranes, NG tube in place Neuro: Remains sleepy but arousable to voice, requires slightly more aggressive verbal stimulation with reduced level of wakefulness after being woken compared yesterday, able to lift arms and legs on command, minimal response to questions Chest: CTAB, normal respiratory effort, no accessory muscle use Heart: RRR, normal S1S2, no MRG Extremities: No peripheral edema, 2+ bilateral radial pulses Abdomen: Soft, nontender, nondistended, no bowel sounds present Skin: No rashes  Resolved Hospital Problem list     Assessment & Plan:  Aneurysmal subarachnoid hemorrhage H/H 2, MF 3 due to ruptured P-comm aneurysm status post coiling -somnolence slightly worse today -repeat CT if no improvement in somnolence this afternoon -consider holding keppra if no change in sedation and CT without evidence of worsened hydrocephalus -correcting hyponatremia -cont. Nimodipine q4h -cont. Keppra for sz ppx -maintain sys BP 120-160 -elevate HOB to 30 degrees -q4h neuro checks  Mild to moderate hydrocephalus -f/u CT if no improvement in somnolence today  Chronic systolic congestive heart failure with EF 20 to 25% -cont to hold coreg  and losartan -careful with volume administration -cont simvastatin -strict I&O  Hyponatremia/hypokalemia -Na 131 on  3/14 -K repleted -IV NS -f/u BMP  COPD, not in exacerbation -cont. Breo Ellipta, Incruse Ellipta QD -cont albuterol PRN  Malnutrition Pt is cachectic on exam, clear muscle wasting with prominence of the clavicles and atrophic quadriceps -nocturnal tube feeds -scheduled meals -ST to assess for possibility of thinner liquids -f/u outpatient for early satiety and postprandial abdominal pain -cont. famotidine  Constipation -No bowel movement since 3/10 but poor PO intake -Tube feeds, encourage TID meals -Colace BID  Dementia -not on medication at home -cont. supportive care - frequent orientation, daytime meals, pain mangement, melatonin QHS  Best practice (evaluated daily)  Diet: Tube feeds Pain/Anxiety/Delirium protocol (if indicated): Tylenol PRN VAP protocol (if indicated): N/A DVT prophylaxis: SCDs GI prophylaxis: Famotidine Glucose control: SSI Mobility: As tolerated Disposition: Full code  Goals of Care:  Last date of multidisciplinary goals of care discussion: Per primary team Code Status: Full code  Labs   CBC: Recent Labs  Lab 05/20/20 1044 05/20/20 1055 05/23/20 0428  WBC 9.4  --  12.8*  NEUTROABS 7.4  --   --   HGB 12.0 11.9* 12.9  HCT 35.8* 35.0* 36.6  MCV 95.0  --  88.0  PLT 151  --  205    Basic Metabolic Panel: Recent Labs  Lab 05/20/20 1044 05/20/20 1055 05/23/20 0428 05/24/20 1256 05/24/20 1809  NA 132* 135 127*  --  131*  K 3.7 3.7 2.9*  --  3.7  CL 102 100 96*  --  106  CO2 25  --  21*  --  19*  GLUCOSE 93 85 90  --  98  BUN 17 21 14   --  19  CREATININE 0.83 0.70 0.55  --  0.63  CALCIUM 8.4*  --  8.5*  --  8.0*  MG  --   --  1.8 2.0 1.9  PHOS  --   --  2.2* 2.7 2.4*   GFR: Estimated Creatinine Clearance: 38.5 mL/min (by C-G formula based on SCr of 0.63 mg/dL). Recent Labs  Lab 05/20/20 1044 05/23/20 0428  WBC 9.4 12.8*    Liver Function Tests: Recent Labs  Lab 05/20/20 1044  AST 26  ALT 17  ALKPHOS 57  BILITOT  0.5  PROT 5.5*  ALBUMIN 3.0*   No results for input(s): LIPASE, AMYLASE in the last 168 hours. No results for input(s): AMMONIA in the last 168 hours.  ABG    Component Value Date/Time   PHART 7.412 05/14/2020 1347   PCO2ART 36.8 05/14/2020 1347   PO2ART 82 (L) 05/14/2020 1347   HCO3 23.7 05/14/2020 1354   TCO2 26 05/20/2020 1055   ACIDBASEDEF 1.0 05/14/2020 1354   O2SAT 67.0 05/14/2020 1354     Coagulation Profile: Recent Labs  Lab 05/20/20 1044 05/20/20 1836  INR 1.2 1.2    Cardiac Enzymes: No results for input(s): CKTOTAL, CKMB, CKMBINDEX, TROPONINI in the last 168 hours.  HbA1C: Hgb A1c MFr Bld  Date/Time Value Ref Range Status  05/14/2020 02:40 AM 5.8 (H) 4.8 - 5.6 % Final    Comment:    (NOTE) Pre diabetes:          5.7%-6.4%  Diabetes:              >6.4%  Glycemic control for   <7.0% adults with diabetes     CBG: Recent Labs  Lab 05/24/20 1518 05/24/20 1934 05/24/20 2340 05/25/20 0314  05/25/20 0741  GLUCAP 100* 106* 113* 123* 122*     Past Medical History:  She,  has a past medical history of AAA (abdominal aortic aneurysm) (HCC), COPD (chronic obstructive pulmonary disease) (HCC), Dyspnea, and Pure hyperglyceridemia.   Surgical History:   Past Surgical History:  Procedure Laterality Date  . ABDOMINAL AORTIC ANEURYSM REPAIR N/A 07/24/2016   Procedure: ABDOMINAL AORTIC ANEURYSM  REPAIR;  Surgeon: Sherren Kerns, MD;  Location: [facility] OR;  Service: Vascular;  Laterality: N/A;  . cardiac stenting  2007  . ENDARTERECTOMY Left 07/24/2016   Procedure: AORTA TO LEFT ILIAC ENDARTERECTOMY;  Surgeon: Sherren Kerns, MD;  Location: [facility] OR;  Service: Vascular;  Laterality: Left;  . ENDARTERECTOMY FEMORAL Right 07/24/2016   Procedure: AORTA TO RIGHT FEMORAL ENDARTERECTOMY;  Surgeon: Sherren Kerns, MD;  Location: [facility] OR;  Service: Vascular;  Laterality: Right;  . IR ANGIO INTRA EXTRACRAN SEL INTERNAL CAROTID BILAT MOD SED  05/20/2020  . IR ANGIO  VERTEBRAL SEL VERTEBRAL UNI R MOD SED  05/20/2020  . IR ANGIOGRAM FOLLOW UP STUDY  05/20/2020  . IR ANGIOGRAM FOLLOW UP STUDY  05/20/2020  . IR CT HEAD LTD  05/20/2020  . IR TRANSCATH/EMBOLIZ  05/20/2020  . RADIOLOGY WITH ANESTHESIA N/A 05/20/2020   Procedure: IR WITH ANESTHESIA - ANEURYSM;  Surgeon: Lisbeth Renshaw, MD;  Location: [facility] OR;  Service: Radiology;  Laterality: N/A;  . RIGHT/LEFT HEART CATH AND CORONARY ANGIOGRAPHY N/A 05/14/2020   Procedure: RIGHT/LEFT HEART CATH AND CORONARY ANGIOGRAPHY;  Surgeon: Tonny Bollman, MD;  Location: [facility] INVASIVE CV LAB;  Service: Cardiovascular;  Laterality: N/A;  . TOE SURGERY Left    3 rd toe Hammer toe     Social History:   reports that she has been smoking cigarettes. She has a 30.00 pack-year smoking history. She has never used smokeless tobacco. She reports that she does not drink alcohol and does not use drugs.   Family History:  Her family history includes Stroke in her mother.   Allergies Allergies  Allergen Reactions  . No Known Allergies      Home Medications  Prior to Admission medications   Medication Sig Start Date End Date Taking? Authorizing Provider  albuterol (PROVENTIL HFA;VENTOLIN HFA) 108 (90 Base) MCG/ACT inhaler Inhale 2 puffs into the lungs every 6 (six) hours as needed for wheezing or shortness of breath.   Yes [provider]  albuterol (PROVENTIL) (2.5 MG/3ML) 0.083% nebulizer solution Take 3 mLs (2.5 mg total) by nebulization every 6 (six) hours as needed for wheezing or shortness of breath. 11/21/19  Yes Hunsucker, Lesia Sago, MD  BREO ELLIPTA 100-25 MCG/INH AEPB Inhale 1 puff into the lungs daily. 04/16/20  Yes [provider]  carvedilol (COREG) 3.125 MG tablet Take 1 tablet (3.125 mg total) by mouth 2 (two) times daily with a meal. 05/16/20  Yes Zannie Cove, MD  ibuprofen (ADVIL,MOTRIN) 200 MG tablet Take 800 mg by mouth every 6 (six) hours as needed for mild pain.   Yes [provider]   losartan (COZAAR) 25 MG tablet Take 0.5 tablets (12.5 mg total) by mouth daily. 05/16/20  Yes Zannie Cove, MD  Multiple Vitamin (MULTIVITAMIN WITH MINERALS) TABS tablet Take 1 tablet by mouth daily.   Yes [provider]  simvastatin (ZOCOR) 20 MG tablet Take 20 mg by mouth every morning.    Yes [provider]  Tiotropium Bromide Monohydrate 2.5 MCG/ACT AERS Inhale 2 puffs into the lungs daily.   Yes [provider]  polyethylene glycol (MIRALAX / GLYCOLAX) packet Take 17 g by mouth daily. Patient not taking: No sig reported 06/23/16   Forest BeckerPetit, Cionna Collantes, MD    Jori MollNicholas Samvel Zinn, Medical Student 05/25/2020 , 3:29 PM

## 2020-05-25 NOTE — Progress Notes (Signed)
Physical Therapy Treatment Patient Details Name: Alexandra Mays MRN: [id number] DOB: 1950/04/18 Today's Date: 05/25/2020    History of Present Illness 70 y.o. female who presented 05/20/20 with R neck pain and a headache. 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 Pt recently discharged from hospital 05/16/20 with NSTEMI. 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 PMH: dyspnea, COPD, AAA, pure hyperglyceridemia.    PT Comments    Pt progressing towards her physical therapy goals. Ambulating 3 ft, then 6 ft with a walker, mod assist for stability, and close chair follow. Continues with lethargy, decreased cognition, poor standing balance, and weakness. Continue to recommend comprehensive inpatient rehab (CIR) for post-acute therapy needs.   Follow Up Recommendations  CIR;Supervision/Assistance - 24 hour     Equipment Recommendations  3in1 (PT);Wheelchair (measurements PT);Wheelchair cushion (measurements PT)    Recommendations for Other Services       Precautions / Restrictions Precautions Precautions: Fall;Other (comment) Precaution Comments: NGT, mitts Restrictions Weight Bearing Restrictions: No    Mobility  Bed Mobility Overal bed mobility: Needs Assistance Bed Mobility: Supine to Sit     Supine to sit: Max assist     General bed mobility comments: Decreased initiation, assist for BLE's and trunk to upright    Transfers Overall transfer level: Needs assistance Equipment used: Rolling walker (2 wheeled) Transfers: Sit to/from [employer] Sit to Stand: Mod assist;+2 physical assistance Stand pivot transfers: Mod assist;+2 safety/equipment       General transfer comment: ModA + 2 to stand from edge of bed and recliner, guidance for hand placement.  Ambulation/Gait Ambulation/Gait assistance: Mod assist;+2  safety/equipment Gait Distance (Feet): 9 Feet (3", 6") Assistive device: Rolling walker (2 wheeled) Gait Pattern/deviations: Step-through pattern;Decreased stride length;Trunk flexed;Narrow base of support Gait velocity: decreased Gait velocity interpretation: <1.8 ft/sec, indicate of risk for recurrent falls General Gait Details: Cues for stepping initiation, manual assist for walker negotiation, pt utilizing narrow BOS with increased trunk flexion. Close chair follow utilized. ModA for stability   Stairs             Wheelchair Mobility    Modified Rankin (Stroke Patients Only) Modified Rankin (Stroke Patients Only) Pre-Morbid Rankin Score: No significant disability Modified Rankin: Moderately severe disability     Balance Overall balance assessment: Needs assistance Sitting-balance support: Feet supported Sitting balance-Leahy Scale: Fair     Standing balance support: During functional activity;Bilateral upper extremity supported Standing balance-Leahy Scale: Poor                              Cognition Arousal/Alertness: Awake/alert Behavior During Therapy: Flat affect Overall Cognitive Status: Impaired/Different from baseline Area of Impairment: Memory;Awareness;Following commands;Safety/judgement                   Current Attention Level: Sustained Memory: Decreased recall of precautions;Decreased short-term memory Following Commands: Follows one step commands inconsistently Safety/Judgement: Decreased awareness of deficits;Decreased awareness of safety Awareness: Intellectual   General Comments: History of dementia. Able to recall great grandchildren's names with increased time      Exercises      General Comments        Pertinent Vitals/Pain Pain Assessment: Faces Faces Pain Scale: Hurts even more Pain Location: headache Pain Descriptors / Indicators: Headache Pain Intervention(s): Monitored during session    Home Living  Prior Function            PT Goals (current goals can now be found in the care plan section) Acute Rehab PT Goals Patient Stated Goal: none stated Potential to Achieve Goals: Good    Frequency    Min 4X/week      PT Plan Current plan remains appropriate    Co-evaluation              AM-PAC PT "6 Clicks" Mobility   Outcome Measure  Help needed turning from your back to your side while in a flat bed without using bedrails?: A Lot Help needed moving from lying on your back to sitting on the side of a flat bed without using bedrails?: A Lot Help needed moving to and from a bed to a chair (including a wheelchair)?: A Lot Help needed standing up from a chair using your arms (e.g., wheelchair or bedside chair)?: A Lot Help needed to walk in hospital room?: A Lot Help needed climbing 3-5 steps with a railing? : Total 6 Click Score: 11    End of Session Equipment Utilized During Treatment: Gait belt Activity Tolerance: Patient tolerated treatment well Patient left: in chair;with call bell/phone within reach;with chair alarm set Nurse Communication: Mobility status PT Visit Diagnosis: Unsteadiness on feet (R26.81);Other abnormalities of gait and mobility (R26.89);Muscle weakness (generalized) (M62.81);Difficulty in walking, not elsewhere classified (R26.2);Other symptoms and signs involving the nervous system (R29.898)     Time: 1275-1700 PT Time Calculation (min) (ACUTE ONLY): 28 min  Charges:  $Gait Training: 8-22 mins $Therapeutic Activity: 8-22 mins                     Lillia Pauls, PT, DPT Acute Rehabilitation Services Pager [phone] Office [phone]    [name] 05/25/2020, 2:40 PM

## 2020-05-25 NOTE — Progress Notes (Signed)
  Speech Language Pathology Treatment: Dysphagia  Patient Details Name: Alexandra Mays MRN: [id number] DOB: 08-09-1950 Today's Date: 05/25/2020 Time: 6789-3810 SLP Time Calculation (min) (ACUTE ONLY): 31 min  Assessment / Plan / Recommendation Clinical Impression  Pt in chair eating lunch with granddaughter when therapist arrived. Reviewed prior session and plan with pt and family. Alexandra Mays has decreased reserve and needs encouragement to do most activities.  Thin liquids recommended after initial assessment 3/11. She was placed on honey thick liquids due to lethargy and concern for aspiration and appeared to tolerate honey thick liquids. With thin liquids she demonstrated incoordinated respiration and swallow, throat clearing suspicious for airway intrusion with SAH and history COPD. Pt consumed nectar thick without s/s aspiration. Educated pt and daughter recommendation for upgrade to nectar thick, continue puree and water protocol (allowed to have thin water -water only- 30 min after meals, after oral care, with full supervision or up to 1-2 oz).     HPI HPI: 70 y.o. 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 PMHx recent NSTEMI, AAA, COPD. Lives with granddaughter Alexandra Mays. Dx'd with memory disorder five years ago (needs reminders re: appts, meds; Alexandra Mays leaves sticky notes with reminders around the house) but still drives, picks grandchildren up from school      SLP Plan  Continue with current plan of care       Recommendations  Diet recommendations: Dysphagia 1 (puree);Nectar-thick liquid;Other(comment) (water protocol) Liquids provided via: Cup Medication Administration: Crushed with puree Supervision: Patient able to self feed;Staff to assist with self feeding Compensations: Slow rate;Small sips/bites Postural Changes and/or Swallow Maneuvers: Seated upright 90 degrees                Oral Care  Recommendations: Oral care BID Follow up Recommendations: Skilled Nursing facility SLP Visit Diagnosis: Dysphagia, unspecified (R13.10) Plan: Continue with current plan of care       GO                Alexandra Mays 05/25/2020, 1:44 PM

## 2020-05-25 NOTE — Progress Notes (Signed)
Neurosurgery Service Progress Note  Subjective: No acute events overnight, no complaints today   Objective: Vitals:   05/25/20 1100 05/25/20 1200 05/25/20 1300 05/25/20 1400  BP: 140/60 114/61 (!) 142/67 134/61  Pulse: (!) 54 66 62 62  Resp: 17 16 19 18   Temp: 97.7 F (36.5 C)     TempSrc: Oral     SpO2: 100% 95% 98% 98%  Weight:      Height:        Physical Exam: Mildly somnolent but Ox3, PERRL, EOMI, FS, TM, Strength 5/5 x4, SILTx4  Assessment & Plan: 70 y.o. woman w/ aSAH PBD6 s/p R PComm coiling, recovering well.  -continue nimodipine, levetiracetam -TCDs WNL yesterday afternoon  62  05/25/20 2:40 PM

## 2020-05-26 ENCOUNTER — Inpatient Hospital Stay (HOSPITAL_COMMUNITY): Payer: Medicare Other

## 2020-05-26 DIAGNOSIS — I255 Ischemic cardiomyopathy: Secondary | ICD-10-CM | POA: Diagnosis not present

## 2020-05-26 DIAGNOSIS — E781 Pure hyperglyceridemia: Secondary | ICD-10-CM | POA: Insufficient documentation

## 2020-05-26 DIAGNOSIS — G9341 Metabolic encephalopathy: Secondary | ICD-10-CM | POA: Diagnosis not present

## 2020-05-26 DIAGNOSIS — I608 Other nontraumatic subarachnoid hemorrhage: Secondary | ICD-10-CM | POA: Diagnosis not present

## 2020-05-26 DIAGNOSIS — J449 Chronic obstructive pulmonary disease, unspecified: Secondary | ICD-10-CM | POA: Insufficient documentation

## 2020-05-26 DIAGNOSIS — I609 Nontraumatic subarachnoid hemorrhage, unspecified: Secondary | ICD-10-CM | POA: Diagnosis not present

## 2020-05-26 DIAGNOSIS — R0600 Dyspnea, unspecified: Secondary | ICD-10-CM | POA: Insufficient documentation

## 2020-05-26 LAB — BASIC METABOLIC PANEL
Anion gap: 8 (ref 5–15)
BUN: 12 mg/dL (ref 8–23)
CO2: 21 mmol/L — ABNORMAL LOW (ref 22–32)
Calcium: 8.3 mg/dL — ABNORMAL LOW (ref 8.9–10.3)
Chloride: 106 mmol/L (ref 98–111)
Creatinine, Ser: 0.59 mg/dL (ref 0.44–1.00)
GFR, Estimated: >60 mL/min (ref >60)
Glucose, Bld: 142 mg/dL — ABNORMAL HIGH (ref 70–99)
Potassium: 3 mmol/L — ABNORMAL LOW (ref 3.5–5.1)
Sodium: 135 mmol/L (ref 135–145)

## 2020-05-26 LAB — MAGNESIUM: Magnesium: 1.7 mg/dL (ref 1.7–2.4)

## 2020-05-26 LAB — GLUCOSE, CAPILLARY
Glucose-Capillary: 127 mg/dL — ABNORMAL HIGH (ref 70–99)
Glucose-Capillary: 119 mg/dL — ABNORMAL HIGH (ref 70–99)
Glucose-Capillary: 154 mg/dL — ABNORMAL HIGH (ref 70–99)
Glucose-Capillary: 102 mg/dL — ABNORMAL HIGH (ref 70–99)
Glucose-Capillary: 136 mg/dL — ABNORMAL HIGH (ref 70–99)
Glucose-Capillary: 142 mg/dL — ABNORMAL HIGH (ref 70–99)

## 2020-05-26 LAB — PHOSPHORUS: Phosphorus: 2.6 mg/dL (ref 2.5–4.6)

## 2020-05-26 MED ORDER — MAGNESIUM SULFATE 2 GM/50ML IV SOLN
2.0000 g | Freq: Once | INTRAVENOUS | Status: AC
  Administered 2020-05-26: 2 g via INTRAVENOUS
  Filled 2020-05-26: qty 50

## 2020-05-26 MED ORDER — POTASSIUM CHLORIDE 20 MEQ PO PACK
20.0000 meq | PACK | Freq: Once | ORAL | Status: AC
  Administered 2020-05-26: 20 meq
  Filled 2020-05-26: qty 1

## 2020-05-26 MED ORDER — PHENOL 1.4 % MT LIQD
1.0000 | OROMUCOSAL | Status: DC | PRN
  Filled 2020-05-26: qty 177

## 2020-05-26 MED ORDER — POTASSIUM CHLORIDE 20 MEQ PO PACK
40.0000 meq | PACK | Freq: Once | ORAL | Status: AC
  Administered 2020-05-26: 40 meq
  Filled 2020-05-26: qty 2

## 2020-05-26 MED ORDER — POTASSIUM & SODIUM PHOSPHATES 280-160-250 MG PO PACK
2.0000 | PACK | Freq: Once | ORAL | Status: AC
  Administered 2020-05-26: 2
  Filled 2020-05-26: qty 2

## 2020-05-26 MED ORDER — NUTRISOURCE FIBER PO PACK
1.0000 | PACK | Freq: Two times a day (BID) | ORAL | Status: DC
  Administered 2020-05-26 – 2020-06-03 (×17): 1
  Filled 2020-05-26 (×21): qty 1

## 2020-05-26 NOTE — Progress Notes (Signed)
NAME:  Alexandra Mays, MRN:  [id number], DOB:  12/20/1950, LOS: 6 ADMISSION DATE:  05/20/2020, CONSULTATION DATE: 05/21/2020 REFERRING MD: Dr. 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 AAA (abdominal aortic aneurysm) (HCC)    AAA  . Abdominal aortic aneurysm (AAA) without rupture (HCC) 04/21/2015  . Acute respiratory failure with hypoxia (HCC) 05/14/2020  . Cigarette smoker 03/29/2015  . COPD (chronic obstructive pulmonary disease) (HCC)   . 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 Depressive disorder 02/17/2015  . Dyslipidemia 03/29/2015  . Dyspnea    with exertion  . Essential hypertension 05/14/2020  . Hyperlipemia 05/14/2020  . Leukocytosis 05/14/2020  . Mild cognitive impairment with memory loss 02/17/2015  . NSTEMI (non-ST elevated myocardial infarction) (HCC) 05/13/2020  . Protein-calorie malnutrition, severe 05/25/2020  . Pure hyperglyceridemia   . S/P aortic aneurysm repair 07/24/2016  . Stress-induced cardiomyopathy   . Subarachnoid hemorrhage due to cerebral aneurysm (HCC) 05/20/2020  . Tobacco abuse 05/14/2020     Significant Hospital Events:    Consults:  Neurosurgery PCCM  Procedures:  3/10 angiogram with coiling of right P-comm aneurysm  Significant Diagnostic Tests:  3/10 CT head: Subarachnoid hemorrhage 3/10 CT angiogram of head and neck: Ruptured right P-comm aneurysm 3/11 CT head: Diffuse areas of high attenuation throughout the bilateral sulci, likely a combination of residual subarachnoid hemorrhage and postprocedural contrast staining. 2. Persistent subarachnoid hemorrhage in the suprasellar region, with intraventricular  hemorrhage in the bilateral lateral ventricles and third ventricles as above. 3. Mild hydrocephalus which has developed in the interim. 4. Developing hypodensities within the right insula and frontotemporal regions, likely representing acute cortical infarct. 5. Diffuse hypodensities throughout the periventricular white matter, likely representing an element of transependymal CSF flow and chronic small vessel ischemic change  Micro Data:  3/10 MRSA PCR negative 3/10 Covid and influenza PCR negative  Antimicrobials:     Interim History / Subjective:   Agitation overnight requiring soft restraints. Pt more alert this AM, sitting up in bed with eyes open, conversing clearly and appropriately. Reports that her neck pain and HA are unchanged over the last several days.  Episode of hypoxia to 60s after moving during a bath. Oxygenation improved with 4 LPM O2. Later was able to be weaned off oxygen with continued SpO2 ~100.   Objective   Blood pressure 117/62, pulse 69, temperature 98.4 F (36.9 C), temperature source Axillary, resp. rate (!) 21, height 5\' 4"  (1.626 m), weight 41.8 kg, SpO2 96 %.        Intake/Output Summary (Last 24 hours) at 05/26/2020 1257 Last data filed at 05/26/2020 1200 Gross per 24 hour  Intake 3043.32 ml  Output 2055 ml  Net 988.32 ml   Filed Weights   05/20/20 0920 05/26/20 0458  Weight: 37.8 kg 41.8 kg    Examination: General: Cachectic, awake and alert, chronically ill appearing elderly female lying comfortably in bed, soft wrist restraints in place, NAD HEENT: Dry mucous membranes, NG tube in place Neuro: awake with eyes open upon entering the room, EOMI, alert and oriented to person place and situation, able to lift arms and legs on command, responds with longer sentences  and more full thoughts than prior days Chest: CTAB, normal respiratory effort, no accessory muscle use Heart: RRR, normal S1S2, no MRG Extremities: No peripheral edema, 2+  bilateral radial pulses Abdomen: Soft, nontender, nondistended, positive bowel sounds   Resolved Hospital Problem list     Assessment & Plan:   Aneurysmal subarachnoid hemorrhage H/H 2, MF 3 due to ruptured P-comm aneurysm status post coiling -improved mental status this AM -TCD normal this morning, low risk for vasospasm given low volume bleed, old age, and normal TCDs since time of bleed -possible transfer to floor on Friday if no worsening clinical status and normal TCD on Friday morning. -cont. Nimodipine q4h -cont. Keppra for sz ppx -maintain sys BP 120-160 -elevate HOB to 30 degrees -q4h neuro checks  ICU Delirium -waxing/waning somnolence and orientation with hyperactivity overnight -some component of thalamic irritation/injury may be playing a role in somnolence -delirium precautions - frequent orientation, scheduled meals, reduce tubing and wires present on patient as able to, pain control  Mild to moderate hydrocephalus -no CT required with improving mental status  Chronic systolic congestive heart failure with EF 20 to 25% -restart coreg, per low risk for vasospasm as above and episode of hypoxia on minimal exertion this AM -clinically remains euvolemic -careful with volume administration -cont simvastatin -strict I&O  Hyponatremia/hypokalemia -Na 135 this AM -K low at 3.0, repleted -IV NS -f/u BMP  COPD, not in exacerbation -cont. Breo Ellipta, Incruse Ellipta QD -cont albuterol PRN  Malnutrition Pt is cachectic on exam, clear muscle wasting with prominence of the clavicles and atrophic quadriceps -placing new cortrak today -nocturnal tube feeds -plan for inpatient endoscopy while on the floor prior to discharge to rehab facility -cont. famotidine  Diarrhea -cont tube feeds -fiber supplement -hold laxatives/stool softeners  Dementia -not on medication at home -cont. supportive care - frequent orientation, daytime meals, pain mangement, melatonin  QHS  Best practice (evaluated daily)  Diet: Tube feeds Pain/Anxiety/Delirium protocol (if indicated): Tylenol PRN VAP protocol (if indicated): N/A DVT prophylaxis: SCDs GI prophylaxis: Famotidine Glucose control: SSI Mobility: As tolerated Disposition: Full code  Goals of Care:  Last date of multidisciplinary goals of care discussion: Per primary team Code Status: Full code  Labs   CBC: Recent Labs  Lab 05/20/20 1044 05/20/20 1055 05/23/20 0428  WBC 9.4  --  12.8*  NEUTROABS 7.4  --   --   HGB 12.0 11.9* 12.9  HCT 35.8* 35.0* 36.6  MCV 95.0  --  88.0  PLT 151  --  205    Basic Metabolic Panel: Recent Labs  Lab 05/20/20 1044 05/20/20 1055 05/23/20 0428 05/23/20 0428 05/24/20 1256 05/24/20 1809 05/25/20 0720 05/25/20 1644 05/26/20 0834  NA 132* 135 127*  --   --  131*  --   --  135  K 3.7 3.7 2.9*  --   --  3.7  --   --  3.0*  CL 102 100 96*  --   --  106  --   --  106  CO2 25  --  21*  --   --  19*  --   --  21*  GLUCOSE 93 85 90  --   --  98  --   --  142*  BUN 17 21 14   --   --  19  --   --  12  CREATININE 0.83 0.70 0.55  --   --  0.63  --   --  0.59  CALCIUM 8.4*  --  8.5*  --   --  8.0*  --   --  8.3*  MG  --   --  1.8   < > 2.0 1.9 2.0 1.8 1.7  PHOS  --   --  2.2*   < > 2.7 2.4* 2.1* 2.9 2.6   < > = values in this interval not displayed.   GFR: Estimated Creatinine Clearance: 42.6 mL/min (by C-G formula based on SCr of 0.59 mg/dL). Recent Labs  Lab 05/20/20 1044 05/23/20 0428  WBC 9.4 12.8*    Liver Function Tests: Recent Labs  Lab 05/20/20 1044  AST 26  ALT 17  ALKPHOS 57  BILITOT 0.5  PROT 5.5*  ALBUMIN 3.0*   No results for input(s): LIPASE, AMYLASE in the last 168 hours. No results for input(s): AMMONIA in the last 168 hours.  ABG    Component Value Date/Time   PHART 7.412 05/14/2020 1347   PCO2ART 36.8 05/14/2020 1347   PO2ART 82 (L) 05/14/2020 1347   HCO3 23.7 05/14/2020 1354   TCO2 26 05/20/2020 1055   ACIDBASEDEF 1.0  05/14/2020 1354   O2SAT 67.0 05/14/2020 1354     Coagulation Profile: Recent Labs  Lab 05/20/20 1044 05/20/20 1836  INR 1.2 1.2    Cardiac Enzymes: No results for input(s): CKTOTAL, CKMB, CKMBINDEX, TROPONINI in the last 168 hours.  HbA1C: Hgb A1c MFr Bld  Date/Time Value Ref Range Status  05/14/2020 02:40 AM 5.8 (H) 4.8 - 5.6 % Final    Comment:    (NOTE) Pre diabetes:          5.7%-6.4%  Diabetes:              >6.4%  Glycemic control for   <7.0% adults with diabetes     CBG: Recent Labs  Lab 05/25/20 1922 05/25/20 2320 05/26/20 0325 05/26/20 0735 05/26/20 1137  GLUCAP 133* 147* 127* 119* 154*     Past Medical History:  She,  has a past medical history of AAA (abdominal aortic aneurysm) (HCC), Abdominal aortic aneurysm (AAA) without rupture (HCC) (04/21/2015), Acute respiratory failure with hypoxia (HCC) (05/14/2020), Cigarette smoker (03/29/2015), COPD (chronic obstructive pulmonary disease) (HCC), 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   Surgical History:   Past Surgical History:  Procedure Laterality Date  . ABDOMINAL AORTIC ANEURYSM REPAIR N/A 07/24/2016   Procedure: ABDOMINAL AORTIC ANEURYSM  REPAIR;  Surgeon: Sherren Kerns, MD;  Location: [facility] OR;  Service: Vascular;  Laterality: N/A;  . cardiac stenting  2007  . ENDARTERECTOMY Left 07/24/2016   Procedure: AORTA TO LEFT ILIAC ENDARTERECTOMY;  Surgeon: Sherren Kerns, MD;   Location: [facility] OR;  Service: Vascular;  Laterality: Left;  . ENDARTERECTOMY FEMORAL Right 07/24/2016   Procedure: AORTA TO RIGHT FEMORAL ENDARTERECTOMY;  Surgeon: Sherren Kerns, MD;  Location: [facility] OR;  Service: Vascular;  Laterality: Right;  . IR ANGIO INTRA EXTRACRAN SEL INTERNAL CAROTID BILAT MOD SED  05/20/2020  . IR ANGIO VERTEBRAL SEL VERTEBRAL UNI R MOD SED  05/20/2020  . IR ANGIOGRAM FOLLOW UP STUDY  05/20/2020  . IR ANGIOGRAM FOLLOW UP STUDY  05/20/2020  . IR CT HEAD LTD  05/20/2020  . IR TRANSCATH/EMBOLIZ  05/20/2020  . RADIOLOGY WITH ANESTHESIA N/A 05/20/2020   Procedure: IR WITH ANESTHESIA - ANEURYSM;  Surgeon: Lisbeth Renshaw, MD;  Location: [facility] OR;  Service: Radiology;  Laterality: N/A;  . RIGHT/LEFT HEART CATH AND CORONARY ANGIOGRAPHY N/A 05/14/2020   Procedure: RIGHT/LEFT HEART CATH AND CORONARY ANGIOGRAPHY;  Surgeon: Tonny Bollman, MD;  Location: [facility] INVASIVE CV LAB;  Service: Cardiovascular;  Laterality: N/A;  . TOE SURGERY Left    3 rd toe Hammer toe     Social History:   reports that she has been smoking cigarettes. She has a 30.00 pack-year smoking history. She has never used smokeless tobacco. She reports that she does not drink alcohol and does not use drugs.   Family History:  Her family history includes Stroke in her mother.   Allergies Allergies  Allergen Reactions  . No Known Allergies      Home Medications  Prior to Admission medications   Medication Sig Start Date End Date Taking? Authorizing Provider  albuterol (PROVENTIL HFA;VENTOLIN HFA) 108 (90 Base) MCG/ACT inhaler Inhale 2 puffs into the lungs every 6 (six) hours as needed for wheezing or shortness of breath.   Yes [provider]  albuterol (PROVENTIL) (2.5 MG/3ML) 0.083% nebulizer solution Take 3 mLs (2.5 mg total) by nebulization every 6 (six) hours as needed for wheezing or shortness of breath. 11/21/19  Yes Hunsucker, Lesia Sago, MD  BREO ELLIPTA 100-25 MCG/INH AEPB Inhale 1 puff into the  lungs daily. 04/16/20  Yes [provider]  carvedilol (COREG) 3.125 MG tablet Take 1 tablet (3.125 mg total) by mouth 2 (two) times daily with a meal. 05/16/20  Yes Zannie Cove, MD  ibuprofen (ADVIL,MOTRIN) 200 MG tablet Take 800 mg by mouth every 6 (six) hours as needed for mild pain.   Yes [provider]  losartan (COZAAR) 25 MG tablet Take 0.5 tablets (12.5 mg total) by mouth daily. 05/16/20  Yes Zannie Cove, MD  Multiple Vitamin (MULTIVITAMIN WITH MINERALS) TABS tablet Take 1 tablet by mouth daily.   Yes [provider]  simvastatin (ZOCOR) 20 MG tablet Take 20 mg by mouth every morning.    Yes [provider]  Tiotropium Bromide Monohydrate 2.5 MCG/ACT AERS Inhale 2 puffs into the lungs daily.   Yes [provider]  polyethylene glycol (MIRALAX / GLYCOLAX) packet Take 17 g by mouth daily. Patient not taking: No sig reported 06/23/16   Forest Becker, MD    Jori Moll, Medical Student 05/26/2020 , 12:57 PM

## 2020-05-26 NOTE — Progress Notes (Signed)
Transcranial Doppler  Date POD PCO2 HCT BP  MCA ACA PCA OPHT SIPH VERT Basilar       Right  Left                                       3/14 MR     Right  Left   55  41   -52  -33   38  29   21  17   17  24    -23  -27   -24      3/16 GC     Right  Left   72  38   -18  -42   40  24   20  16   30  24    -20  -25   -20            Right  Left                                             Right  Left                                            Right  Left                                            Right  Left                                        MCA = Middle Cerebral Artery      OPHT = Opthalmic Artery     BASILAR = Basilar Artery   ACA = Anterior Cerebral Artery     SIPH = Carotid Siphon PCA = Posterior Cerebral Artery   VERT = Verterbral Artery                   Normal MCA = 62+\-12 ACA = 50+\-12 PCA = 42+\-23   05/26/20 11:57 AM RVT

## 2020-05-26 NOTE — Procedures (Signed)
Cortrak  Person Inserting Tube:  Renie Ora, RD Tube Type:  Cortrak - 43 inches Tube Location:  Right nare Initial Placement:  Stomach Secured by: Bridle Technique Used to Measure Tube Placement:  Documented cm marking at nare/ corner of mouth Cortrak Secured At:  65 cm Procedure Comments:  Cortrak Tube Team Note:  Consult received to place a Cortrak feeding tube.   No x-ray is required. RN may begin using tube.   If the tube becomes dislodged please keep the tube and contact the Cortrak team at [email or web address] (password [email or web address]) for replacement.  If after hours and replacement cannot be delayed, place a NG tube and confirm placement with an abdominal x-ray.      Trenton Gammon, MS, RD, LDN, CNSC Inpatient Clinical Dietitian RD pager # available in [facility]  After hours/weekend pager # available in [facility]

## 2020-05-26 NOTE — Progress Notes (Signed)
Occupational Therapy Treatment Patient Details Name: Alexandra Mays MRN: [id number] DOB: Feb 04, 1951 Today's Date: 05/26/2020    History of present illness 70 y.o. female who presented 05/20/20 with R neck pain and a headache. 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 Pt recently discharged from hospital 05/16/20 with NSTEMI. 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 PMH: dyspnea, COPD, AAA, pure hyperglyceridemia.   OT comments  Pt more alert this session and motivated to progress from chair surface. Pt following 1 step commands 50% of session. Pt oriented to self only. Pt lacks awareness to lines/ leads or reason for tubes. Pt making a face when educated on reason for foley and external rectal pouch. Recommendation for CIR.    Follow Up Recommendations  CIR    Equipment Recommendations  3 in 1 bedside commode    Recommendations for Other Services Rehab consult    Precautions / Restrictions Precautions Precautions: Fall;Other (comment) Precaution Comments: NGT, wrist restraints       Mobility Bed Mobility Overal bed mobility: Needs Assistance Bed Mobility: Sit to Supine Rolling: Max assist     Sit to supine: Max assist   General bed mobility comments: pt requires (A) to sequence task and place bil LE on bed surface    Transfers Overall transfer level: Needs assistance Equipment used: Rolling walker (2 wheeled) Transfers: Sit to/from Stand Sit to Stand: Mod assist Stand pivot transfers: Max assist       General transfer comment: pt completed sit<>stand x4 during session. pt needed increased cueing and (A) with each transfer.    Balance Overall balance assessment: Needs assistance Sitting-balance support: Bilateral upper extremity supported;Feet supported Sitting balance-Leahy Scale: Fair     Standing balance support: Bilateral  upper extremity supported;During functional activity Standing balance-Leahy Scale: Poor Standing balance comment: reliant on RW and cues from therapist                           ADL either performed or assessed with clinical judgement   ADL Overall ADL's : Needs assistance/impaired   Eating/Feeding Details (indicate cue type and reason): NG tube in place pt states "Pull it out! I can chew"                                   General ADL Comments: pt low in recliner on arrival and fatigued. Pt asking to get up. pt assisted back to bed surface. pt states "i am just so tired of all this" pt answers "you know" when asked questions that are orientation related. pt moaning but denies pain. Pt reports SOB with O2 96% at this time     Vision       Perception     Praxis      Cognition Arousal/Alertness: Awake/alert Behavior During Therapy: Flat affect Overall Cognitive Status: Impaired/Different from baseline Area of Impairment: Memory;Awareness;Following commands;Safety/judgement;Orientation                 Orientation Level: Disoriented to;Time;Situation;Place Current Attention Level: Sustained Memory: Decreased recall of precautions;Decreased short-term memory Following Commands: Follows one step commands inconsistently;Follows one step commands with increased time Safety/Judgement: Decreased awareness of safety;Decreased awareness of deficits Awareness: Intellectual Problem Solving: Slow processing;Decreased initiation;Difficulty sequencing;Requires verbal cues;Requires tactile cues General Comments: History of dementia. Able to recall great  grandchildren's names with increased time        Exercises     Shoulder Instructions       General Comments VSS RA    Pertinent Vitals/ Pain       Pain Assessment: No/denies pain Pain Intervention(s): Monitored during session;Repositioned  Home Living                                           Prior Functioning/Environment              Frequency  Min 2X/week        Progress Toward Goals  OT Goals(current goals can now be found in the care plan section)  Progress towards OT goals: Progressing toward goals  Acute Rehab OT Goals Patient Stated Goal: to take NG tube out and chew her food OT Goal Formulation: Patient unable to participate in goal setting Potential to Achieve Goals: Fair ADL Goals Pt Will Perform Grooming: with mod assist;bed level Pt Will Transfer to Toilet: with mod assist;stand pivot transfer;bedside commode Additional ADL Goal #1: pt will follow 1 step command 50% of session Additional ADL Goal #2: Pt will complete bed mobility mod (A) as precursor to adls.  Plan Discharge plan remains appropriate    Co-evaluation                 AM-PAC OT "6 Clicks" Daily Activity     Outcome Measure   Help from another person eating meals?: A Lot Help from another person taking care of personal grooming?: A Lot Help from another person toileting, which includes using toliet, bedpan, or urinal?: Total Help from another person bathing (including washing, rinsing, drying)?: Total Help from another person to put on and taking off regular upper body clothing?: Total Help from another person to put on and taking off regular lower body clothing?: Total 6 Click Score: 8    End of Session Equipment Utilized During Treatment: Gait belt;Rolling walker  OT Visit Diagnosis: Unsteadiness on feet (R26.81);Muscle weakness (generalized) (M62.81)   Activity Tolerance Patient tolerated treatment well   Patient Left in bed;with call bell/phone within reach;with bed alarm set;with restraints reapplied (wrist restraints applied)   Nurse Communication Mobility status;Precautions        Time: 2482-5003 OT Time Calculation (min): 13 min  Charges: OT General Charges $OT Visit: 1 Visit OT Treatments $Therapeutic Activity: 8-22 mins   Brynn, OTR/L   Acute Rehabilitation Services Pager: [phone] Office: [phone] .    Mateo Flow 05/26/2020, 4:45 PM

## 2020-05-26 NOTE — Discharge Summary (Signed)
Physician Discharge Summary  Alexandra Mays ZOX:[id number] DOB: Jun 30, 1950 DOA: 05/13/2020  PCP: Tanna Furry, PA-C  Admit Mays: 05/13/2020 Discharge Mays: 05/16/2020  Time spent: 35  Recommendations for Outpatient Follow-up:   1. Cardiology Dr. Tomie China in 1 to 2 weeks, please check BMP at follow-up 2. Smoking cessation counseling   Discharge Diagnoses:  Takotsubo's cardiomyopathy   NSTEMI (non-ST elevated myocardial infarction) [facility]) Active Problems:   Abdominal aortic aneurysm (AAA) without rupture (HCC)   Stress-induced cardiomyopathy   Acute respiratory failure with hypoxia (HCC)   Essential hypertension   Leukocytosis   Hyperlipemia   Tobacco abuse   Discharge Condition: Stable  Diet recommendation: Low-sodium, heart healthy  Filed Weights   05/14/20 0918 05/15/20 0438  Weight: 37.2 kg 37.8 kg    History of present illness:   70 year old female with history of CAD, remote PCI in 2007, COPD, ongoing tobacco abuse, descending aortic aneurysm/AAA status post repair was admitted on 3/3 with chest pain, dyspnea, found to have non-STEMI as well as COPD with mucous plugging   Hospital Course:   Non-ST elevation MI Acute systolic CHF -Cath noted nonobstructive coronary disease, suspected to be Takotsubo cardiomyopathy -Followed by cardiology in consultation, volume status is improved and clinically remains euvolemic -Continue aspirin, beta-blocker, losartan -Soft blood pressures in the 90s to low 100s limit further titration or additional meds at this time, this can be considered at the time of follow-up -Discharged home without diuretics, consider low-dose Aldactone as outpatient as blood pressure tolerates  Acute hypoxic respiratory failure Secondary to COPD exacerbation, mucous plugging -Improving, no wheezing at this time -Continue prednisone and azithromycin -Weaned off oxygen  Essential hypertension -Stable  Normocytic anemia -Stable,  monitor  WJX:BJYNWGN with 3.6 cm abdominal aortic aneurysm with prior treated abdominal aortic aneurysm. -Continue yearly surveillance  Tobacco abuse: Patient reports approximately a 55 smoking pack year history. -Declines nicotine patch -Counseled    Discharge Exam: Vitals:   05/16/20 0815 05/16/20 0918  BP: 106/66   Pulse:  73  Resp:  16  Temp:    SpO2:  92%    General: AAOx3 Cardiovascular: S1-S2, regular rate rhythm Respiratory: Clear  Discharge Instructions   Discharge Instructions    Diet - low sodium heart healthy   Complete by: As directed    Increase activity slowly   Complete by: As directed      Allergies as of 05/16/2020      Reactions   No Known Allergies       Medication List    TAKE these medications   albuterol 108 (90 Base) MCG/ACT inhaler Commonly known as: VENTOLIN HFA Inhale 2 puffs into the lungs every 6 (six) hours as needed for wheezing or shortness of breath.   albuterol (2.5 MG/3ML) 0.083% nebulizer solution Commonly known as: PROVENTIL Take 3 mLs (2.5 mg total) by nebulization every 6 (six) hours as needed for wheezing or shortness of breath.   Breo Ellipta 100-25 MCG/INH Aepb Generic drug: fluticasone furoate-vilanterol Inhale 1 puff into the lungs daily.   carvedilol 3.125 MG tablet Commonly known as: COREG Take 1 tablet (3.125 mg total) by mouth 2 (two) times daily with a meal.   ibuprofen 200 MG tablet Commonly known as: ADVIL Take 800 mg by mouth every 6 (six) hours as needed for mild pain.   losartan 25 MG tablet Commonly known as: COZAAR Take 0.5 tablets (12.5 mg total) by mouth daily.   polyethylene glycol 17 g packet Commonly known as: MIRALAX / GLYCOLAX Take  17 g by mouth daily.   simvastatin 20 MG tablet Commonly known as: ZOCOR Take 20 mg by mouth every morning.   Tiotropium Bromide Monohydrate 2.5 MCG/ACT Aers Inhale 2 puffs into the lungs daily.     ASK your doctor about these medications    predniSONE 20 MG tablet Commonly known as: DELTASONE Take 1 tablet (20 mg total) by mouth daily with breakfast for 3 days. Ask about: Should I take this medication?      Allergies  Allergen Reactions  . No Known Allergies     Follow-up Information    Tanna FurryHout, Brittany, [place]. Schedule an appointment as soon as possible for a visit in 1 week(s).   Specialty: Physician Assistant Contact information: [address] [place] [address] [phone]        Revankar, Aundra Dubinajan R, MD. Schedule an appointment as soon as possible for a visit in 2 week(s).   Specialty: Cardiology Contact information: [address] [place] [phone]                The results of significant diagnostics from this hospitalization (including imaging, microbiology, ancillary and laboratory) are listed below for reference.    Significant Diagnostic Studies:  CARDIAC CATHETERIZATION  Result Mays: 05/14/2020  The left ventricular ejection fraction is 25-35% by visual estimate.  1.  Patent RCA stent with mild in-stent restenosis 2.  Patent left main 3.  Patent LAD with mild to moderate nonobstructive stenosis in the mid vessel and a segment of intramyocardial bridging 4.  Patent left circumflex with minimal nonobstructive disease 5.  Severe LV dysfunction with contractile pattern consistent with acute Takotsubo syndrome/periapical ballooning, vigorous basal contraction, LVEF less than 35%  family. Electronically Signed   By: Lisbeth RenshawNeelesh  Nundkumar   On: 05/20/2020 15:56   DG Chest Port 1 View  Result Mays: 05/13/2020 CLINICAL DATA:  Dyspnea EXAM: PORTABLE CHEST 1 VIEW COMPARISON:  07/25/2016, CT 12/14/2016 FINDINGS: The lungs are symmetrically hyperinflated in keeping with changes of underlying COPD. Right apical nodule is grossly unchanged from prior CT examination,. No new focal pulmonary nodules or infiltrates. Nipple shadows overlie the lung bases bilaterally. No pneumothorax or pleural  effusion. Cardiac size within normal limits. Pulmonary vascularity is normal. No acute bone abnormality. IMPRESSION: No active disease. COPD. Grossly stable right apical pulmonary nodule. Electronically Signed   By: Helyn NumbersAshesh  Parikh MD   On: 05/13/2020 02:21   DG Abd Portable 1V  ECHOCARDIOGRAM COMPLETE  Result Mays: 05/13/2020    ECHOCARDIOGRAM REPORT   Patient Name:   Alexandra RileRICIA A Depaulo Mays of Exam: 05/13/2020 Medical Rec #:  [id number]         Height:       63.0 in Accession #:    [id number]        Weight:       80.0 lb Mays of Birth:  06/11/1950          BSA:          1.310 m Patient Age:    69 years          BP:           109/66 mmHg Patient Gender: F                 HR:           87 bpm. Exam Location:  Inpatient Procedure: 2D Echo, Cardiac Doppler, Color Doppler and Intracardiac  Opacification Agent Indications:    R07.9* Chest pain, unspecified  History:        Patient has no prior history of Echocardiogram examinations.                 Signs/Symptoms:Chest Pain, Shortness of Breath and Dyspnea.                 Aortic aneurysm repair.  Sonographer:    Sheralyn Boatman RDCS Referring Phys: [id number] [facility] NICOLE DUKE  Sonographer Comments: Technically difficult study due to poor echo windows. Extremely thin habitus, difficult angles. Interrupted multiple times. IMPRESSIONS  1. Left ventricular ejection fraction, by estimation, is 20 to 25%. The left ventricle has severely decreased function.  2. There is severe hypokinesis of all mid-to-apical LV segments. The basal segments have hyperdynamic contractility. 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  3. Left ventricular diastolic parameters are consistent with Grade I diastolic dysfunction (impaired relaxation).  4. Right ventricular systolic function is normal. The right ventricular size is normal.  5. The mitral valve is degenerative. Trivial mitral valve regurgitation.  6. The aortic valve was not well visualized.  There is mild calcification of the aortic valve. There is mild thickening of the aortic valve. Aortic valve regurgitation is trivial.  7. The inferior vena cava is dilated in size with <50% respiratory variability, suggesting right atrial pressure of 15 mmHg.  8. Slow flow noted in the IVC. FINDINGS  Left Ventricle: Left ventricular ejection fraction, by estimation, is 20 to 25%. The left ventricle has severely decreased function. The left ventricle demonstrates regional wall motion abnormalities. Definity contrast agent was given IV to delineate the left ventricular endocardial borders. There is severe hypokinesis of all mid-to-apical LV segments. The basal segments have hyperdynamic contractility. 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 The left ventricular internal cavity size was normal in size. There is no left ventricular hypertrophy. Left ventricular diastolic parameters are consistent with Grade I diastolic dysfunction (impaired relaxation). Right Ventricle: The right ventricular size is normal. Right vetricular wall thickness was not well visualized. Right ventricular systolic function is normal. Left Atrium: Left atrial size was normal in size. Right Atrium: Right atrial size was normal in size. Pericardium: There is no evidence of pericardial effusion. Mitral Valve: The mitral valve is degenerative in appearance. Trivial mitral valve regurgitation. Tricuspid Valve: The tricuspid valve is normal in structure. Tricuspid valve regurgitation is mild. Aortic Valve: The aortic valve was not well visualized. There is mild calcification of the aortic valve. There is mild thickening of the aortic valve. Aortic valve regurgitation is trivial. Pulmonic Valve: The pulmonic valve was normal in structure. Pulmonic valve regurgitation is trivial. Aorta: Aortic root could not be assessed and the aortic root is normal in size and structure. Venous: The inferior vena cava is dilated in size  with less than 50% respiratory variability, suggesting right atrial pressure of 15 mmHg. IAS/Shunts: No atrial level shunt detected by color flow Doppler.  LEFT VENTRICLE PLAX 2D LVIDd:         3.80 cm     Diastology LVIDs:         3.60 cm     LV e' medial:    4.46 cm/s LV PW:         1.00 cm     LV E/e' medial:  13.4 LV IVS:        1.00 cm     LV e' lateral:   4.57 cm/s LVOT  diam:     1.80 cm     LV E/e' lateral: 13.1 LV SV:         25 LV SV Index:   19 LVOT Area:     2.54 cm  LV Volumes (MOD) LV vol d, MOD A4C: 65.2 ml LV vol s, MOD A4C: 57.1 ml LV SV MOD A4C:     65.2 ml RIGHT VENTRICLE             IVC RV S prime:     11.10 cm/s  IVC diam: 1.70 cm TAPSE (M-mode): 1.6 cm LEFT ATRIUM             Index      RIGHT ATRIUM          Index LA diam:        2.10 cm 1.60 cm/m RA Area:     6.93 cm LA Vol (A2C):   10.8 ml 8.24 ml/m RA Volume:   12.20 ml 9.31 ml/m LA Vol (A4C):   11.0 ml 8.40 ml/m LA Biplane Vol: 11.7 ml 8.93 ml/m  AORTIC VALVE LVOT Vmax:   65.00 cm/s LVOT Vmean:  40.800 cm/s LVOT VTI:    0.100 m  AORTA Ao Root diam: 2.80 cm MITRAL VALVE MV Area (PHT): 5.38 cm    SHUNTS MV Decel Time: 141 msec    Systemic VTI:  0.10 m MV E velocity: 59.70 cm/s  Systemic Diam: 1.80 cm MV A velocity: 77.10 cm/s MV E/A ratio:  0.77 Laurance Flatten MD Electronically signed by Laurance Flatten MD Signature Mays/Time: 05/13/2020/2:48:10 PM    Final    CT ANGIO CHEST/ABD/PEL FOR DISSECTION W &/OR WO CONTRAST  Result Mays: 05/13/2020 CLINICAL DATA:  Abdominal pain with concern for aortic dissection EXAM: CT ANGIOGRAPHY CHEST, ABDOMEN AND PELVIS TECHNIQUE: Non-contrast CT of the chest was initially obtained. Multidetector CT imaging through the chest, abdomen and pelvis was performed using the standard protocol during bolus administration of intravenous contrast. Multiplanar reconstructed images and MIPs were obtained and reviewed to evaluate the vascular anatomy. CONTRAST:  OMNIPAQUE IOHEXOL 350 MG/ML SOLN  COMPARISON:  06/23/2016. 12/14/2016 chest CT and 08/15/2016 abdominal CT FINDINGS: CTA CHEST FINDINGS Cardiovascular: No acute intramural hematoma on the noncontrast phase. Normal heart size. No pericardial effusion. Severe atheromatous disease of the aorta, great vessels, and coronaries. 36 mm diameter descending thoracic aorta just above the hiatus. This includes a ventral predominant bulge which shows progressive mural thrombus since prior. Additional notable rightward bulging just below the isthmus with total diameter of 34 mm at this level, non progressed from 2018. No pulmonary artery filling defect. Mediastinum/Nodes: No hematoma or air leak Lungs/Pleura: Airway plugging in the right lower lobe with mild atelectasis and airspace opacity. Centrilobular emphysema with hyperinflation. Chronic calcified nodule in the right upper lobe attributed to old granulomatous disease. Musculoskeletal: No acute finding Review of the MIP images confirms the above findings. CTA ABDOMEN AND PELVIS FINDINGS VASCULAR Aorta: Severe atherosclerosis. In 2018 there was aortic aneurysm repair with left iliac and right femoral grafting. Mural high-density at the upper margin of the graft is attributed to suture material. No evidence of leaking or adjacent aneurysmal dilatation. Diffuse attenuation of the iliac arteries from atherosclerosis. Bilateral inflow is patent. 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 No acute occlusion or aneurysm. SMA: Less than 50% atheromatous narrowing at the proximal SMA. No branch occlusion, beading, or aneurysm. Renals: Accessory right lower pole renal artery. Heavily calcified left renal artery ostium  with 50% stenosis by coronal reformats. No acute finding. IMA: Likely sacrificed. Inflow: As above Veins: Negative in the arterial phase Review of the MIP images confirms the above findings. NON-VASCULAR Hepatobiliary: Vague high-density area in the central liver which is likely  perfusional.No evidence of biliary obstruction or stone. Pancreas: Unremarkable. Spleen: Unremarkable. Adrenals/Urinary Tract: Negative adrenals. No hydronephrosis or stone. Unremarkable bladder. Stomach/Bowel:  No obstruction. No appendicitis. Vascular/Lymphatic: No acute vascular abnormality. No mass or adenopathy. Reproductive:No pathologic findings. Other: No ascites or pneumoperitoneum. Musculoskeletal: No acute abnormalities. Advanced lumbar spine degeneration with levoscoliosis and reversal of lumbar lordosis. Review of the MIP images confirms the above findings. IMPRESSION: 1. No acute vascular finding. 2. Descending thoracic aortic aneurysms measuring up to 3.6 cm in diameter. 3. Treated abdominal aortic aneurysm. No interval or adverse finding. 4. Aspiration or mucous plugging in the right lower lobe mild atelectasis. 5. Aortic Atherosclerosis (ICD10-I70.0) and Emphysema (ICD10-J43.9), both advanced. 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 Electronically Signed   By: Marnee Spring M.D.   On: 05/13/2020 08:50   Microbiology: Recent Results (from the past 240 hour(s))  Resp Panel by RT-PCR (Flu A&B, Covid) Nasopharyngeal Swab     Status: None   Collection Time: 05/20/20 10:26 AM   Specimen: Nasopharyngeal Swab; Nasopharyngeal(NP) swabs in vial transport medium  Result Value Ref Range Status   SARS Coronavirus 2 by RT PCR NEGATIVE NEGATIVE Final    Comment: (NOTE) SARS-CoV-2 target nucleic acids are NOT DETECTED.  The SARS-CoV-2 RNA is generally detectable in upper respiratory specimens during the acute phase of infection. The lowest concentration of SARS-CoV-2 viral copies this assay can detect is 138 copies/mL. A negative result does not preclude SARS-Cov-2 infection and should not be used as the sole basis for treatment or other patient management decisions. A negative result may occur with  improper specimen collection/handling, submission of  specimen other than nasopharyngeal swab, presence of viral mutation(s) within the areas targeted by this assay, and inadequate number of viral copies(<138 copies/mL). A negative result must be combined with clinical observations, patient history, and epidemiological information. The expected result is Negative.  Fact Sheet for Patients:  BloggerCourse.com  Fact Sheet for Healthcare Providers:  SeriousBroker.it  This test is no t yet approved or cleared by the [place] FDA and  has been authorized for detection and/or diagnosis of SARS-CoV-2 by FDA under an Emergency Use Authorization (EUA). This EUA will remain  in effect (meaning this test can be used) for the duration of the COVID-19 declaration under Section 564(b)(1) of the Act, 21 U.S.C.section 360bbb-3(b)(1), unless the authorization is terminated  or revoked sooner.       Influenza A by PCR NEGATIVE NEGATIVE Final   Influenza B by PCR NEGATIVE NEGATIVE Final    Comment: (NOTE) The Xpert Xpress SARS-CoV-2/FLU/RSV plus assay is intended as an aid in the diagnosis of influenza from Nasopharyngeal swab specimens and should not be used as a sole basis for treatment. Nasal washings and aspirates are unacceptable for Xpert Xpress SARS-CoV-2/FLU/RSV testing.  Fact Sheet for Patients: BloggerCourse.com  Fact Sheet for Healthcare Providers: SeriousBroker.it  This test is not yet approved or cleared by the [place] FDA and has been authorized for detection and/or diagnosis of SARS-CoV-2 by FDA under an Emergency Use Authorization (EUA). This EUA will remain in effect (meaning this test can be used) for the duration of the COVID-19 declaration under Section 564(b)(1) of the Act, 21 U.S.C. section 360bbb-3(b)(1),  unless the authorization is terminated or revoked.  Performed at [facility], [address],  [place], [place] [address]   MRSA PCR Screening     Status: None   Collection Time: 05/20/20  5:14 PM   Specimen: Nasal Mucosa; Nasopharyngeal  Result Value Ref Range Status   MRSA by PCR NEGATIVE NEGATIVE Final    Comment:        The GeneXpert MRSA Assay (FDA approved for NASAL specimens only), is one component of a comprehensive MRSA colonization surveillance program. It is not intended to diagnose MRSA infection nor to guide or monitor treatment for MRSA infections. Performed at [facility], [address], [place], [place] [address]      Labs: Basic Metabolic Panel: Recent Labs  Lab 05/20/20 1044 05/20/20 1055 05/23/20 0428 05/23/20 0428 05/24/20 1256 05/24/20 1809 05/25/20 0720 05/25/20 1644 05/26/20 0834  NA 132* 135 127*  --   --  131*  --   --  135  K 3.7 3.7 2.9*  --   --  3.7  --   --  3.0*  CL 102 100 96*  --   --  106  --   --  106  CO2 25  --  21*  --   --  19*  --   --  21*  GLUCOSE 93 85 90  --   --  98  --   --  142*  BUN 17 21 14   --   --  19  --   --  12  CREATININE 0.83 0.70 0.55  --   --  0.63  --   --  0.59  CALCIUM 8.4*  --  8.5*  --   --  8.0*  --   --  8.3*  MG  --   --  1.8   < > 2.0 1.9 2.0 1.8 1.7  PHOS  --   --  2.2*   < > 2.7 2.4* 2.1* 2.9 2.6   < > = values in this interval not displayed.   Liver Function Tests: Recent Labs  Lab 05/20/20 1044  AST 26  ALT 17  ALKPHOS 57  BILITOT 0.5  PROT 5.5*  ALBUMIN 3.0*   No results for input(s): LIPASE, AMYLASE in the last 168 hours. No results for input(s): AMMONIA in the last 168 hours. CBC: Recent Labs  Lab 05/20/20 1044 05/20/20 1055 05/23/20 0428  WBC 9.4  --  12.8*  NEUTROABS 7.4  --   --   HGB 12.0 11.9* 12.9  HCT 35.8* 35.0* 36.6  MCV 95.0  --  88.0  PLT 151  --  205   Cardiac Enzymes: No results for input(s): CKTOTAL, CKMB, CKMBINDEX, TROPONINI in the last 168 hours. BNP: BNP (last 3 results) Recent Labs    05/13/20 0158  BNP 81.4    ProBNP (last 3  results) No results for input(s): PROBNP in the last 8760 hours.  CBG: Recent Labs  Lab 05/25/20 2320 05/26/20 0325 05/26/20 0735 05/26/20 1137 05/26/20 1521  GLUCAP 147* 127* 119* 154* 102*       Signed:  05/28/20 MD.  [facility] 05/26/2020, 3:44 PM

## 2020-05-26 NOTE — Progress Notes (Signed)
Neurosurgery Service Progress Note  Subjective: No acute events overnight, no complaints today   Objective: Vitals:   05/26/20 0905 05/26/20 1000 05/26/20 1100 05/26/20 1200  BP:  (!) 124/59 (!) 123/54 117/62  Pulse:  71 68 69  Resp:  19 19 (!) 21  Temp:    98.4 F (36.9 C)  TempSrc:    Axillary  SpO2: 98% 100% 100% 96%  Weight:      Height:        Physical Exam: Mildly somnolent but Ox2, PERRL, EOMI, FS, TM, Strength 5/5 x4, SILTx4  Assessment & Plan: 70 y.o. woman w/ aSAH PBD7 s/p R PComm coiling, recovering well.  -continue nimodipine, levetiracetam -TCDs continue to have reassuring velocities -okay with restarting CHF Rx per CCM recs -may be able to transfer to stepdown later this week  Jadene Pierini  05/26/20 1:33 PM

## 2020-05-26 NOTE — Progress Notes (Signed)
eLink Physician-Brief Progress Note Patient Name: Alexandra Mays DOB: 1950-10-10 MRN: [id number]   Date of Service  05/26/2020  HPI/Events of Note  Patient needs restraints order renewed for patient safety.  eICU Interventions  Restraints order renewed.        Sathvik Tiedt U Larz Mark 05/26/2020, 1:21 AM

## 2020-05-26 NOTE — Progress Notes (Signed)
Inpatient Rehab Admissions Coordinator:   Pt. Is not medically ready for CIR at this time, continues to require monitoring for vasospasms. Will follow for potential admit pending CIR admission.   Megan Salon, MS, CCC-SLP Rehab Admissions Coordinator  [phone] (celll) [phone] (office)

## 2020-05-26 NOTE — Progress Notes (Signed)
Following bath, patient oxygen saturation dipped to 69%. Placed on [place] at 4L/min with no results, used NRB at 15L/min and moved O2 sensor from R hand to L ear. Patient O2 saturation returned to 97% with good pleth. Returned patient to [place] at 4L/min. Will continue to monitor.

## 2020-05-26 NOTE — Progress Notes (Signed)
Physical Therapy Treatment Patient Details Name: Alexandra Mays MRN: [id number] DOB: [birth year] Today's Date: 05/26/2020    History of Present Illness 70 y.o. female who presented 05/20/20 with R neck pain and a headache. 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 Pt recently discharged from hospital 05/16/20 with NSTEMI. 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 PMH: dyspnea, COPD, AAA, pure hyperglyceridemia.    PT Comments    Pt progressing towards her physical therapy goals; increased verbalizations today. When referring to getting out of bed, pt stating, "this is dumb," but was agreeable. Ambulating x 10 feet with a walker at a mod assist level; close chair follow utilized. From chair, practiced sit to stands with face to face transfer technique, where she was able to progress to a min assist level with good initiation. Continue to recommend comprehensive inpatient rehab (CIR) for post-acute therapy needs.    Follow Up Recommendations  CIR;Supervision/Assistance - 24 hour     Equipment Recommendations  3in1 (PT);Wheelchair (measurements PT);Wheelchair cushion (measurements PT)    Recommendations for Other Services       Precautions / Restrictions Precautions Precautions: Fall;Other (comment) Precaution Comments: NGT, wrist restraints Restrictions Weight Bearing Restrictions: No    Mobility  Bed Mobility Overal bed mobility: Needs Assistance Bed Mobility: Supine to Sit Rolling: Max assist     Sit to supine: Max assist   General bed mobility comments: Decreased initiation, maxA to progress BLE's to edge of bed and trunk to upright    Transfers Overall transfer level: Needs assistance Equipment used: Rolling walker (2 wheeled) Transfers: Sit to/from Stand Sit to Stand: Min assist;Mod assist Stand pivot transfers: Max assist        General transfer comment: ModA from edge of bed, minA from recliner with face to face transfer  Ambulation/Gait Ambulation/Gait assistance: Mod assist;+2 safety/equipment Gait Distance (Feet): 10 Feet Assistive device: Rolling walker (2 wheeled) Gait Pattern/deviations: Step-through pattern;Decreased stride length;Trunk flexed;Narrow base of support Gait velocity: decreased Gait velocity interpretation: <1.31 ft/sec, indicative of household ambulator General Gait Details: Cues for stepping initiation and encouragement to execute, increased trunk flexion, decreased bilateral foot clearance. Close chair follow utilized. ModA for balance   Stairs             Wheelchair Mobility    Modified Rankin (Stroke Patients Only) Modified Rankin (Stroke Patients Only) Pre-Morbid Rankin Score: No significant disability Modified Rankin: Moderately severe disability     Balance Overall balance assessment: Needs assistance Sitting-balance support: Bilateral upper extremity supported;Feet supported Sitting balance-Leahy Scale: Fair     Standing balance support: Bilateral upper extremity supported;During functional activity Standing balance-Leahy Scale: Poor Standing balance comment: reliant on RW and cues from therapist                            Cognition Arousal/Alertness: Awake/alert Behavior During Therapy: Flat affect Overall Cognitive Status: Impaired/Different from baseline Area of Impairment: Memory;Awareness;Following commands;Safety/judgement;Orientation                 Orientation Level: Disoriented to;Time;Situation;Place Current Attention Level: Sustained Memory: Decreased recall of precautions;Decreased short-term memory Following Commands: Follows one step commands inconsistently;Follows one step commands with increased time Safety/Judgement: Decreased awareness of safety;Decreased awareness of deficits Awareness: Intellectual Problem Solving: Slow  processing;Decreased initiation;Difficulty sequencing;Requires verbal cues;Requires tactile cues General Comments: History of dementia. Able to recall great grandchildren's names  with increased time      Exercises Other Exercises Other Exercises: x3 Sit to Stands    General Comments General comments (skin integrity, edema, etc.): VSS RA      Pertinent Vitals/Pain Pain Assessment: Faces Faces Pain Scale: Hurts a little bit Pain Location: generalized Pain Descriptors / Indicators: Moaning Pain Intervention(s): Limited activity within patient's tolerance;Monitored during session    Home Living                      Prior Function            PT Goals (current goals can now be found in the care plan section) Acute Rehab PT Goals Patient Stated Goal: to take NG tube out and chew her food Potential to Achieve Goals: Good Progress towards PT goals: Progressing toward goals    Frequency    Min 4X/week      PT Plan Current plan remains appropriate    Co-evaluation              AM-PAC PT "6 Clicks" Mobility   Outcome Measure  Help needed turning from your back to your side while in a flat bed without using bedrails?: A Lot Help needed moving from lying on your back to sitting on the side of a flat bed without using bedrails?: Total Help needed moving to and from a bed to a chair (including a wheelchair)?: A Lot Help needed standing up from a chair using your arms (e.g., wheelchair or bedside chair)?: A Lot Help needed to walk in hospital room?: A Lot Help needed climbing 3-5 steps with a railing? : Total 6 Click Score: 10    End of Session Equipment Utilized During Treatment: Gait belt Activity Tolerance: Patient tolerated treatment well Patient left: in chair;with call bell/phone within reach;with chair alarm set Nurse Communication: Mobility status PT Visit Diagnosis: Unsteadiness on feet (R26.81);Other abnormalities of gait and mobility (R26.89);Muscle  weakness (generalized) (M62.81);Difficulty in walking, not elsewhere classified (R26.2);Other symptoms and signs involving the nervous system (R29.898)     Time: 1417-1440 PT Time Calculation (min) (ACUTE ONLY): 23 min  Charges:  $Gait Training: 8-22 mins $Therapeutic Activity: 8-22 mins                     Lillia Pauls, PT, DPT Acute Rehabilitation Services Pager [phone] Office [phone]    Alexandra Mays 05/26/2020, 5:09 PM

## 2020-05-27 ENCOUNTER — Inpatient Hospital Stay (HOSPITAL_COMMUNITY): Payer: Medicare Other

## 2020-05-27 DIAGNOSIS — I255 Ischemic cardiomyopathy: Secondary | ICD-10-CM | POA: Diagnosis not present

## 2020-05-27 LAB — BASIC METABOLIC PANEL
Anion gap: 8 (ref 5–15)
BUN: 12 mg/dL (ref 8–23)
CO2: 19 mmol/L — ABNORMAL LOW (ref 22–32)
Calcium: 8.2 mg/dL — ABNORMAL LOW (ref 8.9–10.3)
Chloride: 109 mmol/L (ref 98–111)
Creatinine, Ser: 0.46 mg/dL (ref 0.44–1.00)
GFR, Estimated: >60 mL/min (ref >60)
Glucose, Bld: 110 mg/dL — ABNORMAL HIGH (ref 70–99)
Potassium: 3.8 mmol/L (ref 3.5–5.1)
Sodium: 136 mmol/L (ref 135–145)

## 2020-05-27 LAB — GLUCOSE, CAPILLARY
Glucose-Capillary: 118 mg/dL — ABNORMAL HIGH (ref 70–99)
Glucose-Capillary: 122 mg/dL — ABNORMAL HIGH (ref 70–99)
Glucose-Capillary: 125 mg/dL — ABNORMAL HIGH (ref 70–99)
Glucose-Capillary: 126 mg/dL — ABNORMAL HIGH (ref 70–99)
Glucose-Capillary: 141 mg/dL — ABNORMAL HIGH (ref 70–99)
Glucose-Capillary: 142 mg/dL — ABNORMAL HIGH (ref 70–99)

## 2020-05-27 LAB — MAGNESIUM: Magnesium: 2.1 mg/dL (ref 1.7–2.4)

## 2020-05-27 LAB — PHOSPHORUS: Phosphorus: 2.7 mg/dL (ref 2.5–4.6)

## 2020-05-27 IMAGING — DX DG ABDOMEN 1V
1 series · 1 of 1 positions shown · non-contrast
Comparison: [DATE]

CLINICAL DATA: Early satiety, abdominal pain

EXAM:
ABDOMEN - 1 VIEW

[abdomen]
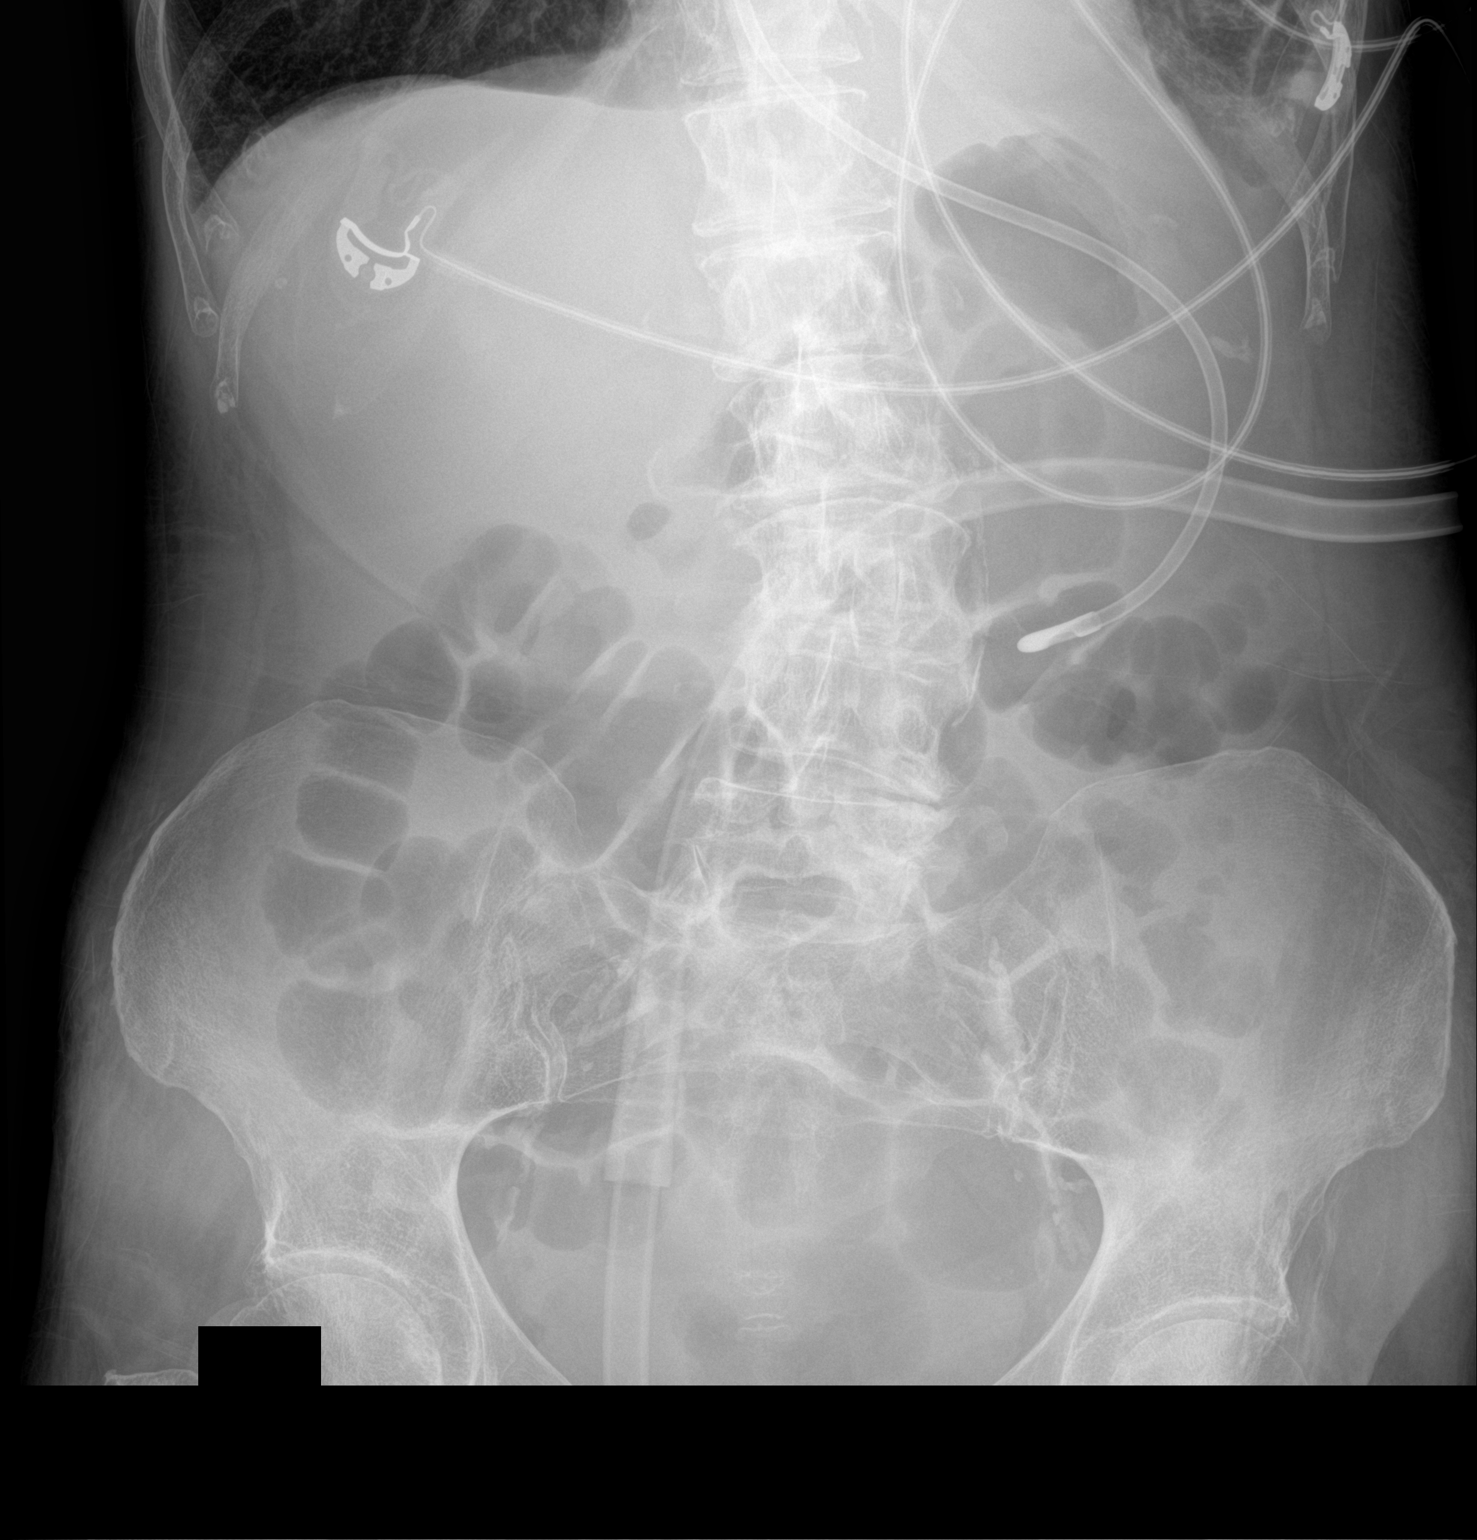

[1 of 1 positions shown; findings below may reference images not displayed]

FINDINGS: Enteric tube tip overlies the mid gastric region. Mild air-filled
colon without small bowel distension or obstructive pattern.
Scoliosis and degenerative changes of the spine.
IMPRESSION: Nonobstructive bowel gas pattern. Enteric tube tip overlies the mid
gastric region

## 2020-05-27 MED ORDER — QUETIAPINE FUMARATE 25 MG PO TABS
12.5000 mg | ORAL_TABLET | Freq: Every day | ORAL | Status: DC
  Administered 2020-05-27: 12.5 mg
  Filled 2020-05-27: qty 1

## 2020-05-27 MED ORDER — CARVEDILOL 3.125 MG PO TABS
3.1250 mg | ORAL_TABLET | Freq: Two times a day (BID) | ORAL | Status: DC
  Administered 2020-05-27: 3.125 mg
  Filled 2020-05-27: qty 1

## 2020-05-27 MED ORDER — POTASSIUM & SODIUM PHOSPHATES 280-160-250 MG PO PACK
2.0000 | PACK | Freq: Once | ORAL | Status: AC
  Administered 2020-05-27: 2
  Filled 2020-05-27: qty 1

## 2020-05-27 NOTE — Progress Notes (Signed)
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  -continue nimodipine, levetiracetam -TCDs continue to have reassuring velocities -okay with restarting CHF Rx per CCM recs -may be able to transfer to stepdown tomorrow if she tolerates changes today  Jadene Pierini  05/27/20 9:19 AM

## 2020-05-27 NOTE — Progress Notes (Addendum)
Physical Therapy Treatment Patient Details Name: Alexandra Mays MRN: [id number] DOB: [birth year] Today's Date: 05/27/2020    History of Present Illness 70 y.o. female who presented 05/20/20 with R neck pain and a headache. 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 Pt recently discharged from hospital 05/16/20 with NSTEMI. 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 PMH: dyspnea, COPD, AAA, pure hyperglyceridemia.    PT Comments    Pt limited in mobility and at high risk for falls due to pain and impaired cognition, coordination, muscular strength, and aerobic endurance. Pt making progress though through being more verbal, but continues to keep her eyes closed often, and advancing her gait distance to ~20 ft this date. She continues to require modAx2 to steady pt and facilitate bil weight shifting and leg advancement due to her poor motor sequencing with gait though. Will continue to follow acutely. Current recommendations remain appropriate.   Follow Up Recommendations  CIR;Supervision/Assistance - 24 hour     Equipment Recommendations  3in1 (PT);Wheelchair (measurements PT);Wheelchair cushion (measurements PT)    Recommendations for Other Services       Precautions / Restrictions Precautions Precautions: Fall;Other (comment) Precaution Comments: NGT, wrist restraints, rectal tube Restrictions Weight Bearing Restrictions: No    Mobility  Bed Mobility Overal bed mobility: Needs Assistance Bed Mobility: Supine to Sit     Supine to sit: Mod assist     General bed mobility comments: Decreased initiation, minA to initiate legs off EOB then modA at trunk to ascend to sit.    Transfers Overall transfer level: Needs assistance Equipment used: Rolling walker (2 wheeled) Transfers: Sit to/from [employer] Sit to Stand: Min assist;Mod  assist Stand pivot transfers: Mod assist;+2 physical assistance;+2 safety/equipment       General transfer comment: ModA from edge of bed, minA from recliner or commode with face to face transfer. Repeated cues with hand-over-hand directing for proper safe hand placements. ModAx2 to steady and facilitate legs for stand step bed > commode.  Ambulation/Gait Ambulation/Gait assistance: Mod assist;+2 safety/equipment;+2 physical assistance Gait Distance (Feet): 20 Feet (x3 bouts of ~2 ft > ~4 ft > ~20 ft) Assistive device: Rolling walker (2 wheeled);2 person hand held assist Gait Pattern/deviations: Step-through pattern;Decreased stride length;Trunk flexed;Narrow base of support;Shuffle;Decreased weight shift to right;Decreased weight shift to left;Decreased step length - left;Decreased step length - right Gait velocity: decreased Gait velocity interpretation: <1.31 ft/sec, indicative of household ambulator General Gait Details: Pt with poor motor sequencing, needing tactile and verbal cues with modA to facilitate bil weight shifting and contralateral leg advancement while modA provided from other personnel for steadying pt. Attempted to ambulate with bil UEs wrapped around shoulders of therapists on either side to decrease pt's tendency to lean anteriorly resting forearms on RW, min success.   Stairs             Wheelchair Mobility    Modified Rankin (Stroke Patients Only) Modified Rankin (Stroke Patients Only) Pre-Morbid Rankin Score: No significant disability Modified Rankin: Moderately severe disability     Balance Overall balance assessment: Needs assistance Sitting-balance support: Bilateral upper extremity supported;Feet supported Sitting balance-Leahy Scale: Fair     Standing balance support: Bilateral upper extremity supported;During functional activity Standing balance-Leahy Scale: Poor Standing balance comment: reliant on RW and cues from therapist  Cognition Arousal/Alertness: Awake/alert Behavior During Therapy: Flat affect;Anxious Overall Cognitive Status: Impaired/Different from baseline Area of Impairment: Memory;Awareness;Following commands;Safety/judgement;Orientation;Attention;Problem solving                 Orientation Level: Disoriented to;Time;Situation;Place Current Attention Level: Sustained Memory: Decreased recall of precautions;Decreased short-term memory Following Commands: Follows one step commands inconsistently;Follows one step commands with increased time Safety/Judgement: Decreased awareness of safety;Decreased awareness of deficits Awareness: Intellectual Problem Solving: Slow processing;Decreased initiation;Difficulty sequencing;Requires verbal cues;Requires tactile cues General Comments: Hx of dementia. Pt with difficulty maintaining eyes open, despite cues. Unable to recall current month or year. Required increased time and repeated simple multi-modal cues to initiate and sequence all tasks. Encouragement needed to continue to progress mobility this date.      Exercises      General Comments General comments (skin integrity, edema, etc.): SpO2 as low as 80s% but wave forms unreliable      Pertinent Vitals/Pain Pain Assessment: Faces Faces Pain Scale: Hurts even more Pain Location: generalized with mobility Pain Descriptors / Indicators: Moaning;Discomfort;Grimacing Pain Intervention(s): Limited activity within patient's tolerance;Monitored during session;Repositioned    Home Living                      Prior Function            PT Goals (current goals can now be found in the care plan section) Acute Rehab PT Goals Patient Stated Goal: to improve PT Goal Formulation: With patient/family Time For Goal Achievement: 06/04/20 Potential to Achieve Goals: Good Progress towards PT goals: Progressing toward goals    Frequency    Min 4X/week      PT Plan  Current plan remains appropriate    Co-evaluation              AM-PAC PT "6 Clicks" Mobility   Outcome Measure  Help needed turning from your back to your side while in a flat bed without using bedrails?: A Lot Help needed moving from lying on your back to sitting on the side of a flat bed without using bedrails?: A Lot Help needed moving to and from a bed to a chair (including a wheelchair)?: A Lot Help needed standing up from a chair using your arms (e.g., wheelchair or bedside chair)?: A Lot Help needed to walk in hospital room?: A Lot Help needed climbing 3-5 steps with a railing? : Total 6 Click Score: 11    End of Session Equipment Utilized During Treatment: Gait belt Activity Tolerance: Patient tolerated treatment well Patient left: in chair;with call bell/phone within reach;with chair alarm set;with family/visitor present Nurse Communication: Mobility status;Other (comment) (leaking at rectal insert) PT Visit Diagnosis: Unsteadiness on feet (R26.81);Other abnormalities of gait and mobility (R26.89);Muscle weakness (generalized) (M62.81);Difficulty in walking, not elsewhere classified (R26.2);Other symptoms and signs involving the nervous system (R29.898)     Time: 2992-4268 PT Time Calculation (min) (ACUTE ONLY): 30 min  Charges:  $Gait Training: 8-22 mins $Therapeutic Activity: 8-22 mins                     Raymond Gurney, PT, DPT Acute Rehabilitation Services  Pager: [phone] Office: [phone]    Alexandra Mays 05/27/2020, 2:36 PM

## 2020-05-27 NOTE — Progress Notes (Signed)
SLP Cancellation Note  Patient Details Name: Alexandra Mays MRN: [id number] DOB: Mar 17, 1950   Cancelled treatment:       Reason Eval/Treat Not Completed: Fatigue/lethargy limiting ability to participate. Pt was insufficiently arousable for advanced PO trials or further cognitive assessment. Will continue efforts.  Kiko Ripp B. Murvin Natal, [facility], CCC-SLP Speech Language Pathologist Office: [phone] Pager: [phone]  Leigh Aurora 05/27/2020, 2:35 PM

## 2020-05-27 NOTE — Progress Notes (Signed)
IP rehab admissions - met with grand daughter and patient at the bedside.  Not medically ready for CIR today.  Will follow along.  Call for questions.  [phone]

## 2020-05-27 NOTE — Progress Notes (Signed)
NAME:  Alexandra Mays, MRN:  [id number], DOB:  01/04/1951, LOS: 7 ADMISSION DATE:  05/20/2020, CONSULTATION DATE: 05/21/2020 REFERRING MD: Dr. 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 AAA (abdominal aortic aneurysm) (HCC)    AAA  . Abdominal aortic aneurysm (AAA) without rupture (HCC) 04/21/2015  . Acute respiratory failure with hypoxia (HCC) 05/14/2020  . Cigarette smoker 03/29/2015  . COPD (chronic obstructive pulmonary disease) (HCC)   . 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 Depressive disorder 02/17/2015  . Dyslipidemia 03/29/2015  . Dyspnea    with exertion  . Essential hypertension 05/14/2020  . Hyperlipemia 05/14/2020  . Leukocytosis 05/14/2020  . Mild cognitive impairment with memory loss 02/17/2015  . NSTEMI (non-ST elevated myocardial infarction) (HCC) 05/13/2020  . Protein-calorie malnutrition, severe 05/25/2020  . Pure hyperglyceridemia   . S/P aortic aneurysm repair 07/24/2016  . Stress-induced cardiomyopathy   . Subarachnoid hemorrhage due to cerebral aneurysm (HCC) 05/20/2020  . Tobacco abuse 05/14/2020     Significant Hospital Events:    Consults:  Neurosurgery PCCM  Procedures:  3/10 angiogram with coiling of right P-comm aneurysm  Significant Diagnostic Tests:  3/10 CT head: Subarachnoid hemorrhage 3/10 CT angiogram of head and neck: Ruptured right P-comm aneurysm 3/11 CT head: Diffuse areas of high attenuation throughout the bilateral sulci, likely a combination of residual subarachnoid hemorrhage and postprocedural contrast staining. 2. Persistent subarachnoid hemorrhage in the suprasellar region, with intraventricular  hemorrhage in the bilateral lateral ventricles and third ventricles as above. 3. Mild hydrocephalus which has developed in the interim. 4. Developing hypodensities within the right insula and frontotemporal regions, likely representing acute cortical infarct. 5. Diffuse hypodensities throughout the periventricular white matter, likely representing an element of transependymal CSF flow and chronic small vessel ischemic change  Micro Data:  3/10 MRSA PCR negative 3/10 Covid and influenza PCR negative  Antimicrobials:     Interim History / Subjective:   Agitation and hyperactivity overnight requiring soft restraints. Pt is alert this morning answering questions appropriately and wearing her glasses. Reports that her headache and neck pain have improved. Tires easily after short conversation. Cortrak placed and NG tube removed. Afebrile, normal respiratory effort, oxygenating well on RA.   Objective   Blood pressure (!) 144/66, pulse 60, temperature 98.1 F (36.7 C), temperature source Axillary, resp. rate (!) 21, height 5\' 4"  (1.626 m), weight 41.8 kg, SpO2 99 %.        Intake/Output Summary (Last 24 hours) at 05/27/2020 0803 Last data filed at 05/27/2020 0600 Gross per 24 hour  Intake 2783.18 ml  Output 1400 ml  Net 1383.18 ml   Filed Weights   05/20/20 0920 05/26/20 0458  Weight: 37.8 kg 41.8 kg    Examination: General: Cachectic, awake and alert, chronically ill appearing elderly female lying comfortably in bed, soft wrist restraints in place, NAD HEENT: Dry mucous membranes, Cortrak in place, glasses on her face Neuro: awake with eyes open upon entering the room, EOMI, alert and oriented to person place and situation, able to lift arms and legs on command, responds with full sentences but tires easily after short conversation Chest: intermittent cough, expiratory ronchi bilaterally, no  accessory muscle use Heart: RRR, normal S1S2, no MRG Extremities: No peripheral edema,  1+ bilateral radial pulses Abdomen: Soft, nontender, nondistended, positive bowel sounds   Resolved Hospital Problem list     Assessment & Plan:   Aneurysmal subarachnoid hemorrhage H/H 2, MF 3 due to ruptured P-comm aneurysm status post coiling -improved mental status this AM -if normal TCDs tomorrow, plan to transfer to floor -d/c Keppra -cont. Nimodipine q4h -maintain sys BP 120-160 -elevate HOB to 30 degrees -q4h neuro checks  ICU Delirium -waxing/waning somnolence with sleep-wake cycle disturbance -Melatonin 3mg  QHS -Seroquel 12.5 mg QHS -d/c Urecholine -d/c keppra -delirium precautions - frequent orientation, scheduled meals, reduce tubing and wires present on patient as able to, pain control  Ischemic chronic systolic congestive heart failure, EF 20 to 25% -restart coreg -restart losartan tomorrow prior to transfer to floor -cont simvastatin -d/c IVF -clinically remains euvolemic -strict I&O  Malnutrition Pt is cachectic on exam, clear muscle wasting with prominence of the clavicles and atrophic quadriceps -Cortrak with nocturnal tube feeds -follow mag, phos, K for refeeding -abdominal x-ray -plan for inpatient endoscopy while on the floor prior to discharge to rehab facility -cont. famotidine  Mild to moderate hydrocephalus -no CT required with improving mental status  Hyponatremia/hypokalemia, Resolved -Na 136 this AM -K 3.8 -f/u BMP  COPD, not in exacerbation -cont. Breo Ellipta, Incruse Ellipta QD -cont albuterol PRN  Diarrhea -fiber supplement -hold laxatives/stool softeners -abdominal x-ray as above   Dementia -not on medication at home -cont. supportive care - frequent orientation, daytime meals, pain mangement, melatonin, seroquel QHS  Best practice (evaluated daily)  Diet: Tube feeds Pain/Anxiety/Delirium protocol (if indicated): Tylenol PRN VAP protocol (if indicated): N/A DVT prophylaxis: SCDs GI prophylaxis: Famotidine Glucose  control: SSI Mobility: As tolerated Disposition: Full code  Goals of Care:  Last date of multidisciplinary goals of care discussion: Per primary team Code Status: Full code  Labs   CBC: Recent Labs  Lab 05/20/20 1044 05/20/20 1055 05/23/20 0428  WBC 9.4  --  12.8*  NEUTROABS 7.4  --   --   HGB 12.0 11.9* 12.9  HCT 35.8* 35.0* 36.6  MCV 95.0  --  88.0  PLT 151  --  205    Basic Metabolic Panel: Recent Labs  Lab 05/20/20 1044 05/20/20 1055 05/23/20 0428 05/24/20 1256 05/24/20 1809 05/25/20 0720 05/25/20 1644 05/26/20 0834 05/27/20 0306  NA 132* 135 127*  --  131*  --   --  135 136  K 3.7 3.7 2.9*  --  3.7  --   --  3.0* 3.8  CL 102 100 96*  --  106  --   --  106 109  CO2 25  --  21*  --  19*  --   --  21* 19*  GLUCOSE 93 85 90  --  98  --   --  142* 110*  BUN 17 21 14   --  19  --   --  12 12  CREATININE 0.83 0.70 0.55  --  0.63  --   --  0.59 0.46  CALCIUM 8.4*  --  8.5*  --  8.0*  --   --  8.3* 8.2*  MG  --   --  1.8   < > 1.9 2.0 1.8 1.7 2.1  PHOS  --   --  2.2*   < > 2.4* 2.1* 2.9 2.6 2.7   < > = values in this interval not displayed.   GFR:  Estimated Creatinine Clearance: 42.6 mL/min (by C-G formula based on SCr of 0.46 mg/dL). Recent Labs  Lab 05/20/20 1044 05/23/20 0428  WBC 9.4 12.8*    Liver Function Tests: Recent Labs  Lab 05/20/20 1044  AST 26  ALT 17  ALKPHOS 57  BILITOT 0.5  PROT 5.5*  ALBUMIN 3.0*   No results for input(s): LIPASE, AMYLASE in the last 168 hours. No results for input(s): AMMONIA in the last 168 hours.  ABG    Component Value Date/Time   PHART 7.412 05/14/2020 1347   PCO2ART 36.8 05/14/2020 1347   PO2ART 82 (L) 05/14/2020 1347   HCO3 23.7 05/14/2020 1354   TCO2 26 05/20/2020 1055   ACIDBASEDEF 1.0 05/14/2020 1354   O2SAT 67.0 05/14/2020 1354     Coagulation Profile: Recent Labs  Lab 05/20/20 1044 05/20/20 1836  INR 1.2 1.2    Cardiac Enzymes: No results for input(s): CKTOTAL, CKMB, CKMBINDEX,  TROPONINI in the last 168 hours.  HbA1C: Hgb A1c MFr Bld  Date/Time Value Ref Range Status  05/14/2020 02:40 AM 5.8 (H) 4.8 - 5.6 % Final    Comment:    (NOTE) Pre diabetes:          5.7%-6.4%  Diabetes:              >6.4%  Glycemic control for   <7.0% adults with diabetes     CBG: Recent Labs  Lab 05/26/20 1521 05/26/20 1938 05/26/20 2308 05/27/20 0314 05/27/20 0732  GLUCAP 102* 136* 142* 118* 125*     Past Medical History:  She,  has a past medical history of AAA (abdominal aortic aneurysm) (HCC), Abdominal aortic aneurysm (AAA) without rupture (HCC) (04/21/2015), Acute respiratory failure with hypoxia (HCC) (05/14/2020), Cigarette smoker (03/29/2015), COPD (chronic obstructive pulmonary disease) (HCC), 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   Surgical History:   Past Surgical History:  Procedure Laterality Date  . ABDOMINAL AORTIC ANEURYSM REPAIR N/A 07/24/2016   Procedure: ABDOMINAL AORTIC ANEURYSM  REPAIR;  Surgeon: Sherren Kerns, MD;  Location: [facility] OR;  Service: Vascular;  Laterality: N/A;  . cardiac stenting  2007  . ENDARTERECTOMY Left 07/24/2016   Procedure: AORTA TO LEFT ILIAC ENDARTERECTOMY;  Surgeon: Sherren Kerns, MD;  Location: [facility] OR;  Service: Vascular;  Laterality: Left;  . ENDARTERECTOMY FEMORAL Right 07/24/2016   Procedure: AORTA TO RIGHT FEMORAL ENDARTERECTOMY;  Surgeon: Sherren Kerns, MD;  Location: [facility] OR;  Service:  Vascular;  Laterality: Right;  . IR ANGIO INTRA EXTRACRAN SEL INTERNAL CAROTID BILAT MOD SED  05/20/2020  . IR ANGIO VERTEBRAL SEL VERTEBRAL UNI R MOD SED  05/20/2020  . IR ANGIOGRAM FOLLOW UP STUDY  05/20/2020  . IR ANGIOGRAM FOLLOW UP STUDY  05/20/2020  . IR CT HEAD LTD  05/20/2020  . IR TRANSCATH/EMBOLIZ  05/20/2020  . RADIOLOGY WITH ANESTHESIA N/A 05/20/2020   Procedure: IR WITH ANESTHESIA - ANEURYSM;  Surgeon: Lisbeth Renshaw, MD;  Location: [facility] OR;  Service: Radiology;  Laterality: N/A;  . RIGHT/LEFT HEART CATH AND CORONARY ANGIOGRAPHY N/A 05/14/2020   Procedure: RIGHT/LEFT HEART CATH AND CORONARY ANGIOGRAPHY;  Surgeon: Tonny Bollman, MD;  Location: [facility] INVASIVE CV LAB;  Service: Cardiovascular;  Laterality: N/A;  . TOE SURGERY Left    3 rd toe Hammer toe     Social History:   reports that she has been smoking cigarettes. She has a 30.00 pack-year smoking history. She has never used smokeless tobacco. She reports that she does not drink alcohol and does not use drugs.   Family History:  Her family history includes Stroke in her mother.   Allergies Allergies  Allergen Reactions  . No Known Allergies      Home Medications  Prior to Admission medications   Medication Sig Start Date End Date Taking? Authorizing Provider  albuterol (PROVENTIL HFA;VENTOLIN HFA) 108 (90 Base) MCG/ACT inhaler Inhale 2 puffs into the lungs every 6 (six) hours as needed for wheezing or shortness of breath.   Yes [provider]  albuterol (PROVENTIL) (2.5 MG/3ML) 0.083% nebulizer solution Take 3 mLs (2.5 mg total) by nebulization every 6 (six) hours as needed for wheezing or shortness of breath. 11/21/19  Yes Hunsucker, Lesia Sago, MD  BREO ELLIPTA 100-25 MCG/INH AEPB Inhale 1 puff into the lungs daily. 04/16/20  Yes [provider]  carvedilol (COREG) 3.125 MG tablet Take 1 tablet (3.125 mg total) by mouth 2 (two) times daily with a meal. 05/16/20  Yes Zannie Cove, MD  ibuprofen  (ADVIL,MOTRIN) 200 MG tablet Take 800 mg by mouth every 6 (six) hours as needed for mild pain.   Yes [provider]  losartan (COZAAR) 25 MG tablet Take 0.5 tablets (12.5 mg total) by mouth daily. 05/16/20  Yes Zannie Cove, MD  Multiple Vitamin (MULTIVITAMIN WITH MINERALS) TABS tablet Take 1 tablet by mouth daily.   Yes [provider]  simvastatin (ZOCOR) 20 MG tablet Take 20 mg by mouth every morning.    Yes [provider]  Tiotropium Bromide Monohydrate 2.5 MCG/ACT AERS Inhale 2 puffs into the lungs daily.   Yes [provider]  polyethylene glycol (MIRALAX / GLYCOLAX) packet Take 17 g by mouth daily. Patient not taking: No sig reported 06/23/16   Forest Becker, MD   Jori Moll, Medical Student 05/27/2020 , 8:03 AM

## 2020-05-28 ENCOUNTER — Inpatient Hospital Stay (HOSPITAL_COMMUNITY): Payer: Medicare Other

## 2020-05-28 ENCOUNTER — Ambulatory Visit: Payer: Medicare Other | Admitting: Cardiology

## 2020-05-28 DIAGNOSIS — I255 Ischemic cardiomyopathy: Secondary | ICD-10-CM | POA: Diagnosis not present

## 2020-05-28 DIAGNOSIS — I609 Nontraumatic subarachnoid hemorrhage, unspecified: Secondary | ICD-10-CM

## 2020-05-28 LAB — GLUCOSE, CAPILLARY
Glucose-Capillary: 126 mg/dL — ABNORMAL HIGH (ref 70–99)
Glucose-Capillary: 126 mg/dL — ABNORMAL HIGH (ref 70–99)
Glucose-Capillary: 141 mg/dL — ABNORMAL HIGH (ref 70–99)
Glucose-Capillary: 131 mg/dL — ABNORMAL HIGH (ref 70–99)
Glucose-Capillary: 164 mg/dL — ABNORMAL HIGH (ref 70–99)
Glucose-Capillary: 135 mg/dL — ABNORMAL HIGH (ref 70–99)

## 2020-05-28 LAB — BASIC METABOLIC PANEL
Anion gap: 8 (ref 5–15)
BUN: 12 mg/dL (ref 8–23)
CO2: 21 mmol/L — ABNORMAL LOW (ref 22–32)
Calcium: 8.1 mg/dL — ABNORMAL LOW (ref 8.9–10.3)
Chloride: 101 mmol/L (ref 98–111)
Creatinine, Ser: 0.46 mg/dL (ref 0.44–1.00)
GFR, Estimated: >60 mL/min (ref >60)
Glucose, Bld: 124 mg/dL — ABNORMAL HIGH (ref 70–99)
Potassium: 3.7 mmol/L (ref 3.5–5.1)
Sodium: 130 mmol/L — ABNORMAL LOW (ref 135–145)

## 2020-05-28 LAB — CBC
HCT: 36.2 % (ref 36.0–46.0)
Hemoglobin: 12.4 g/dL (ref 12.0–15.0)
MCH: 30.9 pg (ref 26.0–34.0)
MCHC: 34.3 g/dL (ref 30.0–36.0)
MCV: 90.3 fL (ref 80.0–100.0)
Platelets: 203 10*3/uL (ref 150–400)
RBC: 4.01 MIL/uL (ref 3.87–5.11)
RDW: 13.5 % (ref 11.5–15.5)
WBC: 12.9 10*3/uL — ABNORMAL HIGH (ref 4.0–10.5)
nRBC: 0 % (ref 0.0–0.2)

## 2020-05-28 LAB — MAGNESIUM: Magnesium: 1.8 mg/dL (ref 1.7–2.4)

## 2020-05-28 IMAGING — CT CT HEAD W/O CM
4 series · 16 of 47 positions shown, 18 images · non-contrast
Comparison: [DATE]

CLINICAL DATA: Hydrocephalus, intracranial hemorrhage follow-up

EXAM:
CT HEAD WITHOUT CONTRAST
TECHNIQUE: Contiguous axial images were obtained from the base of the skull
through the vertex without intravenous contrast.

[Series 2: head wo · axial · 0.43mm/px · z∈[+1330,+1450]mm · 7 of 33 slices shown, 9 images]
[im 5/33  brain]
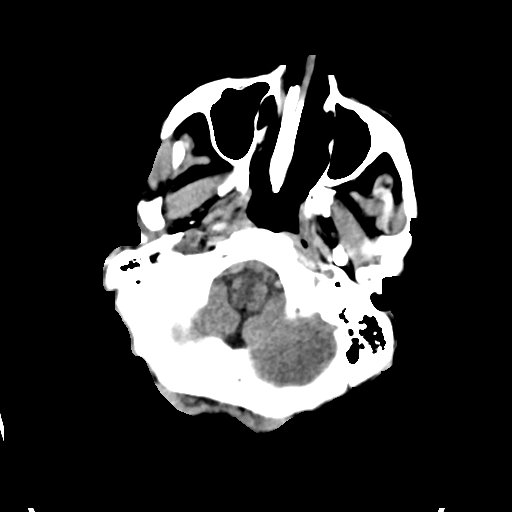
[im 5/33  bone]
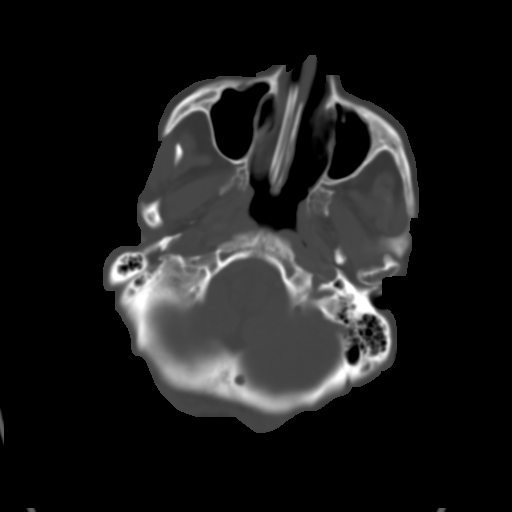
[im 9/33  brain]
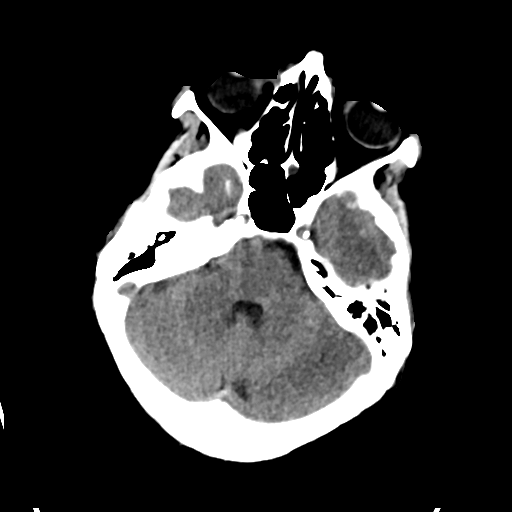
[im 13/33  brain]
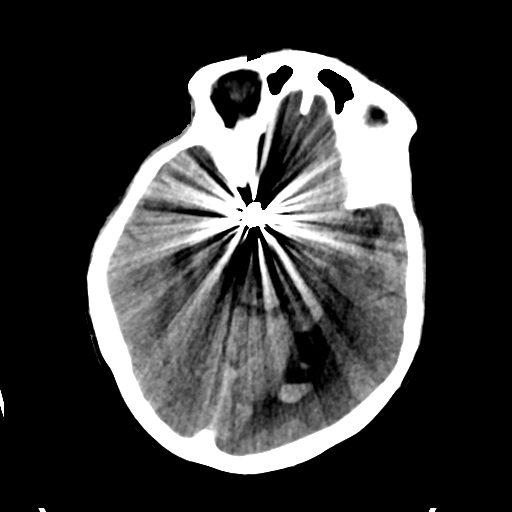
[im 17/33  brain]
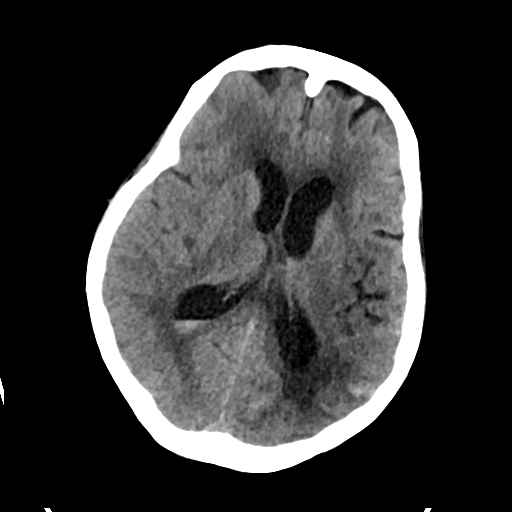
[im 21/33  brain]
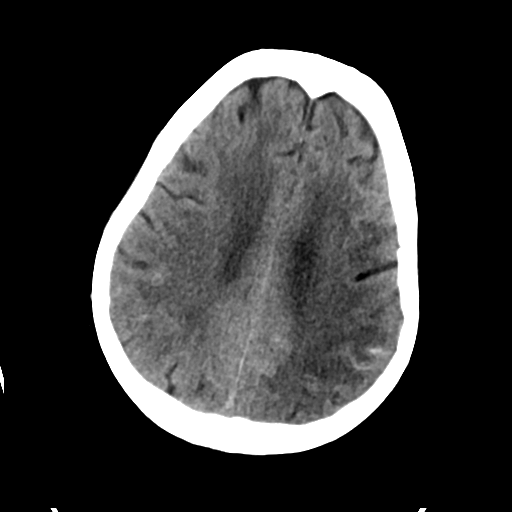
[im 21/33  bone]
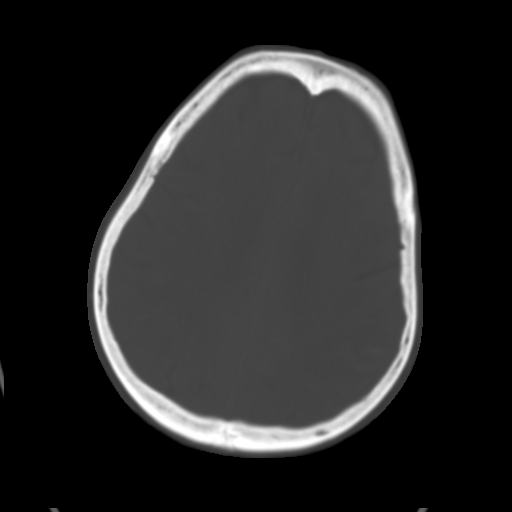
[im 25/33  brain]
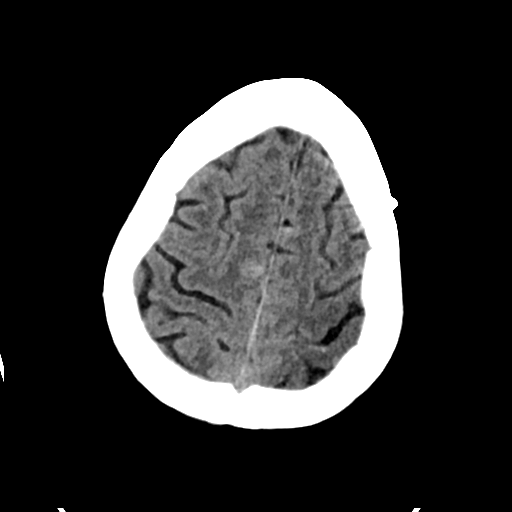
[im 29/33  brain]
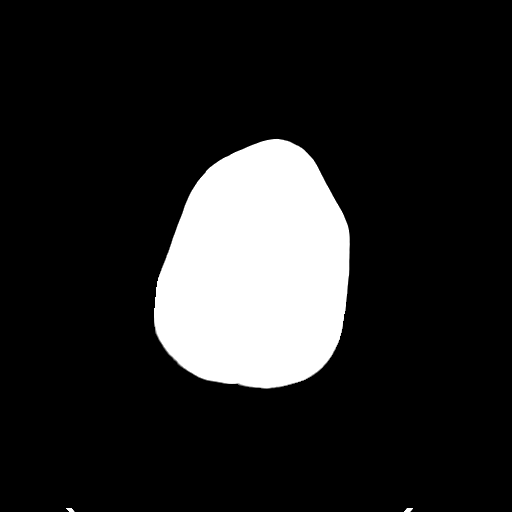

[Series 3: head bone · axial · 0.43mm/px · z∈[+1326,+1358]mm · 3 of 82 slices shown]
[im 9/82  bone]
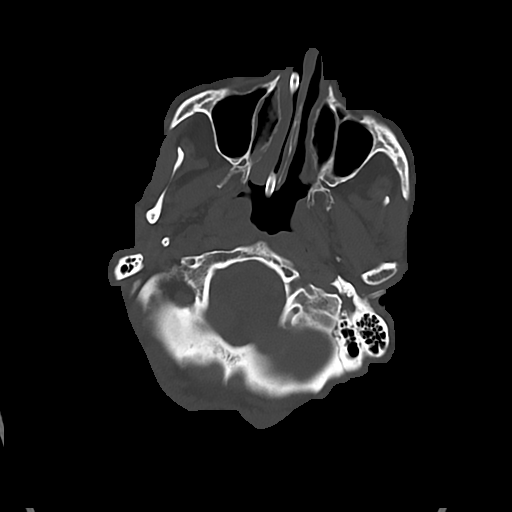
[im 17/82  bone]
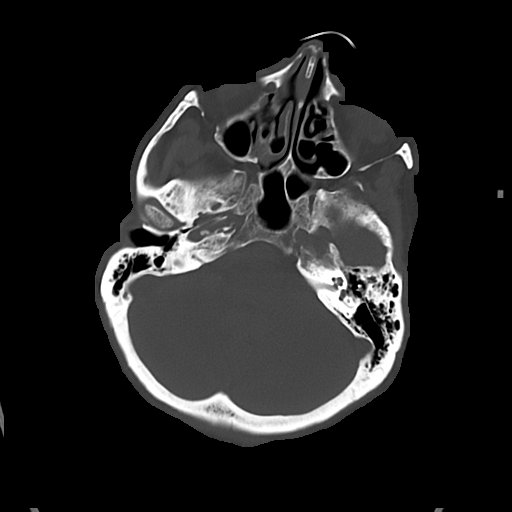
[im 25/82  bone]
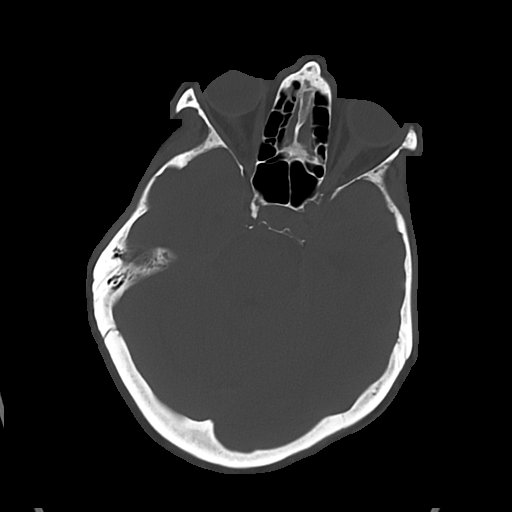

[Series 4: cor soft · coronal · 0.32mm/px · 3 of 65 slices shown]
[im 22/65  brain]
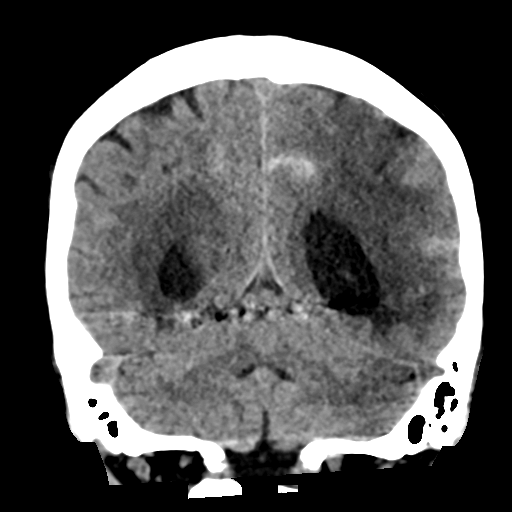
[im 29/65  brain]
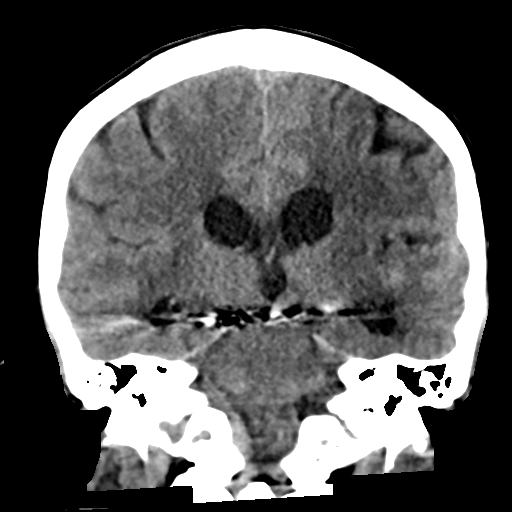
[im 36/65  brain]
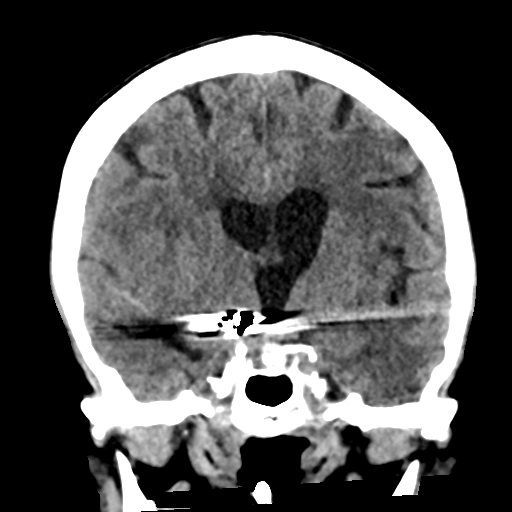

[Series 5: sag soft · sagittal · 0.32mm/px · 3 of 56 slices shown]
[im 19/56  brain]
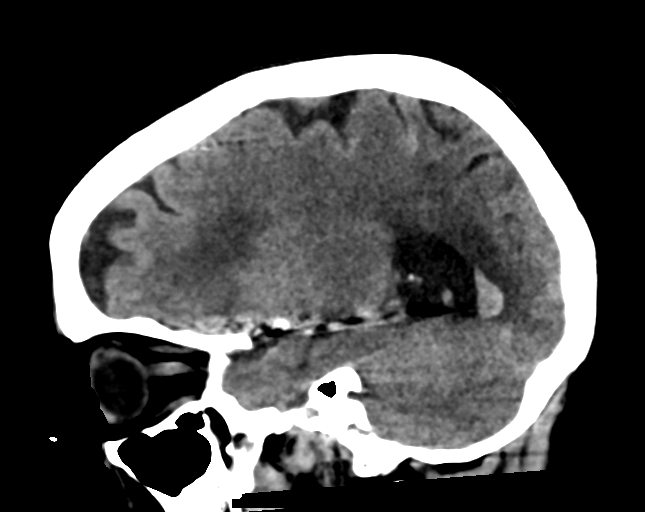
[im 28/56  brain]
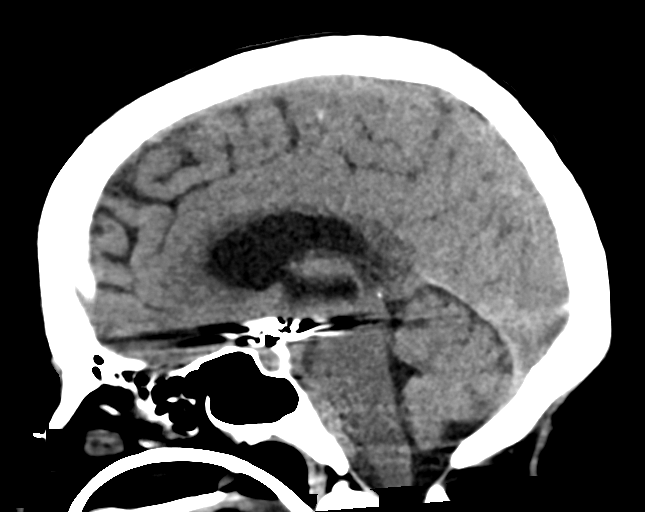
[im 37/56  brain]
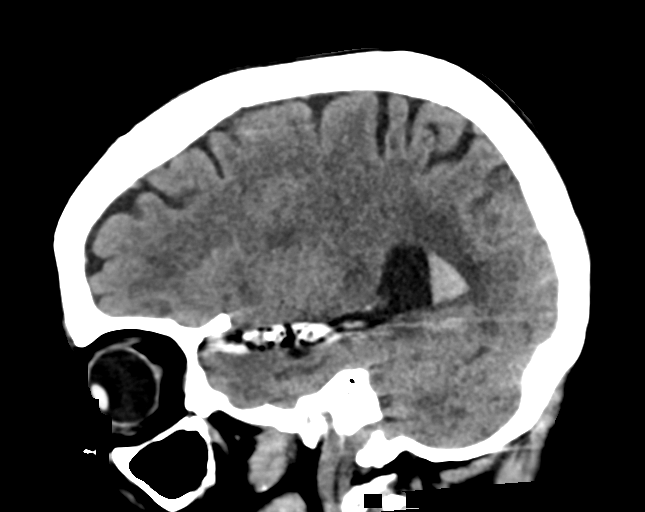

[16 of 47 positions shown; findings below may reference images not displayed]

FINDINGS: Brain: Scattered sulcal subarachnoid hemorrhage is again identified
within convexity sulci. Intraventricular hemorrhage is also again
seen layering in the occipital horns. There has been some interval
redistribution. No new hemorrhage.

New hypoattenuation and loss of gray-white differentiation posterior
left frontal lobe, parietal lobe, and possibly occipital and
temporal lobes. There is also question acute left cerebellar
infarct. No significant mass effect.

Enlargement the ventricles reflecting hydrocephalus substantially
changed.

Additional patchy and confluent areas hypoattenuation in the
supratentorial white matter probably reflect stable chronic
microvascular ischemic changes.

Vascular: Right supraclinoid coil with associated streak
artifact.There is atherosclerotic calcification at the skull base.

Skull: Calvarium is unremarkable.

Sinuses/Orbits: No acute finding.

Other: None.
IMPRESSION: Evolving sulcal and intra-articular subarachnoid hemorrhage with
some interval redistribution. No new hemorrhage.

New left MCA, left PICA, and possibly PCA territory infarcts
probably secondary to vasospasm.

These results will be called to the ordering clinician or
representative by the Radiologist Assistant, and communication
documented in the PACS or [REDACTED].

## 2020-05-28 IMAGING — MR MR MRA HEAD W/O CM
1 series · 17 of 48 positions shown · non-contrast
Comparison: Prior CT from [DATE] as well as earlier studies.

CLINICAL DATA: 69-year-old female with history of rupture right
posterior communicating artery aneurysm, status post coil
embolization, concern for stroke, vasospasm.

EXAM:
MRI HEAD WITHOUT CONTRAST
MRA HEAD WITHOUT CONTRAST
TECHNIQUE: Multiplanar, multiecho pulse sequences of the brain and surrounding
structures were obtained without intravenous contrast. Angiographic
images of the head were obtained using MRA technique without
contrast.

[Series 7: 3d cow · axial · 0.5mm · 0.41mm/px · z∈[-53,+40]mm · 17 of 204 slices shown]
[im 1/204]
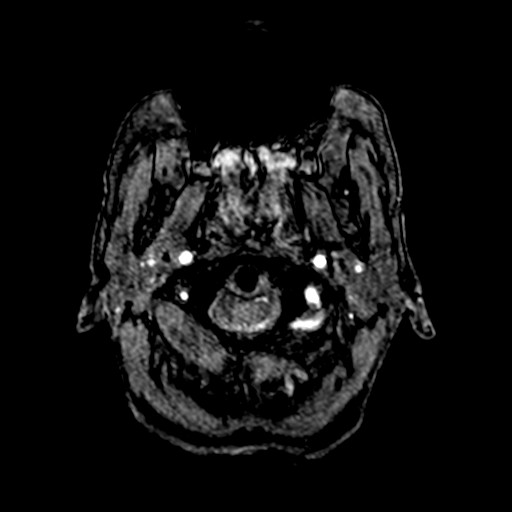
[im 5/204]
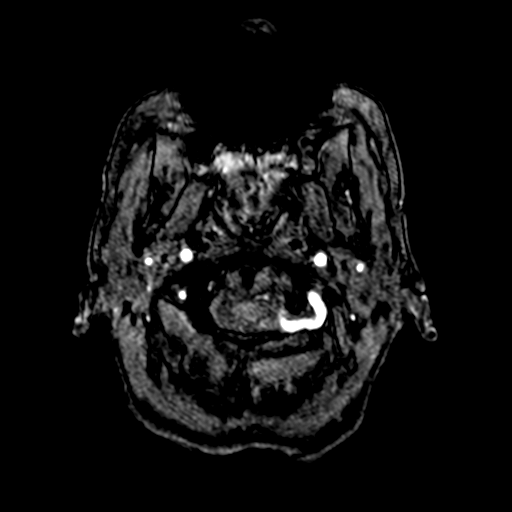
[im 9/204]
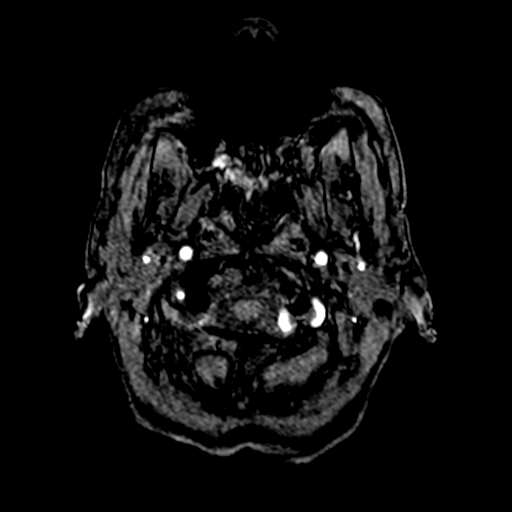
[im 13/204]
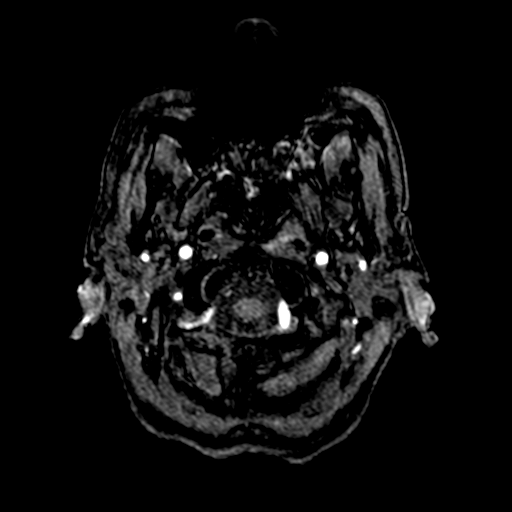
[im 18/204]
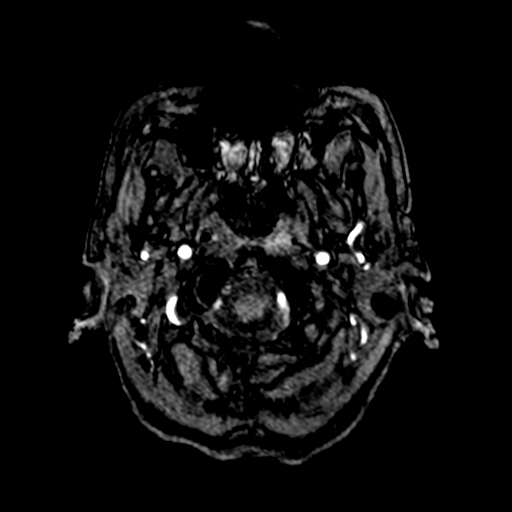
[im 22/204]
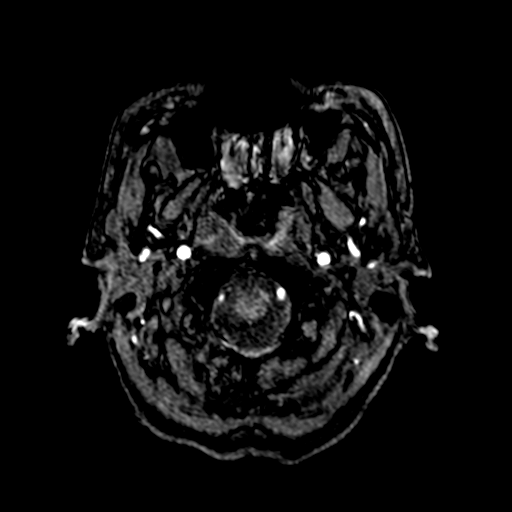
[im 26/204]
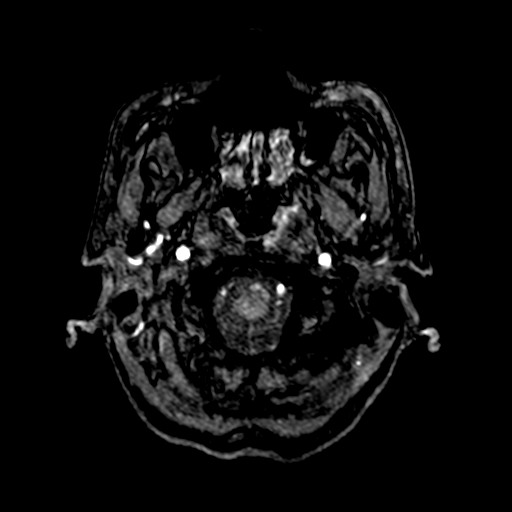
[im 35/204]
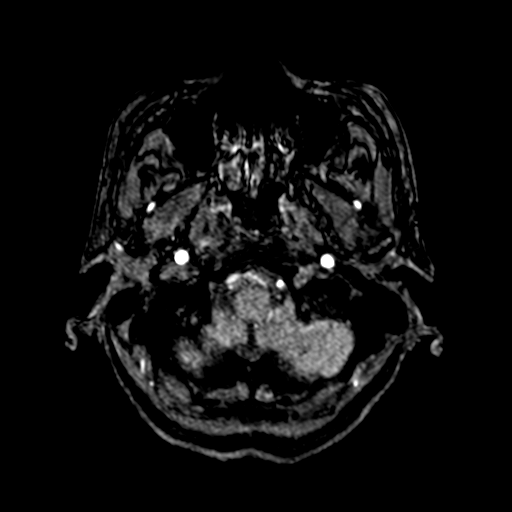
[im 39/204]
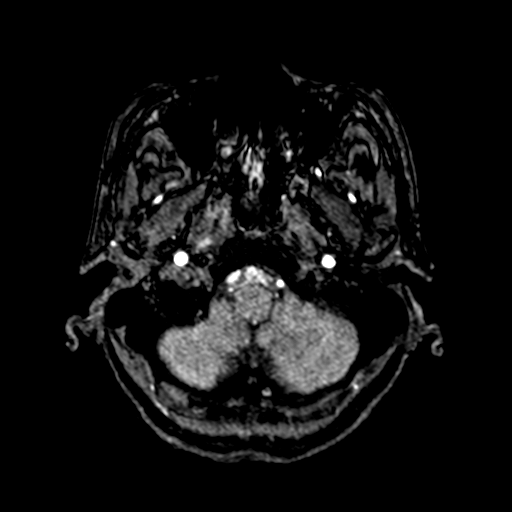
[im 65/204]
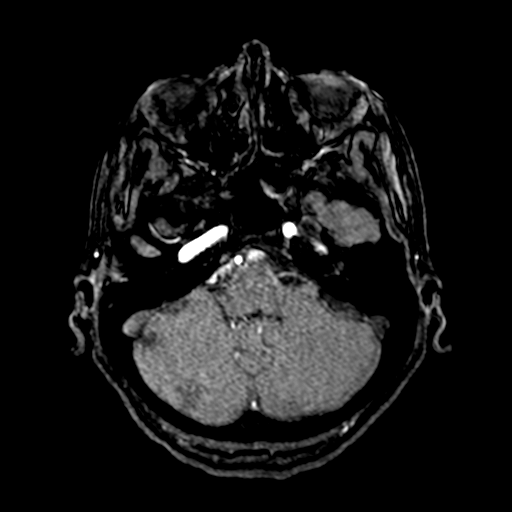
[im 91/204]
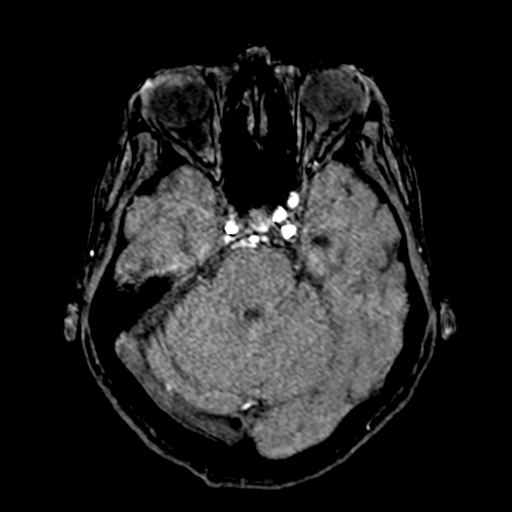
[im 104/204]
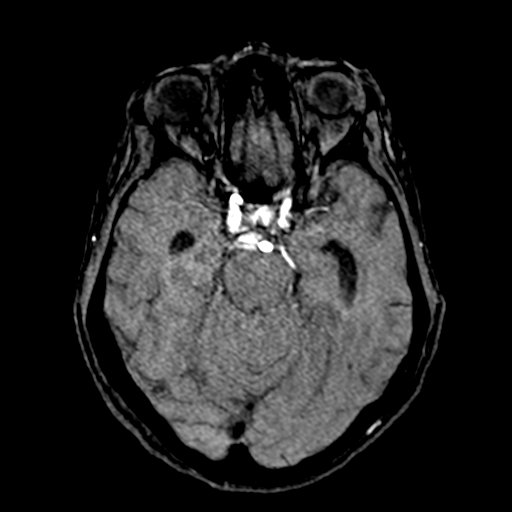
[im 117/204]
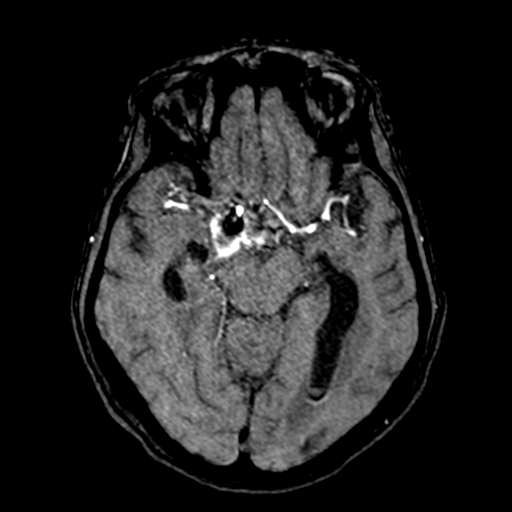
[im 143/204]
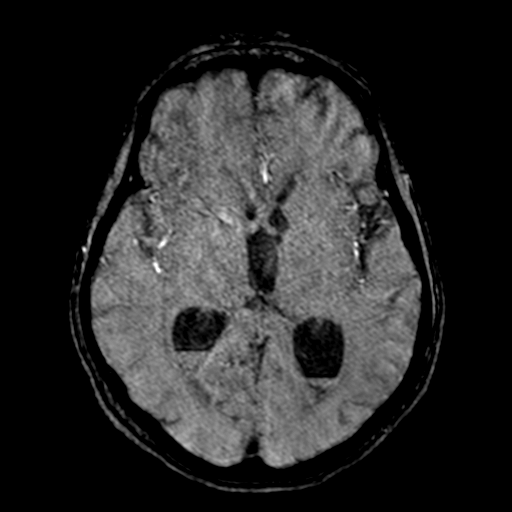
[im 169/204]
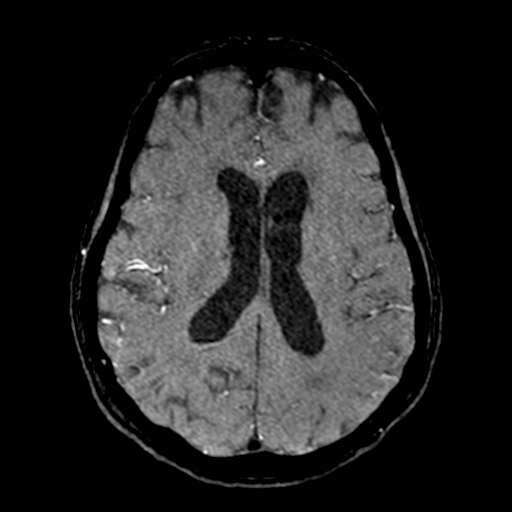
[im 173/204]
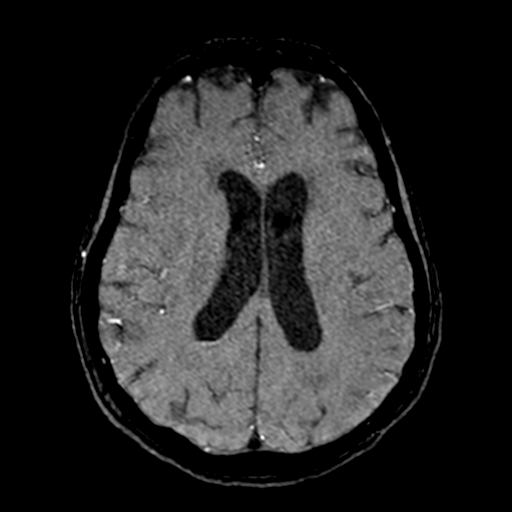
[im 195/204]
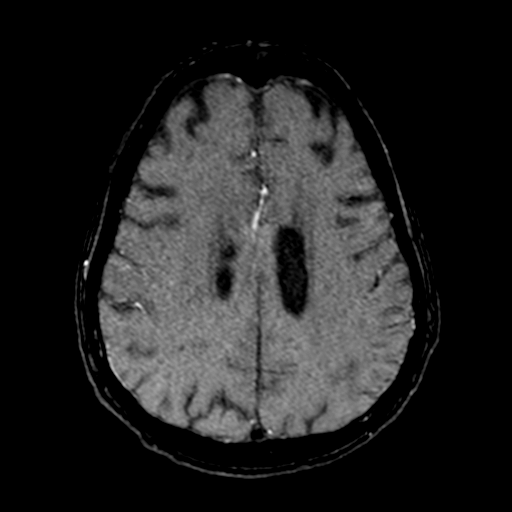

[17 of 48 positions shown; findings below may reference images not displayed]

FINDINGS: MRI HEAD FINDINGS

Brain: Generalized age-related cerebral atrophy with chronic
microvascular ischemic disease. Small remote lacunar infarct at the
left caudate head.

Extensive FLAIR signal intensity seen layering throughout multiple
cortical sulci, consistent with known subarachnoid hemorrhage.
Additional subarachnoid blood seen within the basilar cisterns,
predominantly localized about the coiled right PCOM aneurysm.
Evidence for redistribution with intraventricular hemorrhage
layering primarily within the occipital horns both lateral
ventricles. Associated hydrocephalus with evidence for
transependymal flow of CSF, relatively stable from previous.

At small 1.1 cm curvilinear cortical infarct noted at the high
posterior right parietal lobe (series 11, image 87). Additional
small volume patchy diffusion abnormality involves the central
aspect of the splenium, also suspicious for acute ischemia (series
11, image 77, 78). No associated hemorrhage or mass effect.
Otherwise, no other convincing diffusion abnormality to suggest
acute or subacute ischemia. Gray-white matter differentiation
maintained. No encephalomalacia to suggest chronic cortical
infarction elsewhere within the brain.

No mass lesion, midline shift, or mass effect. Pituitary gland
within normal limits. Midline structures intact.

Vascular: Sequelae of prior coil embolization of right PCOM
aneurysm. Major intracranial vascular flow voids maintained.

Skull and upper cervical spine: Craniocervical junction within
normal limits. Upper cervical spine normal. Bone marrow signal
intensity normal. No focal marrow replacing lesion. No scalp soft
tissue abnormality.

Sinuses/Orbits: Globes and orbital soft tissues within normal
limits. Scattered mucosal thickening noted within the paranasal
sinuses. Nasogastric tube in place. Trace bilateral mastoid
effusions noted.

Other: None.

MRA HEAD FINDINGS

ANTERIOR CIRCULATION:

Examination moderately degraded by motion artifact.

Visualized distal cervical segments of the internal carotid arteries
are patent with antegrade flow. Petrous, cavernous, and supraclinoid
segments of both internal carotid arteries patent without stenosis.
Susceptibility artifact related to recently coiled right PCOM
aneurysm. Small irregular neck remnant measuring up to 4 mm is seen
partially filling the aneurysm sac (series 7, image 121). A1
segments patent. Grossly normal anterior communicating artery
complex. Distal ACAs not well assessed due to motion, but are
grossly patent and perfused. M1 segments remain patent. Normal MCA
bifurcations. Distal MCA branches perfused and fairly symmetric. No
overt evidence for significant vasospasm.

POSTERIOR CIRCULATION:

Dominant left vertebral artery widely patent to the vertebrobasilar
junction. Right vertebral artery diminutive and largely terminates
in PICA. Both PICA patent. Basilar patent to its distal aspect
without stenosis. Superior cerebellar arteries grossly patent
proximally. Basilar tip ectatic without frank aneurysm. Both PCAs
primarily supplied via the basilar and appears somewhat small, but
appear patent and perfused to their distal aspects. No evidence for
significant vasospasm.
IMPRESSION: MRI HEAD IMPRESSION:

1. Sequelae of recently ruptured right PCOM aneurysm with extensive
subarachnoid hemorrhage and evidence for redistribution. Associated
hydrocephalus with transependymal flow of CSF. Overall, appearance
is stable from previous.
2. Few small ischemic infarcts involving the high posterior right
parietal cortex and likely the splenium as above. No other evidence
for acute ischemia. Previously questioned left-sided infarcts on
prior head CT or most likely artifactual on that exam.

MRA HEAD IMPRESSION:

1. Motion degraded exam.
2. Sequelae of recently coiled right PCOM aneurysm. Persistent
irregular 4 mm neck remnant partially fills the aneurysm sac.
3. Otherwise negative and stable intracranial MRA. No other large
vessel occlusion or hemodynamically significant stenosis. No overt
evidence for significant vasospasm on this motion degraded exam.

## 2020-05-28 IMAGING — MR MR HEAD W/O CM
13 of 14 series · 44 of 48 positions shown · non-contrast
Comparison: Prior CT from [DATE] as well as earlier studies.

CLINICAL DATA: 69-year-old female with history of rupture right
posterior communicating artery aneurysm, status post coil
embolization, concern for stroke, vasospasm.

EXAM:
MRI HEAD WITHOUT CONTRAST
MRA HEAD WITHOUT CONTRAST
TECHNIQUE: Multiplanar, multiecho pulse sequences of the brain and surrounding
structures were obtained without intravenous contrast. Angiographic
images of the head were obtained using MRA technique without
contrast.

[Series 9: DWI · coronal · 4.0mm · 0.88mm/px · 4 of 68 slices shown (1 of 4)]
[im 1/68]
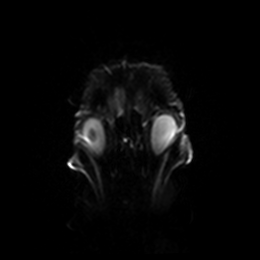
[im 23/68]
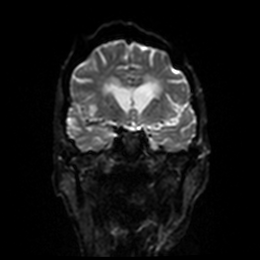
[im 45/68]
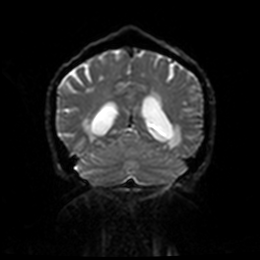
[im 68/68]
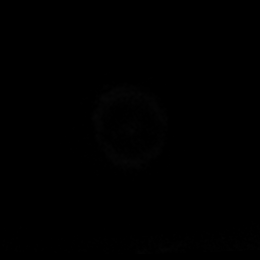

[Series 10: DWI · coronal · 4.0mm · 0.88mm/px · 3 of 34 slices shown (2 of 4)]
[im 1/34]
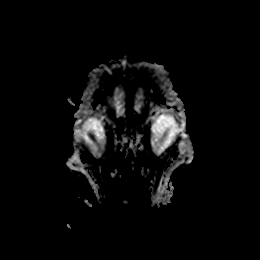
[im 17/34]
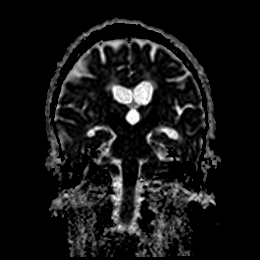
[im 34/34]
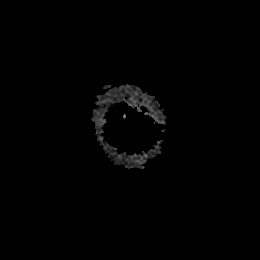

[Series 11: DWI · axial · 3.0mm · 0.88mm/px · z∈[-44,+90]mm · 7 of 96 slices shown (3 of 4)]
[im 1/96]
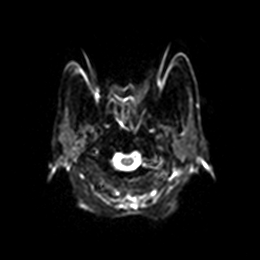
[im 16/96]
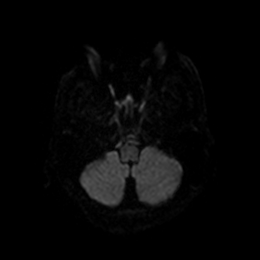
[im 32/96]
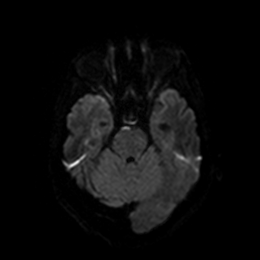
[im 48/96]
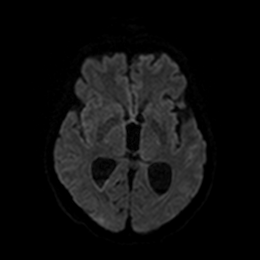
[im 64/96]
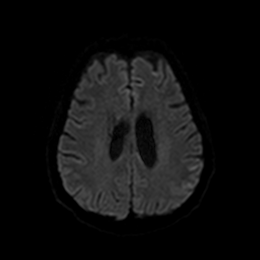
[im 80/96]
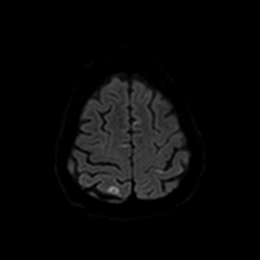
[im 96/96]
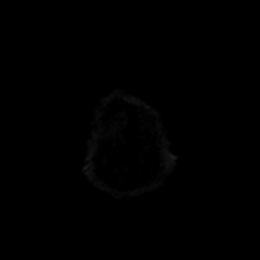

[Series 12: DWI · axial · 3.0mm · 0.88mm/px · z∈[-44,+90]mm · 4 of 48 slices shown (4 of 4)]
[im 1/48]
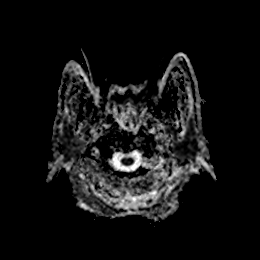
[im 16/48]
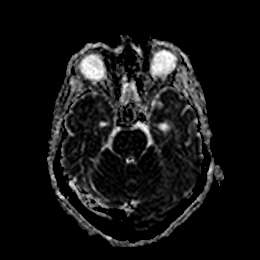
[im 32/48]
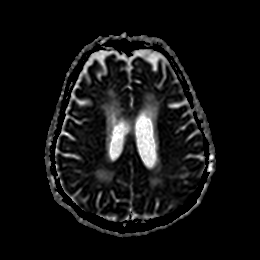
[im 48/48]
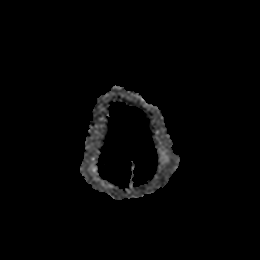

[Series 13: mag_images · axial · 3.0mm · 0.90mm/px · z∈[-60,+98]mm · 4 of 56 slices shown]
[im 1/56]
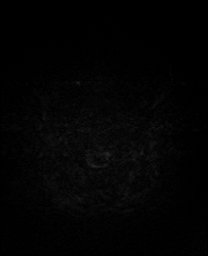
[im 19/56]
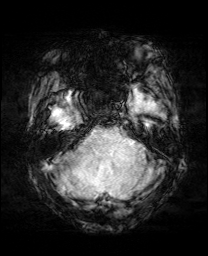
[im 37/56]
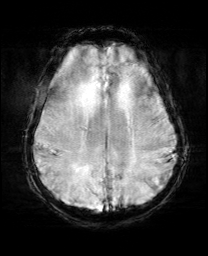
[im 56/56]
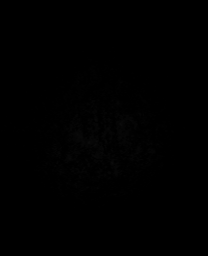

[Series 14: pha_images · axial · 3.0mm · 0.90mm/px · z∈[-57,+98]mm · 4 of 55 slices shown]
[im 1/55]
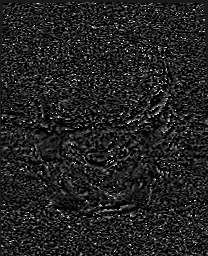
[im 19/55]
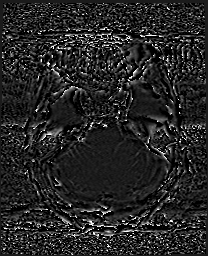
[im 37/55]
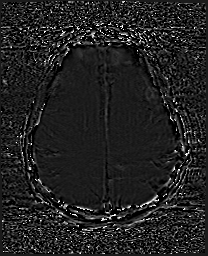
[im 55/55]
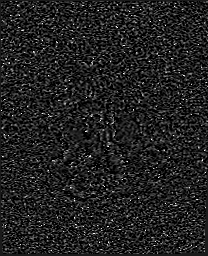

[Series 15: swi_images · axial · 3.0mm · 0.90mm/px · z∈[-60,+98]mm · 4 of 56 slices shown]
[im 1/56]
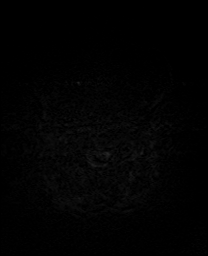
[im 19/56]
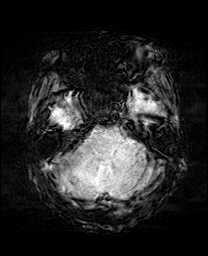
[im 37/56]
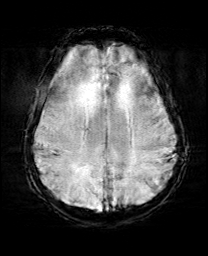
[im 56/56]
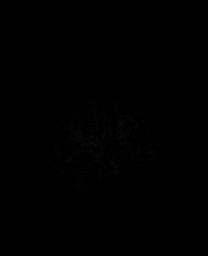

[Series 16: mip_images(sw) · axial · 24.0mm · 0.90mm/px · z∈[-50,+88]mm · 4 of 49 slices shown]
[im 1/49]
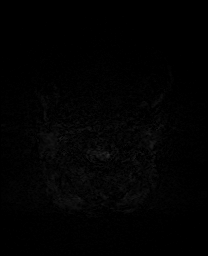
[im 17/49]
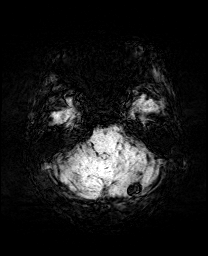
[im 33/49]
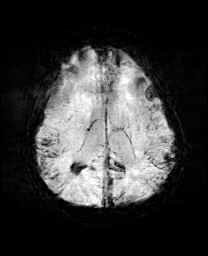
[im 49/49]
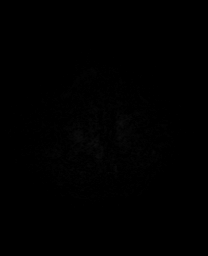

[Series 17: T1 · sagittal · 5.0mm · 0.75mm/px · 2 of 25 slices shown]
[im 1/25]
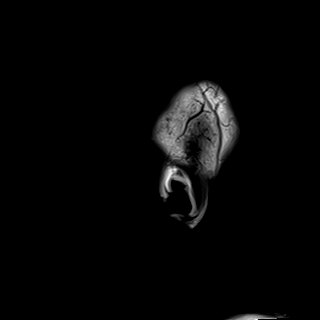
[im 25/25]
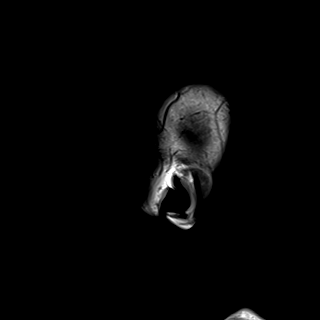

[Series 18: T2 · axial · 5.0mm · 0.72mm/px · z∈[-47,+91]mm · 2 of 25 slices shown (1 of 2)]
[im 1/25]
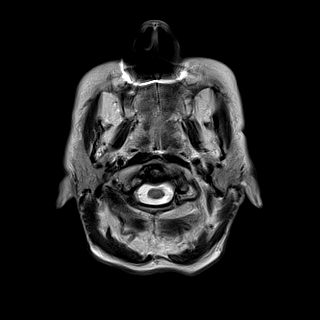
[im 25/25]
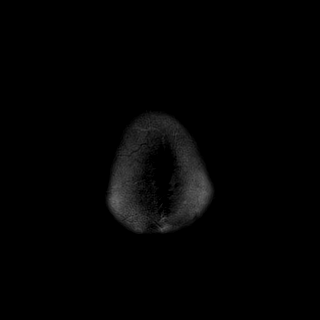

[Series 19: FLAIR · axial · 5.0mm · 0.90mm/px · z∈[-47,+91]mm · 2 of 25 slices shown]
[im 1/25]
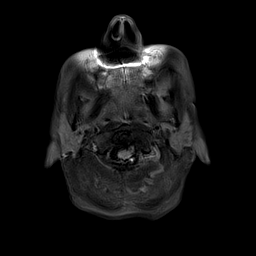
[im 25/25]
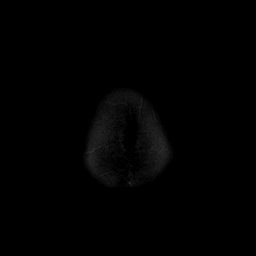

[Series 21: T2 · coronal · 5.0mm · 0.72mm/px · 2 of 30 slices shown (2 of 2)]
[im 1/30]
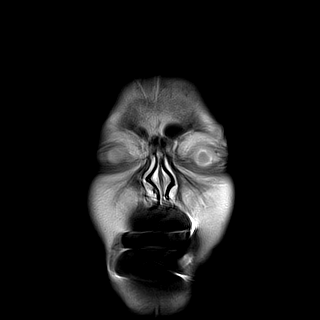
[im 30/30]
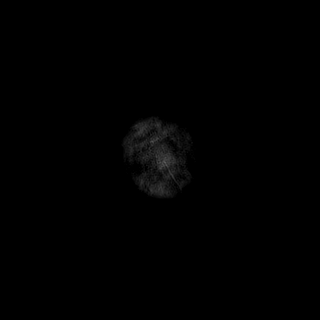

[Series 22: ax hemo · axial · 5.0mm · 0.86mm/px · z∈[-49,+89]mm · 2 of 25 slices shown]
[im 1/25]
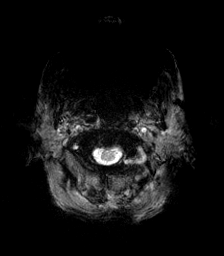
[im 25/25]
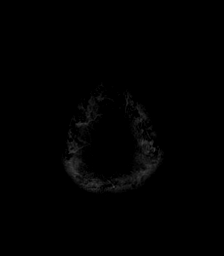

[44 of 48 positions shown; findings below may reference images not displayed]

FINDINGS: MRI HEAD FINDINGS

Brain: Generalized age-related cerebral atrophy with chronic
microvascular ischemic disease. Small remote lacunar infarct at the
left caudate head.

Extensive FLAIR signal intensity seen layering throughout multiple
cortical sulci, consistent with known subarachnoid hemorrhage.
Additional subarachnoid blood seen within the basilar cisterns,
predominantly localized about the coiled right PCOM aneurysm.
Evidence for redistribution with intraventricular hemorrhage
layering primarily within the occipital horns both lateral
ventricles. Associated hydrocephalus with evidence for
transependymal flow of CSF, relatively stable from previous.

At small 1.1 cm curvilinear cortical infarct noted at the high
posterior right parietal lobe (series 11, image 87). Additional
small volume patchy diffusion abnormality involves the central
aspect of the splenium, also suspicious for acute ischemia (series
11, image 77, 78). No associated hemorrhage or mass effect.
Otherwise, no other convincing diffusion abnormality to suggest
acute or subacute ischemia. Gray-white matter differentiation
maintained. No encephalomalacia to suggest chronic cortical
infarction elsewhere within the brain.

No mass lesion, midline shift, or mass effect. Pituitary gland
within normal limits. Midline structures intact.

Vascular: Sequelae of prior coil embolization of right PCOM
aneurysm. Major intracranial vascular flow voids maintained.

Skull and upper cervical spine: Craniocervical junction within
normal limits. Upper cervical spine normal. Bone marrow signal
intensity normal. No focal marrow replacing lesion. No scalp soft
tissue abnormality.

Sinuses/Orbits: Globes and orbital soft tissues within normal
limits. Scattered mucosal thickening noted within the paranasal
sinuses. Nasogastric tube in place. Trace bilateral mastoid
effusions noted.

Other: None.

MRA HEAD FINDINGS

ANTERIOR CIRCULATION:

Examination moderately degraded by motion artifact.

Visualized distal cervical segments of the internal carotid arteries
are patent with antegrade flow. Petrous, cavernous, and supraclinoid
segments of both internal carotid arteries patent without stenosis.
Susceptibility artifact related to recently coiled right PCOM
aneurysm. Small irregular neck remnant measuring up to 4 mm is seen
partially filling the aneurysm sac (series 7, image 121). A1
segments patent. Grossly normal anterior communicating artery
complex. Distal ACAs not well assessed due to motion, but are
grossly patent and perfused. M1 segments remain patent. Normal MCA
bifurcations. Distal MCA branches perfused and fairly symmetric. No
overt evidence for significant vasospasm.

POSTERIOR CIRCULATION:

Dominant left vertebral artery widely patent to the vertebrobasilar
junction. Right vertebral artery diminutive and largely terminates
in PICA. Both PICA patent. Basilar patent to its distal aspect
without stenosis. Superior cerebellar arteries grossly patent
proximally. Basilar tip ectatic without frank aneurysm. Both PCAs
primarily supplied via the basilar and appears somewhat small, but
appear patent and perfused to their distal aspects. No evidence for
significant vasospasm.
IMPRESSION: MRI HEAD IMPRESSION:

1. Sequelae of recently ruptured right PCOM aneurysm with extensive
subarachnoid hemorrhage and evidence for redistribution. Associated
hydrocephalus with transependymal flow of CSF. Overall, appearance
is stable from previous.
2. Few small ischemic infarcts involving the high posterior right
parietal cortex and likely the splenium as above. No other evidence
for acute ischemia. Previously questioned left-sided infarcts on
prior head CT or most likely artifactual on that exam.

MRA HEAD IMPRESSION:

1. Motion degraded exam.
2. Sequelae of recently coiled right PCOM aneurysm. Persistent
irregular 4 mm neck remnant partially fills the aneurysm sac.
3. Otherwise negative and stable intracranial MRA. No other large
vessel occlusion or hemodynamically significant stenosis. No overt
evidence for significant vasospasm on this motion degraded exam.

## 2020-05-28 MED ORDER — CARVEDILOL 3.125 MG PO TABS
6.2500 mg | ORAL_TABLET | Freq: Two times a day (BID) | ORAL | Status: DC
  Administered 2020-05-28 – 2020-05-31 (×7): 6.25 mg
  Filled 2020-05-28: qty 1
  Filled 2020-05-28 (×6): qty 2

## 2020-05-28 MED ORDER — MIRTAZAPINE 15 MG PO TABS
7.5000 mg | ORAL_TABLET | Freq: Every day | ORAL | Status: DC
  Administered 2020-05-28 – 2020-05-30 (×3): 7.5 mg
  Filled 2020-05-28 (×3): qty 1

## 2020-05-28 MED ORDER — MIRTAZAPINE 15 MG PO TABS: 7.5000 mg | ORAL_TABLET | Freq: Every day | ORAL | Status: DC

## 2020-05-28 MED ORDER — POLYETHYLENE GLYCOL 3350 17 G PO PACK: 17.0000 g | PACK | Freq: Every day | ORAL | Status: DC | PRN

## 2020-05-28 NOTE — Progress Notes (Signed)
Occupational Therapy Treatment Patient Details Name: Alexandra Mays MRN: [id number] DOB: 1950-09-19 Today's Date: 05/28/2020    History of present illness 70 y.o. female who presented 05/20/20 with R neck pain and a headache. 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 Pt recently discharged from hospital 05/16/20 with NSTEMI. 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 PMH: dyspnea, COPD, AAA, pure hyperglyceridemia.   OT comments  Pt with pending CT scan results. RN agreeable to OOB with OT prior to session. Pt more alert and engaged this session than previous sessions. Pt progressed from bed to bsc and then chair. Pt talking and visually attending. Pt provided glasses during session. Recommendation for CIR at this tie.   Follow Up Recommendations  CIR    Equipment Recommendations  3 in 1 bedside commode    Recommendations for Other Services Rehab consult    Precautions / Restrictions Precautions Precautions: Fall Precaution Comments: NGT, mittens Restrictions Weight Bearing Restrictions: No       Mobility Bed Mobility Overal bed mobility: Needs Assistance Bed Mobility: Rolling;Supine to Sit Rolling: Mod assist   Supine to sit: Mod assist     General bed mobility comments: pt requires (A) to initiate and to elevate trunk from bed surface    Transfers Overall transfer level: Needs assistance Equipment used: Rolling walker (2 wheeled) Transfers: Sit to/from Stand Sit to Stand: Min assist         General transfer comment: pt with cues for safety and help to facilitate the rw    Balance Overall balance assessment: Needs assistance Sitting-balance support: Bilateral upper extremity supported;Feet supported Sitting balance-Leahy Scale: Fair     Standing balance support: Bilateral upper extremity supported;During functional  activity Standing balance-Leahy Scale: Poor Standing balance comment: reliance on rw                           ADL either performed or assessed with clinical judgement   ADL Overall ADL's : Needs assistance/impaired Eating/Feeding: NPO   Grooming: Wash/dry face;Minimal assistance;Supervision/safety Grooming Details (indicate cue type and reason): pt states 'that feels good"                 Toilet Transfer: Minimal Theatre stage manager Details (indicate cue type and reason): pt using walker to transition to bsc. pt reports need to void but does not void at this tme Toileting- Water quality scientist and Hygiene: Total assistance Toileting - Clothing Manipulation Details (indicate cue type and reason): doff and don mesh underwear       General ADL Comments: pt progressed from bedside to chair. pt completed sit<>stand xt5 this session     Vision   Additional Comments: wearing glasses this session with OT   Perception     Praxis      Cognition Arousal/Alertness: Awake/alert   Overall Cognitive Status: History of cognitive impairments - at baseline Area of Impairment: Following commands                       Following Commands: Follows one step commands with increased time       General Comments: pt more alert and following commands more brisk than previous sessions. pt moaning during session and denies pain. pt just moans at times but states "oh that is better" when allowed oob to chair  Exercises     Shoulder Instructions       General Comments risk for sking break down due to decreased mobility pt with pillows placed for chair positioning    Pertinent Vitals/ Pain       Pain Assessment: No/denies pain (states NO when asked if she has pain) Pain Descriptors / Indicators: Moaning  Home Living                                          Prior Functioning/Environment              Frequency  Min  2X/week        Progress Toward Goals  OT Goals(current goals can now be found in the care plan section)  Progress towards OT goals: Progressing toward goals  Acute Rehab OT Goals Patient Stated Goal: to improve OT Goal Formulation: Patient unable to participate in goal setting Potential to Achieve Goals: Fair ADL Goals Pt Will Perform Grooming: with mod assist;bed level (met) Pt Will Transfer to Toilet: with mod assist;stand pivot transfer;bedside commode Additional ADL Goal #1: pt will follow 1 step command 75% of session (met goal upgraded) Additional ADL Goal #2: Pt will complete bed mobility min (A) as precursor to adls. (met upgraded)  Plan Discharge plan remains appropriate    Co-evaluation                 AM-PAC OT "6 Clicks" Daily Activity     Outcome Measure   Help from another person eating meals?: A Lot Help from another person taking care of personal grooming?: A Lot Help from another person toileting, which includes using toliet, bedpan, or urinal?: Total Help from another person bathing (including washing, rinsing, drying)?: Total Help from another person to put on and taking off regular upper body clothing?: Total Help from another person to put on and taking off regular lower body clothing?: Total 6 Click Score: 8    End of Session Equipment Utilized During Treatment: Gait belt;Rolling walker  OT Visit Diagnosis: Unsteadiness on feet (R26.81);Muscle weakness (generalized) (M62.81)   Activity Tolerance Patient tolerated treatment well   Patient Left in chair;with chair alarm set;with restraints reapplied;with call bell/phone within reach   Nurse Communication Mobility status;Precautions        Time: 7939-0300 OT Time Calculation (min): 15 min  Charges: OT General Charges $OT Visit: 1 Visit OT Treatments $Self Care/Home Management : 8-22 mins   Brynn, OTR/L  Acute Rehabilitation Services Pager: [phone] Office:  [phone] .    Jeri Modena 05/28/2020, 9:25 PM

## 2020-05-28 NOTE — Progress Notes (Signed)
Transcranial Doppler  Date POD PCO2 HCT BP  MCA ACA PCA OPHT SIPH VERT Basilar       Right  Left                                       3/14 MR     Right  Left   55  41   -52  -33   38  29   21  17   17  24    -23  -27   -24      3/16 GC     Right  Left   72  38   -18  -42   40  24   20  16   30  24    -20  -25   -20      3/18 MR      Right  Left   *  34   *  -39   21  21   25  25    40  *   -26  -26   -31            Right  Left                                            Right  Left                                            Right  Left                                        MCA = Middle Cerebral Artery      OPHT = Opthalmic Artery     BASILAR = Basilar Artery   ACA = Anterior Cerebral Artery     SIPH = Carotid Siphon PCA = Posterior Cerebral Artery   VERT = Verterbral Artery                   Normal MCA = 62+\-12 ACA = 50+\-12 PCA = 42+\-23   05/28/2020 1:03 PM

## 2020-05-28 NOTE — Progress Notes (Signed)
IP rehab admissions - Noted patient may be able to transfer to step down unit today.  We will follow progress and check next week for timing and medical readiness for potential acute inpatient rehab admission.  Call for questions.  [phone]

## 2020-05-28 NOTE — Plan of Care (Signed)
  Problem: Education: Goal: Knowledge of disease or condition will improve Outcome: Progressing Goal: Knowledge of secondary prevention will improve Outcome: Progressing Goal: Knowledge of patient specific risk factors addressed and post discharge goals established will improve Outcome: Progressing Goal: Individualized Educational Video(s) Outcome: Progressing   Problem: Coping: Goal: Will verbalize positive feelings about self Outcome: Progressing Goal: Will identify appropriate support needs Outcome: Progressing   Problem: Health Behavior/Discharge Planning: Goal: Ability to manage health-related needs will improve Outcome: Progressing   Problem: Self-Care: Goal: Ability to participate in self-care as condition permits will improve Outcome: Progressing Goal: Verbalization of feelings and concerns over difficulty with self-care will improve Outcome: Progressing Goal: Ability to communicate needs accurately will improve Outcome: Progressing   Problem: Nutrition: Goal: Dietary intake will improve Outcome: Progressing   Problem: Spontaneous Subarachnoid Hemorrhage Tissue Perfusion: Goal: Complications of Spontaneous Subarachnoid Hemorrhage will be minimized Outcome: Progressing   Problem: Safety: Goal: Non-violent Restraint(s) Outcome: Progressing

## 2020-05-28 NOTE — Progress Notes (Addendum)
NAME:  Alexandra Mays, MRN:  [id number], DOB:  1950-12-28, LOS: 8 ADMISSION DATE:  05/20/2020, CONSULTATION DATE: 05/21/2020 REFERRING MD: Dr. Conchita Paris, CHIEF COMPLAINT: Headache and neck pain  Brief History:  70 year old female with COPD and recent NSTEMI who presented with sudden onset of headache and neck pain for few days, noted to have subarachnoid hemorrhage H/H 2, MF 3 due to ruptured right P-comm aneurysm, status post coiling  Past Medical History:   Past Medical History:  Diagnosis Date  . AAA (abdominal aortic aneurysm) (HCC)    AAA  . Abdominal aortic aneurysm (AAA) without rupture (HCC) 04/21/2015  . Acute respiratory failure with hypoxia (HCC) 05/14/2020  . Cigarette smoker 03/29/2015  . COPD (chronic obstructive pulmonary disease) (HCC)   . 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 Depressive disorder 02/17/2015  . Dyslipidemia 03/29/2015  . Dyspnea    with exertion  . Essential hypertension 05/14/2020  . Hyperlipemia 05/14/2020  . Leukocytosis 05/14/2020  . Mild cognitive impairment with memory loss 02/17/2015  . NSTEMI (non-ST elevated myocardial infarction) (HCC) 05/13/2020  . Protein-calorie malnutrition, severe 05/25/2020  . Pure hyperglyceridemia   . S/P aortic aneurysm repair 07/24/2016  . Stress-induced cardiomyopathy   . Subarachnoid hemorrhage due to cerebral aneurysm (HCC) 05/20/2020  . Tobacco abuse 05/14/2020     Significant Hospital Events:    Consults:  Neurosurgery PCCM  Procedures:  3/10 angiogram with coiling of right P-comm aneurysm  Significant Diagnostic Tests:  3/10 CT head: Subarachnoid hemorrhage 3/10 CT angiogram of head and neck: Ruptured right P-comm aneurysm 3/11 CT head: Diffuse areas of high attenuation throughout the bilateral sulci, likely a combination of residual subarachnoid hemorrhage and postprocedural contrast staining. 2. Persistent subarachnoid hemorrhage in the suprasellar region, with intraventricular  hemorrhage in the bilateral lateral ventricles and third ventricles as above. 3. Mild hydrocephalus which has developed in the interim. 4. Developing hypodensities within the right insula and frontotemporal regions, likely representing acute cortical infarct. 5. Diffuse hypodensities throughout the periventricular white matter, likely representing an element of transependymal CSF flow and chronic small vessel ischemic change  Micro Data:  3/10 MRSA PCR negative 3/10 Covid and influenza PCR negative  Antimicrobials:     Interim History / Subjective:   Slept throughout the night. Sleepy this morning, snoring during exam. Denies headache or neck pain this morning. Short responses to questions. Afebrile, normal respiratory effort, oxygenating well on RA  Objective   Blood pressure (!) 152/66, pulse 72, temperature 98.2 F (36.8 C), temperature source Oral, resp. rate (!) 22, height 5\' 4"  (1.626 m), weight 38.3 kg, SpO2 98 %.        Intake/Output Summary (Last 24 hours) at 05/28/2020 0859 Last data filed at 05/28/2020 0800 Gross per 24 hour  Intake 1914.73 ml  Output 950 ml  Net 964.73 ml   Filed Weights   05/20/20 0920 05/26/20 0458 05/28/20 0400  Weight: 37.8 kg 41.8 kg 38.3 kg    Examination: General: Cachectic, more somnolent this morning, briefly arousable to voice, chronically ill appearing elderly female lying comfortably in bed, NAD HEENT: Dry mucous membranes, Cortrak in place Neuro: somnolent but briefly arousable to voice, EOMI, alert and oriented to person, able to lift arms and legs on command, responds with single word answers Chest: intermittent cough, faint expiratory ronchi bilaterally, no accessory muscle use Heart: RRR, normal S1S2, no MRG Extremities: No peripheral edema, 1+ bilateral radial pulses Abdomen: Soft, nontender, nondistended, positive bowel sounds   Resolved Hospital  Problem list     Assessment & Plan:  This is a 70 yo woman with  aneurysmal SAH s/p coiling who is 9 days post-bleed.  Aneurysmal subarachnoid hemorrhage H/H 2, MF 3 due to ruptured P-comm aneurysm status post coiling -more somnolent this morning, question from overnight sleep aids vs hydrocephalus vs vasospasm, reassess in afternoon after TCD -if normal TCD today and improved mental status will transfer to floor -Keppra d/c'd -cont. Nimodipine q4h -maintain sys BP 120-160 -elevate HOB to 30 degrees -q4h neuro checks  Mild to moderate hydrocephalus -more somnolent this morning, repeat CT this afternoon if no improvement  ICU Delirium -slept well throughout the night, somnolent this morning -start Mirtazapine 7.5 mg QHS, additional benefit of possible weight gain -cont Melatonin 3mg  QHS -d/c Seroquel -Urecholine and Keppra d/c'd -delirium precautions - frequent orientation, scheduled meals, reduce tubing and wires present on patient as able to, pain control  Ischemic chronic systolic congestive heart failure, EF 20 to 25% -cont coreg -restart losartan once nimodipine is d/c'd -cont simvastatin -clinically remains euvolemic -strict I&O  Malnutrition Pt is cachectic on exam, clear muscle wasting with prominence of the clavicles and atrophic quadriceps -abdominal x-ray normal -Mirtazapine 7.5 mg (for insomnia as above) -Cortrak with nocturnal tube feeds -follow mag, phos, K for refeeding -inpatient endoscopy prior to discharge -cont. famotidine  Hyponatremia -Na 130 -encourage PO intake today -restart low rate NS if no improvement in AM -f/u BMP in AM  COPD, not in exacerbation -cont. Breo Ellipta, Incruse Ellipta QD -cont albuterol PRN  Diarrhea -fiber supplement -stool softeners PRN  Dementia -not on medication at home -cont. supportive care - frequent orientation, daytime meals, pain mangement, melatonin QHS  Best practice (evaluated daily)  Diet: Tube feeds Pain/Anxiety/Delirium protocol (if indicated): Tylenol PRN VAP  protocol (if indicated): N/A DVT prophylaxis: SCDs GI prophylaxis: Famotidine Glucose control: SSI Mobility: As tolerated Disposition: Full code  Goals of Care:  Last date of multidisciplinary goals of care discussion: Per primary team Code Status: Full code  Labs   CBC: Recent Labs  Lab 05/23/20 0428 05/28/20 0220  WBC 12.8* 12.9*  HGB 12.9 12.4  HCT 36.6 36.2  MCV 88.0 90.3  PLT 205 203    Basic Metabolic Panel: Recent Labs  Lab 05/23/20 0428 05/24/20 1256 05/24/20 1809 05/25/20 0720 05/25/20 1644 05/26/20 0834 05/27/20 0306 05/28/20 0220  NA 127*  --  131*  --   --  135 136 130*  K 2.9*  --  3.7  --   --  3.0* 3.8 3.7  CL 96*  --  106  --   --  106 109 101  CO2 21*  --  19*  --   --  21* 19* 21*  GLUCOSE 90  --  98  --   --  142* 110* 124*  BUN 14  --  19  --   --  12 12 12   CREATININE 0.55  --  0.63  --   --  0.59 0.46 0.46  CALCIUM 8.5*  --  8.0*  --   --  8.3* 8.2* 8.1*  MG 1.8   < > 1.9 2.0 1.8 1.7 2.1 1.8  PHOS 2.2*   < > 2.4* 2.1* 2.9 2.6 2.7  --    < > = values in this interval not displayed.   GFR: Estimated Creatinine Clearance: 39 mL/min (by C-G formula based on SCr of 0.46 mg/dL). Recent Labs  Lab 05/23/20 0428 05/28/20 0220  WBC 12.8*  12.9*    Liver Function Tests: No results for input(s): AST, ALT, ALKPHOS, BILITOT, PROT, ALBUMIN in the last 168 hours. No results for input(s): LIPASE, AMYLASE in the last 168 hours. No results for input(s): AMMONIA in the last 168 hours.  ABG    Component Value Date/Time   PHART 7.412 05/14/2020 1347   PCO2ART 36.8 05/14/2020 1347   PO2ART 82 (L) 05/14/2020 1347   HCO3 23.7 05/14/2020 1354   TCO2 26 05/20/2020 1055   ACIDBASEDEF 1.0 05/14/2020 1354   O2SAT 67.0 05/14/2020 1354     Coagulation Profile: No results for input(s): INR, PROTIME in the last 168 hours.  Cardiac Enzymes: No results for input(s): CKTOTAL, CKMB, CKMBINDEX, TROPONINI in the last 168 hours.  HbA1C: Hgb A1c MFr Bld   Date/Time Value Ref Range Status  05/14/2020 02:40 AM 5.8 (H) 4.8 - 5.6 % Final    Comment:    (NOTE) Pre diabetes:          5.7%-6.4%  Diabetes:              >6.4%  Glycemic control for   <7.0% adults with diabetes     CBG: Recent Labs  Lab 05/27/20 1516 05/27/20 1911 05/27/20 2333 05/28/20 0327 05/28/20 0739  GLUCAP 126* 141* 142* 126* 126*     Past Medical History:  She,  has a past medical history of AAA (abdominal aortic aneurysm) (HCC), Abdominal aortic aneurysm (AAA) without rupture (HCC) (04/21/2015), Acute respiratory failure with hypoxia (HCC) (05/14/2020), Cigarette smoker (03/29/2015), COPD (chronic obstructive pulmonary disease) (HCC), 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   Surgical History:   Past Surgical History:  Procedure Laterality Date  . ABDOMINAL AORTIC ANEURYSM REPAIR N/A 07/24/2016   Procedure: ABDOMINAL AORTIC ANEURYSM  REPAIR;  Surgeon: Sherren KernsFields, Charles E, MD;  [facility];  Service: Vascular;  Laterality: N/A;  . cardiac stenting  2007  . ENDARTERECTOMY Left 07/24/2016   Procedure: AORTA TO LEFT ILIAC ENDARTERECTOMY;  Surgeon: Sherren KernsFields, Charles E, MD;  [facility];  Service: Vascular;  Laterality: Left;  . ENDARTERECTOMY FEMORAL Right 07/24/2016   Procedure: AORTA TO RIGHT FEMORAL ENDARTERECTOMY;  Surgeon: Sherren KernsFields, Charles E, MD;  Location: [facility] OR;  Service: Vascular;  Laterality: Right;  . IR ANGIO INTRA EXTRACRAN SEL  INTERNAL CAROTID BILAT MOD SED  05/20/2020  . IR ANGIO VERTEBRAL SEL VERTEBRAL UNI R MOD SED  05/20/2020  . IR ANGIOGRAM FOLLOW UP STUDY  05/20/2020  . IR ANGIOGRAM FOLLOW UP STUDY  05/20/2020  . IR CT HEAD LTD  05/20/2020  . IR TRANSCATH/EMBOLIZ  05/20/2020  . RADIOLOGY WITH ANESTHESIA N/A 05/20/2020   Procedure: IR WITH ANESTHESIA - ANEURYSM;  Surgeon: Lisbeth RenshawNundkumar, Neelesh, MD;  [facility];  Service: Radiology;  Laterality: N/A;  . RIGHT/LEFT HEART CATH AND CORONARY ANGIOGRAPHY N/A 05/14/2020   Procedure: RIGHT/LEFT HEART CATH AND CORONARY ANGIOGRAPHY;  Surgeon: Tonny Bollmanooper, Michael, MD;  [facility];  Service: Cardiovascular;  Laterality: N/A;  . TOE SURGERY Left    3 rd toe Hammer toe     Social History:   reports that she has been smoking cigarettes. She has a 30.00 pack-year smoking history. She  has never used smokeless tobacco. She reports that she does not drink alcohol and does not use drugs.   Family History:  Her family history includes Stroke in her mother.   Allergies Allergies  Allergen Reactions  . No Known Allergies      Home Medications  Prior to Admission medications   Medication Sig Start Date End Date Taking? Authorizing Provider  albuterol (PROVENTIL HFA;VENTOLIN HFA) 108 (90 Base) MCG/ACT inhaler Inhale 2 puffs into the lungs every 6 (six) hours as needed for wheezing or shortness of breath.   Yes [provider]  albuterol (PROVENTIL) (2.5 MG/3ML) 0.083% nebulizer solution Take 3 mLs (2.5 mg total) by nebulization every 6 (six) hours as needed for wheezing or shortness of breath. 11/21/19  Yes Hunsucker, Lesia Sago, MD  BREO ELLIPTA 100-25 MCG/INH AEPB Inhale 1 puff into the lungs daily. 04/16/20  Yes [provider]  carvedilol (COREG) 3.125 MG tablet Take 1 tablet (3.125 mg total) by mouth 2 (two) times daily with a meal. 05/16/20  Yes Zannie Cove, MD  ibuprofen (ADVIL,MOTRIN) 200 MG tablet Take 800 mg by mouth every 6 (six) hours as  needed for mild pain.   Yes [provider]  losartan (COZAAR) 25 MG tablet Take 0.5 tablets (12.5 mg total) by mouth daily. 05/16/20  Yes Zannie Cove, MD  Multiple Vitamin (MULTIVITAMIN WITH MINERALS) TABS tablet Take 1 tablet by mouth daily.   Yes [provider]  simvastatin (ZOCOR) 20 MG tablet Take 20 mg by mouth every morning.    Yes [provider]  Tiotropium Bromide Monohydrate 2.5 MCG/ACT AERS Inhale 2 puffs into the lungs daily.   Yes [provider]  polyethylene glycol (MIRALAX / GLYCOLAX) packet Take 17 g by mouth daily. Patient not taking: No sig reported 06/23/16   Forest Becker, MD   Jori Moll, Medical Student 05/28/2020 , 8:59 AM

## 2020-05-28 NOTE — Progress Notes (Addendum)
Neurosurgery Service Progress Note  Subjective: No acute events overnight, no complaints today   Objective: Vitals:   05/28/20 0400 05/28/20 0500 05/28/20 0600 05/28/20 0700  BP: (!) 154/75 (!) 142/71 (!) 152/78 (!) 155/70  Pulse: 70 67 (!) 46 70  Resp: 18 19 19  (!) 22  Temp: 99 F (37.2 C)     TempSrc: Oral     SpO2: 98% 98% 98% 98%  Weight: 38.3 kg     Height:        Physical Exam: Somnolent but Ox2, PERRL, EOMI, FS, TM, Strength 5/5 x4, SILTx4  Assessment & Plan: 70 y.o. woman w/ aSAH PBD9 s/p R PComm coiling, recovering well.  -continue nimodipine, levetiracetam -okay with transferring to stepdown if TCDs are stable today -okay with restarting CHF Rx per CCM recs -d/c keppra to minimize sedation  62  05/28/20 7:56 AM

## 2020-05-28 NOTE — Progress Notes (Signed)
Notes reviewed, CTH reviewed, became more somnolent this morning after my rounds, TCDs still double digits, Rpt CTH shows some scattered L MCA hypodensities and possible medullary hypodensity, ventricles still increased in size compared to on arrival CTA. Pt now back to baseline.  -MRI / MRA to evaluate for timing of infarcts, TCDs may be unreliable, MRA will obviously over-estimate spasm / stenosis -keep in ICU -given CHF, unless ischemia is acute then will just continue to watch closely in the unit

## 2020-05-29 DIAGNOSIS — I255 Ischemic cardiomyopathy: Secondary | ICD-10-CM | POA: Diagnosis not present

## 2020-05-29 LAB — GLUCOSE, CAPILLARY
Glucose-Capillary: 120 mg/dL — ABNORMAL HIGH (ref 70–99)
Glucose-Capillary: 141 mg/dL — ABNORMAL HIGH (ref 70–99)
Glucose-Capillary: 135 mg/dL — ABNORMAL HIGH (ref 70–99)
Glucose-Capillary: 136 mg/dL — ABNORMAL HIGH (ref 70–99)
Glucose-Capillary: 135 mg/dL — ABNORMAL HIGH (ref 70–99)
Glucose-Capillary: 139 mg/dL — ABNORMAL HIGH (ref 70–99)

## 2020-05-29 LAB — BASIC METABOLIC PANEL
Anion gap: 8 (ref 5–15)
BUN: 17 mg/dL (ref 8–23)
CO2: 24 mmol/L (ref 22–32)
Calcium: 8.2 mg/dL — ABNORMAL LOW (ref 8.9–10.3)
Chloride: 97 mmol/L — ABNORMAL LOW (ref 98–111)
Creatinine, Ser: 0.47 mg/dL (ref 0.44–1.00)
GFR, Estimated: >60 mL/min (ref >60)
Glucose, Bld: 137 mg/dL — ABNORMAL HIGH (ref 70–99)
Potassium: 3.5 mmol/L (ref 3.5–5.1)
Sodium: 129 mmol/L — ABNORMAL LOW (ref 135–145)

## 2020-05-29 MED ORDER — NIMODIPINE 30 MG PO CAPS: 30.0000 mg | ORAL_CAPSULE | ORAL | Status: DC

## 2020-05-29 MED ORDER — NIMODIPINE 30 MG PO CAPS
60.0000 mg | ORAL_CAPSULE | ORAL | Status: DC
  Administered 2020-05-30 – 2020-05-31 (×3): 60 mg via ORAL
  Filled 2020-05-29 (×7): qty 2

## 2020-05-29 MED ORDER — NIMODIPINE 6 MG/ML PO SOLN
60.0000 mg | ORAL | Status: DC
  Administered 2020-05-29 – 2020-06-04 (×29): 60 mg
  Filled 2020-05-29 (×37): qty 10

## 2020-05-29 MED ORDER — NIMODIPINE 6 MG/ML PO SOLN
30.0000 mg | ORAL | Status: DC
  Filled 2020-05-29: qty 10

## 2020-05-29 NOTE — Progress Notes (Signed)
Patient ID: Alexandra Mays, female   DOB: June 21, 1950, 70 y.o.   MRN: [id number] BP (!) 146/78 (BP Location: Left Arm)   Pulse 71   Temp 99.6 F (37.6 C) (Axillary)   Resp (!) 21   Ht 5\' 4"  (1.626 m)   Wt 38.3 kg   SpO2 95%   BMI 14.49 kg/m  Lethargic, following commands, moving all extremities Mri fairly unremarkable, small right cortical infarct. Other small ischemic areas but nothing unexpected given sah.  Will transfer today.

## 2020-05-29 NOTE — Progress Notes (Addendum)
NAME:  Alexandra Mays, MRN:  [id number], DOB:  July 10, 1950, LOS: 9 ADMISSION DATE:  05/20/2020, CONSULTATION DATE: 05/21/2020 REFERRING MD: Dr. Conchita Paris, CHIEF COMPLAINT: Headache and neck pain  Brief History:  70 year old female with COPD and recent NSTEMI who presented with sudden onset of headache and neck pain for few days, noted to have subarachnoid hemorrhage H/H 2, MF 3 due to ruptured right P-comm aneurysm, status post coiling  Past Medical History:   Past Medical History:  Diagnosis Date  . AAA (abdominal aortic aneurysm) (HCC)    AAA  . Abdominal aortic aneurysm (AAA) without rupture (HCC) 04/21/2015  . Acute respiratory failure with hypoxia (HCC) 05/14/2020  . Cigarette smoker 03/29/2015  . COPD (chronic obstructive pulmonary disease) (HCC)   . 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 Depressive disorder 02/17/2015  . Dyslipidemia 03/29/2015  . Dyspnea    with exertion  . Essential hypertension 05/14/2020  . Hyperlipemia 05/14/2020  . Leukocytosis 05/14/2020  . Mild cognitive impairment with memory loss 02/17/2015  . NSTEMI (non-ST elevated myocardial infarction) (HCC) 05/13/2020  . Protein-calorie malnutrition, severe 05/25/2020  . Pure hyperglyceridemia   . S/P aortic aneurysm repair 07/24/2016  . Stress-induced cardiomyopathy   . Subarachnoid hemorrhage due to cerebral aneurysm (HCC) 05/20/2020  . Tobacco abuse 05/14/2020    Significant Hospital Events:   Has remained in ICU on vasospasm watch.  TCD's have been normal although patient continues to be fairly somnolent. Follow-up CT scan 3/18 and MRI small age-indeterminate strokes but no convincing evidence of vasospasm  Interim History / Subjective:   Mental status remains unchanged.  She continues to be fairly somnolent throughout the day but will periodically wake up speak and follow commands and is tolerating her culture daily  Objective   Blood pressure (!) 146/78, pulse 71, temperature 99.6 F (37.6  C), temperature source Axillary, resp. rate (!) 21, height 5\' 4"  (1.626 m), weight 38.3 kg, SpO2 95 %.        Intake/Output Summary (Last 24 hours) at 05/29/2020 1305 Last data filed at 05/29/2020 1200 Gross per 24 hour  Intake 1540 ml  Output 875 ml  Net 665 ml   Filed Weights   05/20/20 0920 05/26/20 0458 05/28/20 0400  Weight: 37.8 kg 41.8 kg 38.3 kg    Examination: General: Cachectic, more somnolent this morning, briefly arousable to voice, chronically ill appearing elderly female lying comfortably in bed, NAD HEENT: Dry mucous membranes, Cortrak in place Neuro: somnolent but briefly arousable to voice, EOMI, alert and oriented to person, able to lift arms and legs on command, responds with single word answers Chest: Chest clear. Heart: RRR, normal S1S2, no MRG, no JVD, no edema. Extremities: No peripheral edema, 1+ bilateral radial pulses Abdomen: Soft, nontender, nondistended, positive bowel sounds   Resolved Hospital Problem list     Assessment & Plan:  This is a 70 yo woman with aneurysmal SAH s/p coiling who is 9 days post-bleed.  Aneurysmal subarachnoid hemorrhage H/H 2, MF 3 due to ruptured P-comm aneurysm status post coiling -more somnolent this morning, question from overnight sleep aids vs hydrocephalus vs vasospasm, reassess in afternoon after TCD -if normal TCD today and improved mental status will transfer to floor -Keppra d/c'd -cont. Nimodipine q4h -maintain sys BP 120-160 -elevate HOB to 30 degrees -q4h neuro checks  Mild to moderate hydrocephalus -more somnolent this morning, repeat CT this afternoon if no improvement  ICU Delirium -slept well throughout the night, somnolent this morning -start  Mirtazapine 7.5 mg QHS, additional benefit of possible weight gain -cont Melatonin 3mg  QHS -d/c Seroquel -Urecholine and Keppra d/c'd -delirium precautions - frequent orientation, scheduled meals, reduce tubing and wires present on patient as able to, pain  control  Ischemic chronic systolic congestive heart failure, EF 20 to 25% -cont coreg -restart losartan once nimodipine is d/c'd -cont simvastatin -clinically remains euvolemic -strict I&O  Malnutrition Pt is cachectic on exam, clear muscle wasting with prominence of the clavicles and atrophic quadriceps -abdominal x-ray normal -Mirtazapine 7.5 mg (for insomnia as above) -Cortrak with nocturnal tube feeds -follow mag, phos, K for refeeding -inpatient endoscopy prior to discharge -cont. famotidine  Hyponatremia -Na 130 -encourage PO intake today -restart low rate NS if no improvement in AM -f/u BMP in AM  COPD, not in exacerbation -cont. Breo Ellipta, Incruse Ellipta QD -cont albuterol PRN  Diarrhea -fiber supplement -stool softeners PRN  Dementia -not on medication at home -cont. supportive care - frequent orientation, daytime meals, pain mangement, melatonin QHS  Best practice (evaluated daily)  Diet: Tube feeds Pain/Anxiety/Delirium protocol (if indicated): Tylenol PRN VAP protocol (if indicated): N/A DVT prophylaxis: SCDs GI prophylaxis: Famotidine Glucose control: SSI Mobility: As tolerated Disposition: She is likely ready for transfer to PCU.  Will confirm with neurosurgery.  Goals of Care:  Last date of multidisciplinary goals of care discussion: Per primary team Code Status: Full code  Labs   CBC: Recent Labs  Lab 05/23/20 0428 05/28/20 0220  WBC 12.8* 12.9*  HGB 12.9 12.4  HCT 36.6 36.2  MCV 88.0 90.3  PLT 205 203    Basic Metabolic Panel: Recent Labs  Lab 05/24/20 1809 05/25/20 0720 05/25/20 1644 05/26/20 0834 05/27/20 0306 05/28/20 0220 05/29/20 1121  NA 131*  --   --  135 136 130* 129*  K 3.7  --   --  3.0* 3.8 3.7 3.5  CL 106  --   --  106 109 101 97*  CO2 19*  --   --  21* 19* 21* 24  GLUCOSE 98  --   --  142* 110* 124* 137*  BUN 19  --   --  12 12 12 17   CREATININE 0.63  --   --  0.59 0.46 0.46 0.47  CALCIUM 8.0*  --   --   8.3* 8.2* 8.1* 8.2*  MG 1.9 2.0 1.8 1.7 2.1 1.8  --   PHOS 2.4* 2.1* 2.9 2.6 2.7  --   --    GFR: Estimated Creatinine Clearance: 39 mL/min (by C-G formula based on SCr of 0.47 mg/dL). Recent Labs  Lab 05/23/20 0428 05/28/20 0220  WBC 12.8* 12.9*    Liver Function Tests: No results for input(s): AST, ALT, ALKPHOS, BILITOT, PROT, ALBUMIN in the last 168 hours. No results for input(s): LIPASE, AMYLASE in the last 168 hours. No results for input(s): AMMONIA in the last 168 hours.  ABG    Component Value Date/Time   PHART 7.412 05/14/2020 1347   PCO2ART 36.8 05/14/2020 1347   PO2ART 82 (L) 05/14/2020 1347   HCO3 23.7 05/14/2020 1354   TCO2 26 05/20/2020 1055   ACIDBASEDEF 1.0 05/14/2020 1354   O2SAT 67.0 05/14/2020 1354     Coagulation Profile: No results for input(s): INR, PROTIME in the last 168 hours.  Cardiac Enzymes: No results for input(s): CKTOTAL, CKMB, CKMBINDEX, TROPONINI in the last 168 hours.  HbA1C: Hgb A1c MFr Bld  Date/Time Value Ref Range Status  05/14/2020 02:40 AM 5.8 (H) 4.8 -  5.6 % Final    Comment:    (NOTE) Pre diabetes:          5.7%-6.4%  Diabetes:              >6.4%  Glycemic control for   <7.0% adults with diabetes     CBG: Recent Labs  Lab 05/28/20 1921 05/28/20 2311 05/29/20 0316 05/29/20 0753 05/29/20 1216  GLUCAP 164* 135* 120* 141* 135*    Lynnell Catalan, MD [facility] ICU Physician [facility] [place] [facility]  Pager: [phone] Or Epic Secure Chat After hours: [phone].  05/30/2020, 1:34 PM      05/29/2020 , 1:05 PM

## 2020-05-30 DIAGNOSIS — I255 Ischemic cardiomyopathy: Secondary | ICD-10-CM | POA: Diagnosis not present

## 2020-05-30 LAB — GLUCOSE, CAPILLARY
Glucose-Capillary: 123 mg/dL — ABNORMAL HIGH (ref 70–99)
Glucose-Capillary: 142 mg/dL — ABNORMAL HIGH (ref 70–99)
Glucose-Capillary: 124 mg/dL — ABNORMAL HIGH (ref 70–99)
Glucose-Capillary: 131 mg/dL — ABNORMAL HIGH (ref 70–99)
Glucose-Capillary: 151 mg/dL — ABNORMAL HIGH (ref 70–99)
Glucose-Capillary: 139 mg/dL — ABNORMAL HIGH (ref 70–99)

## 2020-05-30 MED ORDER — BETHANECHOL CHLORIDE 10 MG PO TABS
10.0000 mg | ORAL_TABLET | Freq: Three times a day (TID) | ORAL | Status: DC
  Administered 2020-05-30 – 2020-06-03 (×15): 10 mg
  Filled 2020-05-30 (×15): qty 1

## 2020-05-30 NOTE — Progress Notes (Signed)
NAME:  Alexandra Mays, MRN:  [id number], DOB:  Sep 27, 1950, LOS: 10 ADMISSION DATE:  05/20/2020, CONSULTATION DATE: 05/21/2020 REFERRING MD: Dr. Conchita Paris, CHIEF COMPLAINT: Headache and neck pain  Brief History:  70 year old female with COPD and recent NSTEMI who presented with sudden onset of headache and neck pain for few days, noted to have subarachnoid hemorrhage H/H 2, MF 3 due to ruptured right P-comm aneurysm, status post coiling  Past Medical History:   Past Medical History:  Diagnosis Date  . AAA (abdominal aortic aneurysm) (HCC)    AAA  . Abdominal aortic aneurysm (AAA) without rupture (HCC) 04/21/2015  . Acute respiratory failure with hypoxia (HCC) 05/14/2020  . Cigarette smoker 03/29/2015  . COPD (chronic obstructive pulmonary disease) (HCC)   . 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 Depressive disorder 02/17/2015  . Dyslipidemia 03/29/2015  . Dyspnea    with exertion  . Essential hypertension 05/14/2020  . Hyperlipemia 05/14/2020  . Leukocytosis 05/14/2020  . Mild cognitive impairment with memory loss 02/17/2015  . NSTEMI (non-ST elevated myocardial infarction) (HCC) 05/13/2020  . Protein-calorie malnutrition, severe 05/25/2020  . Pure hyperglyceridemia   . S/P aortic aneurysm repair 07/24/2016  . Stress-induced cardiomyopathy   . Subarachnoid hemorrhage due to cerebral aneurysm (HCC) 05/20/2020  . Tobacco abuse 05/14/2020    Significant Hospital Events:   Has remained in ICU on vasospasm watch.  TCD's have been normal although patient continues to be fairly somnolent. Follow-up CT scan 3/18 and MRI small age-indeterminate strokes but no convincing evidence of vasospasm  Interim History / Subjective:   Mental status remains unchanged.  She continues to be fairly somnolent throughout the day but will periodically wake up speak and follow commands.   Objective   Blood pressure 133/62, pulse 76, temperature 98.1 F (36.7 C), temperature source Axillary,  resp. rate (!) 21, height 5\' 4"  (1.626 m), weight 38.3 kg, SpO2 90 %.        Intake/Output Summary (Last 24 hours) at 05/30/2020 1318 Last data filed at 05/30/2020 1200 Gross per 24 hour  Intake 385 ml  Output 1300 ml  Net -915 ml   Filed Weights   05/20/20 0920 05/26/20 0458 05/28/20 0400  Weight: 37.8 kg 41.8 kg 38.3 kg    Examination: General: Cachectic, more somnolent this morning, briefly arousable to voice, chronically ill appearing elderly female lying comfortably in bed, NAD HEENT: Dry mucous membranes, Cortrak in place Neuro: somnolent but briefly arousable to voice, EOMI, alert and oriented to person, able to lift arms and legs on command, responds with single word answers Chest: Chest clear. Heart: RRR, normal S1S2, no MRG, no JVD, no edema. Extremities: No peripheral edema, 1+ bilateral radial pulses Abdomen: Soft, nontender, nondistended, positive bowel sounds   Resolved Hospital Problem list     Assessment & Plan:  This is a 70 yo woman with aneurysmal SAH s/p coiling who is 9 days post-bleed.  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 No evidence of vasospasm or hydrocephalus.  Depressed mentation secondary to bleed and dementia, will take some time to improve.  - Well compensated heart failure. Can restart losartan once of nimodipine.  Best practice (evaluated daily)  Diet: Tube feeds Pain/Anxiety/Delirium protocol (if indicated): Tylenol PRN VAP  protocol (if indicated): N/A DVT prophylaxis: SCDs GI prophylaxis: Famotidine Glucose control: SSI Mobility: As tolerated Disposition: Transfer to PCU  Goals of Care:  Last date of multidisciplinary goals of care  discussion: Per primary team Code Status: Full code,, but will likely need further goals of care discussion if mental status doesn't improve substantially by middle of next week.   Labs   CBC: Recent Labs  Lab 05/28/20 0220  WBC 12.9*  HGB 12.4  HCT 36.2  MCV 90.3  PLT 203    Basic Metabolic Panel: Recent Labs  Lab 05/24/20 1809 05/25/20 0720 05/25/20 1644 05/26/20 0834 05/27/20 0306 05/28/20 0220 05/29/20 1121  NA 131*  --   --  135 136 130* 129*  K 3.7  --   --  3.0* 3.8 3.7 3.5  CL 106  --   --  106 109 101 97*  CO2 19*  --   --  21* 19* 21* 24  GLUCOSE 98  --   --  142* 110* 124* 137*  BUN 19  --   --  12 12 12 17   CREATININE 0.63  --   --  0.59 0.46 0.46 0.47  CALCIUM 8.0*  --   --  8.3* 8.2* 8.1* 8.2*  MG 1.9 2.0 1.8 1.7 2.1 1.8  --   PHOS 2.4* 2.1* 2.9 2.6 2.7  --   --    GFR: Estimated Creatinine Clearance: 39 mL/min (by C-G formula based on SCr of 0.47 mg/dL). Recent Labs  Lab 05/28/20 0220  WBC 12.9*    Liver Function Tests: No results for input(s): AST, ALT, ALKPHOS, BILITOT, PROT, ALBUMIN in the last 168 hours. No results for input(s): LIPASE, AMYLASE in the last 168 hours. No results for input(s): AMMONIA in the last 168 hours.  ABG    Component Value Date/Time   PHART 7.412 05/14/2020 1347   PCO2ART 36.8 05/14/2020 1347   PO2ART 82 (L) 05/14/2020 1347   HCO3 23.7 05/14/2020 1354   TCO2 26 05/20/2020 1055   ACIDBASEDEF 1.0 05/14/2020 1354   O2SAT 67.0 05/14/2020 1354     Coagulation Profile: No results for input(s): INR, PROTIME in the last 168 hours.  Cardiac Enzymes: No results for input(s): CKTOTAL, CKMB, CKMBINDEX, TROPONINI in the last 168 hours.  HbA1C: Hgb A1c MFr Bld  Date/Time Value Ref Range Status  05/14/2020 02:40 AM 5.8 (H) 4.8 - 5.6 % Final    Comment:    (NOTE) Pre diabetes:          5.7%-6.4%  Diabetes:              >6.4%  Glycemic control for   <7.0% adults with diabetes     CBG: Recent Labs  Lab  05/29/20 1915 05/29/20 2255 05/30/20 0304 05/30/20 0735 05/30/20 1120  GLUCAP 135* 139* 123* 142* 124*      06/01/20, MD [facility] ICU Physician [facility]  Pager: [phone] Mobile: [phone] After hours: [phone].   05/30/2020 , 1:18 PM

## 2020-05-30 NOTE — Progress Notes (Signed)
Patient ID: Alexandra Mays, female   DOB: [birth year], 70 y.o.   MRN: [id number] BP 133/62 (BP Location: Left Arm)   Pulse 76   Temp 98.1 F (36.7 C) (Axillary)   Resp (!) 21   Ht 5\' 4"  (1.626 m)   Wt 38.3 kg   SpO2 90%   BMI 14.49 kg/m  Lethargic, arouses with moderate stimulation Purposeful, will say a few words Does not like to open eyes No real change since yesterday Continue current care

## 2020-05-30 NOTE — Progress Notes (Signed)
Report received from [facility], ICU RN. Pt and belongings transferred to 4N. VSS, no s/s distress at this time No further needs at this time. Will resume pt care. Lupe Carney, RN

## 2020-05-31 ENCOUNTER — Inpatient Hospital Stay (HOSPITAL_COMMUNITY): Payer: Medicare Other

## 2020-05-31 DIAGNOSIS — I609 Nontraumatic subarachnoid hemorrhage, unspecified: Secondary | ICD-10-CM

## 2020-05-31 DIAGNOSIS — G9341 Metabolic encephalopathy: Secondary | ICD-10-CM | POA: Diagnosis not present

## 2020-05-31 DIAGNOSIS — Z4659 Encounter for fitting and adjustment of other gastrointestinal appliance and device: Secondary | ICD-10-CM

## 2020-05-31 DIAGNOSIS — I671 Cerebral aneurysm, nonruptured: Secondary | ICD-10-CM

## 2020-05-31 LAB — POCT I-STAT 7, (LYTES, BLD GAS, ICA,H+H)
Acid-Base Excess: 2 mmol/L (ref 0.0–2.0)
Bicarbonate: 26.2 mmol/L (ref 20.0–28.0)
Calcium, Ion: 1.23 mmol/L (ref 1.15–1.40)
HCT: 33 % — ABNORMAL LOW (ref 36.0–46.0)
Hemoglobin: 11.2 g/dL — ABNORMAL LOW (ref 12.0–15.0)
O2 Saturation: 60 %
Potassium: 4 mmol/L (ref 3.5–5.1)
Sodium: 129 mmol/L — ABNORMAL LOW (ref 135–145)
TCO2: 27 mmol/L (ref 22–32)
pCO2 arterial: 38.8 mmHg (ref 32.0–48.0)
pH, Arterial: 7.437 (ref 7.350–7.450)
pO2, Arterial: 30 mmHg — CL (ref 83.0–108.0)

## 2020-05-31 LAB — BASIC METABOLIC PANEL
Anion gap: 8 (ref 5–15)
BUN: 31 mg/dL — ABNORMAL HIGH (ref 8–23)
CO2: 23 mmol/L (ref 22–32)
Calcium: 8.6 mg/dL — ABNORMAL LOW (ref 8.9–10.3)
Chloride: 95 mmol/L — ABNORMAL LOW (ref 98–111)
Creatinine, Ser: 0.55 mg/dL (ref 0.44–1.00)
GFR, Estimated: >60 mL/min (ref >60)
Glucose, Bld: 119 mg/dL — ABNORMAL HIGH (ref 70–99)
Potassium: 4.6 mmol/L (ref 3.5–5.1)
Sodium: 126 mmol/L — ABNORMAL LOW (ref 135–145)

## 2020-05-31 LAB — GLUCOSE, CAPILLARY
Glucose-Capillary: 107 mg/dL — ABNORMAL HIGH (ref 70–99)
Glucose-Capillary: 115 mg/dL — ABNORMAL HIGH (ref 70–99)
Glucose-Capillary: 120 mg/dL — ABNORMAL HIGH (ref 70–99)
Glucose-Capillary: 136 mg/dL — ABNORMAL HIGH (ref 70–99)
Glucose-Capillary: 137 mg/dL — ABNORMAL HIGH (ref 70–99)
Glucose-Capillary: 141 mg/dL — ABNORMAL HIGH (ref 70–99)

## 2020-05-31 LAB — URINALYSIS, MICROSCOPIC (REFLEX)
RBC / HPF: NONE SEEN RBC/hpf (ref 0–5)
WBC, UA: >50 WBC/hpf (ref 0–5)

## 2020-05-31 LAB — SODIUM
Sodium: 131 mmol/L — ABNORMAL LOW (ref 135–145)
Sodium: 129 mmol/L — ABNORMAL LOW (ref 135–145)
Sodium: 130 mmol/L — ABNORMAL LOW (ref 135–145)

## 2020-05-31 LAB — CBC WITH DIFFERENTIAL/PLATELET
Abs Immature Granulocytes: 0.04 10*3/uL (ref 0.00–0.07)
Basophils Absolute: 0 10*3/uL (ref 0.0–0.1)
Basophils Relative: 0 %
Eosinophils Absolute: 0 10*3/uL (ref 0.0–0.5)
Eosinophils Relative: 0 %
HCT: 34.1 % — ABNORMAL LOW (ref 36.0–46.0)
Hemoglobin: 11.9 g/dL — ABNORMAL LOW (ref 12.0–15.0)
Immature Granulocytes: 0 %
Lymphocytes Relative: 12 %
Lymphs Abs: 1.2 10*3/uL (ref 0.7–4.0)
MCH: 31.4 pg (ref 26.0–34.0)
MCHC: 34.9 g/dL (ref 30.0–36.0)
MCV: 90 fL (ref 80.0–100.0)
Monocytes Absolute: 0.8 10*3/uL (ref 0.1–1.0)
Monocytes Relative: 7 %
Neutro Abs: 8.3 10*3/uL — ABNORMAL HIGH (ref 1.7–7.7)
Neutrophils Relative %: 81 %
Platelets: 230 10*3/uL (ref 150–400)
RBC: 3.79 MIL/uL — ABNORMAL LOW (ref 3.87–5.11)
RDW: 14 % (ref 11.5–15.5)
WBC: 10.4 10*3/uL (ref 4.0–10.5)
nRBC: 0 % (ref 0.0–0.2)

## 2020-05-31 LAB — URINALYSIS, ROUTINE W REFLEX MICROSCOPIC
Bilirubin Urine: NEGATIVE
Glucose, UA: NEGATIVE mg/dL
Hgb urine dipstick: NEGATIVE
Ketones, ur: NEGATIVE mg/dL
Nitrite: POSITIVE — AB
Protein, ur: NEGATIVE mg/dL
Specific Gravity, Urine: 1.02 (ref 1.005–1.030)
pH: 6.5 (ref 5.0–8.0)

## 2020-05-31 LAB — SODIUM, URINE, RANDOM: Sodium, Ur: 110 mmol/L

## 2020-05-31 LAB — OSMOLALITY, URINE: Osmolality, Ur: 749 mOsm/kg (ref 300–900)

## 2020-05-31 LAB — OSMOLALITY
Osmolality: 283 mOsm/kg (ref 275–295)
Osmolality: 283 mOsm/kg (ref 275–295)

## 2020-05-31 LAB — AMMONIA: Ammonia: 15 umol/L (ref 9–35)

## 2020-05-31 LAB — MAGNESIUM: Magnesium: 2.1 mg/dL (ref 1.7–2.4)

## 2020-05-31 IMAGING — CT CT HEAD W/O CM
4 series · 15 of 47 positions shown, 17 images · non-contrast
Comparison: [DATE] and earlier.
COMPARISON: [DATE] and earlier.

Addendum:
CLINICAL DATA: 69-year-old female with neurologic deficit. Recently
ruptured and coil embolized right posterior communicating artery
aneurysm. Brain MRI and intracranial MRA

EXAM:
CT HEAD WITHOUT CONTRAST
TECHNIQUE: Contiguous axial images were obtained from the base of the skull
through the vertex without intravenous contrast.

[Series 3: head without · axial · non-contrast · 0.44mm/px · z∈[-91,+24]mm · 6 of 33 slices shown, 8 images]
[im 5/33  brain]
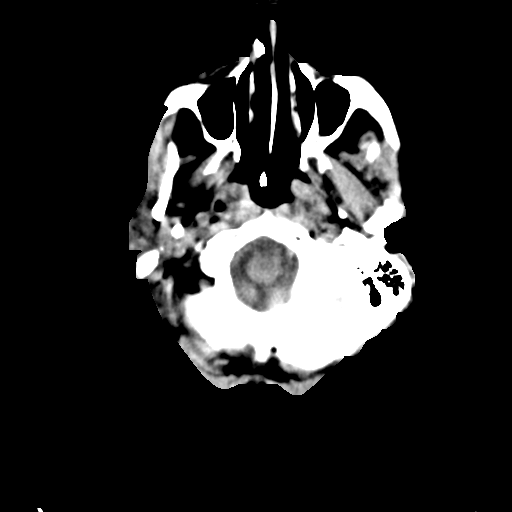
[im 5/33  bone]
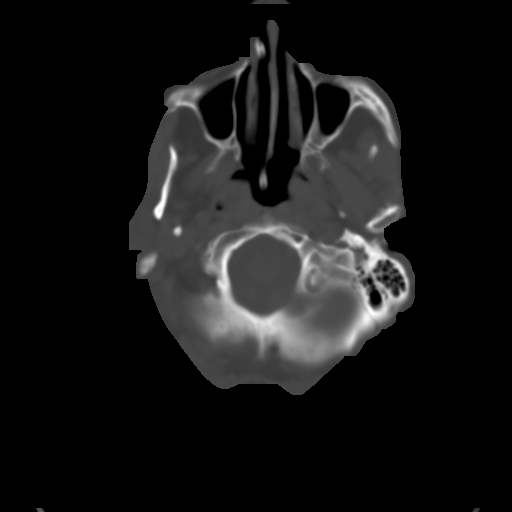
[im 10/33  brain]
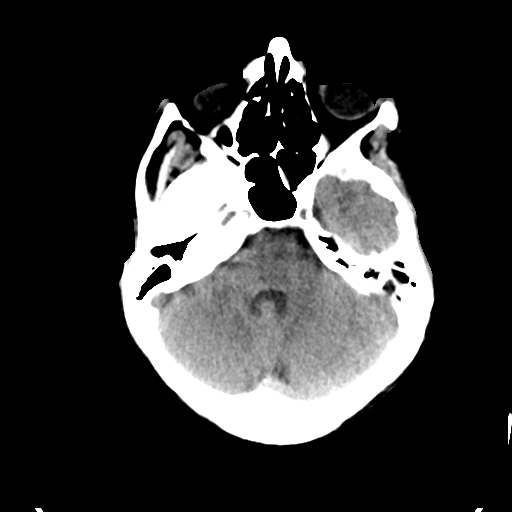
[im 14/33  brain]
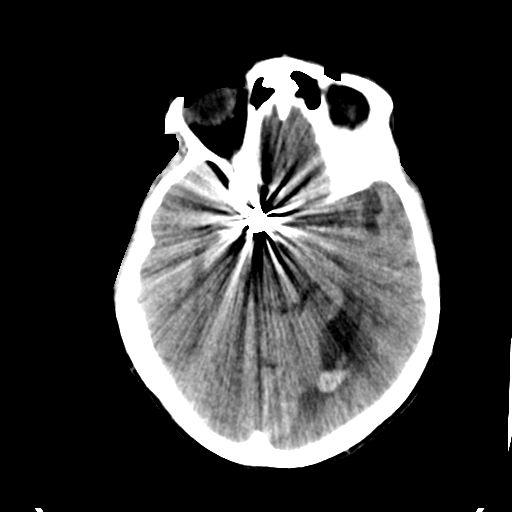
[im 19/33  brain]
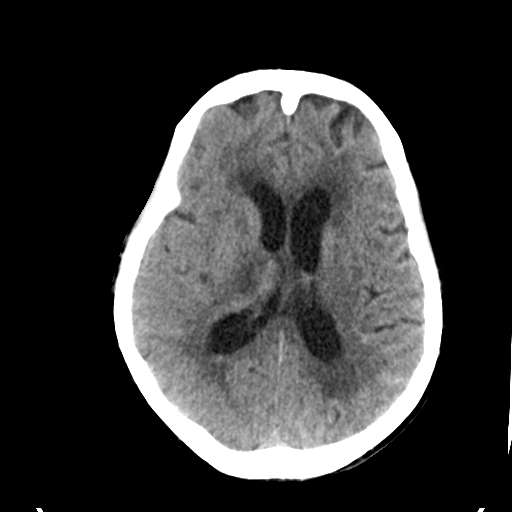
[im 23/33  brain]
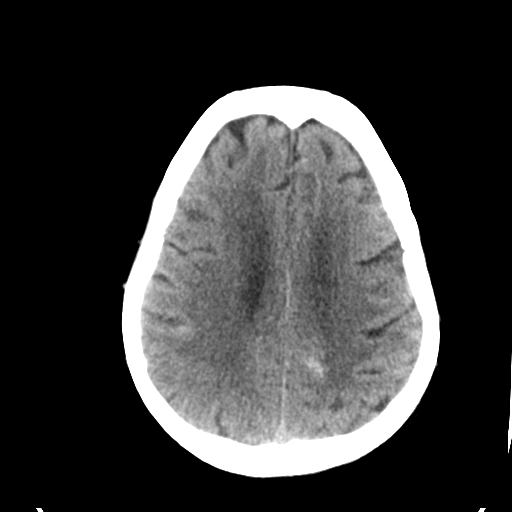
[im 23/33  bone]
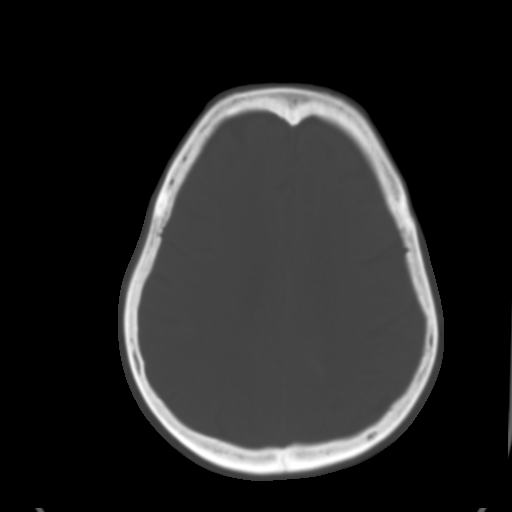
[im 28/33  brain]
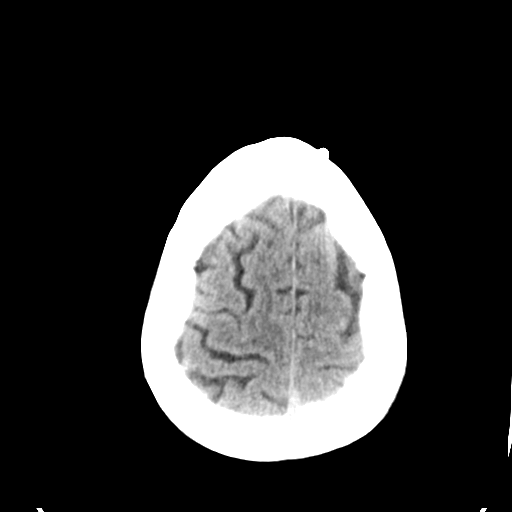

[Series 4: head bone · axial · 0.44mm/px · z∈[-95,-51]mm · 3 of 91 slices shown]
[im 9/91  bone]
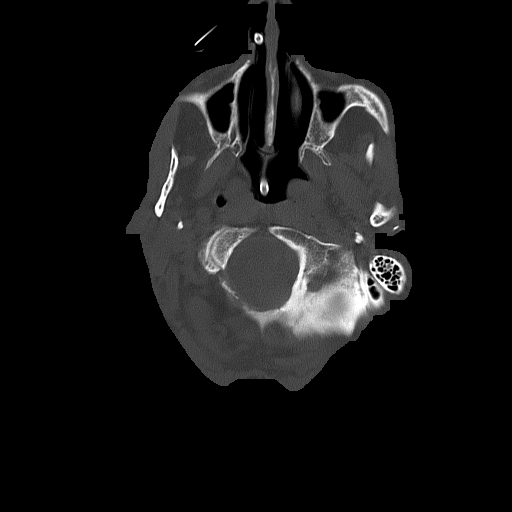
[im 18/91  bone]
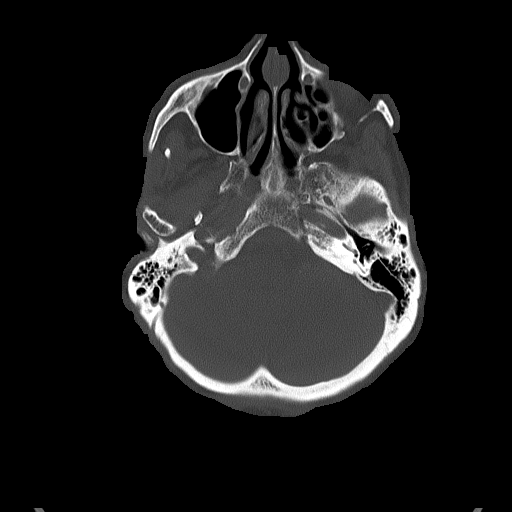
[im 31/91  bone]
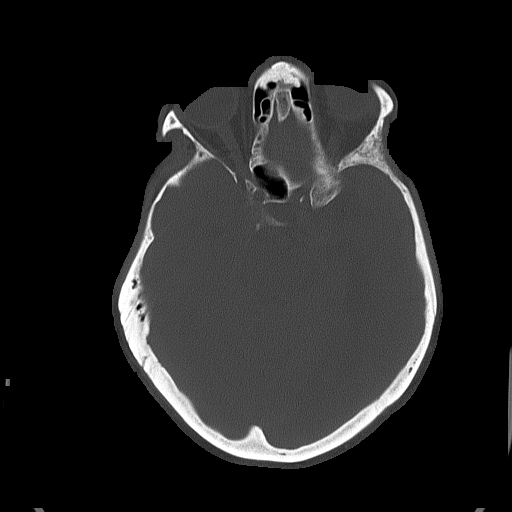

[Series 5: head without cor · coronal · non-contrast · 0.36mm/px · 3 of 67 slices shown]
[im 23/67  brain]
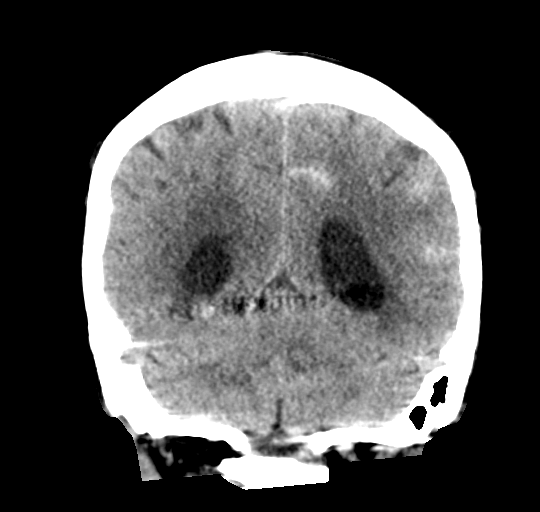
[im 30/67  brain]
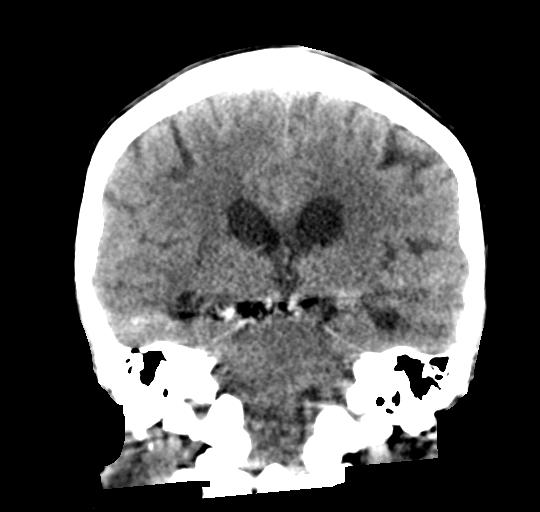
[im 37/67  brain]
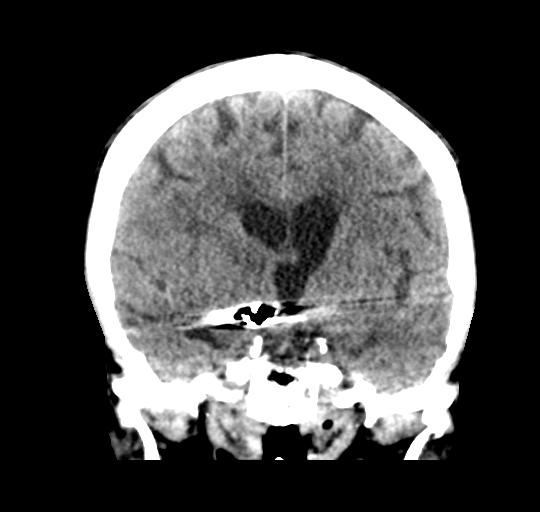

[Series 6: head without sag · sagittal · non-contrast · 0.36mm/px · 3 of 64 slices shown]
[im 22/64  brain]
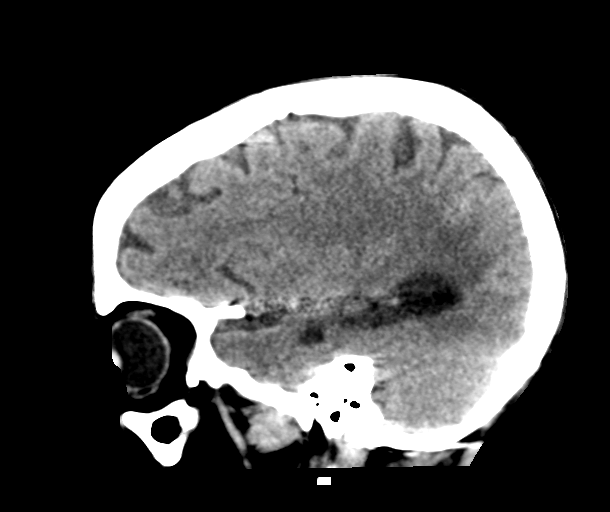
[im 32/64  brain]
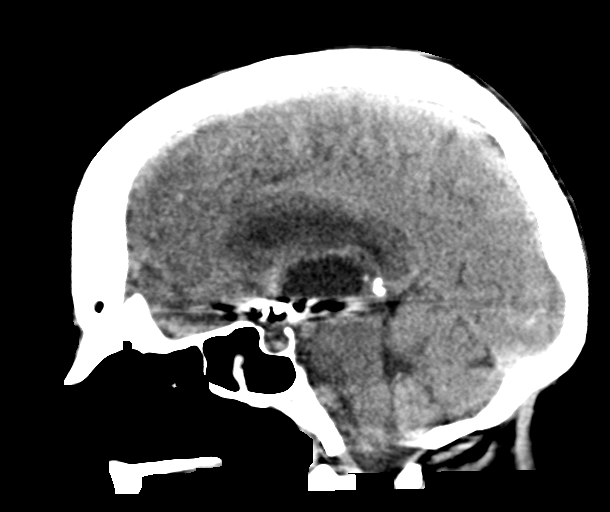
[im 43/64  brain]
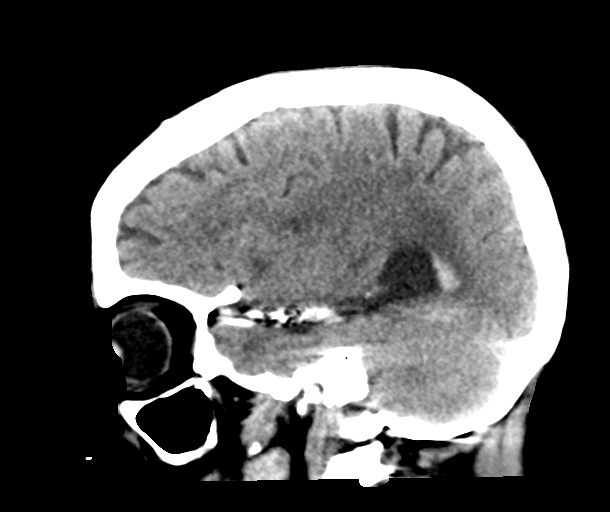

[15 of 47 positions shown; findings below may reference images not displayed]

FINDINGS: Brain: Streak artifact from aneurysm coil pack. Stable
ventriculomegaly and small volume intraventricular hemorrhage
layering in the occipital horns. Small volume residual subarachnoid
hemorrhage is stable.

Confluent hypodensity has developed in the right thalamus since
[DATE], and was largely un apparent on the recent MRI.

Stable periventricular white matter hypodensity elsewhere. No acute
cortically based infarct identified.

Vascular: Calcified atherosclerosis at the skull base. Stable right
posterior communicating artery aneurysm coil pack configuration.

Skull: Fiducials remain in place over the superior frontal
convexity. No acute osseous abnormality identified.

Sinuses/Orbits: Visualized paranasal sinuses and mastoids are stable
and well pneumatized.

Other: Right nasoenteric tube remains in place. Stable orbit and
scalp soft tissues.
IMPRESSION: 1. Moderate edema in the right thalamus is new and suspicious for
lacunar infarct in this setting.
2. Stable ventriculomegaly with transependymal edema and small
volume IVH.
3. Stable small volume KO KO. Right Pcomm aneurysm coil pack again
noted.

ADDENDUM:
Study discussed by telephone with Provider KO KO on
[DATE] at [OF] hours.

*** End of Addendum ***
FINDINGS: Brain: Streak artifact from aneurysm coil pack. Stable
ventriculomegaly and small volume intraventricular hemorrhage
layering in the occipital horns. Small volume residual subarachnoid
hemorrhage is stable.

Confluent hypodensity has developed in the right thalamus since
[DATE], and was largely un apparent on the recent MRI.

Stable periventricular white matter hypodensity elsewhere. No acute
cortically based infarct identified.

Vascular: Calcified atherosclerosis at the skull base. Stable right
posterior communicating artery aneurysm coil pack configuration.

Skull: Fiducials remain in place over the superior frontal
convexity. No acute osseous abnormality identified.

Sinuses/Orbits: Visualized paranasal sinuses and mastoids are stable
and well pneumatized.

Other: Right nasoenteric tube remains in place. Stable orbit and
scalp soft tissues.
IMPRESSION: 1. Moderate edema in the right thalamus is new and suspicious for
lacunar infarct in this setting.
2. Stable ventriculomegaly with transependymal edema and small
volume IVH.
3. Stable small volume KO KO. Right Pcomm aneurysm coil pack again
noted.

## 2020-05-31 MED ORDER — IPRATROPIUM-ALBUTEROL 0.5-2.5 (3) MG/3ML IN SOLN
3.0000 mL | Freq: Four times a day (QID) | RESPIRATORY_TRACT | Status: DC
  Administered 2020-05-31 – 2020-06-03 (×11): 3 mL via RESPIRATORY_TRACT
  Filled 2020-05-31 (×11): qty 3

## 2020-05-31 MED ORDER — SODIUM CHLORIDE 0.9 % IV SOLN
INTRAVENOUS | Status: DC | PRN
  Administered 2020-05-31: 500 mL via INTRAVENOUS

## 2020-05-31 MED ORDER — SODIUM CHLORIDE 0.9 % IV SOLN
1.0000 g | INTRAVENOUS | Status: DC
  Filled 2020-05-31: qty 10

## 2020-05-31 MED ORDER — SODIUM CHLORIDE 3 % IV BOLUS
50.0000 mL | Freq: Once | INTRAVENOUS | Status: AC
  Administered 2020-05-31: 50 mL via INTRAVENOUS
  Filled 2020-05-31 (×2): qty 500

## 2020-05-31 MED ORDER — LEVETIRACETAM IN NACL 500 MG/100ML IV SOLN
500.0000 mg | Freq: Two times a day (BID) | INTRAVENOUS | Status: DC
Start: 2020-05-31 — End: 2020-06-01
  Administered 2020-05-31 (×2): 500 mg via INTRAVENOUS
  Filled 2020-05-31 (×3): qty 100

## 2020-05-31 MED ORDER — ORAL CARE MOUTH RINSE
15.0000 mL | Freq: Two times a day (BID) | OROMUCOSAL | Status: DC
  Administered 2020-05-31 – 2020-06-04 (×9): 15 mL via OROMUCOSAL

## 2020-05-31 MED ORDER — VITAL HIGH PROTEIN PO LIQD
1000.0000 mL | ORAL | Status: DC
  Administered 2020-05-31: 1000 mL

## 2020-05-31 MED ORDER — CHLORHEXIDINE GLUCONATE 0.12 % MT SOLN
15.0000 mL | Freq: Two times a day (BID) | OROMUCOSAL | Status: DC
  Administered 2020-05-31 – 2020-06-04 (×8): 15 mL via OROMUCOSAL
  Filled 2020-05-31 (×3): qty 15

## 2020-05-31 MED ORDER — LACTATED RINGERS IV SOLN: INTRAVENOUS | Status: DC

## 2020-05-31 MED ORDER — SODIUM CHLORIDE 0.9 % IV SOLN
2.0000 g | INTRAVENOUS | Status: DC
  Administered 2020-05-31 – 2020-06-02 (×3): 2 g via INTRAVENOUS
  Filled 2020-05-31: qty 20
  Filled 2020-05-31 (×3): qty 2

## 2020-05-31 NOTE — Progress Notes (Signed)
RT attempted to obtain ABG. Sample obtained after 2 attempts. CCM NP Renae Fickle given critical results and made aware sample is venous rather than arterial. No new orders received at this time. RT will continue to monitor and be available as needed.

## 2020-05-31 NOTE — Procedures (Signed)
EEG Report Indication: possible seizure activity--recent ACOMMA coiling/diminished responsiveness after  This study was recorded in the waking/drowsy state.  The duration of the study was 31 minutes.  Electrodes were placed according to the International 10/20 system.  Video was reviewed for clinical correlation as needed.  In the waking state, some degree of background organization is seen with an attenuated but perceptible anterior - posterior voltage and frequency gradient.  In the occipital leads there is a symmetric and reactive posterior dominant rhythm of approximately 6-7 hertz, which is slower than expected for age, very poorly sustained and indistinct.  Anteriorly, is the expected pattern of faster frequency, lower voltage waveforms though in a significantly reduced amount as compared to normal.  During the drowsy state, there is attenuation of the gradient, a general shift to slower frequencies diffusely to the theta range/diminished alpha, a waxing and waning of the posterior dominant rhythm, and slow roving eye movements.  No clear sleep transients were seen.  Hyperventilation: deferred Photic stimulation: unremarkable  There are no clear focal, paroxysmal or epileptiform abnormalities seen during this portion of the recording.  Impression:  This is an abnormal waking and drowsy study due to attenuated organization as described above--clinically most consistent with a mild diffuse encephalopathy.  There are no clear focal or epileptiform abnormalities.

## 2020-05-31 NOTE — Progress Notes (Signed)
Inpatient Rehabilitation Admissions Coordinator  Noted plans as outlined. I will follow from a distance.  Ottie Glazier, RN, MSN Rehab Admissions Coordinator [phone] 05/31/2020 1:05 PM

## 2020-05-31 NOTE — Progress Notes (Signed)
Physical Therapy Treatment Patient Details Name: Alexandra Mays MRN: [id number] DOB: 09-04-1950 Today's Date: 05/31/2020    History of Present Illness 70 y.o. female who presented 05/20/20 with R neck pain and a headache. 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 Pt recently discharged from hospital 05/16/20 with NSTEMI. 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 PMH: dyspnea, COPD, AAA, pure hyperglyceridemia.    PT Comments    Pt transferred back to ICU; displays worsening mentation and requiring increased assist for all aspects of mobility. Session focused on ROM exercises and transitioning to edge of bed to attempt promoting arousal and position change. Pt able to answer a few yes or no questions, but otherwise not following commands. Will continue to progress as tolerated.    Follow Up Recommendations  CIR;Supervision/Assistance - 24 hour     Equipment Recommendations  3in1 (PT);Wheelchair (measurements PT);Wheelchair cushion (measurements PT)    Recommendations for Other Services       Precautions / Restrictions Precautions Precautions: Fall Precaution Comments: NGT, mittens Restrictions Weight Bearing Restrictions: No    Mobility  Bed Mobility Overal bed mobility: Needs Assistance Bed Mobility: Supine to Sit;Sit to Supine     Supine to sit: Max assist Sit to supine: Max assist;+2 for physical assistance   General bed mobility comments: Decreased initiation, maxA for all aspects of bed mobility    Transfers Overall transfer level: Needs assistance Equipment used: None Transfers: Sit to/from Stand Sit to Stand: Max assist         General transfer comment: MaxA for standing from edge of bed, increased trunk flexion/crouched posture  Ambulation/Gait                 Stairs             Wheelchair Mobility     Modified Rankin (Stroke Patients Only) Modified Rankin (Stroke Patients Only) Pre-Morbid Rankin Score: No significant disability Modified Rankin: Severe disability     Balance Overall balance assessment: Needs assistance Sitting-balance support: Bilateral upper extremity supported;Feet supported Sitting balance-Leahy Scale: Poor Sitting balance - Comments: Anterior tilt   Standing balance support: Bilateral upper extremity supported;During functional activity Standing balance-Leahy Scale: Poor                              Cognition Arousal/Alertness: Awake/alert Behavior During Therapy: Flat affect Overall Cognitive Status: History of cognitive impairments - at baseline Area of Impairment: Following commands                       Following Commands: Follows one step commands inconsistently       General Comments: Pt with eyes closed throughout, able to state "yes," to a few questions, otherwise not following commands.      Exercises General Exercises - Lower Extremity Ankle Circles/Pumps: PROM;Both;15 reps;Supine Long Arc Quad: PROM;Both;10 reps;Seated Heel Slides: PROM;Both;10 reps;Supine    General Comments  VSS on RA      Pertinent Vitals/Pain Pain Assessment: Faces Faces Pain Scale: Hurts little more Pain Location: generalized with mobility Pain Descriptors / Indicators: Moaning Pain Intervention(s): Limited activity within patient's tolerance;Monitored during session    Home Living                      Prior Function  PT Goals (current goals can now be found in the care plan section) Acute Rehab PT Goals Patient Stated Goal: to improve Potential to Achieve Goals: Fair Progress towards PT goals: Not progressing toward goals - comment    Frequency    Min 4X/week      PT Plan Current plan remains appropriate    Co-evaluation              AM-PAC PT "6 Clicks" Mobility   Outcome Measure  Help  needed turning from your back to your side while in a flat bed without using bedrails?: Total Help needed moving from lying on your back to sitting on the side of a flat bed without using bedrails?: Total Help needed moving to and from a bed to a chair (including a wheelchair)?: Total Help needed standing up from a chair using your arms (e.g., wheelchair or bedside chair)?: Total Help needed to walk in hospital room?: Total Help needed climbing 3-5 steps with a railing? : Total 6 Click Score: 6    End of Session Equipment Utilized During Treatment: Gait belt Activity Tolerance: Patient limited by lethargy Patient left: in bed;with call bell/phone within reach;with bed alarm set Nurse Communication: Mobility status PT Visit Diagnosis: Unsteadiness on feet (R26.81);Other abnormalities of gait and mobility (R26.89);Muscle weakness (generalized) (M62.81);Difficulty in walking, not elsewhere classified (R26.2);Other symptoms and signs involving the nervous system (R29.898)     Time: 2035-5974 PT Time Calculation (min) (ACUTE ONLY): 34 min  Charges:  $Therapeutic Exercise: 8-22 mins $Therapeutic Activity: 8-22 mins                     Lillia Pauls, PT, DPT Acute Rehabilitation Services Pager [phone] Office [phone]    [name] 05/31/2020, 2:37 PM

## 2020-05-31 NOTE — Plan of Care (Signed)
Pt not interactive when performing care of patient.

## 2020-05-31 NOTE — Progress Notes (Addendum)
NAME:  Alexandra Mays, MRN:  [id number], DOB:  1950-09-30, LOS: 11 ADMISSION DATE:  05/20/2020, CONSULTATION DATE: 05/21/2020 REFERRING MD: Dr. 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 Lives with granddaughter, but is able to drive, shop, and give childcare.   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   Significant Hospital Events:   Has remained in ICU on vasospasm watch.  TCD's have been normal although patient continues to be fairly somnolent. Follow-up CT scan 3/18 and MRI small age-indeterminate strokes but no convincing evidence of vasospasm  Interim History / Subjective:   Unresponsive this morning on neurosurgery evaluation and transferred back to ICU.   Objective   Blood pressure 130/84, pulse 94, temperature 98.1 F (36.7 C), temperature source Axillary, resp.  rate (!) 30, height 5\' 4"  (1.626 m), weight 41.9 kg, SpO2 92 %.        Intake/Output Summary (Last 24 hours) at 05/31/2020 1111 Last data filed at 05/31/2020 0930 Gross per 24 hour  Intake 50 ml  Output 775 ml  Net -725 ml   Filed Weights   05/26/20 0458 05/28/20 0400 05/31/20 0500  Weight: 41.8 kg 38.3 kg 41.9 kg    Examination: General:  Frail elderly female  Neuro:  unresponsive HEENT:  [place]/AT, No JVD noted, PERRL Cardiovascular:  RRR, no MRG Lungs:  Clear bilateral breath sounds Abdomen:  Soft, non-distended Musculoskeletal:  No acute deformity Skin:  Intact, MMM  Resolved Hospital Problem list     Assessment & Plan:   Aneurysmal subarachnoid hemorrhage H/H 2, MF 3 due to ruptured P-comm aneurysm status post coiling - neurosurgery following, considering ventriculostomy  - TCD pending today - nimodipine continue  Acute encephalopathy: etiology is not obvious. Certainly could be related to recent subarachnoid and hydrocephalus vs possible vasospasm. Alternatively the cause could be metabolic in nature considering sodium is 126 and she could very well be hypercarbic.  - Transfer to ICU - ABG now - CBG now - Hypertonic saline bolus and gauge response.  - may require intubation if not improved over the next few hours.  Ischemic chronic systolic congestive heart failure, EF 20 to 25% - home statin, losartan, carvedilol currently on hold.  - will need to restart once BP can tolerate. Has been borderline with nimodipine.   Malnutrition - Tube feeds ongoing  Hyponatremia: CSW vs SIADH - Hypertonic saline bolus as above.   COPD, not in exacerbation - duonebs, budesonide nebs in place of home breo, incruse.  Dementia - mild at baseline. Still drives, cooks, childcare.   UTI  - CTX start  Best practice (evaluated daily)  Diet: Tube feeds Pain/Anxiety/Delirium protocol (if indicated): Tylenol  PRN VAP protocol (if indicated): N/A DVT prophylaxis: SCDs GI  prophylaxis: Famotidine Glucose control: SSI Mobility: As tolerated Disposition: Transfer to ICU  Goals of Care:  Last date of multidisciplinary goals of care discussion: Per primary team Code Status: Full code. Patient would not want long term life support. But a time limited trial on intubation would be ok if indicated.   Labs   CBC: Recent Labs  Lab 05/28/20 0220 05/31/20 0847  WBC 12.9* 10.4  NEUTROABS  --  8.3*  HGB 12.4 11.9*  HCT 36.2 34.1*  MCV 90.3 90.0  PLT 203 230    Basic Metabolic Panel: Recent Labs  Lab 05/24/20 1809 05/25/20 0720 05/25/20 1644 05/26/20 0834 05/27/20 0306 05/28/20 0220 05/29/20 1121 05/31/20 0847  NA 131*  --   --  135 136 130* 129* 126*  K 3.7  --   --  3.0* 3.8 3.7 3.5 4.6  CL 106  --   --  106 109 101 97* 95*  CO2 19*  --   --  21* 19* 21* 24 23  GLUCOSE 98  --   --  142* 110* 124* 137* 119*  BUN 19  --   --  12 12 12 17  31*  CREATININE 0.63  --   --  0.59 0.46 0.46 0.47 0.55  CALCIUM 8.0*  --   --  8.3* 8.2* 8.1* 8.2* 8.6*  MG 1.9 2.0 1.8 1.7 2.1 1.8  --   --   PHOS 2.4* 2.1* 2.9 2.6 2.7  --   --   --    GFR: Estimated Creatinine Clearance: 42.7 mL/min (by C-G formula based on SCr of 0.55 mg/dL). Recent Labs  Lab 05/28/20 0220 05/31/20 0847  WBC 12.9* 10.4    Liver Function Tests: No results for input(s): AST, ALT, ALKPHOS, BILITOT, PROT, ALBUMIN in the last 168 hours. No results for input(s): LIPASE, AMYLASE in the last 168 hours. No results for input(s): AMMONIA in the last 168 hours.  ABG    Component Value Date/Time   PHART 7.412 05/14/2020 1347   PCO2ART 36.8 05/14/2020 1347   PO2ART 82 (L) 05/14/2020 1347   HCO3 23.7 05/14/2020 1354   TCO2 26 05/20/2020 1055   ACIDBASEDEF 1.0 05/14/2020 1354   O2SAT 67.0 05/14/2020 1354     Coagulation Profile: No results for input(s): INR, PROTIME in the last 168 hours.  Cardiac Enzymes: No results for input(s): CKTOTAL, CKMB, CKMBINDEX, TROPONINI in the last 168  hours.  HbA1C: Hgb A1c MFr Bld  Date/Time Value Ref Range Status  05/14/2020 02:40 AM 5.8 (H) 4.8 - 5.6 % Final    Comment:    (NOTE) Pre diabetes:          5.7%-6.4%  Diabetes:              >6.4%  Glycemic control for   <7.0% adults with diabetes     CBG: Recent Labs  Lab 05/30/20 1936 05/30/20 2315 05/31/20 0324 05/31/20 0729 05/31/20 1102  GLUCAP 151* 139* 137* 141* 136*    Critical care time 40 minutes.    06/02/20, AGACNP-BC [facility]  See Amion for personal pager PCCM on call pager [phone] until 7pm. Please call Elink 7p-7a. [phone]  05/31/2020 11:31 AM

## 2020-05-31 NOTE — Progress Notes (Signed)
  Speech Language Pathology Treatment: Dysphagia;Cognitive-Linquistic  Patient Details Name: Alexandra Mays MRN: [id number] DOB: [birth year] Today's Date: 05/31/2020 Time: 7253-6644 SLP Time Calculation (min) (ACUTE ONLY): 10 min  Assessment / Plan / Recommendation Clinical Impression  Pt transferred to ICU due to somnolence for several days. She kept eyes closed and responded approx 30% of the time with head gesture, intermittently opened oral cavity to allow hygiene removing dried mucous diffusely. Congested throat clears unable to mobilize volitionally. Use of moistened toothette, Yankeurs elicited cough and removal or upper pharyngeal mucous. Ice chip not administered. Should be NPO until more alert.  Attempted to state granddaughters name with additional time and repetition, followed several commands as stated above. Continue to follow plan of treatment.    HPI HPI: 70 y.o. 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 PMHx recent NSTEMI, AAA, COPD. Lives with granddaughter Alexandra Mays. Dx'd with memory disorder five years ago (needs reminders re: appts, meds; [name] leaves sticky notes with reminders around the house) but still drives, picks grandchildren up from school      SLP Plan  Continue with current plan of care       Recommendations  Diet recommendations: NPO Medication Administration: Via alternative means                Oral Care Recommendations: Oral care BID Follow up Recommendations: Skilled Nursing facility SLP Visit Diagnosis: Dysphagia, unspecified (R13.10);Cognitive communication deficit (I34.742) Plan: Continue with current plan of care       GO                Alexandra Mays 05/31/2020, 10:19 AM  Alexandra Mays Alexandra Mays.Ed Nurse, children's [phone] Office [phone]

## 2020-05-31 NOTE — Progress Notes (Addendum)
  NEUROSURGERY PROGRESS NOTE   No issues overnight.  Patient somnolent this am  EXAM:  BP 138/73 (BP Location: Left Arm)   Pulse 88   Temp 98.8 F (37.1 C) (Axillary)   Resp (!) 30   Ht 5\' 4"  (1.626 m)   Wt 41.9 kg   SpO2 92%   BMI 15.86 kg/m   Somnolent Does not open eyes to voice/pain Requires significant stimulation for responses which are minimal. Patient just says yes at times. No other verbal response. PERRL No motor response to pain  IMPRESSION/PLAN 70 y.o. female  SAHd#12 s/p RPcom aneurysm coiling. Progressive decline in neuro status over the last 48 hours.  Did have evidence of hydro on prior CT/MRI. ?Symptomatic now. Stat head CT. CBC, BMP, UA. May consider placement of EVD. Keppra was d/c 3/18. ?seizure.   Attending Dr 4/18 notified. Will await imaging.  Head CT shows stable ventriculomegaly, new thalamic edema but certainly not cause of AMS. Will get stat EEG. If negative, will consider placement of EVD pending rest of work up.   I have seen and examined Alexandra Mays and agree with the exam, impression, and plan as documented in the note by Chauncy Lean, PA-C.  70 y.o. woman SAH d#12 s/p RPcom coiling with several days of progressively worsening somnolence. Etiology unclear however she is hyponatremic and UA c/w UTI. Her Keppra was discontinued however spot EEG this am is negative for SZ. Multiple scans over this period have demonstrated ventriculomegaly however this is stable and non-progressive. With the stability of her scans I think it prudent to treat her hyponatremia and UTI and see if her mental status improves.   70, MD [facility]

## 2020-05-31 NOTE — Progress Notes (Signed)
EEG complete - results pending 

## 2020-05-31 NOTE — Progress Notes (Signed)
Transcranial Doppler  Date POD PCO2 HCT BP  MCA ACA PCA OPHT SIPH VERT Basilar       Right  Left                                       3/14 MR     Right  Left   55  41   -52  -33   38  29   21  17   17  24    -23  -27   -24      3/16 GC     Right  Left   72  38   -18  -42   40  24   20  16   30  24    -20  -25   -20      3/18 MR      Right  Left   *  34   *  -39   21  21   25  25    40  *   -26  -26   -31      3/21RH     Right  Left   91  47   -28  -50   36  -30   21  20    -38  32   -26  -22   *           Right  Left                                            Right  Left                                        MCA = Middle Cerebral Artery      OPHT = Opthalmic Artery     BASILAR = Basilar Artery   ACA = Anterior Cerebral Artery     SIPH = Carotid Siphon PCA = Posterior Cerebral Artery   VERT = Verterbral Artery                   Normal MCA = 62+\-12 ACA = 50+\-12 PCA = 42+\-23  * = Unable to insonate  RT Lindegaard = 5.06, LT Lindegaard = 2.04  4/18, RDMS, RVT 05/31/2020

## 2020-05-31 NOTE — Progress Notes (Signed)
PROGRESS NOTE    Alexandra Mays  OZH:[id number] DOB: 1950/11/15 DOA: 05/20/2020 PCP: Tanna Furry, PA-C    Brief Narrative:  70 year old female with COPD and recent NSTEMI who presented with sudden onset of headache and neck pain for few days, noted to have subarachnoid hemorrhage H/H 2, MF 3 due to ruptured right P-comm aneurysm, status post coiling   Assessment & Plan:   Active Problems:   Subarachnoid hemorrhage due to cerebral aneurysm (HCC)   Protein-calorie malnutrition, severe   SAH due to ruptured P-comm aneursym -s/p coiling -currently on nimodipine -continue neuro checks -started back on keppra  Acute encephalopathy -appears to have worsening mental status -seen by neurosurgery this morning and transferred back to ICU -stat EEG ordered -if negative, they will consider placing EVD for hydrocephalus  Chronic systolic chf.  -EFT 20-25% -currently appears compensated  Hyponatremia -sodium currently 126 -she is on oral salt tabs -consider starting gentle IV hydration  COPD -no signs of exacerbation -continue bronchodilators, inhaled steroids  Malnutrition -on tube feeds -has had dysphagia prior to admission -should be considered for GI evaluation with endoscopy once current issues improve    DVT prophylaxis: heparin injection 5,000 Units Start: 05/25/20 1530 SCD's Start: 05/20/20 1147  Code Status: full code Family Communication: no family present Disposition Plan: Status is: Inpatient.  Since patient has been transferred back to ICU for higher level of care, I have asked PCCM to assume care. Please let TRH know when patient is ready to move out of the ICU.  Remains inpatient appropriate because:Hemodynamically unstable and Inpatient level of care appropriate due to severity of illness   Dispo: The patient is from: Home              Anticipated d/c is to: TBD              Patient currently is not medically stable to d/c.   Difficult to place  patient No   Consultants:   Neurosurgery  Procedures:   3/10 Diagnostic cerebral angiogram, coil embolization of right posterior communicating artery aneurysm  Antimicrobials:       Subjective: unresponsive  Objective: Vitals:   05/31/20 0500 05/31/20 0730 05/31/20 0831 05/31/20 0930  BP:  138/73 (!) 142/76 130/84  Pulse:  88 94   Resp:  (!) 30    Temp:  98.8 F (37.1 C) 98.1 F (36.7 C)   TempSrc:  Axillary Axillary   SpO2:  92% 92%   Weight: 41.9 kg     Height:        Intake/Output Summary (Last 24 hours) at 05/31/2020 1039 Last data filed at 05/31/2020 0930 Gross per 24 hour  Intake 50 ml  Output 775 ml  Net -725 ml   Filed Weights   05/26/20 0458 05/28/20 0400 05/31/20 0500  Weight: 41.8 kg 38.3 kg 41.9 kg    Examination:  General exam: lethargic/unresponsive Respiratory system: coarse breath sounds bilaterally. Respiratory effort normal. Cardiovascular system: S1 & S2 heard, RRR. No JVD, murmurs, rubs, gallops or clicks.  Gastrointestinal system: Abdomen is nondistended, soft and nontender. No organomegaly or masses felt. Normal bowel sounds heard. NG tube in place Central nervous system: does not cooperate with exam, limited Extremities: no edema bilaterally Skin: No rashes, lesions or ulcers Psychiatry:lethargic    Data Reviewed: I have personally reviewed following labs and imaging studies  CBC: Recent Labs  Lab 05/28/20 0220 05/31/20 0847  WBC 12.9* 10.4  NEUTROABS  --  8.3*  HGB 12.4 11.9*  HCT 36.2 34.1*  MCV 90.3 90.0  PLT 203 230   Basic Metabolic Panel: Recent Labs  Lab 05/24/20 1809 05/25/20 0720 05/25/20 1644 05/26/20 0834 05/27/20 0306 05/28/20 0220 05/29/20 1121 05/31/20 0847  NA 131*  --   --  135 136 130* 129* 126*  K 3.7  --   --  3.0* 3.8 3.7 3.5 4.6  CL 106  --   --  106 109 101 97* 95*  CO2 19*  --   --  21* 19* 21* 24 23  GLUCOSE 98  --   --  142* 110* 124* 137* 119*  BUN 19  --   --  12 12 12 17  31*   CREATININE 0.63  --   --  0.59 0.46 0.46 0.47 0.55  CALCIUM 8.0*  --   --  8.3* 8.2* 8.1* 8.2* 8.6*  MG 1.9 2.0 1.8 1.7 2.1 1.8  --   --   PHOS 2.4* 2.1* 2.9 2.6 2.7  --   --   --    GFR: Estimated Creatinine Clearance: 42.7 mL/min (by C-G formula based on SCr of 0.55 mg/dL). Liver Function Tests: No results for input(s): AST, ALT, ALKPHOS, BILITOT, PROT, ALBUMIN in the last 168 hours. No results for input(s): LIPASE, AMYLASE in the last 168 hours. No results for input(s): AMMONIA in the last 168 hours. Coagulation Profile: No results for input(s): INR, PROTIME in the last 168 hours. Cardiac Enzymes: No results for input(s): CKTOTAL, CKMB, CKMBINDEX, TROPONINI in the last 168 hours. BNP (last 3 results) No results for input(s): PROBNP in the last 8760 hours. HbA1C: No results for input(s): HGBA1C in the last 72 hours. CBG: Recent Labs  Lab 05/30/20 1502 05/30/20 1936 05/30/20 2315 05/31/20 0324 05/31/20 0729  GLUCAP 131* 151* 139* 137* 141*   Lipid Profile: No results for input(s): CHOL, HDL, LDLCALC, TRIG, CHOLHDL, LDLDIRECT in the last 72 hours. Thyroid Function Tests: No results for input(s): TSH, T4TOTAL, FREET4, T3FREE, THYROIDAB in the last 72 hours. Anemia Panel: No results for input(s): VITAMINB12, FOLATE, FERRITIN, TIBC, IRON, RETICCTPCT in the last 72 hours. Sepsis Labs: No results for input(s): PROCALCITON, LATICACIDVEN in the last 168 hours.  No results found for this or any previous visit (from the past 240 hour(s)).       Radiology Studies: CT HEAD WO CONTRAST  Addendum Date: 05/31/2020   ADDENDUM REPORT: 05/31/2020 09:05 ADDENDUM: Study discussed by telephone with Provider VINCENT COSTELLA on 05/31/2020 at 0901 hours. Electronically Signed   By: 06/02/2020 M.D.   On: 05/31/2020 09:05   Result Date: 05/31/2020 CLINICAL DATA:  70 year old female with neurologic deficit. Recently ruptured and coil embolized right posterior communicating artery aneurysm.  Brain MRI and intracranial MRA EXAM: CT HEAD WITHOUT CONTRAST TECHNIQUE: Contiguous axial images were obtained from the base of the skull through the vertex without intravenous contrast. COMPARISON:  05/29/2020 and earlier. FINDINGS: Brain: Streak artifact from aneurysm coil pack. Stable ventriculomegaly and small volume intraventricular hemorrhage layering in the occipital horns. Small volume residual subarachnoid hemorrhage is stable. Confluent hypodensity has developed in the right thalamus since 05/28/2020, and was largely un apparent on the recent MRI. Stable periventricular white matter hypodensity elsewhere. No acute cortically based infarct identified. Vascular: Calcified atherosclerosis at the skull base. Stable right posterior communicating artery aneurysm coil pack configuration. Skull: Fiducials remain in place over the superior frontal convexity. No acute osseous abnormality identified. Sinuses/Orbits: Visualized paranasal sinuses and mastoids are stable and well pneumatized. Other: Right  nasoenteric tube remains in place. Stable orbit and scalp soft tissues. IMPRESSION: 1. Moderate edema in the right thalamus is new and suspicious for lacunar infarct in this setting. 2. Stable ventriculomegaly with transependymal edema and small volume IVH. 3. Stable small volume SAH. Right Pcomm aneurysm coil pack again noted. Electronically Signed: By: Odessa Fleming M.D. On: 05/31/2020 08:50        Scheduled Meds: . bethanechol  10 mg Per Tube TID  . carvedilol  6.25 mg Per Tube BID WC  . chlorhexidine  15 mL Mouth Rinse BID  . Chlorhexidine Gluconate Cloth  6 each Topical Daily  . feeding supplement (PROSource TF)  45 mL Per Tube Daily  . fiber  1 packet Per Tube BID  . fluticasone furoate-vilanterol  1 puff Inhalation Daily  . heparin injection (subcutaneous)  5,000 Units Subcutaneous Q8H  . mouth rinse  15 mL Mouth Rinse q12n4p  . melatonin  3 mg Per Tube QHS  . mirtazapine  7.5 mg Per Tube QHS  .  multivitamin with minerals  1 tablet Per Tube Daily  . niMODipine  60 mg Oral Q4H   Or  . niMODipine  60 mg Per Tube Q4H  . pantoprazole sodium  40 mg Per Tube Daily  . simvastatin  20 mg Per Tube Daily  . sodium chloride  2 g Per Tube BID WC  . umeclidinium bromide  2 puff Inhalation Daily   Continuous Infusions: . sodium chloride 500 mL (05/31/20 0955)  . feeding supplement (OSMOLITE 1.2 CAL) 1,000 mL (05/30/20 2150)  . levETIRAcetam 500 mg (05/31/20 0957)     LOS: 11 days    Time spent:    Erick Blinks, MD [facility]   If 7PM-7AM, please contact night-coverage [email or web address]  05/31/2020, 10:39 AM

## 2020-06-01 ENCOUNTER — Other Ambulatory Visit: Payer: Self-pay

## 2020-06-01 DIAGNOSIS — G9341 Metabolic encephalopathy: Secondary | ICD-10-CM | POA: Diagnosis not present

## 2020-06-01 DIAGNOSIS — I609 Nontraumatic subarachnoid hemorrhage, unspecified: Secondary | ICD-10-CM | POA: Diagnosis not present

## 2020-06-01 LAB — CORTISOL: Cortisol, Plasma: 18 ug/dL

## 2020-06-01 LAB — BASIC METABOLIC PANEL
Anion gap: 8 (ref 5–15)
BUN: 27 mg/dL — ABNORMAL HIGH (ref 8–23)
CO2: 21 mmol/L — ABNORMAL LOW (ref 22–32)
Calcium: 8.1 mg/dL — ABNORMAL LOW (ref 8.9–10.3)
Chloride: 97 mmol/L — ABNORMAL LOW (ref 98–111)
Creatinine, Ser: 0.35 mg/dL — ABNORMAL LOW (ref 0.44–1.00)
GFR, Estimated: >60 mL/min (ref >60)
Glucose, Bld: 107 mg/dL — ABNORMAL HIGH (ref 70–99)
Potassium: 3.7 mmol/L (ref 3.5–5.1)
Sodium: 126 mmol/L — ABNORMAL LOW (ref 135–145)

## 2020-06-01 LAB — GLUCOSE, CAPILLARY
Glucose-Capillary: 104 mg/dL — ABNORMAL HIGH (ref 70–99)
Glucose-Capillary: 102 mg/dL — ABNORMAL HIGH (ref 70–99)
Glucose-Capillary: 128 mg/dL — ABNORMAL HIGH (ref 70–99)
Glucose-Capillary: 118 mg/dL — ABNORMAL HIGH (ref 70–99)
Glucose-Capillary: 100 mg/dL — ABNORMAL HIGH (ref 70–99)
Glucose-Capillary: 138 mg/dL — ABNORMAL HIGH (ref 70–99)

## 2020-06-01 LAB — CBC
HCT: 31.8 % — ABNORMAL LOW (ref 36.0–46.0)
Hemoglobin: 10.9 g/dL — ABNORMAL LOW (ref 12.0–15.0)
MCH: 31.2 pg (ref 26.0–34.0)
MCHC: 34.3 g/dL (ref 30.0–36.0)
MCV: 91.1 fL (ref 80.0–100.0)
Platelets: 218 10*3/uL (ref 150–400)
RBC: 3.49 MIL/uL — ABNORMAL LOW (ref 3.87–5.11)
RDW: 13.9 % (ref 11.5–15.5)
WBC: 8.6 10*3/uL (ref 4.0–10.5)
nRBC: 0 % (ref 0.0–0.2)

## 2020-06-01 LAB — SODIUM
Sodium: 128 mmol/L — ABNORMAL LOW (ref 135–145)
Sodium: 128 mmol/L — ABNORMAL LOW (ref 135–145)
Sodium: 128 mmol/L — ABNORMAL LOW (ref 135–145)
Sodium: 132 mmol/L — ABNORMAL LOW (ref 135–145)

## 2020-06-01 LAB — PHOSPHORUS: Phosphorus: 3.4 mg/dL (ref 2.5–4.6)

## 2020-06-01 LAB — MAGNESIUM: Magnesium: 1.8 mg/dL (ref 1.7–2.4)

## 2020-06-01 MED ORDER — SODIUM CHLORIDE 3 % IN NEBU
4.0000 mL | INHALATION_SOLUTION | Freq: Two times a day (BID) | RESPIRATORY_TRACT | Status: DC
  Administered 2020-06-01 – 2020-06-02 (×3): 4 mL via RESPIRATORY_TRACT
  Filled 2020-06-01 (×3): qty 4

## 2020-06-01 MED ORDER — MODAFINIL 100 MG PO TABS
100.0000 mg | ORAL_TABLET | Freq: Every day | ORAL | Status: DC
  Administered 2020-06-01 – 2020-06-03 (×3): 100 mg
  Filled 2020-06-01 (×3): qty 1

## 2020-06-01 MED ORDER — THIAMINE HCL 100 MG PO TABS
100.0000 mg | ORAL_TABLET | Freq: Every day | ORAL | Status: DC
  Administered 2020-06-03: 100 mg
  Filled 2020-06-01: qty 1

## 2020-06-01 MED ORDER — THIAMINE HCL 100 MG/ML IJ SOLN
500.0000 mg | Freq: Three times a day (TID) | INTRAVENOUS | Status: AC
  Administered 2020-06-01 – 2020-06-02 (×6): 500 mg via INTRAVENOUS
  Filled 2020-06-01 (×6): qty 5

## 2020-06-01 MED ORDER — LEVETIRACETAM 100 MG/ML PO SOLN
500.0000 mg | Freq: Two times a day (BID) | ORAL | Status: DC
  Administered 2020-06-01 – 2020-06-04 (×7): 500 mg
  Filled 2020-06-01 (×7): qty 5

## 2020-06-01 MED ORDER — POTASSIUM CHLORIDE CRYS ER 20 MEQ PO TBCR: 40.0000 meq | EXTENDED_RELEASE_TABLET | Freq: Two times a day (BID) | ORAL | Status: DC

## 2020-06-01 MED ORDER — MAGNESIUM SULFATE 2 GM/50ML IV SOLN
2.0000 g | Freq: Once | INTRAVENOUS | Status: AC
  Administered 2020-06-01: 2 g via INTRAVENOUS
  Filled 2020-06-01: qty 50

## 2020-06-01 MED ORDER — MODAFINIL 100 MG PO TABS: 100.0000 mg | ORAL_TABLET | Freq: Every day | ORAL | Status: DC

## 2020-06-01 MED ORDER — POTASSIUM CHLORIDE 20 MEQ PO PACK
40.0000 meq | PACK | Freq: Two times a day (BID) | ORAL | Status: AC
  Administered 2020-06-01 (×2): 40 meq
  Filled 2020-06-01 (×2): qty 2

## 2020-06-01 MED ORDER — FOLIC ACID 1 MG PO TABS
1.0000 mg | ORAL_TABLET | Freq: Every day | ORAL | Status: DC
  Administered 2020-06-01 – 2020-06-03 (×3): 1 mg
  Filled 2020-06-01 (×3): qty 1

## 2020-06-01 MED ORDER — SODIUM CHLORIDE 3 % IV SOLN
INTRAVENOUS | Status: DC
  Filled 2020-06-01 (×3): qty 500

## 2020-06-01 NOTE — Progress Notes (Signed)
  NEUROSURGERY PROGRESS NOTE   Pt seen and examined. No issues overnight.   EXAM: Temp:  [98 F (36.7 C)-98.4 F (36.9 C)] 98 F (36.7 C) (03/22 0800) Pulse Rate:  [63-100] 87 (03/22 1036) Resp:  [17-38] 22 (03/22 1036) BP: (107-168)/(55-92) 127/81 (03/22 1029) SpO2:  [93 %-100 %] 96 % (03/22 1029) Weight:  [42.1 kg] 42.1 kg (03/22 0500) Intake/Output      03/21 0701 03/22 0700 03/22 0701 03/23 0700   P.O.     I.V. (mL/kg) 1418.2 (33.7) 212.2 (5)   NG/GT 3097.5    IV Piggyback 351.9    Total Intake(mL/kg) 4867.6 (115.6) 212.2 (5)   Urine (mL/kg/hr) 1800 (1.8)    Stool 0    Total Output 1800    Net +3067.6 +212.2        Urine Occurrence 1 x    Stool Occurrence 2 x     Somnolent, will occasionally answer questions with single-word responses Will nod to questions No eye opening Purposeful with BUE/BLE, will intermittently follow commands  LABS: Lab Results  Component Value Date   CREATININE 0.35 (L) 06/01/2020   BUN 27 (H) 06/01/2020   NA 128 (L) 06/01/2020   K 3.7 06/01/2020   CL 97 (L) 06/01/2020   CO2 21 (L) 06/01/2020   Lab Results  Component Value Date   WBC 8.6 06/01/2020   HGB 10.9 (L) 06/01/2020   HCT 31.8 (L) 06/01/2020   MCV 91.1 06/01/2020   PLT 218 06/01/2020   TCD: Reviewed and are largely normal. Not c/w spasm  IMPRESSION: - 70 y.o. female SAH d#13 s/p RPcom coiling, obtunded likely due to hyponatremia and UTI. Has stable ventriculomegaly.  PLAN: - cont abx for UTI - add hypertonic saline - Cont Keppra - Cont to monitor neurologic exam  Lisbeth Renshaw, MD [facility]

## 2020-06-01 NOTE — Progress Notes (Signed)
Nutrition Follow-up  DOCUMENTATION CODES:   Severe malnutrition in context of chronic illness  INTERVENTION:   Continue tube feeding via Cortrak tube: Osmolite 1.2 at 55 ml/h (1320 ml per day) Prosource TF 45 ml daily   Provides 1624 kcal, 77 gm protein, 984 ml free water daily  MVI with minerals daily  Nutrisource Fiber BID  NUTRITION DIAGNOSIS:   Severe Malnutrition related to chronic illness (chronic abdominal pain) as evidenced by severe muscle depletion,severe fat depletion,per patient/family report,energy intake < or equal to 50% for > or equal to 1 month. Ongoing.   GOAL:   Patient will meet greater than or equal to 90% of their needs Met.   MONITOR:   TF tolerance,Labs  REASON FOR ASSESSMENT:   Consult Enteral/tube feeding initiation and management  ASSESSMENT:   70 yo female with a PMH of COPD and recent NSTEMI who presents with acute aneurysmal subarachnoid hemorrhage s/p coiling.  Pt discussed during ICU rounds and with RN.  Not opening eyes. Remains sleepy. Na remains low, hypertonic saline restarted.   3/10 - CT head: Subarachnoid hemorrhage; CT angiogram of head and neck: Ruptured right P-comm aneurysm 3/11 - CT head: Diffuse areas of high attenuation throughout the bilateral sulci, likely a combination of residual subarachnoid hemorrhage and postprocedural contrast staining; SLP rec'd Dys 1 diet with thins 3/14 - Cortrak placed - TF initiated; pt later pulled cortrak and NG/OG was replaced by nurse - TF restarted 3/16 - Cortrak placed; tip gastric  3/21 tx back to ICU for somnolence, EG negative, low Na    Medications reviewed and include: nutrisource fiber BID, MVI with minerals, Nimotop, KCl  Mag sulfate x 1  Hypertonic saline  Labs reviewed: Na 126     Diet Order:   Diet Order            Diet NPO time specified  Diet effective now                 EDUCATION NEEDS:   Not appropriate for education at this time  Skin:  Skin  Assessment: Reviewed RN Assessment  Last BM:  3/22 large; type 7  Height:   Ht Readings from Last 1 Encounters:  05/20/20 '5\' 4"'  (1.626 m)    Weight:   Wt Readings from Last 1 Encounters:  06/01/20 42.1 kg    Ideal Body Weight:  54.5 kg  BMI:  Body mass index is 15.93 kg/m.  Estimated Nutritional Needs:   Kcal:  1500-1700  Protein:  70-85 grams  Fluid:  >1.5 L  Shamar Engelmann P., RD, LDN, CNSC See AMiON for contact information

## 2020-06-01 NOTE — Progress Notes (Signed)
eLink Physician-Brief Progress Note Patient Name: Alexandra Mays DOB: 04/12/1950 MRN: [id number]   Date of Service  06/01/2020  HPI/Events of Note  Notified of Na 128 from 130 and 129 prior On LR 75 cc/hr. UO averaging 75 cc/hr Received a one time dose of 3% saline 50 cc earlier in the day  eICU Interventions  Increase LR to 100 cc/hr     Intervention Category Major Interventions: Electrolyte abnormality - evaluation and management  Rosalie Gums Ki Corbo 06/01/2020, 3:26 AM

## 2020-06-01 NOTE — Progress Notes (Signed)
NAME:  Alexandra Mays, MRN:  [id number], DOB:  14-Jul-1950, LOS: 12 ADMISSION DATE:  05/20/2020, CONSULTATION DATE: 05/21/2020 REFERRING MD: Dr. 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 Lives with granddaughter, but is able to drive, shop, and give childcare.   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   Significant Hospital Events:   Has remained in ICU on vasospasm watch.  TCD's have been normal although patient continues to be fairly somnolent. Follow-up CT scan 3/18 and MRI small age-indeterminate strokes but no convincing evidence of vasospasm 3/21 sent back to ICU for somnolence, EEG neg, stable ventriculomegaly on CT, UA + for infection, sodium low  Interim History / Subjective:  Still sleepy. Sodium drifted back down after hypertonic  Objective   Blood pressure  131/60, pulse 71, temperature 98.1 F (36.7 C), temperature source Axillary, resp. rate (!) 21, height 5\' 4"  (1.626 m), weight 42.1 kg, SpO2 94 %.        Intake/Output Summary (Last 24 hours) at 06/01/2020 0703 Last data filed at 06/01/2020 0600 Gross per 24 hour  Intake 4867.59 ml  Output 1800 ml  Net 3067.59 ml   Filed Weights   05/28/20 0400 05/31/20 0500 06/01/20 0500  Weight: 38.3 kg 41.9 kg 42.1 kg    Examination: Constitutional: fraily elderly woman lying in bed  Eyes: she will not let me open her eyes Ears, nose, mouth, and throat: MM dry, trachea midline Cardiovascular: RRR, ext warm Respiratory: retained pharyngeal secretions causing gurgling breath sounds, mildly tachypneic Gastrointestinal: soft, +BS Skin: No rashes, normal turgor Neurologic: withdraws x 4 except LLE which is a bit sluggish, follows commands intermittently, winces with repositioning Psychiatric: says "no" when I ask her to open her eyes, does not respond to anything  else   Resolved Hospital Problem list     Assessment & Plan:   Aneurysmal subarachnoid hemorrhage H/H 2, MF 3 due to ruptured P-comm aneurysm status post coiling Associated R thalamic infarct - Neuro checks, maintain SBP < 160 or per NSGY - PT/OT/SLP as able  Acute encephalopathy: some combination UTI, hyponatremia, hypoactive ICU delirium.  Less likely symptomatic hydrocephalus given stable imaging. 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 - HTS drip to get to >130 mEq/dL - Check coritsol - Trial of modafenil - If get sodium > 130 and 2+ days abx, should consider EVD but defer to NSGY  Ischemic chronic systolic congestive heart failure, EF 20 to 25% - home statin, losartan, carvedilol currently on hold.  - watch for s/s of fluid overload - will need to restart once BP can tolerate. Has been borderline  with nimodipine.   Pulmonary toileting issues leading to  acute hypoxemic respiratory failure - CPT and hypertonic  Malnutrition - Tube feeds ongoing  COPD, not in exacerbation - duonebs, budesonide nebs in place of home breo, incruse.  Dementia - mild at baseline. Still drives, cooks, childcare.   UTI  - Ceftriaxone, f/u urine culture  Best practice (evaluated daily)  Diet: Tube feeds Pain/Anxiety/Delirium protocol (if indicated): Tylenol PRN VAP protocol (if indicated): N/A DVT prophylaxis: SCDs GI prophylaxis: Famotidine Glucose control: SSI Mobility: As tolerated Disposition: ICU as high risk for intubation  Goals of Care:  Last date of multidisciplinary goals of care discussion: 3/22 with grandaughter, nurse, NP, and myself present Code Status: Full code. Patient would not want long term life support. But a time limited trial on intubation would be ok if indicated.   Patient critically ill due to acute metabolic encephalopathy, hypoxemic respiratory failure Interventions to address this today CPT, hypertonic saline, intubation watch Risk of deterioration without these interventions is high  I personally spent 45 minutes providing critical care not including any separately billable procedures  Myrla Halsted MD [facility]  Prefer epic messenger for cross cover needs If after hours, please call E-link

## 2020-06-01 NOTE — Progress Notes (Signed)
Physical Therapy Treatment Patient Details Name: Alexandra Mays MRN: [id number] DOB: 11-Jul-1950 Today's Date: 06/01/2020    History of Present Illness 70 y.o. female who presented 05/20/20 with R neck pain and a headache. 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 Pt recently discharged from hospital 05/16/20 with NSTEMI. 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 PMH: dyspnea, COPD, AAA, pure hyperglyceridemia.    PT Comments    Pt not progressing towards her physical therapy goals. Remains lethargic, will nod yes/no intermittently, but otherwise not following commands. Requiring two person maximal assist to stand from edge of bed x 3. Unable to take steps due to increased trunk flexion and poor standing balance. Updated d/c plan in light of regression and decreased activity tolerance.    Follow Up Recommendations  Supervision/Assistance - 24 hour;SNF     Equipment Recommendations  3in1 (PT);Wheelchair (measurements PT);Wheelchair cushion (measurements PT)    Recommendations for Other Services       Precautions / Restrictions Precautions Precautions: Fall Precaution Comments: NGT, mittens Restrictions Weight Bearing Restrictions: No    Mobility  Bed Mobility Overal bed mobility: Needs Assistance Bed Mobility: Supine to Sit;Sit to Supine     Supine to sit: Max assist;+2 for physical assistance Sit to supine: Max assist;+2 for physical assistance   General bed mobility comments: Decreased initiation, maxA +2  for all aspects of bed mobility    Transfers Overall transfer level: Needs assistance Equipment used: None Transfers: Sit to/from Stand Sit to Stand: Max assist;+2 physical assistance         General transfer comment: MaxA for standing from edge of bed x 3 , increased trunk flexion/crouched posture  Ambulation/Gait                  Stairs             Wheelchair Mobility    Modified Rankin (Stroke Patients Only) Modified Rankin (Stroke Patients Only) Pre-Morbid Rankin Score: No significant disability Modified Rankin: Severe disability     Balance Overall balance assessment: Needs assistance Sitting-balance support: Bilateral upper extremity supported;Feet supported Sitting balance-Leahy Scale: Poor Sitting balance - Comments: Anterior tilt   Standing balance support: Bilateral upper extremity supported;During functional activity Standing balance-Leahy Scale: Poor                              Cognition Arousal/Alertness: Awake/alert Behavior During Therapy: Flat affect Overall Cognitive Status: History of cognitive impairments - at baseline Area of Impairment: Following commands                       Following Commands: Follows one step commands inconsistently       General Comments: Pt with eyes closed throughout, able to nod yes/no to several questions but otherwise not following commands      Exercises      General Comments  VSS      Pertinent Vitals/Pain Pain Assessment: No/denies pain    Home Living                      Prior Function            PT Goals (current goals can now be found in the care plan section) Acute Rehab PT Goals Patient Stated Goal: to improve Potential to Achieve Goals: Fair Progress  towards PT goals: Not progressing toward goals - comment    Frequency    Min 3X/week      PT Plan Discharge plan needs to be updated;Frequency needs to be updated    Co-evaluation              AM-PAC PT "6 Clicks" Mobility   Outcome Measure  Help needed turning from your back to your side while in a flat bed without using bedrails?: Total Help needed moving from lying on your back to sitting on the side of a flat bed without using bedrails?: Total Help needed moving to and from a bed to a chair (including a  wheelchair)?: Total Help needed standing up from a chair using your arms (e.g., wheelchair or bedside chair)?: Total Help needed to walk in hospital room?: Total Help needed climbing 3-5 steps with a railing? : Total 6 Click Score: 6    End of Session Equipment Utilized During Treatment: Gait belt Activity Tolerance: Patient limited by lethargy Patient left: in bed;with call bell/phone within reach;with bed alarm set Nurse Communication: Mobility status PT Visit Diagnosis: Unsteadiness on feet (R26.81);Other abnormalities of gait and mobility (R26.89);Muscle weakness (generalized) (M62.81);Difficulty in walking, not elsewhere classified (R26.2);Other symptoms and signs involving the nervous system (R29.898)     Time: 3546-5681 PT Time Calculation (min) (ACUTE ONLY): 17 min  Charges:  $Therapeutic Activity: 8-22 mins                     Lillia Pauls, PT, DPT Acute Rehabilitation Services Pager [phone] Office [phone]    [name] 06/01/2020, 2:17 PM

## 2020-06-01 NOTE — NC FL2 (Signed)
[place] MEDICAID FL2 LEVEL OF CARE SCREENING TOOL     IDENTIFICATION  Patient Name: Alexandra Mays Birthdate: [birth year] Sex: female Admission Date (Current Location): 05/20/2020  [facility] and [place] Number:  Producer, television/film/video and Address:  [facility], [address] [place], [place] [address]      Provider Number: [id number]  Attending Physician Name and Address:  Lorin Glass, MD  Relative Name and Phone Number:  [name], daughter, [phone]    Current Level of Care: Hospital Recommended Level of Care: Skilled Nursing Facility Prior Approval Number:    Date Approved/Denied:   PASRR Number: [id number]  Discharge Plan: SNF    Current Diagnoses: Patient Active Problem List   Diagnosis Date Noted  . SAH (subarachnoid hemorrhage) (HCC)   . Acute metabolic encephalopathy   . Posterior communicating artery aneurysm   . COPD (chronic obstructive pulmonary disease) (HCC)   . Dyspnea   . Pure hyperglyceridemia   . Protein-calorie malnutrition, severe 05/25/2020  . Subarachnoid hemorrhage due to cerebral aneurysm (HCC) 05/20/2020  . Acute respiratory failure with hypoxia (HCC) 05/14/2020  . Essential hypertension 05/14/2020  . Leukocytosis 05/14/2020  . Hyperlipemia 05/14/2020  . Tobacco abuse 05/14/2020  . Stress-induced cardiomyopathy   . NSTEMI (non-ST elevated myocardial infarction) (HCC) 05/13/2020  . S/P aortic aneurysm repair 07/24/2016  . AAA (abdominal aortic aneurysm) (HCC) 07/24/2016  . Abdominal aortic aneurysm (AAA) without rupture (HCC) 04/21/2015  . Cigarette smoker 03/29/2015  . 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 Dyslipidemia 03/29/2015  . Mild cognitive impairment with memory loss 02/17/2015  . Depressive disorder 02/17/2015    Orientation RESPIRATION BLADDER Height & Weight     Self  Normal Incontinent,Indwelling catheter Weight: 92 lb 13 oz (42.1 kg) Height:  5\' 4"  (162.6 cm)   BEHAVIORAL SYMPTOMS/MOOD NEUROLOGICAL BOWEL NUTRITION STATUS      Incontinent Diet (Please see DC Summary)  AMBULATORY STATUS COMMUNICATION OF NEEDS Skin   Extensive Assist Verbally Normal                       Personal Care Assistance Level of Assistance  Bathing,Feeding,Dressing Bathing Assistance: Maximum assistance Feeding assistance: Maximum assistance Dressing Assistance: Maximum assistance     Functional Limitations Info  Hearing   Hearing Info: Impaired      SPECIAL CARE FACTORS FREQUENCY  PT (By licensed PT),OT (By licensed OT)     PT Frequency: 5x/week OT Frequency: 5x/week            Contractures Contractures Info: Not present    Additional Factors Info  Code Status,Allergies Code Status Info: Full Allergies Info: NKA           Current Medications (06/01/2020):  This is the current hospital active medication list Current Facility-Administered Medications  Medication Dose Route Frequency Provider Last Rate Last Admin  . 0.9 %  sodium chloride infusion   Intravenous PRN 06/03/2020, MD   Stopped at 06/01/20 1033  . acetaminophen (TYLENOL) tablet 650 mg  650 mg Oral Q4H PRN 06/03/20, MD       Or  . acetaminophen (TYLENOL) 160 MG/5ML solution 650 mg  650 mg Per Tube Q4H PRN Coletta Memos, MD   650 mg at 06/01/20 06/03/20   Or  . acetaminophen (TYLENOL) suppository 650 mg  650 mg Rectal Q4H PRN 5176, MD      . albuterol (PROVENTIL) (2.5 MG/3ML) 0.083% nebulizer solution 2.5 mg  2.5 mg Nebulization Q6H PRN Coletta Memos, MD      . bethanechol (URECHOLINE) tablet 10 mg  10 mg Per Tube TID Lynnell Catalan, MD   10 mg at 06/01/20 0907  . cefTRIAXone (ROCEPHIN) 2 g in sodium chloride 0.9 % 100 mL IVPB  2 g Intravenous Q24H Lorin Glass, MD 200 mL/hr at 06/01/20 1413 2 g at 06/01/20 1413  . chlorhexidine (PERIDEX) 0.12 % solution 15 mL  15 mL Mouth Rinse BID Lisbeth Renshaw, MD   15 mL at 06/01/20 0910  . Chlorhexidine Gluconate Cloth  2 % PADS 6 each  6 each Topical Daily Lisbeth Renshaw, MD   6 each at 06/01/20 1223  . feeding supplement (OSMOLITE 1.2 CAL) liquid 1,000 mL  1,000 mL Per Tube Continuous Lorin Glass, MD 55 mL/hr at 06/01/20 0815 1,000 mL at 06/01/20 0815  . feeding supplement (PROSource TF) liquid 45 mL  45 mL Per Tube Daily Lynnell Catalan, MD   45 mL at 06/01/20 0907  . fiber (NUTRISOURCE FIBER) 1 packet  1 packet Per Tube BID Lynnell Catalan, MD   1 packet at 06/01/20 [phone]  . folic acid (FOLVITE) tablet 1 mg  1 mg Per Tube Daily Lorin Glass, MD   1 mg at 06/01/20 1223  . heparin injection 5,000 Units  5,000 Units Subcutaneous Q8H Jadene Pierini, MD   5,000 Units at 06/01/20 1412  . ipratropium-albuterol (DUONEB) 0.5-2.5 (3) MG/3ML nebulizer solution 3 mL  3 mL Nebulization Q6H Duayne Cal, NP   3 mL at 06/01/20 1761  . levETIRAcetam (KEPPRA) 100 MG/ML solution 500 mg  500 mg Per Tube BID Lorin Glass, MD   500 mg at 06/01/20 6073  . MEDLINE mouth rinse  15 mL Mouth Rinse q12n4p Lisbeth Renshaw, MD   15 mL at 06/01/20 1223  . melatonin tablet 3 mg  3 mg Per Tube QHS Lisbeth Renshaw, MD   3 mg at 05/31/20 2124  . modafinil (PROVIGIL) tablet 100 mg  100 mg Per Tube Daily Lorin Glass, MD   100 mg at 06/01/20 7106  . multivitamin with minerals tablet 1 tablet  1 tablet Per Tube Daily Lynnell Catalan, MD   1 tablet at 06/01/20 0907  . niMODipine (NIMOTOP) capsule 60 mg  60 mg Oral Q4H Coletta Memos, MD   60 mg at 05/31/20 0527   Or  . niMODipine (NYMALIZE) 6 MG/ML oral solution 60 mg  60 mg Per Tube Q4H Coletta Memos, MD   60 mg at 06/01/20 1412  . ondansetron (ZOFRAN-ODT) disintegrating tablet 4 mg  4 mg Oral Q6H PRN Coletta Memos, MD       Or  . ondansetron (ZOFRAN) injection 4 mg  4 mg Intravenous Q6H PRN Coletta Memos, MD   4 mg at 05/21/20 1038  . pantoprazole sodium (PROTONIX) 40 mg/20 mL oral suspension 40 mg  40 mg Per Tube Daily Lisbeth Renshaw, MD   40 mg at 06/01/20 0907   . phenol (CHLORASEPTIC) mouth spray 1 spray  1 spray Mouth/Throat PRN Agarwala, Ravi, MD      . polyethylene glycol (MIRALAX / GLYCOLAX) packet 17 g  17 g Per Tube Daily PRN Lisbeth Renshaw, MD      . potassium chloride (KLOR-CON) packet 40 mEq  40 mEq Per Tube BID Lorin Glass, MD   40 mEq at 06/01/20 2694  . simvastatin (ZOCOR) tablet 20 mg  20 mg Per Tube Daily Lisbeth Renshaw, MD  20 mg at 06/01/20 0907  . sodium chloride (hypertonic) 3 % solution   Intravenous Continuous Lorin Glass, MD 50 mL/hr at 06/01/20 1400 Infusion Verify at 06/01/20 1400  . sodium chloride HYPERTONIC 3 % nebulizer solution 4 mL  4 mL Nebulization BID Lorin Glass, MD      . sodium chloride tablet 2 g  2 g Per Tube BID WC Lisbeth Renshaw, MD   2 g at 06/01/20 2585  . thiamine 500mg  in normal saline (34ml) IVPB  500 mg Intravenous TID 45m, MD   Stopped at 06/01/20 1021   Followed by  . [START ON 06/03/2020] thiamine tablet 100 mg  100 mg Per Tube Daily 06/05/2020, MD         Discharge Medications: Please see discharge summary for a list of discharge medications.  Relevant Imaging Results:  Relevant Lab Results:   Additional Information SS#: Lorin Glass. Moderna COVID-19 Vaccine 06/09/2019 , 05/12/2019  07/12/2019, LCSW

## 2020-06-01 NOTE — Progress Notes (Signed)
CSW received notification from therapy that patient will require SNF rather than CIR. CSW will follow for medical readiness, including removal of cortrak.   Nadia Rayyan LCSW

## 2020-06-02 ENCOUNTER — Inpatient Hospital Stay (HOSPITAL_COMMUNITY): Payer: Medicare Other

## 2020-06-02 DIAGNOSIS — I609 Nontraumatic subarachnoid hemorrhage, unspecified: Secondary | ICD-10-CM

## 2020-06-02 DIAGNOSIS — G9341 Metabolic encephalopathy: Secondary | ICD-10-CM | POA: Diagnosis not present

## 2020-06-02 LAB — CBC
HCT: 29.8 % — ABNORMAL LOW (ref 36.0–46.0)
Hemoglobin: 10 g/dL — ABNORMAL LOW (ref 12.0–15.0)
MCH: 31.6 pg (ref 26.0–34.0)
MCHC: 33.6 g/dL (ref 30.0–36.0)
MCV: 94.3 fL (ref 80.0–100.0)
Platelets: 208 10*3/uL (ref 150–400)
RBC: 3.16 MIL/uL — ABNORMAL LOW (ref 3.87–5.11)
RDW: 14.4 % (ref 11.5–15.5)
WBC: 7.7 10*3/uL (ref 4.0–10.5)
nRBC: 0 % (ref 0.0–0.2)

## 2020-06-02 LAB — SODIUM
Sodium: 136 mmol/L (ref 135–145)
Sodium: 136 mmol/L (ref 135–145)
Sodium: 136 mmol/L (ref 135–145)
Sodium: 136 mmol/L (ref 135–145)

## 2020-06-02 LAB — PHOSPHORUS: Phosphorus: 2.9 mg/dL (ref 2.5–4.6)

## 2020-06-02 LAB — MAGNESIUM: Magnesium: 2 mg/dL (ref 1.7–2.4)

## 2020-06-02 LAB — BASIC METABOLIC PANEL
Anion gap: 6 (ref 5–15)
BUN: 29 mg/dL — ABNORMAL HIGH (ref 8–23)
CO2: 21 mmol/L — ABNORMAL LOW (ref 22–32)
Calcium: 8.3 mg/dL — ABNORMAL LOW (ref 8.9–10.3)
Chloride: 111 mmol/L (ref 98–111)
Creatinine, Ser: 0.51 mg/dL (ref 0.44–1.00)
GFR, Estimated: >60 mL/min (ref >60)
Glucose, Bld: 121 mg/dL — ABNORMAL HIGH (ref 70–99)
Potassium: 4.2 mmol/L (ref 3.5–5.1)
Sodium: 138 mmol/L (ref 135–145)

## 2020-06-02 LAB — GLUCOSE, CAPILLARY
Glucose-Capillary: 126 mg/dL — ABNORMAL HIGH (ref 70–99)
Glucose-Capillary: 100 mg/dL — ABNORMAL HIGH (ref 70–99)
Glucose-Capillary: 146 mg/dL — ABNORMAL HIGH (ref 70–99)
Glucose-Capillary: 132 mg/dL — ABNORMAL HIGH (ref 70–99)
Glucose-Capillary: 119 mg/dL — ABNORMAL HIGH (ref 70–99)
Glucose-Capillary: 128 mg/dL — ABNORMAL HIGH (ref 70–99)

## 2020-06-02 MED ORDER — ARFORMOTEROL TARTRATE 15 MCG/2ML IN NEBU
15.0000 ug | INHALATION_SOLUTION | Freq: Two times a day (BID) | RESPIRATORY_TRACT | Status: DC
  Administered 2020-06-02 – 2020-06-04 (×5): 15 ug via RESPIRATORY_TRACT
  Filled 2020-06-02 (×7): qty 2

## 2020-06-02 NOTE — Progress Notes (Signed)
  Speech Language Pathology Treatment: Dysphagia;Cognitive-Linquistic  Patient Details Name: Alexandra Mays MRN: [id number] DOB: May 28, 1950 Today's Date: 06/02/2020 Time: 1856-3149 SLP Time Calculation (min) (ACUTE ONLY): 21 min  Assessment / Plan / Recommendation Clinical Impression  Pt more responsive today, answering questions with eyes closed and seems to be having delirium. Therapist removed large mucous clot from hard palate with trace bleeding at the site. Has been having throat pain since Friday per granddaughter. Orally held ice chip until melted however small volume did not trigger swallow and delayed swallow achieved with command. Oral care is vital, several ice chips in upright position when adequately alert to moisten mucosa.    HPI HPI: 70 y.o. 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 PMHx recent NSTEMI, AAA, COPD. Lives with granddaughter [name]. Dx'd with memory disorder five years ago (needs reminders re: appts, meds; [name] leaves sticky notes with reminders around the house) but still drives, picks grandchildren up from school      SLP Plan  Continue with current plan of care       Recommendations  Diet recommendations: NPO;Other(comment) (ice chip if awake) Medication Administration: Via alternative means                Oral Care Recommendations: Oral care QID Follow up Recommendations: Skilled Nursing facility SLP Visit Diagnosis: Dysphagia, unspecified (R13.10);Cognitive communication deficit (F02.637) Plan: Continue with current plan of care                      Royce Macadamia 06/02/2020, 11:39 AM  Breck Coons Lonell Face.Ed Nurse, children's [phone] Office [phone]

## 2020-06-02 NOTE — Progress Notes (Signed)
Inpatient Rehabilitation Admissions Coordinator  Noted form therapy that SNF now recommended. I will follow her progress from a distance.  Ottie Glazier, RN, MSN Rehab Admissions Coordinator [phone] 06/02/2020 8:14 AM

## 2020-06-02 NOTE — Progress Notes (Signed)
  NEUROSURGERY PROGRESS NOTE   No issues overnight. Much more verbal this am. Continually saying "Someone took my legs"  EXAM:  BP 127/62   Pulse 88   Temp 98.2 F (36.8 C) (Axillary)   Resp (!) 28   Ht 5\' 4"  (1.626 m)   Wt 42 kg   SpO2 96%   BMI 15.89 kg/m   Resting with eyes closed No eye opening. PERRL Answers yes and no although remains confused Follows commands with BUE and BLE this am  IMPRESSION/PLAN 70 y.o. female SAH d#14 s/p RPcom coiling. Mental status improving with tx of hyponatremia and UTI.  - continue abx for UTI per CCM. - Hyponatremia. Resolved. Continue to monitor - continue supportive care, keppra, nimotop  hopefully mental status continues to improve and patient can be reconsidered for CIR.

## 2020-06-02 NOTE — TOC Initial Note (Signed)
Transition of Care [facility]) - Initial/Assessment Note    Patient Details  Name: Alexandra Mays MRN: [id number] Date of Birth: January 14, 1951  [facility] CM/SW Contact:    Mearl Latin, LCSW Phone Number: 06/02/2020, 2:59 PM  Clinical Narrative:                 CSW received consult for possible SNF placement at time of discharge. CSW spoke with patient's granddaughter. Patient reported that patient lives with her. She expressed understanding of PT recommendation and may be agreeable to SNF placement at time of discharge but was hoping for CIR. CSW explained that at the moment, patient cannot tolerate CIR level therapies. CSW explained how home health worked and that family would need to provide the bulk of care. She would like time to think about the options as she knows patient would not want to go to SNF. CSW discussed insurance authorization process. Patient has received the Moderna COVID vaccines. CSW will follow for progression. No further questions reported at this time.    Expected Discharge Plan: Skilled Nursing Facility Barriers to Discharge: Continued Medical Work up   Patient Goals and CMS Choice Patient states their goals for this hospitalization and ongoing recovery are:: Rehab CMS Medicare.gov Compare Post Acute Care list provided to:: Patient Represenative (must comment) Choice offered to / list presented to :  (Granddaughter)  Expected Discharge Plan and Services Expected Discharge Plan: Skilled Nursing Facility In-house Referral: Clinical Social Work   Post Acute Care Choice: Skilled Nursing Facility Living arrangements for the past 2 months: Single Family Home                                      Prior Living Arrangements/Services Living arrangements for the past 2 months: Single Family Home Lives with:: Relatives Patient language and need for interpreter reviewed:: Yes Do you feel safe going back to the place where you live?: Yes      Need  for Family Participation in Patient Care: Yes (Comment) Care giver support system in place?: Yes (comment)   Criminal Activity/Legal Involvement Pertinent to Current Situation/Hospitalization: No - Comment as needed  Activities of Daily Living Home Assistive Devices/Equipment: Eyeglasses,Dentures (specify type) ADL Screening (condition at time of admission) Patient's cognitive ability adequate to safely complete daily activities?: No Is the patient deaf or have difficulty hearing?: No Does the patient have difficulty seeing, even when wearing glasses/contacts?: No Does the patient have difficulty concentrating, remembering, or making decisions?: Yes Patient able to express need for assistance with ADLs?: Yes Does the patient have difficulty dressing or bathing?: Yes Independently performs ADLs?: No Does the patient have difficulty walking or climbing stairs?: No Weakness of Legs: None Weakness of Arms/Hands: None  Permission Sought/Granted Permission sought to share information with : Facility Contact Representative,Family Supports Permission granted to share information with : No  Share Information with NAME: [name]  Permission granted to share info w AGENCY: SNFs  Permission granted to share info w Relationship: Granddaughter  Permission granted to share info w Contact Information: [phone]  Emotional Assessment Appearance:: Appears stated age Attitude/Demeanor/Rapport: Unable to Assess Affect (typically observed): Unable to Assess Orientation: : Oriented to Self Alcohol / Substance Use: Not Applicable Psych Involvement: No (comment)  Admission diagnosis:  SAH (subarachnoid hemorrhage) (HCC) [I60.9] Posterior communicating artery aneurysm [I67.1] Subarachnoid hemorrhage due to cerebral aneurysm [facility] [I60.8] Patient Active Problem List  Diagnosis Date Noted  . SAH (subarachnoid hemorrhage) (HCC)   . Acute metabolic encephalopathy   . Posterior communicating artery  aneurysm   . COPD (chronic obstructive pulmonary disease) (HCC)   . Dyspnea   . Pure hyperglyceridemia   . Protein-calorie malnutrition, severe 05/25/2020  . Subarachnoid hemorrhage due to cerebral aneurysm (HCC) 05/20/2020  . Acute respiratory failure with hypoxia (HCC) 05/14/2020  . Essential hypertension 05/14/2020  . Leukocytosis 05/14/2020  . Hyperlipemia 05/14/2020  . Tobacco abuse 05/14/2020  . Stress-induced cardiomyopathy   . NSTEMI (non-ST elevated myocardial infarction) (HCC) 05/13/2020  . S/P aortic aneurysm repair 07/24/2016  . AAA (abdominal aortic aneurysm) (HCC) 07/24/2016  . Abdominal aortic aneurysm (AAA) without rupture (HCC) 04/21/2015  . Cigarette smoker 03/29/2015  . 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 Dyslipidemia 03/29/2015  . Mild cognitive impairment with memory loss 02/17/2015  . Depressive disorder 02/17/2015   PCP:  Tanna Furry, PA-C Pharmacy:   [facility] [phone] - [address], [place] - [address] AT [facility] [address] [place] [address] Phone: [phone] Fax: [phone]     Social Determinants of Health (SDOH) Interventions    Readmission Risk Interventions No flowsheet data found.

## 2020-06-02 NOTE — Progress Notes (Signed)
Transcranial Doppler  Date POD PCO2 HCT BP  MCA ACA PCA OPHT SIPH VERT Basilar       Right  Left                                       3/14 MR     Right  Left   55  41   -52  -33   38  29   21  17   17  24    -23  -27   -24      3/16 GC     Right  Left   72  38   -18  -42   40  24   20  16   30  24    -20  -25   -20      3/18 MR      Right  Left   *  34   *  -39   21  21   25  25    40  *   -26  -26   -31      3/21RH     Right  Left   91  47   -28  -50   36  -30   21  20    -38  32   -26  -22   *      3/23,rs     Right  Left   144  187   -35  -77   73  72   36  42   58  63   -32  -40   *           Right  Left                                        MCA = Middle Cerebral Artery      OPHT = Opthalmic Artery     BASILAR = Basilar Artery   ACA = Anterior Cerebral Artery     SIPH = Carotid Siphon PCA = Posterior Cerebral Artery   VERT = Verterbral Artery                   Normal MCA = 62+\-12 ACA = 50+\-12 PCA = 42+\-23  * = Unable to insonate  RT [place] = 3.4, LT [place] = 3.5 Juliett Eastburn, BS, RDMS, RVT

## 2020-06-02 NOTE — Progress Notes (Signed)
NAME:  Alexandra Mays, MRN:  [id number], DOB:  Nov 22, 1950, LOS: 13 ADMISSION DATE:  05/20/2020, CONSULTATION DATE: 05/21/2020 REFERRING MD: Dr. 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 Lives with granddaughter, but is able to drive, shop, and give childcare.   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   Significant Hospital Events:   Has remained in ICU on vasospasm watch.  TCD's have been normal although patient continues to be fairly somnolent. Follow-up CT scan 3/18 and MRI small age-indeterminate strokes but no convincing evidence of vasospasm 3/21 sent back to ICU for somnolence, EEG neg, stable ventriculomegaly on CT, UA + for infection, sodium low  Interim History / Subjective:  More alert.  Following commands.  Still not very interactive.  Objective   Blood pressure  (!) 153/85, pulse 91, temperature 98.2 F (36.8 C), temperature source Axillary, resp. rate (!) 28, height 5\' 4"  (1.626 m), weight 42 kg, SpO2 96 %.        Intake/Output Summary (Last 24 hours) at 06/02/2020 06/04/2020 Last data filed at 06/02/2020 0800 Gross per 24 hour  Intake 2749.09 ml  Output 1825 ml  Net 924.09 ml   Filed Weights   05/31/20 0500 06/01/20 0500 06/02/20 0500  Weight: 41.9 kg 42.1 kg 42 kg    Examination: Constitutional: elderly woman in NAD  Eyes: pupils equal when she lets me open them Ears, nose, mouth, and throat: MM dry, trachea midline Cardiovascular: RRR ext warm Respiratory: less retained secretions, no accessory muscle use Gastrointestinal: soft, +BS Skin: No rashes, normal turgor Neurologic: moves all 4 ext to command Psychiatric: nodding head appropriately  Sodium improved CBC stable Sugars good  Resolved Hospital Problem list     Assessment & Plan:   Aneurysmal subarachnoid hemorrhage H/H 2, 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  Improved.  Per RN, we actually may be approaching how she was prior to transfer out of ICU; wonder if the hydrocephalus is playing a role.  For now would just continue supportive care and prevent hyponatremia as able. 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  Cortisol/TSH okay. - Continue modafenil - DC hypertonic saline - Will need to consider vaptan vs. Demeclocycline if sodium keeps drifting down  Ischemic chronic systolic congestive heart failure, EF 20 to 25% - home statin, losartan, carvedilol currently on hold.  - watch for s/s  of fluid overload; none at present - will need to restart once BP can tolerate.  Has been borderline with nimodipine.  Would do BB first   Pulmonary toileting issues leading to acute hypoxemic respiratory failure - CPT and hypertonic, doing better with this  Malnutrition - Tube feeds ongoing  COPD, not in exacerbation - duonebs, budesonide nebs in place of home breo, incruse.  Dementia - mild at baseline. Still drives, cooks, childcare.   UTI  - Ceftriaxone, f/u urine culture  Best practice (evaluated daily)  Diet: Tube feeds Pain/Anxiety/Delirium protocol (if indicated): Tylenol PRN VAP protocol (if indicated): N/A DVT prophylaxis: SCDs GI prophylaxis: Famotidine Glucose control: SSI Mobility: As tolerated Disposition: per primary, she may be able to go to stepdown soon  Goals of Care:  Last date of multidisciplinary goals of care discussion: 3/22 with grandaughter, nurse, NP, and myself present Code Status: Full code. Patient would not want long term life support. But a time limited trial on intubation would be ok if indicated.   Patient critically ill due to acute metabolic encephalopathy, hypoxemic respiratory failure Interventions to address this today hypertonic saline titration, aggressive pulmonary toileting Risk of deterioration without these interventions is high  I personally spent 35 minutes providing critical care not including any separately billable procedures  Myrla Halsted MD [facility]  Prefer epic messenger for cross cover needs If after hours, please call E-link

## 2020-06-02 NOTE — Progress Notes (Signed)
Occupational Therapy Treatment Patient Details Name: Alexandra Mays MRN: [id number] DOB: Jun 16, 1950 Today's Date: 06/02/2020    History of present illness Pt is a 70 y.o. female who presented 05/20/20 with R neck pain and a headache. 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 Pt recently discharged from hospital 05/16/20 with NSTEMI. Pt with a decline in mental status 3/18.  MRI showed no acute changes.  repeat CT 3/21 showed moderate edema in the Rt thalmus suspicious for lacunar infarct.  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 PMH: dyspnea, COPD, AAA, pure hyperglyceridemia.   OT comments  Pt is awake, and conversant.  Keeps eyes closed and very confused - oriented to her first name only.   She followed simple motor commands < 25% of the time.  She required max A for bed mobility, mod A to maintain EOB sitting, and total A for ADLs.  Due to limited ability to actively participate, recommendations changed from CIR to SNF.  Will continue to follow.   Follow Up Recommendations  SNF    Equipment Recommendations  None recommended by OT    Recommendations for Other Services      Precautions / Restrictions Precautions Precautions: Fall Precaution Comments: NGT, mittens       Mobility Bed Mobility Overal bed mobility: Needs Assistance Bed Mobility: Supine to Sit;Sit to Sidelying     Supine to sit: Max assist   Sit to sidelying: Max assist General bed mobility comments: pt required assist with all aspects    Transfers                 General transfer comment: unable to assess this date as pt resisting attempts    Balance Overall balance assessment: Needs assistance Sitting-balance support: Single extremity supported;Bilateral upper extremity supported;Feet unsupported Sitting balance-Leahy Scale: Poor Sitting balance - Comments: Pt  maintains trunk in flexion and sits leaned forward requiring mod A to maintain static sitting balance EOB.  Pt refused to move fully to EOB to place feet on floor                                   ADL either performed or assessed with clinical judgement   ADL Overall ADL's : Needs assistance/impaired Eating/Feeding: NPO   Grooming: Total assistance;Sitting Grooming Details (indicate cue type and reason): pt would not attempt to assist despite max cues                               General ADL Comments: total A.  Pt would not attempt     Vision       Perception     Praxis      Cognition Arousal/Alertness: Awake/alert Behavior During Therapy: Flat affect Overall Cognitive Status: Impaired/Different from baseline Area of Impairment: Orientation;Attention;Following commands;Memory;Safety/judgement;Awareness;Problem solving                 Orientation Level: Disoriented to;Person;Place;Time;Situation Current Attention Level: Focused Memory: Decreased short-term memory Following Commands: Follows one step commands inconsistently;Follows one step commands with increased time Safety/Judgement: Decreased awareness of deficits;Decreased awareness of safety   Problem Solving: Slow processing;Difficulty sequencing;Requires tactile cues General Comments: Pt keeps eyes closed throughout the session.  She knows her first name, but not her last name.  She doesn't  know where she is, why she is here, nor the time.  She followed simple one step commands ~10% of the time.  Pt confabulatory at times        Exercises     Shoulder Instructions       General Comments      Pertinent Vitals/ Pain       Pain Assessment: Faces Faces Pain Scale: Hurts little more Pain Location: back and generalized pain  Home Living                                          Prior Functioning/Environment              Frequency  Min 2X/week         Progress Toward Goals  OT Goals(current goals can now be found in the care plan section)  Progress towards OT goals: Not progressing toward goals - comment     Plan Discharge plan needs to be updated    Co-evaluation                 AM-PAC OT "6 Clicks" Daily Activity     Outcome Measure   Help from another person eating meals?: Total Help from another person taking care of personal grooming?: Total Help from another person toileting, which includes using toliet, bedpan, or urinal?: Total Help from another person bathing (including washing, rinsing, drying)?: Total Help from another person to put on and taking off regular upper body clothing?: Total Help from another person to put on and taking off regular lower body clothing?: Total 6 Click Score: 6    End of Session    OT Visit Diagnosis: Unsteadiness on feet (R26.81);Muscle weakness (generalized) (M62.81)   Activity Tolerance Other (comment) (cognition limited participation)   Patient Left in bed;with call bell/phone within reach;with bed alarm set   Nurse Communication Mobility status        Time: 4097-3532 OT Time Calculation (min): 16 min  Charges: OT General Charges $OT Visit: 1 Visit OT Treatments $Therapeutic Activity: 8-22 mins  Alexandra Mays., OTR/L Acute Rehabilitation Services Pager [phone] Office [phone]    [name] 06/02/2020, 5:17 PM

## 2020-06-03 ENCOUNTER — Inpatient Hospital Stay: Payer: Self-pay

## 2020-06-03 ENCOUNTER — Inpatient Hospital Stay (HOSPITAL_COMMUNITY): Payer: Medicare Other

## 2020-06-03 DIAGNOSIS — I609 Nontraumatic subarachnoid hemorrhage, unspecified: Secondary | ICD-10-CM | POA: Diagnosis not present

## 2020-06-03 LAB — BASIC METABOLIC PANEL
Anion gap: 7 (ref 5–15)
BUN: 24 mg/dL — ABNORMAL HIGH (ref 8–23)
CO2: 22 mmol/L (ref 22–32)
Calcium: 8.6 mg/dL — ABNORMAL LOW (ref 8.9–10.3)
Chloride: 107 mmol/L (ref 98–111)
Creatinine, Ser: 0.48 mg/dL (ref 0.44–1.00)
GFR, Estimated: >60 mL/min (ref >60)
Glucose, Bld: 106 mg/dL — ABNORMAL HIGH (ref 70–99)
Potassium: 3.8 mmol/L (ref 3.5–5.1)
Sodium: 136 mmol/L (ref 135–145)

## 2020-06-03 LAB — PHOSPHORUS: Phosphorus: 3.4 mg/dL (ref 2.5–4.6)

## 2020-06-03 LAB — GLUCOSE, CAPILLARY
Glucose-Capillary: 122 mg/dL — ABNORMAL HIGH (ref 70–99)
Glucose-Capillary: 132 mg/dL — ABNORMAL HIGH (ref 70–99)
Glucose-Capillary: 118 mg/dL — ABNORMAL HIGH (ref 70–99)
Glucose-Capillary: 174 mg/dL — ABNORMAL HIGH (ref 70–99)
Glucose-Capillary: 165 mg/dL — ABNORMAL HIGH (ref 70–99)

## 2020-06-03 LAB — SODIUM
Sodium: 134 mmol/L — ABNORMAL LOW (ref 135–145)
Sodium: 135 mmol/L (ref 135–145)
Sodium: 134 mmol/L — ABNORMAL LOW (ref 135–145)
Sodium: 132 mmol/L — ABNORMAL LOW (ref 135–145)
Sodium: 131 mmol/L — ABNORMAL LOW (ref 135–145)

## 2020-06-03 LAB — CBC
HCT: 28.4 % — ABNORMAL LOW (ref 36.0–46.0)
Hemoglobin: 9.3 g/dL — ABNORMAL LOW (ref 12.0–15.0)
MCH: 31.3 pg (ref 26.0–34.0)
MCHC: 32.7 g/dL (ref 30.0–36.0)
MCV: 95.6 fL (ref 80.0–100.0)
Platelets: 225 10*3/uL (ref 150–400)
RBC: 2.97 MIL/uL — ABNORMAL LOW (ref 3.87–5.11)
RDW: 15.3 % (ref 11.5–15.5)
WBC: 8 10*3/uL (ref 4.0–10.5)
nRBC: 0 % (ref 0.0–0.2)

## 2020-06-03 LAB — MAGNESIUM: Magnesium: 1.9 mg/dL (ref 1.7–2.4)

## 2020-06-03 IMAGING — CT CT ANGIO HEAD
4 of 13 series · 14 of 47 positions shown · IV contrast (omnipaque)
Comparison: [DATE]

CLINICAL DATA: Aneurysmal hemorrhage post coiling, possible
bilateral MCA spasm

EXAM:
CT ANGIOGRAPHY HEAD
TECHNIQUE: Multidetector CT imaging of the head was performed using the
standard protocol during bolus administration of intravenous
contrast. Multiplanar CT image reconstructions and MIPs were
obtained to evaluate the vascular anatomy.
CONTRAST:  75mL OMNIPAQUE IOHEXOL 350 MG/ML SOLN

[Series 6: head bone · axial · 0.41mm/px · z∈[+148,+238]mm · 4 of 77 slices shown]
[im 16/77  bone]
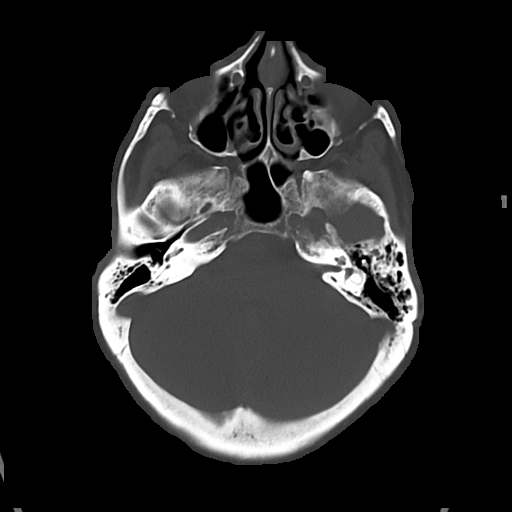
[im 31/77  bone]
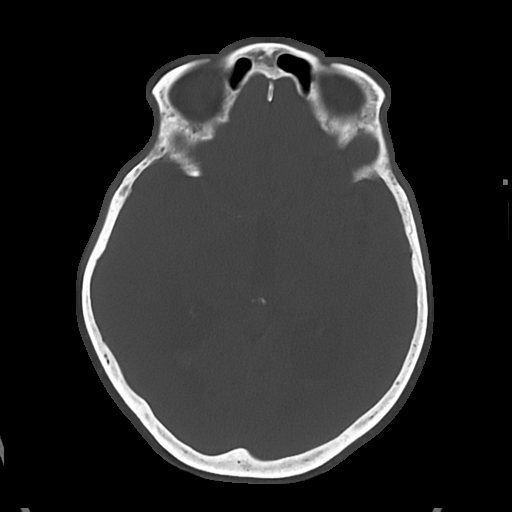
[im 46/77  bone]
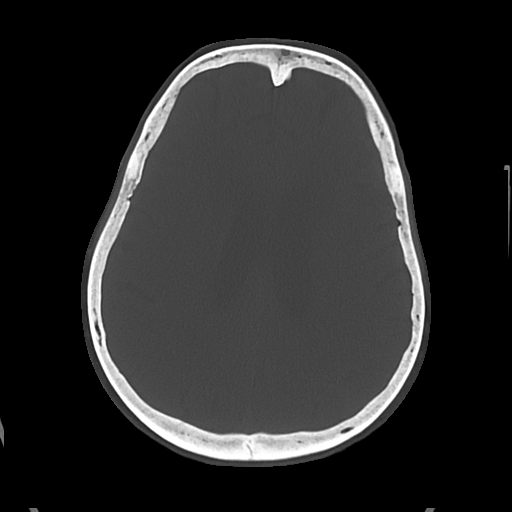
[im 61/77  bone]
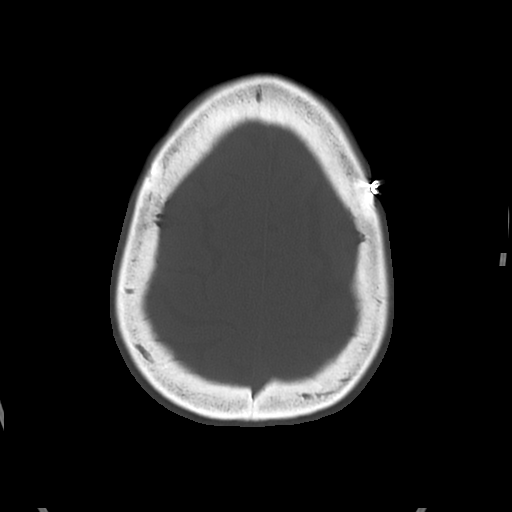

[Series 7: headangio 2.0 hr36 3 · axial · 0.41mm/px · z∈[+148,+232]mm · 4 of 71 slices shown]
[im 15/71  brain]
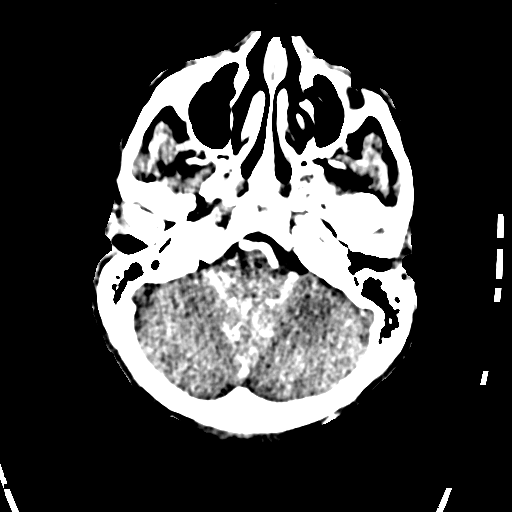
[im 29/71  bone]
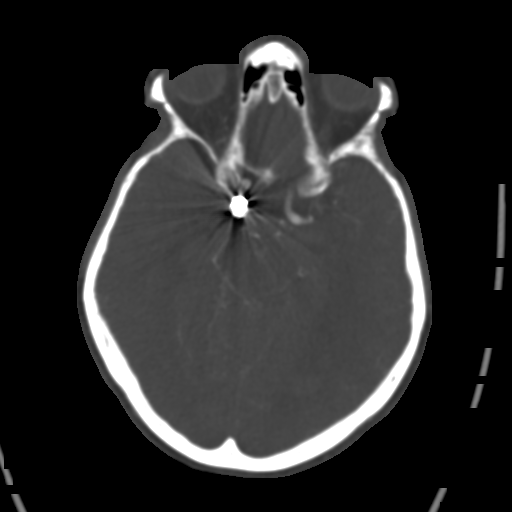
[im 43/71  brain]
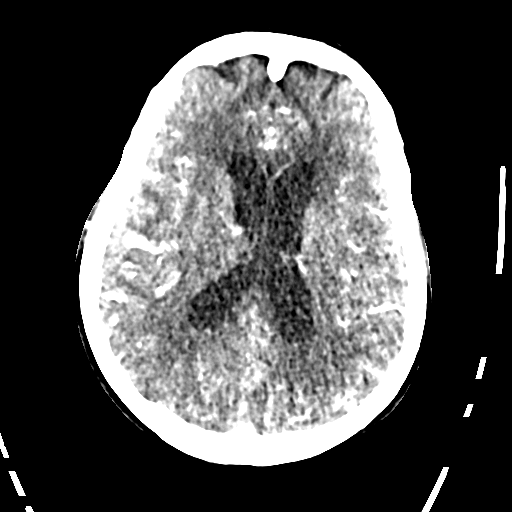
[im 57/71  bone]
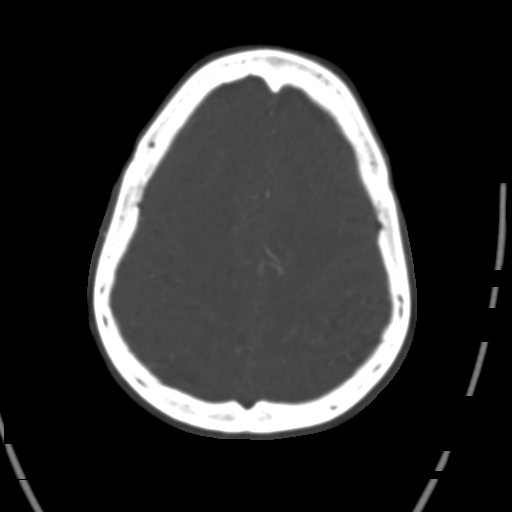

[Series 11: headangio 1.0 mpr cor · coronal · 0.29mm/px · 3 of 201 slices shown]
[im 67/201  brain]
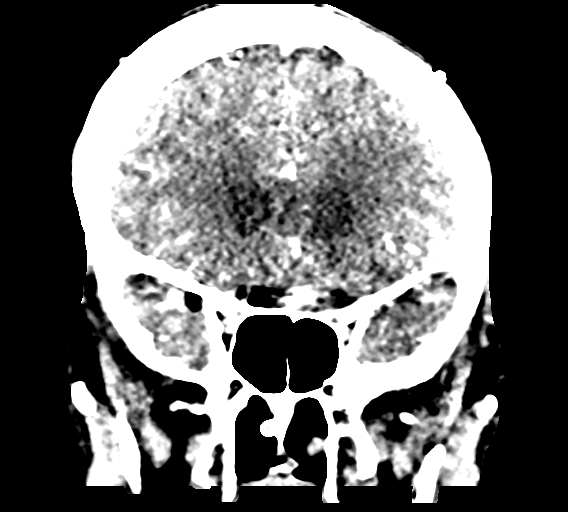
[im 101/201  brain]
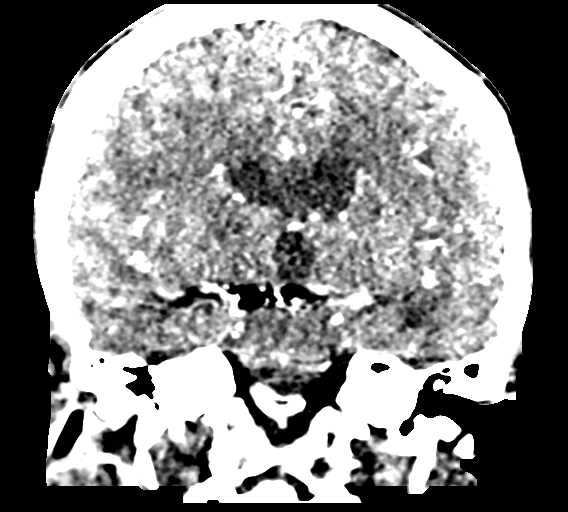
[im 134/201  brain]
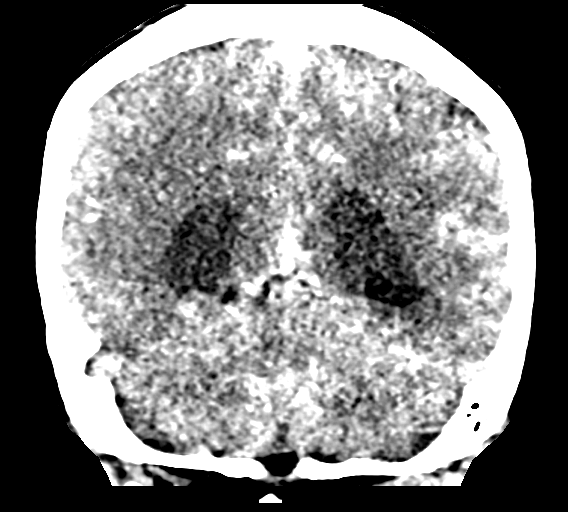

[Series 12: headangio 1.0 mpr sag · sagittal · 0.28mm/px · 3 of 168 slices shown]
[im 42/168  brain]
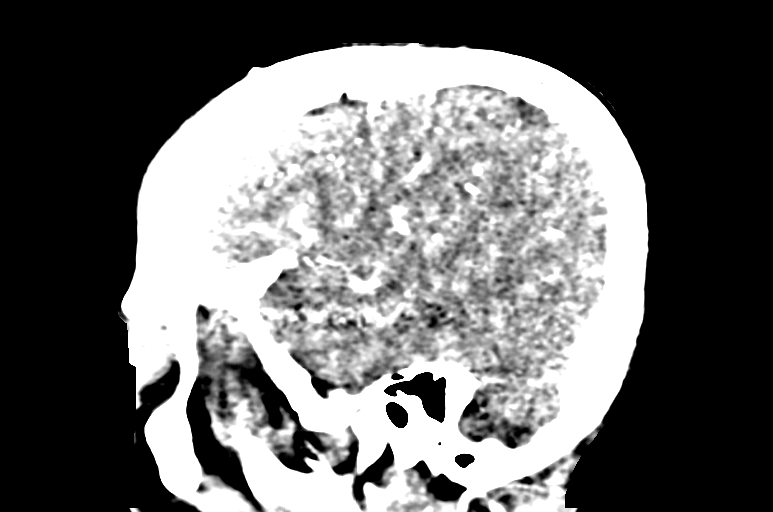
[im 84/168  brain]
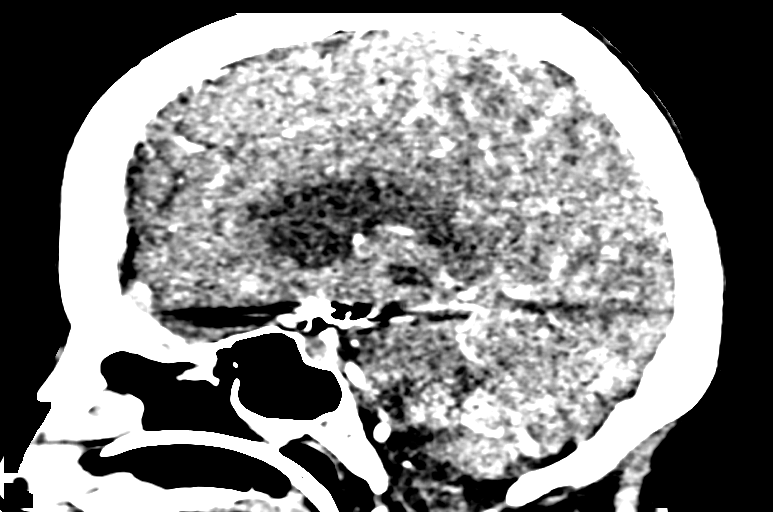
[im 126/168  brain]
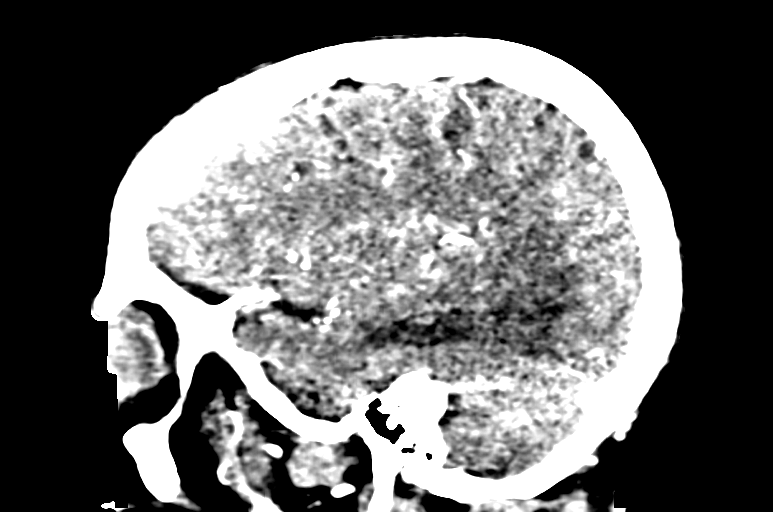

[14 of 47 positions shown; findings below may reference images not displayed]

FINDINGS: CT HEAD

Brain: Residual sulcal subarachnoid hemorrhage and layering
intraventricular hemorrhage. No new hemorrhage. Area of recent right
thalamocapsular infarction identified. No new loss gray-white
differentiation. Slightly increased ventricle caliber. Stable
findings of chronic microvascular ischemic changes.

Vascular: Right PCOM aneurysm coiling with associated streak
artifact.

Skull: No new findings.

Sinuses: No acute abnormality.

Orbits: No acute abnormality.

CTA HEAD

Anterior circulation: Intracranial internal carotid arteries are
patent with calcified plaque. Supraclinoid ICA is obscured on the
right by artifact from aneurysm coiling. Distal right M1 MCA is
moderately decreased in caliber. Mild narrowing of proximal right M2
MCA branches. Right A1 ACA is obscured. Left A1 ACA is patent. Left
middle cerebral artery is patent. Mildly decreased caliber of
proximal left M2 MCA branches.

Posterior circulation: Intracranial vertebral arteries and basilar
artery are patent. Posterior cerebral arteries are partially
obscured by artifact, right greater than left but patent. Possible
mild right P2 PCA narrowing.

Venous sinuses: As permitted by contrast timing, patent.
IMPRESSION: Artifact associated with aneurysm coiling, which may be exaggerating
following findings suggesting vasospasm: Moderate narrowing of the
distal right M1 MCA. Mild narrowing of bilateral proximal M2 MCA
branches. Possible mild right P2 PCA narrowing.

Residual sulcal subarachnoid hemorrhage and intraventricular
hemorrhage.

Slightly increased ventricle caliber.

Evolving recent right thalamocapsular infarction.

## 2020-06-03 IMAGING — DX DG CHEST 1V
1 series · 1 of 1 positions shown · non-contrast
Comparison: [DATE].

CLINICAL DATA: Shortness of breath.

EXAM:
CHEST  1 VIEW

[chest ap]
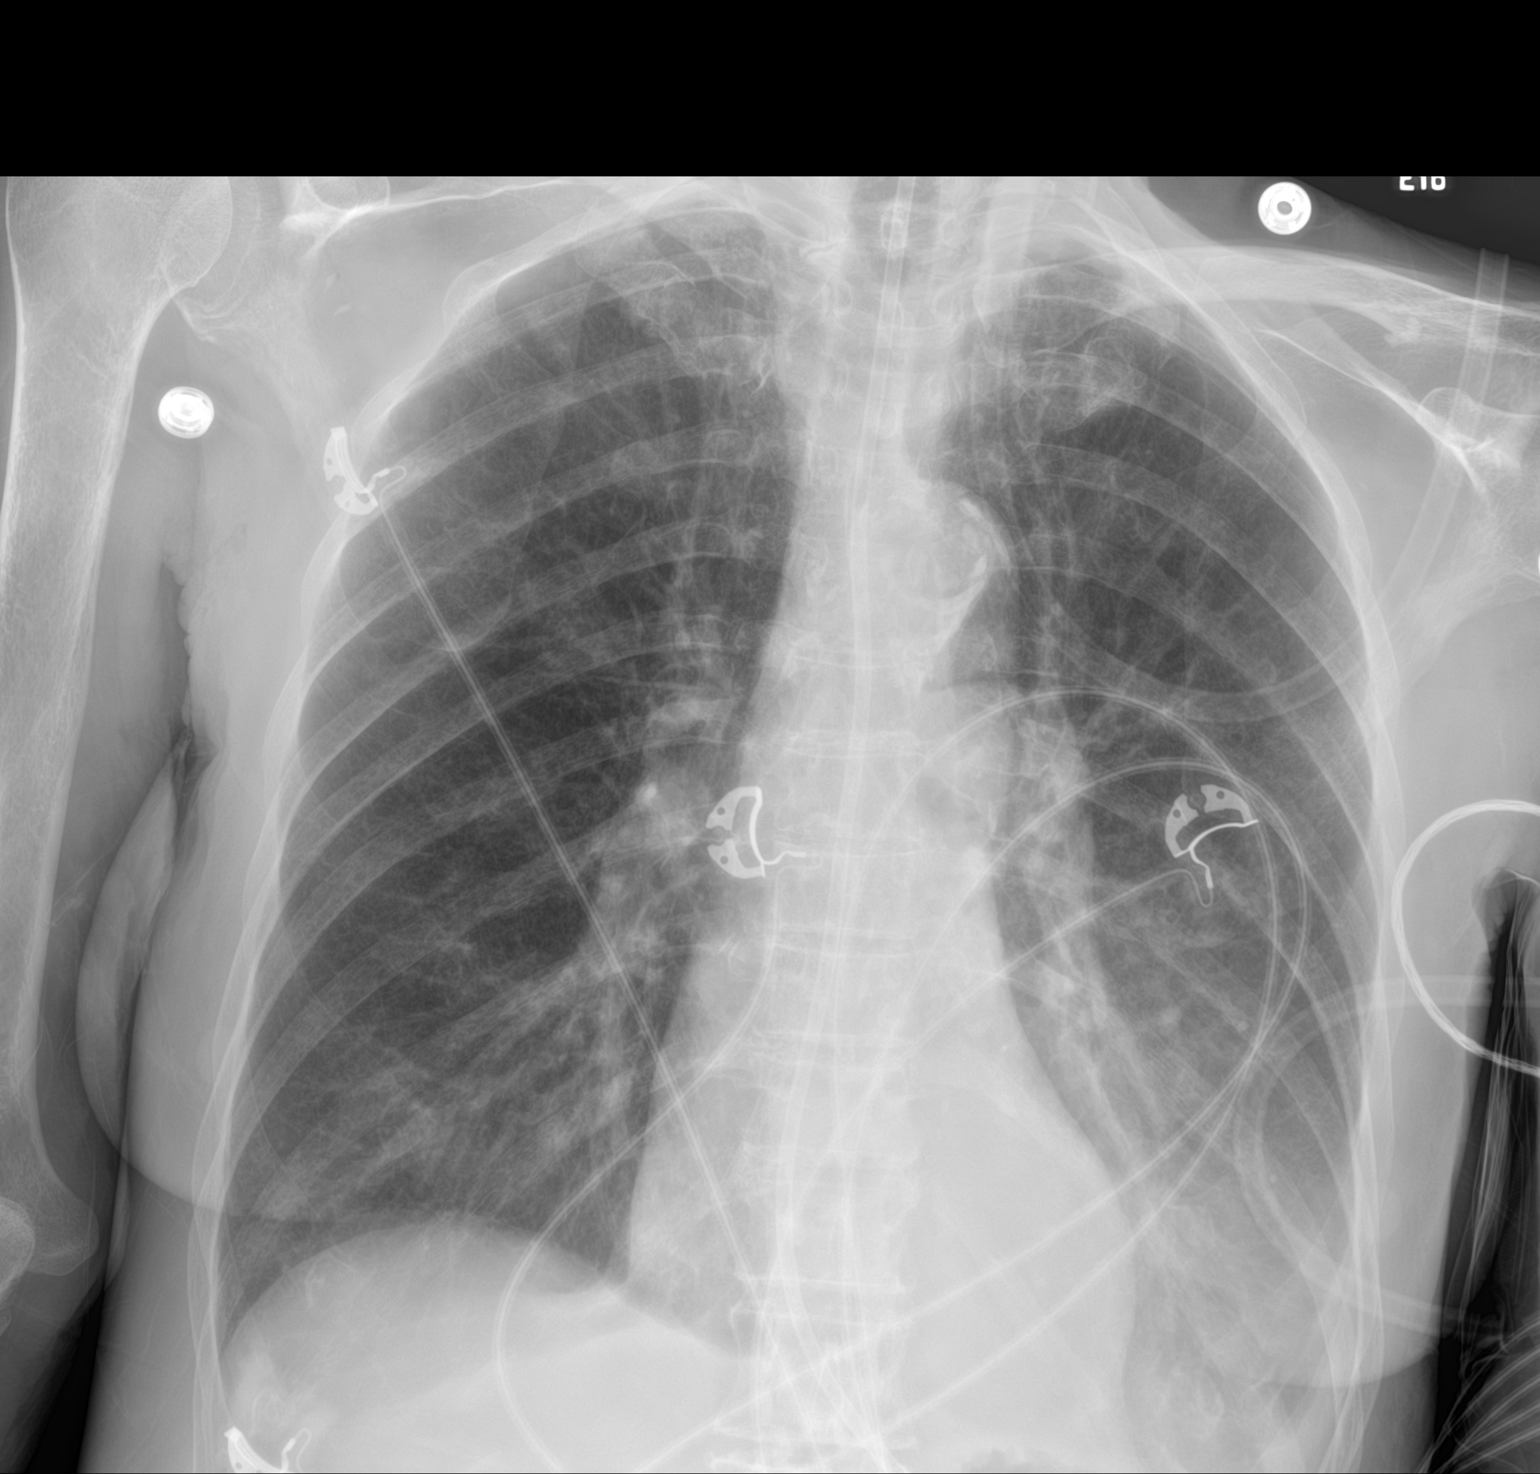

[1 of 1 positions shown; findings below may reference images not displayed]

FINDINGS: The heart size and mediastinal contours are within normal limits. No
pneumothorax is noted. Feeding tube is seen entering stomach. Right
lung is clear. Increased left basilar atelectasis or infiltrate is
noted with associated small pleural effusion. The visualized
skeletal structures are unremarkable.
IMPRESSION: Increased left basilar atelectasis or infiltrate is noted with
associated small pleural effusion.

## 2020-06-03 MED ORDER — METOPROLOL TARTRATE 25 MG PO TABS
25.0000 mg | ORAL_TABLET | Freq: Two times a day (BID) | ORAL | Status: DC
  Filled 2020-06-03: qty 1

## 2020-06-03 MED ORDER — NOREPINEPHRINE 4 MG/250ML-% IV SOLN
0.0000 ug/min | INTRAVENOUS | Status: DC
  Administered 2020-06-03: 32 ug/min via INTRAVENOUS
  Administered 2020-06-03: 28 ug/min via INTRAVENOUS
  Administered 2020-06-03: 2 ug/min via INTRAVENOUS
  Administered 2020-06-03: 30 ug/min via INTRAVENOUS
  Filled 2020-06-03 (×4): qty 250

## 2020-06-03 MED ORDER — METOPROLOL TARTRATE 25 MG PO TABS: 25.0000 mg | ORAL_TABLET | Freq: Two times a day (BID) | ORAL | Status: DC

## 2020-06-03 MED ORDER — IOHEXOL 350 MG/ML SOLN
75.0000 mL | Freq: Once | INTRAVENOUS | Status: AC | PRN
  Administered 2020-06-03: 75 mL via INTRAVENOUS

## 2020-06-03 MED ORDER — IPRATROPIUM BROMIDE 0.02 % IN SOLN
0.5000 mg | Freq: Four times a day (QID) | RESPIRATORY_TRACT | Status: DC
  Administered 2020-06-03 – 2020-06-04 (×6): 0.5 mg via RESPIRATORY_TRACT
  Filled 2020-06-03 (×6): qty 2.5

## 2020-06-03 MED ORDER — NITROFURANTOIN 25 MG/5ML PO SUSP
50.0000 mg | Freq: Three times a day (TID) | ORAL | Status: DC
  Administered 2020-06-03 – 2020-06-04 (×5): 50 mg
  Filled 2020-06-03 (×9): qty 10

## 2020-06-03 MED ORDER — SODIUM CHLORIDE 0.9% FLUSH
10.0000 mL | Freq: Two times a day (BID) | INTRAVENOUS | Status: DC
  Administered 2020-06-03: 10 mL

## 2020-06-03 MED ORDER — SODIUM CHLORIDE 0.9% FLUSH: 10.0000 mL | INTRAVENOUS | Status: DC | PRN

## 2020-06-03 NOTE — Progress Notes (Signed)
NAME:  Alexandra Mays, MRN:  [id number], DOB:  Aug 05, 1950, LOS: 14 ADMISSION DATE:  05/20/2020, CONSULTATION DATE: 05/21/2020 REFERRING MD: Dr. 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 Lives with granddaughter, but is able to drive, shop, and give childcare.   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   Significant Hospital Events:   Has remained in ICU on vasospasm watch.  TCD's have been normal although patient continues to be fairly somnolent. Follow-up CT scan 3/18 and MRI small age-indeterminate strokes but no convincing evidence of vasospasm 3/21 sent back to ICU for somnolence, EEG neg, stable ventriculomegaly on CT, UA + for infection, sodium low  Interim History / Subjective:  Does not seem to do well with hypertonic saline. She is still very sleepy.  Objective    Blood pressure 131/70, pulse 89, temperature 98.2 F (36.8 C), temperature source Axillary, resp. rate (!) 28, height 5\' 4"  (1.626 m), weight 42 kg, SpO2 95 %.    FiO2 (%):  [28 %] 28 %   Intake/Output Summary (Last 24 hours) at 06/03/2020 0748 Last data filed at 06/03/2020 0600 Gross per 24 hour  Intake 1762.34 ml  Output 1200 ml  Net 562.34 ml   Filed Weights   06/01/20 0500 06/02/20 0500 06/03/20 0500  Weight: 42.1 kg 42 kg 42 kg    Examination: Constitutional: somnolent woman lying in bed  Eyes: pupils equal when I can pry her eyelids open Ears, nose, mouth, and throat: MM dry Cardiovascular: RRR, ext warm Respiratory: clear, no wheezing or accessory muscle use Gastrointestinal: soft, +BS Skin: No rashes, normal turgor Neurologic: She seems to have left hemineglect and weakness now that I can get more of a neuro exam.  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  Improved.  Per RN, we actually may be approaching how she was prior to transfer out of ICU; wonder if the hydrocephalus is playing a role.  For now would just continue supportive care and prevent hyponatremia as able. 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  Cortisol/TSH okay. - Continue modafenil - Will need to consider vaptan  vs. Demeclocycline if sodium goes down again  Ischemic chronic systolic congestive heart failure, EF 20 to 25% - home  statin, losartan, carvedilol currently on hold.  - watch for s/s of fluid overload; none at present - start low dose bb; will need entresto at some point  Pulmonary toileting issues leading to acute hypoxemic respiratory failure - CPT and hypertonic, doing better with this  Malnutrition - Tube feeds ongoing  Tachypnea, AMS, at risk for intubation- I still do not think we are out of woods here - Continue ICU monitoring - Check CXR, consider diuresis - DC hypertonic saline - Continue CPT  COPD, not in exacerbation - duonebs, budesonide nebs in place of home breo, incruse.    Dementia - mild at baseline. Still drives, cooks, childcare.   UTI - staph epidermidis and e faecalis; maybe this explains lack of improvement? - Switch ceftriaxone to nitrofurantoin x 7 days - follow WBC/fever curve - continue foley given severe dysuria  Best practice (evaluated daily)  Diet: Tube feeds Pain/Anxiety/Delirium protocol (if indicated): Tylenol PRN VAP protocol (if indicated): N/A DVT prophylaxis: SCDs GI prophylaxis: Famotidine Glucose control: SSI Mobility: As tolerated Disposition: would like to see her a little less tachypneic before sending out  Goals of Care:  Last date of multidisciplinary goals of care discussion: 3/22 with grandaughter, nurse, NP, and myself present Code Status: Full code. Patient would not want long term life support. But a time limited trial on intubation would be ok if indicated.   Patient critically ill due to acute metabolic encephalopathy, hypoxemic respiratory failure Interventions to address this today potential diuresis, abx titration, monitoring for intubation need Risk of deterioration without these interventions is high  I personally spent 33 minutes providing critical care not including any separately billable procedures  Myrla Halsted MD [facility]  Prefer epic messenger for cross cover needs If after hours, please call  E-link

## 2020-06-03 NOTE — Progress Notes (Signed)
SLP Cancellation Note  Patient Details Name: Alexandra Mays MRN: [id number] DOB: [birth year]   Cancelled treatment:        Pt sleeping on arrival. Lynnea Ferrier, pt's granddaughter reported she continues to be lethargic and  Confused. Continue attempts   Royce Macadamia 06/03/2020, 1:11 PM   Breck Coons Lonell Face.Ed Nurse, children's [phone] Office [phone]

## 2020-06-03 NOTE — Progress Notes (Signed)
Peripherally Inserted Central Catheter Placement  The IV Nurse has discussed with the patient and/or persons authorized to consent for the patient, the purpose of this procedure and the potential benefits and risks involved with this procedure.  The benefits include less needle sticks, lab draws from the catheter, and the patient may be discharged home with the catheter. Risks include, but not limited to, infection, bleeding, blood clot (thrombus formation), and puncture of an artery; nerve damage and irregular heartbeat and possibility to perform a PICC exchange if needed/ordered by physician.  Alternatives to this procedure were also discussed.  Bard Power PICC patient education guide, fact sheet on infection prevention and patient information card has been provided to patient /or left at bedside.   PICC Placement Documentation  PICC Double Lumen 06/03/20 PICC Right Basilic 33 cm 0 cm (Active)  Indication for Insertion or Continuance of Line Vasoactive infusions 06/03/20 1552  Exposed Catheter (cm) 0 cm 06/03/20 1552  Site Assessment Clean;Dry;Intact 06/03/20 1552  Lumen #1 Status Flushed;Saline locked;Blood return noted 06/03/20 1552  Lumen #2 Status Flushed;Saline locked;Blood return noted 06/03/20 1552  Dressing Type Transparent;Securing device 06/03/20 1552  Dressing Status Clean;Dry;Intact;New drainage 06/03/20 1552  Antimicrobial disc in place? Yes 06/03/20 1552  Dressing Intervention New dressing 06/03/20 1552  Dressing Change Due 06/10/20 06/03/20 1552   Already bruised on the lateral distal and lower side of the RUA near the PICC site. Consent signed by grandaughter, Cherie Ouch, via telephone verified by 2 PICC RNs.    Annett Fabian 06/03/2020, 3:53 PM

## 2020-06-03 NOTE — Progress Notes (Signed)
OT Cancellation Note  Patient Details Name: Alexandra Mays MRN: [id number] DOB: 05-28-1950   Cancelled Treatment:    Reason Eval/Treat Not Completed: Medical issues which prohibited therapy (vasospasming. Starting levophed. Hold for now. Will return as schedule allows.)  Chadwick Reiswig M Nusrat Encarnacion Analyce Tavares MSOT, OTR/L Acute Rehab Pager: [phone] Office: [phone] 06/03/2020, 2:31 PM

## 2020-06-03 NOTE — Care Plan (Signed)
06/03/2020   Dr. Kathyrn Sheriff met with patient at bedside. Discussed findings of vasospasm on CTA. Also described overall languishing, other comorbidities and patient's previously stated wishes of not wanting to be in a nursing home and valuing her independence.  We discussed interventions including EVD (hydrocephalus less likely causing ongoing coma), intubation, chest compressions, shocks and we all agree Alexandra Mays would probably not want those aggressive likely painful procedures in these circumstances.  However, given potentially reversible underlying causes of encephalopathy (incompletely treated UTI, vasospasm), we agreed that a PICC and BP push is reasonable in addition to antibiotics.  If Alexandra Mays declines clinically despite this, we will transition to full comfort care.  Erskine Emery MD PCCM

## 2020-06-03 NOTE — Progress Notes (Signed)
PT Cancellation Note  Patient Details Name: Alexandra Mays MRN: [id number] DOB: [birth year]   Cancelled Treatment:    Reason Eval/Treat Not Completed: Medical issues which prohibited therapy this afternoon. Per RN, pt in vasospasm, just started levophed, asked PT to hold until pt more stable.   Rolm Baptise, PT, DPT   Acute Rehabilitation Department Pager #: [phone]   Gaetana Michaelis 06/03/2020, 2:32 PM

## 2020-06-03 NOTE — Progress Notes (Signed)
OT Cancellation Note  Patient Details Name: Alexandra Mays MRN: [id number] DOB: [birth year]   Cancelled Treatment:    Reason Eval/Treat Not Completed: Medical issues which prohibited therapy (on BiPAP. Awaiting CTA. Will return as schedule allows. Thank you.)  Braxtin Bamba M [name] [name] MSOT, OTR/L Acute Rehab Pager: [phone] Office: [phone] 06/03/2020, 8:18 AM

## 2020-06-03 NOTE — Progress Notes (Signed)
Updated pts daughter this AM.  Over the past couple days the pt has yelled out stating "I can't breathe" after receiving the Hypertonic and Duoneb combination around 1000 and 2000.  The pt afterwards slightly decompensates into 80s and may become tachycardic and anxious.  Spoke with RT about these events.  Will pass on to RN and make rounding MD aware.

## 2020-06-03 NOTE — Progress Notes (Signed)
  NEUROSURGERY PROGRESS NOTE   Event reviewed overnight. Patient resting comfortably. Son at bedside  EXAM:  BP 131/70   Pulse 89   Temp 98.2 F (36.8 C) (Axillary)   Resp (!) 28   Ht 5\' 4"  (1.626 m)   Wt 42 kg   SpO2 95%   BMI 15.89 kg/m   Eyes closed. PERRL Requires significant stimulation for responses Mostly incomprehensible speech. Occasionally says "no" Did move RLE to command, but required significant stimulation. W/D other extremities to pain  Date POD PCO2 HCT BP   MCA ACA PCA OPHT SIPH VERT Basilar            Right  Left                                                     3/14 MR         Right  Left   55  41   -52  -33   38  29   21  17   17  24    -23  -27   -24       3/16 GC         Right  Left   72  38   -18  -42   40  24   20  16   30  24    -20  -25   -20       3/18 MR           Right  Left   *  34   *  -39   21  21   25  25    40  *   -26  -26   -31       3/21RH         Right  Left   91  47   -28  -50   36  -30   21  20    -38  32   -26  -22   *       3/23,rs         Right  Left   144  187   -35  -77   73  72   36  42   58  63   -32  -40   *           IMPRESSION/PLAN 70 y.o. female SAH d#15 s/p RPcom coiling. Not as responsive this am.  - ?vasospam bilateral MCAs. Unusual as she is 15 days out and all TCDs have been normal otherwise. Will get CTA to see if she truly has spasm - continue abx for UTI per CCM. - Hyponatremia. Resolved. Continue to monitor - continue supportive care, keppra, nimotop

## 2020-06-04 ENCOUNTER — Inpatient Hospital Stay (HOSPITAL_COMMUNITY): Payer: Medicare Other

## 2020-06-04 DIAGNOSIS — I609 Nontraumatic subarachnoid hemorrhage, unspecified: Secondary | ICD-10-CM

## 2020-06-04 DIAGNOSIS — I671 Cerebral aneurysm, nonruptured: Secondary | ICD-10-CM

## 2020-06-04 LAB — BASIC METABOLIC PANEL
Anion gap: 7 (ref 5–15)
BUN: 19 mg/dL (ref 8–23)
CO2: 25 mmol/L (ref 22–32)
Calcium: 8.5 mg/dL — ABNORMAL LOW (ref 8.9–10.3)
Chloride: 98 mmol/L (ref 98–111)
Creatinine, Ser: 0.46 mg/dL (ref 0.44–1.00)
GFR, Estimated: >60 mL/min (ref >60)
Glucose, Bld: 175 mg/dL — ABNORMAL HIGH (ref 70–99)
Potassium: 3.7 mmol/L (ref 3.5–5.1)
Sodium: 130 mmol/L — ABNORMAL LOW (ref 135–145)

## 2020-06-04 LAB — CBC
HCT: 34.1 % — ABNORMAL LOW (ref 36.0–46.0)
Hemoglobin: 11.9 g/dL — ABNORMAL LOW (ref 12.0–15.0)
MCH: 32.2 pg (ref 26.0–34.0)
MCHC: 34.9 g/dL (ref 30.0–36.0)
MCV: 92.2 fL (ref 80.0–100.0)
Platelets: 392 10*3/uL (ref 150–400)
RBC: 3.7 MIL/uL — ABNORMAL LOW (ref 3.87–5.11)
RDW: 14.8 % (ref 11.5–15.5)
WBC: 13.9 10*3/uL — ABNORMAL HIGH (ref 4.0–10.5)
nRBC: 0 % (ref 0.0–0.2)

## 2020-06-04 LAB — URINE CULTURE: Culture: >=100000 — AB

## 2020-06-04 LAB — GLUCOSE, CAPILLARY
Glucose-Capillary: 168 mg/dL — ABNORMAL HIGH (ref 70–99)
Glucose-Capillary: 174 mg/dL — ABNORMAL HIGH (ref 70–99)

## 2020-06-04 LAB — MAGNESIUM: Magnesium: 1.9 mg/dL (ref 1.7–2.4)

## 2020-06-04 LAB — SODIUM: Sodium: 128 mmol/L — ABNORMAL LOW (ref 135–145)

## 2020-06-04 LAB — PHOSPHORUS: Phosphorus: 3.7 mg/dL (ref 2.5–4.6)

## 2020-06-04 MED ORDER — VASOPRESSIN 20 UNITS/100 ML INFUSION FOR SHOCK
0.0000 [IU]/min | INTRAVENOUS | Status: DC
  Administered 2020-06-04: 0.03 [IU]/min via INTRAVENOUS
  Filled 2020-06-04: qty 100

## 2020-06-04 MED ORDER — NOREPINEPHRINE 16 MG/250ML-% IV SOLN
0.0000 ug/min | INTRAVENOUS | Status: DC
  Administered 2020-06-04: 40 ug/min via INTRAVENOUS

## 2020-06-04 MED ORDER — TOLVAPTAN 15 MG PO TABS
15.0000 mg | ORAL_TABLET | Freq: Once | ORAL | Status: DC
  Filled 2020-06-04: qty 1

## 2020-06-04 MED ORDER — MORPHINE SULFATE (PF) 4 MG/ML IV SOLN: 4.0000 mg | INTRAVENOUS | Status: DC | PRN

## 2020-06-04 MED ORDER — MORPHINE SULFATE (PF) 2 MG/ML IV SOLN
1.0000 mg | INTRAVENOUS | Status: DC | PRN
  Administered 2020-06-04: 1 mg via INTRAVENOUS
  Filled 2020-06-04: qty 1

## 2020-06-04 MED ORDER — LEVETIRACETAM 100 MG/ML PO SOLN
500.0000 mg | Freq: Two times a day (BID) | ORAL | 12 refills | Status: AC
Start: 2020-06-04 — End: ?

## 2020-06-04 NOTE — Progress Notes (Signed)
SLP Cancellation Note  Patient Details Name: Alexandra Mays MRN: [id number] DOB: 21-Dec-1950   Cancelled treatment:    Pt has transitioned to comfort care and is going home with hospice tonight.  Our service will respectfully sign off.  Alexandra Sebastiani L. Samson Frederic, MA CCC/SLP Acute Rehabilitation Services Office number [phone] Pager [phone]        Carolan Shiver 06/04/2020, 6:37 PM

## 2020-06-04 NOTE — Progress Notes (Signed)
  NEUROSURGERY PROGRESS NOTE   Received call from nursing that patient will be transitioning to comfort care and going home with hospice. Will d/c all neurosurg orders. Would rec continuing keppra at discharge. No need for nimotop.

## 2020-06-04 NOTE — Progress Notes (Signed)
CPT not done at this time per family request. States the patient does not like it.

## 2020-06-04 NOTE — Progress Notes (Signed)
TCD has been completed.   Preliminary results in CV Proc.   Blanch Media 06/04/2020 11:40 AM

## 2020-06-04 NOTE — Progress Notes (Signed)
eLink Physician-Brief Progress Note Patient Name: SOLENE HEREFORD DOB: 1951/02/23 MRN: [id number]   Date of Service  06/04/2020  HPI/Events of Note  Patient with SAH induced vasospasm on hypertensive therapy with Levophed but SBP target is unmet (current BP 157-165/ 75.  eICU Interventions  Will add vasopressin at rate of 0.03 mcg.        Thomasene Lot Elai Vanwyk 06/04/2020, 5:01 AM

## 2020-06-04 NOTE — TOC Transition Note (Signed)
Transition of Care [facility]) - CM/SW Discharge Note   Patient Details  Name: Alexandra Mays MRN: [id number] Date of Birth: November 17, 1950  Transition of Care [facility]) CM/SW Contact:  Mearl Latin, LCSW Phone Number: 06/04/2020, 3:55 PM   Clinical Narrative:    Patient will DC to: Home Anticipated DC date: 06/04/20 Family notified: Granddaughter, [name] Transport by: Sharin Mons (about 3 hours)   Per MD patient ready for DC to home. RN, patient, patient's family, and hospice notified of DC. DC packet on chart. Ambulance transport requested for patient.   CSW will sign off for now as social work intervention is no longer needed. Please consult [place] again if new needs arise.      Final next level of care: Home w Hospice Care Barriers to Discharge: Barriers Resolved   Patient Goals and CMS Choice Patient states their goals for this hospitalization and ongoing recovery are:: Rehab CMS Medicare.gov Compare Post Acute Care list provided to:: Patient Represenative (must comment) Choice offered to / list presented to :  (Granddaughter)  Discharge Placement                Patient to be transferred to facility by: PTAR Name of family member notified: [name] Patient and family notified of of transfer: 06/04/20  Discharge Plan and Services In-house Referral: Clinical Social Work   Post Acute Care Choice: Skilled Nursing Facility                      [facility] Agency: [facility] Date [facility] Agency Contacted: 06/04/20 Time HH Agency Contacted: 1010 Representative spoke with at [facility] Agency: Cheri  Social Determinants of Health (SDOH) Interventions     Readmission Risk Interventions No flowsheet data found.

## 2020-06-04 NOTE — TOC Progression Note (Signed)
Transition of Care [facility]) - Progression Note    Patient Details  Name: Alexandra Mays MRN: [id number] Date of Birth: 07/10/1950  Transition of Care [facility]) CM/SW Contact  Mearl Latin, LCSW Phone Number: 06/04/2020, 10:12 AM  Clinical Narrative:    CSW received consult for hospice at home. CSW spoke with patient's granddaughter. She confirmed plan and is agreeable to [facility] since they service [facility]. CSW called referral in to [facility]. They will contact granddaughter to discuss equipment needs; likely will require hospital bed. Cortrak will need to be removed; ok for foley to remain. CSW will follow up once clearance from Hospice is received.    Expected Discharge Plan: Skilled Nursing Facility Barriers to Discharge: Continued Medical Work up  Expected Discharge Plan and Services Expected Discharge Plan: Skilled Nursing Facility In-house Referral: Clinical Social Work   Post Acute Care Choice: Skilled Nursing Facility Living arrangements for the past 2 months: Single Family Home Expected Discharge Date: 06/04/20                           [facility] Agency: [facility] Date HH Agency Contacted: 06/04/20 Time HH Agency Contacted: 1010 Representative spoke with at [facility] Agency: Cheri   Social Determinants of Health (SDOH) Interventions    Readmission Risk Interventions No flowsheet data found.

## 2020-06-04 NOTE — Progress Notes (Signed)
PT Cancellation Note  Patient Details Name: Alexandra Mays MRN: [id number] DOB: 1950/07/18   Cancelled Treatment:    Reason Eval/Treat Not Completed: Patient not medically ready, and per RN, pt transitioning to comfort at this time. Will cancel PT orders, but please re-consultif change in status.   Deland Pretty, DPT   Acute Rehabilitation Department Pager #: [phone]   Gaetana Michaelis 06/04/2020, 8:36 AM

## 2020-06-04 NOTE — Progress Notes (Signed)
NAME:  Alexandra Mays, MRN:  [id number], DOB:  [birth year], LOS: 15 ADMISSION DATE:  05/20/2020, CONSULTATION DATE: 05/21/2020 REFERRING MD: Dr. Conchita Mays, CHIEF COMPLAINT: Headache and neck pain  Brief History:  70 year old female with COPD and recent NSTEMI who presented with sudden onset of headache and neck pain for few days, noted to have subarachnoid hemorrhage H/H 2, MF 3 due to ruptured right P-comm aneurysm, status post coiling  Of note, her recent baseline is independent. Lives with granddaughter, but is able to drive, shop, and give childcare.   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   Significant Hospital Events:   Has remained in ICU on vasospasm watch.  TCD's have been normal although patient continues to be fairly somnolent. Follow-up CT scan 3/18 and MRI small age-indeterminate strokes but no convincing evidence of vasospasm 3/21 sent back to ICU for somnolence, EEG neg, stable ventriculomegaly on CT, UA + for infection, sodium low  Interim History / Subjective:  Not much changed in terms of mental status so far despite MAP push.  Objective   Blood  pressure (!) 165/78, pulse 92, temperature 100 F (37.8 C), temperature source Oral, resp. rate (!) 32, height 5\' 4"  (1.626 m), weight 46.6 kg, SpO2 95 %.        Intake/Output Summary (Last 24 hours) at 06/04/2020 0710 Last data filed at 06/04/2020 0600 Gross per 24 hour  Intake 2383.88 ml  Output 1795 ml  Net 588.88 ml   Filed Weights   06/02/20 0500 06/03/20 0500 06/04/20 0300  Weight: 42 kg 42 kg 46.6 kg    Examination: Constitutional: cachetic elderly woman lying in bed  Eyes: pupils equal, does not track Ears, nose, mouth, and throat: MM dry, NGT in place Cardiovascular: RRR, ext warm Respiratory: tachypneic, mild, retained pharyngeal secretions causing transmitted upper airway sounds Gastrointestinal: soft, +BS Skin: No rashes, normal turgor Neurologic: she withdraws x 4, occasionally follows commands for her granddaughter with enough prompting Psychiatric: cannot assess  Sodium down again CXR personally reviewed, ?aspiration vs. 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  Cortisol/TSH okay. Ischemic chronic systolic congestive heart failure, EF 20 to 25% Acute encephalopathy: some combination UTI, hyponatremia, hypoactive ICU delirium.  Was improved with hyponatremia correction and abx then developed asospasm Vasospasm- started 3/24 SAH associated SIADH- will be ongoing issue; mentation does seem a bit better when sodium is > 130.   - Continue modafenil, nimodipine - Start tolvaptan protocol - Continue MAP push SBP > 170 with vasopressin/levophed - PT/OT/SLP as able, she needs a lot of repeated stimulation for this  Ischemic cardiomyopathy - home statin, losartan, carvedilol currently on hold.  - watch for s/s of fluid overload; none at present  Pulmonary  toileting issues leading to  acute hypoxemic respiratory failure Tachypnea, AMS, at risk for intubation Question LLL effusion vs. aspiraiton - Gets worse with hypertonic, keep working on CPT  Malnutrition - Tube feeds ongoing  COPD, not in exacerbation - duonebs, budesonide nebs in place of home breo, incruse.    Dementia - mild at baseline. Still drives, cooks, childcare.   Staph epidermidis and E faecalis UTI  - Nitrofurantoin x 7 days - follow WBC/fever curve - continue bethenachol - continue foley given severe dysuria and vaptan use  GOC- see note 3/24 - No plans for EVD - Continue current management, if deterioration, switch to comfort - DNR  Best practice (evaluated daily)  Diet: Tube feeds Pain/Anxiety/Delirium protocol (if indicated): Tylenol PRN VAP protocol (if indicated): N/A DVT prophylaxis: SCDs GI prophylaxis: Famotidine Glucose control: SSI Mobility: As tolerated Disposition: ICU  Goals of Care:  See GOC above  Patient critically ill due to acute metabolic encephalopathy, hypoxemic respiratory failure Interventions to address this today potential diuresis, abx titration, monitoring for intubation need Risk of deterioration without these interventions is high  I personally spent 33 minutes providing critical care not including any separately billable procedures  Alexandra Halsted MD Alexandra Mays Pulmonary Critical Care  Prefer epic messenger for cross cover needs If after hours, please call E-link    Addendum: Granddaughter arrived to bedside and after thinking about it through the night and her prior discussions with [name], she wants to bring her home with hospice.  Will notify NSGY and place home hospice consult.  Also will ask palliative to see.  Alexandra Halsted MD PCCM

## 2020-06-04 NOTE — Progress Notes (Signed)
  NEUROSURGERY PROGRESS NOTE   Saw patient and met with granddaughter at bedside. Pt remains largely unchanged, occasionally nods to questions or responds with mono-syllabic answers but remains very somnolent, no eye opening. Has spontaneous purposeful movement of all extremities.  Pts granddaughter tells me that after thinking about the situation overnight and knowing her GM's wishes, she has elected to change goal of care to comfort measures and would like to take her home with home hospice. In this situation I think this is a reasonable decision. Will get CSW and hospice consult.  Consuella Lose, MD [facility]

## 2020-06-04 NOTE — Progress Notes (Signed)
°  NEUROSURGERY PROGRESS NOTE   I had a lengthy conversation with the patient's granddaughter with Dr. Katrinka Blazing (PCCM) present. We discussed the current situation with the patient's depressed level of consciousness likely a result of a combination of partially treated UTI, new vasospasm, resolved hyponatremia, and atelectasis v infiltrate in the setting of multiple other medical comorbidities and somewhat advanced age. While some of these issues may be reversible or self-limited (UTI, vasospasm), the likelihood of return to independence given her age, subarachnoid hemorrhage and current condition is unlikely. Pts granddaughter knew that Mrs. Soliday would not want to be intubated or have life-prolonging measures instituted if there wasn't a reasonable chance of return to independence. She also knew she would not want to need placement in a nursing home. We therefore decided that we will change code status to DNR/DNI and would not attempt EVD placement. We can cont to treat medically including abx and vasopressors in an attempt to treat UTI and spasm.   Lisbeth Renshaw, MD [facility]

## 2020-06-04 NOTE — Progress Notes (Signed)
OT Cancellation Note  Patient Details Name: Alexandra Mays MRN: [id number] DOB: 1950/09/04   Cancelled Treatment:    Reason Eval/Treat Not Completed: Other (comment) (Per RN, pt going comfort care. Will DC orders. Please reorder is status changes. Thank you.)  Carsynn Bethune M Ansley Stanwood Cliffton Spradley MSOT, OTR/L Acute Rehab Pager: [phone] Office: [phone] 06/04/2020, 8:37 AM

## 2020-06-04 NOTE — Progress Notes (Signed)
VAST consulted to remove PICC per MD order. Removed RA DL PICC per protocol. Manual pressure applied for 5 mins. Vaseline gauze, gauze and Tegaderm applied over site. No bleeding or swelling noted. Pt's arm ecchymotic above and below AC prior to removal of line.

## 2020-06-10 NOTE — Discharge Summary (Signed)
  Physician Discharge Summary  Patient ID: Alexandra Mays MRN: [id number] DOB/AGE: 04/26/1950 70 y.o.  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 femaleWho presented to the emergency department on 05/20/2020 after sudden onset headache and neck pain times several days.  She underwent workup by ED P and was found to have subarachnoid hemorrhage secondary to a ruptured right PCOM aneurysm.  She was admitted to the neuro ICU for further management and monitoring.  She underwent coil embolization of the right PCOM aneurysm on 03/10.  While she was initially doing well postoperatively, patient started to become more lethargic and essentially unresponsive on day 14. She underwent workup and was found to have a urinary tract infection as well as hyponatremia.  She was on also noted to have ventriculomegaly on head CT but this was stable when compared to admission and thought not to be the cause of her depressed mental status.  She was treated with antibiotics and 3% hypertonic saline. She started to  Shows subtle improvements, although not significant.  She was then noted  To have cerebral vasospasms.  She was treated with triple H therapy.   Unfortunately she continued to be rather unresponsive.  A goals of care discussion was held with the family.  Treatment options were discussed including trial of placement of external ventricular drain.  After a long family discussion, the family decided that the patient would not want further treatment including EVD placement, intubation.  Family decided to   Transition to full comfort care measures and take the patient home with hospice care. She was discharged on 06/10/2020 with home hospice.  Treatments: Surgery - coiling right PCOM  Discharge Exam: Blood pressure 126/63, pulse 82, temperature 100.1 F (37.8 C), temperature source Axillary, resp. rate 18, height 5\' 4"  (1.626 m), weight 46.6 kg, SpO2 95 %.  Disposition: Discharge disposition: 50-Hospice/Home       Discharge Instructions    No wound care   Complete by: As directed      Allergies as of 06/04/2020      Reactions   No Known Allergies       Medication List    STOP taking these medications   albuterol (2.5 MG/3ML) 0.083% nebulizer solution Commonly known as: PROVENTIL   albuterol 108 (90 Base) MCG/ACT inhaler Commonly known as: VENTOLIN HFA   Breo Ellipta 100-25 MCG/INH Aepb Generic drug: fluticasone furoate-vilanterol   carvedilol 3.125 MG tablet Commonly known as: COREG   ibuprofen 200 MG tablet Commonly known as: ADVIL   losartan 25 MG tablet Commonly known as: COZAAR   multivitamin with minerals Tabs tablet   polyethylene glycol 17 g packet Commonly known as: MIRALAX / GLYCOLAX   predniSONE 20 MG tablet Commonly known as: DELTASONE   simvastatin 20 MG tablet Commonly known as: ZOCOR   Tiotropium Bromide Monohydrate 2.5 MCG/ACT Aers     TAKE these medications   levETIRAcetam 100 MG/ML solution Commonly known as: KEPPRA Place 5 mLs (500 mg total) into feeding tube 2 (two) times daily.        Signed: 06/06/2020 Custer Pimenta 06/10/2020, 7:52 AM

## 2020-07-11 DEATH — deceased
